# Patient Record
Sex: Female | Born: 1990
Health system: Southern US, Community
[De-identification: ages and names within clinical notes are randomized; demographics above are authoritative.]

## PROBLEM LIST (undated history)

## (undated) ENCOUNTER — Inpatient Hospital Stay: Payer: Self-pay

## (undated) DIAGNOSIS — L509 Urticaria, unspecified: Secondary | ICD-10-CM

## (undated) DIAGNOSIS — F329 Major depressive disorder, single episode, unspecified: Secondary | ICD-10-CM

## (undated) DIAGNOSIS — F410 Panic disorder [episodic paroxysmal anxiety] without agoraphobia: Secondary | ICD-10-CM

## (undated) DIAGNOSIS — I080 Rheumatic disorders of both mitral and aortic valves: Secondary | ICD-10-CM

## (undated) DIAGNOSIS — F909 Attention-deficit hyperactivity disorder, unspecified type: Secondary | ICD-10-CM

## (undated) DIAGNOSIS — R002 Palpitations: Secondary | ICD-10-CM

## (undated) DIAGNOSIS — N200 Calculus of kidney: Secondary | ICD-10-CM

## (undated) DIAGNOSIS — I341 Nonrheumatic mitral (valve) prolapse: Secondary | ICD-10-CM

## (undated) DIAGNOSIS — F32A Depression, unspecified: Secondary | ICD-10-CM

## (undated) DIAGNOSIS — J45909 Unspecified asthma, uncomplicated: Secondary | ICD-10-CM

## (undated) DIAGNOSIS — F419 Anxiety disorder, unspecified: Secondary | ICD-10-CM

## (undated) HISTORY — DX: Panic disorder (episodic paroxysmal anxiety): F41.0

## (undated) HISTORY — PX: TYMPANOSTOMY TUBE PLACEMENT: SHX32

## (undated) HISTORY — DX: Palpitations: R00.2

## (undated) HISTORY — DX: Rheumatic disorders of both mitral and aortic valves: I08.0

## (undated) HISTORY — DX: Calculus of kidney: N20.0

## (undated) HISTORY — PX: WISDOM TOOTH EXTRACTION: SHX21

## (undated) HISTORY — DX: Major depressive disorder, single episode, unspecified: F32.9

## (undated) HISTORY — DX: Anxiety disorder, unspecified: F41.9

## (undated) HISTORY — DX: Attention-deficit hyperactivity disorder, unspecified type: F90.9

## (undated) HISTORY — DX: Urticaria, unspecified: L50.9

## (undated) HISTORY — DX: Depression, unspecified: F32.A

## (undated) HISTORY — DX: Nonrheumatic mitral (valve) prolapse: I34.1

---
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
## 2009-05-08 ENCOUNTER — Emergency Department: Payer: Self-pay | Admitting: Emergency Medicine

## 2010-03-04 ENCOUNTER — Other Ambulatory Visit: Payer: Self-pay | Admitting: Emergency Medicine

## 2010-08-19 ENCOUNTER — Ambulatory Visit: Payer: Self-pay | Admitting: General Practice

## 2010-10-16 ENCOUNTER — Ambulatory Visit: Payer: Self-pay | Admitting: Cardiology

## 2010-10-29 ENCOUNTER — Ambulatory Visit: Payer: Self-pay | Admitting: Cardiovascular Disease

## 2010-12-20 ENCOUNTER — Emergency Department: Payer: Self-pay | Admitting: Emergency Medicine

## 2011-02-16 ENCOUNTER — Ambulatory Visit: Payer: Self-pay | Admitting: General Practice

## 2011-02-24 ENCOUNTER — Encounter: Payer: Self-pay | Admitting: Cardiovascular Disease

## 2011-02-24 ENCOUNTER — Ambulatory Visit (INDEPENDENT_AMBULATORY_CARE_PROVIDER_SITE_OTHER): Payer: PRIVATE HEALTH INSURANCE | Admitting: Cardiovascular Disease

## 2011-02-24 VITALS — BP 130/76 | HR 84 | Ht 62.0 in | Wt 102.0 lb

## 2011-02-24 DIAGNOSIS — I059 Rheumatic mitral valve disease, unspecified: Secondary | ICD-10-CM

## 2011-02-24 DIAGNOSIS — I341 Nonrheumatic mitral (valve) prolapse: Secondary | ICD-10-CM

## 2011-02-24 DIAGNOSIS — R002 Palpitations: Secondary | ICD-10-CM | POA: Insufficient documentation

## 2011-02-24 DIAGNOSIS — I498 Other specified cardiac arrhythmias: Secondary | ICD-10-CM

## 2011-02-24 DIAGNOSIS — F419 Anxiety disorder, unspecified: Secondary | ICD-10-CM | POA: Insufficient documentation

## 2011-02-24 DIAGNOSIS — I471 Supraventricular tachycardia: Secondary | ICD-10-CM

## 2011-02-24 MED ORDER — PROPRANOLOL HCL 10 MG PO TABS: 10.0000 mg | ORAL_TABLET | Freq: Three times a day (TID) | ORAL | Status: DC | PRN

## 2011-02-24 NOTE — Patient Instructions (Signed)
START Propranolol 10mg  3 times a day AS NEEDED for palpitations. Your physician recommends that you schedule a follow-up appointment in: 2 months (04/2011) with Dr. Elease Hashimoto.

## 2011-02-24 NOTE — Assessment & Plan Note (Signed)
He presents with episodes of palpitations and also some shortness of breath. She had an echocardiogram performed at [facility] which revealed mild mitral valve prolapse with mild mitral regurgitation and mild aortic insufficiency. I have reviewed the echo myself and I think that her valvular insufficiencies are actually very mild. She also has very minimal mitral valve prolapse.  She takes Adderall and drinks a lot of caffeinated beverages. She also eats a lot of chocolate. I think a lot of her palpitations are due to excessive caffeine, Adderall, and sugar. I asked her to decrease her intake of caffeine and the sugar. I've asked her to check with her medical doctor to see if she can use something other than Adderall.  I've written her a prescription for propranolol 10 mg to use on an as-needed basis. We'll check a TSH today.  I'll see her again in several months for a followup visit.

## 2011-02-24 NOTE — Progress Notes (Signed)
Katelyn Whitehead Date of Birth  02-May-1991 [facility] [address]     [address] [place], [place]  [address] [phone]  Fax  [phone]  History of Present Illness:  Episode of SVT 2 years ago.  Has paliptations frequently.   Anxious frequently.   Also has hx of hypokalemia.  Was exercising regularly until this episode of dyspnea - runs 3-4 miles on a treadmill.  Takes Adderall.  Palpitations are worse when she takes adderall.  She also eats lots of chocolate and drinks lots of caffeine.    Current Outpatient Prescriptions  Medication Sig Dispense Refill  . amphetamine-dextroamphetamine (ADDERALL, 10MG ,) 10 MG tablet Take 10 mg by mouth daily.        Marland Kitchen LORazepam (ATIVAN) 0.5 MG tablet Take 0.5 mg by mouth every 8 (eight) hours. Take 1/2 tablet daily       . propranolol (INDERAL) 10 MG tablet Take 1 tablet (10 mg total) by mouth 3 (three) times daily as needed.  30 tablet  12     Allergies  Allergen Reactions  . Sulfa Antibiotics     Rash    PMH:  Mitral valve prolapse  History reviewed. No pertinent past surgical history.  History  Smoking status  . Never Smoker   Smokeless tobacco  . Not on file    History  Alcohol Use No    History reviewed. No pertinent family history.  Reviw of Systems:  Reviewed in the HPI.  All other systems are negative.  Physical Exam: BP 130/76  Pulse 84  Ht 5\' 2"  (1.575 m)  Wt 102 lb (46.267 kg)  BMI 18.66 kg/m2 The patient is alert and oriented x 3.  The mood and affect are normal.   Skin: warm and dry.  Color is normal.    HEENT:   the sclera are nonicteric.  The mucous membranes are moist.  The carotids are 2+ without bruits.  There is no thyromegaly.  There is no JVD.    Lungs: clear.  The chest wall is non tender.    Heart: regular rate with a normal S1 and S2. She has a soft mitral valve click and a very soft systolic murmur. The PMI is not displaced.     Abdomin: good bowel  sounds.  There is no guarding or rebound.  There is no hepatosplenomegaly or tenderness.  There are no masses.   Extremities:  no clubbing, cyanosis, or edema.  The legs are without rashes.  The distal pulses are intact.   Neuro:  Cranial nerves II - XII are intact.  Motor and sensory functions are intact.    The gait is normal.  ECG: Normal sinus rhythm. She has no ST or T wave changes.  Assessment / Plan:

## 2011-03-01 ENCOUNTER — Other Ambulatory Visit: Payer: Self-pay | Admitting: Physician Assistant

## 2011-03-01 ENCOUNTER — Telehealth: Payer: Self-pay | Admitting: Cardiovascular Disease

## 2011-03-01 NOTE — Telephone Encounter (Signed)
Went to work and HR has elevated to the 140's.  Oxygen level was 92, has increased to 98.  Pt states that she had a low grade fever and diarrhea over the weekend.  Pt had her mother pick her up from work.

## 2011-03-01 NOTE — Telephone Encounter (Signed)
Notified patient of symptoms.  She has noticed some problems over the weekend with a fever/virus.  She has felt very bad today with her heart rate in the 140's. She has diarrhea and not feeling very good. Her heart rate now at home is in the 80's.  She is concerned if needs to be seen?

## 2011-03-01 NOTE — Telephone Encounter (Signed)
Notified patient to drink plenty of fluids to keep from getting dehydrated.  Told her it is possible that her heart rate is rapid due to her symptoms. Told patient not to take any of the propanolol because of her blood pressure reading so low. She is going to PCP now and will contact our office if any further symptoms of increased heart rate occur & to let us know what PCP said.

## 2011-03-01 NOTE — Telephone Encounter (Signed)
Over the weekend she had diarrhea fever, pounding in chest blood pressure yesterday 105/73, today 90/60 she had not taken any of the propanolol because of blood pressure running so low. She does feel like can't take a deep breath.  Her temp today is 99.8.  She has an appointment at 4:00 with her pcp. Do you want to see her or wait to find out what pcp said.

## 2011-03-08 ENCOUNTER — Ambulatory Visit: Payer: Self-pay | Admitting: Physician Assistant

## 2011-03-12 ENCOUNTER — Telehealth: Payer: Self-pay | Admitting: *Deleted

## 2011-03-12 ENCOUNTER — Telehealth: Payer: Self-pay | Admitting: Physician Assistant

## 2011-03-12 NOTE — Telephone Encounter (Signed)
Pt had 48hr Holter, has turned it in, very concerned abt results, was told MD to look at it and call her. Will try to facilitate that.

## 2011-03-13 NOTE — Telephone Encounter (Signed)
I do not have access to the holter at this point.  In reviewing the telephone call notes, she has had a viral illness with fever and HR up to 140.  I suspect we will find sinus tachycardia on the monitor.  Agree with holding Propranolol in that circumstance as the tachycardia is due to the viral illness

## 2011-03-15 ENCOUNTER — Telehealth: Payer: Self-pay | Admitting: *Deleted

## 2011-03-15 NOTE — Telephone Encounter (Signed)
Katelyn Whitehead seen in office with Dr. Elease Whitehead 02/24/11 for palpitations, she was started on Propranolol 10mg  tid prn. Katelyn Whitehead unable to take as her SBP is consistently 90 and she states this drops after taking along with symptoms of low BP. She recently had holter done at [facility] by self referral, she states she "wanted to be sure no irregular rhythm was noted." I received report read by Dr. Lady Gary at [facility] with findings: Sinus rhythm with rare PVCs and events associated with sinus tach and SR. He recc she continue Inderal as prescribed.   I have notified Katelyn Whitehead of this report. She is very concerned with her sx of "feel like wind is knocked out of me," and she has sx daily. Katelyn Whitehead has stopped her Adderall and caffeine which was r/t sx at last visit. She has started Celexa for anxiety and states this has helped anxiety, but palpitations continue. I notified her that holter readings were benign, and show no need for intervention, however, will discuss continued symptoms with Dr. Elease Whitehead for any other recommendation. Dr. Elease Whitehead, Katelyn Whitehead has f/u with you in [facility] 05/07/11. Please advise.

## 2011-03-16 NOTE — Telephone Encounter (Signed)
Attempted to contact pt, LMOM TCB.  

## 2011-03-16 NOTE — Telephone Encounter (Signed)
Spoke to pt, notified of below. She will f/u in Sept and will call sooner with any changes.

## 2011-03-16 NOTE — Telephone Encounter (Signed)
I agree with your recommendations.  Holter is benign.  She may try increasing her potassium intake to see if that helps.  I will see her on her apt. In Sept. Or I can see her earlier in our [place] office if needed

## 2011-03-18 NOTE — Telephone Encounter (Signed)
Opened in error

## 2011-03-19 ENCOUNTER — Ambulatory Visit: Payer: Self-pay | Admitting: General Practice

## 2011-04-14 ENCOUNTER — Telehealth: Payer: Self-pay | Admitting: *Deleted

## 2011-04-14 NOTE — Telephone Encounter (Signed)
Pt had left msg on VM regarding recurrent palpitations. Attempted to contact pt, LMOM TCB. She has f/u with Dr. Elease Hashimoto 04/23/11. Will discuss symptoms further when pt returns call.

## 2011-04-19 DIAGNOSIS — I10 Essential (primary) hypertension: Secondary | ICD-10-CM

## 2011-04-23 ENCOUNTER — Ambulatory Visit (INDEPENDENT_AMBULATORY_CARE_PROVIDER_SITE_OTHER): Payer: PRIVATE HEALTH INSURANCE | Admitting: Cardiovascular Disease

## 2011-04-23 ENCOUNTER — Encounter: Payer: Self-pay | Admitting: Cardiovascular Disease

## 2011-04-23 VITALS — BP 100/66 | HR 78 | Ht 62.0 in | Wt 106.0 lb

## 2011-04-23 DIAGNOSIS — R002 Palpitations: Secondary | ICD-10-CM

## 2011-04-23 NOTE — Patient Instructions (Signed)
Drink a can of V8 juice 2-3 times a week.  This will help bring up her potassium level and will also increased the centimeter.  Take a teaspoon of Mylanta every night or every other night for magnesium supplementation.

## 2011-04-23 NOTE — Progress Notes (Signed)
Katelyn Whitehead Date of Birth  [birth year] [facility] [address]     [address] [place], [place]  [address] [phone]  Fax  [phone]  History of Present Illness:  Katelyn Whitehead is a 20 year old female with history of palpitations. I saw her several months ago. She's feeling a little bit better but is still having some episodes of fast heart rate.  Still has occasional episodes where she feels like the breath been  knocked out of her.   Overall she's feeling better than when I last saw her. She has not tried to take a propranolol tablet because her blood pressure has been somewhat. She has been able to do all of her normal activities without any significant problems.  Unfortunately she is not working right now. She's on short-term disability because of her anxiety problems. She's trying some various medications in an effort to control her anxiety.  Current Outpatient Prescriptions on File Prior to Visit  Medication Sig Dispense Refill  . citalopram (CELEXA) 10 MG tablet Take (10 mg) three tablets daily      Xanax as needed  Allergies  Allergen Reactions  . Sulfa Antibiotics     Rash    Past Medical History  Diagnosis Date  . Anxiety   . Palpitations     Past Surgical History  Procedure Date  . Wisdom tooth extraction     History  Smoking status  . Never Smoker   Smokeless tobacco  . Not on file    History  Alcohol Use No    History reviewed. No pertinent family history.  Reviw of Systems:  Reviewed in the HPI.  All other systems are negative.  Physical Exam: BP 100/66  Pulse 78  Ht 5\' 2"  (1.575 m)  Wt 106 lb (48.081 kg)  BMI 19.39 kg/m2 The patient is alert and oriented x 3.  The mood and affect are normal.   Skin: warm and dry.  Color is normal.    HEENT:   the sclera are nonicteric.  The mucous membranes are moist.  The carotids are 2+ without bruits.  There is no thyromegaly.  There is no JVD.    Lungs: clear.   She slightly tender in her midsternum.  Heart: regular rate with a normal S1 and S2.  There are no murmurs, gallops, or rubs. The PMI is not displaced.     Abdomen: good bowel sounds.  There is no guarding or rebound.  There is no hepatosplenomegaly or tenderness.  There are no masses.   Extremities:  no clubbing, cyanosis, or edema.  The legs are without rashes.  The distal pulses are intact.   Neuro:  Cranial nerves II - XII are intact.  Motor and sensory functions are intact.    The gait is normal.  ECG: Normal sinus rhythm. She has no ST or T wave changes. Assessment / Plan:

## 2011-04-23 NOTE — Assessment & Plan Note (Addendum)
I think that she's having some premature ventricular contractions. I've recommended that she drink V8 once or twice a week.   This will open up her potassium.   She also can take some Mylanta which will help with her magnesium level.  I will  her again in 6 months sooner if needed. We  will consider getting a 30 day monitor she continues to have palpitations  after supplementing her potassium and magnesium.

## 2011-05-05 ENCOUNTER — Encounter: Payer: Self-pay | Admitting: Cardiovascular Disease

## 2011-05-07 ENCOUNTER — Ambulatory Visit: Payer: PRIVATE HEALTH INSURANCE | Admitting: Cardiovascular Disease

## 2012-03-01 ENCOUNTER — Ambulatory Visit: Payer: PRIVATE HEALTH INSURANCE | Admitting: Cardiovascular Disease

## 2012-04-05 ENCOUNTER — Ambulatory Visit: Payer: PRIVATE HEALTH INSURANCE | Admitting: Cardiovascular Disease

## 2012-05-16 ENCOUNTER — Ambulatory Visit: Payer: PRIVATE HEALTH INSURANCE | Admitting: Cardiovascular Disease

## 2012-05-31 ENCOUNTER — Ambulatory Visit: Payer: PRIVATE HEALTH INSURANCE | Admitting: Cardiovascular Disease

## 2012-06-29 ENCOUNTER — Ambulatory Visit (INDEPENDENT_AMBULATORY_CARE_PROVIDER_SITE_OTHER): Payer: BC Managed Care – PPO | Admitting: Cardiovascular Disease

## 2012-06-29 ENCOUNTER — Encounter: Payer: Self-pay | Admitting: Cardiovascular Disease

## 2012-06-29 VITALS — BP 102/60 | HR 81 | Ht 62.0 in | Wt 126.2 lb

## 2012-06-29 DIAGNOSIS — R002 Palpitations: Secondary | ICD-10-CM

## 2012-06-29 MED ORDER — METOPROLOL SUCCINATE ER 25 MG PO TB24: 25.0000 mg | ORAL_TABLET | Freq: Every day | ORAL | Status: DC

## 2012-06-29 NOTE — Assessment & Plan Note (Signed)
Katelyn Whitehead has had problems with sinus tachycardia as well as premature ventricular contractions. We've not found any documented supraventricular tachycardia.  She's interested in stopping the Paxil but is concerned that this will make her heart rate go up because of her anxiety.  We'll try her on Toprol-XL 25 mg a day. I've given her the okay to break these in half and take one half tablet a day if the full tablet  causes her  blood pressure too drop too much.  I'll see her again in 3 months for followup office visit and EKG.

## 2012-06-29 NOTE — Patient Instructions (Addendum)
Your physician wants you to follow-up in: 3 months with Dr. Elease Hashimoto. You will receive a reminder letter in the mail two months in advance. If you don't receive a letter, please call our office to schedule the follow-up appointment.  Your physician has recommended you make the following change in your medication:  - start Toprol XL 25 mg daily

## 2012-06-29 NOTE — Progress Notes (Signed)
Katelyn Whitehead Date of Birth  [birth year] [facility] [address]     [address] [place], [place]  [address] [phone]  Fax  [phone]  Problems: 1. PVCs 2. anxiety 3.    History of Present Illness:  Katelyn Whitehead is a 21 year old female with history of palpitations. I saw her several months ago. She's feeling a little bit better but is still having some episodes of fast heart rate.  Still has occasional episodes where she feels like the breath been  knocked out of her.   Overall she's feeling better than when I last saw her. She has not tried to take a propranolol tablet because her blood pressure has been somewhat. She has been able to do all of her normal activities without any significant problems.  She is back working at [employer]. She continues to have problems with anxiety. She states that the anxiety causes her heart rate to go up to the 110-120 range. She also has palpitations that sound clinically like premature ventricular contractions.  She has not been exercising on any regular basis. She started taking Paxil and as a result she's gained 20 pounds since I last saw her.. She works 12 hour  hifts and so is typically not able to exercise when she is working.  She would like to stop the Paxil is possible but she's worried that her heart rate might go too fast.    Current Outpatient Prescriptions on File Prior to Visit  Medication Sig Dispense Refill  . FLUoxetine (PROZAC) 20 MG capsule Take 20 mg by mouth daily.      Xanax as needed  Allergies  Allergen Reactions  . Sulfa Antibiotics     Rash    Past Medical History  Diagnosis Date  . Anxiety   . Palpitations     Past Surgical History  Procedure Date  . Wisdom tooth extraction     History  Smoking status  . Never Smoker   Smokeless tobacco  . Not on file    History  Alcohol Use No    History reviewed. No pertinent family history.  Reviw of Systems:    Reviewed in the HPI.  All other systems are negative.  Physical Exam: BP 102/60  Pulse 81  Ht 5\' 2"  (1.575 m)  Wt 126 lb 4 oz (57.267 kg)  BMI 23.09 kg/m2 The patient is alert and oriented x 3.  The mood and affect are normal.   Skin: warm and dry.  Color is normal.    HEENT:   the sclera are nonicteric.  The mucous membranes are moist.  The carotids are 2+ without bruits.  There is no thyromegaly.  There is no JVD.    Lungs: clear.  She slightly tender in her midsternum.  Heart: regular rate with a normal S1 and S2.  There is a very soft systolic murmur.   I do not hear a diastolic murmur.     Abdomen: good bowel sounds.  There is no guarding or rebound.  There is no hepatosplenomegaly or tenderness.  There are no masses.   Extremities:  no clubbing, cyanosis, or edema.  The legs are without rashes.  The distal pulses are intact.   Neuro:  Cranial nerves II - XII are intact.  Motor and sensory functions are intact.    The gait is normal.  ECG: 06/29/2012-normal sinus rhythm at 80 beats a minute. She has no ST or T wave changes. Assessment /  Plan:

## 2012-10-12 ENCOUNTER — Ambulatory Visit: Payer: BC Managed Care – PPO | Admitting: Cardiovascular Disease

## 2012-10-25 ENCOUNTER — Encounter: Payer: Self-pay | Admitting: *Deleted

## 2013-04-02 ENCOUNTER — Ambulatory Visit: Payer: BC Managed Care – PPO | Admitting: Physician Assistant

## 2013-04-05 ENCOUNTER — Ambulatory Visit (INDEPENDENT_AMBULATORY_CARE_PROVIDER_SITE_OTHER): Payer: BC Managed Care – PPO | Admitting: Physician Assistant

## 2013-04-05 ENCOUNTER — Encounter: Payer: Self-pay | Admitting: Physician Assistant

## 2013-04-05 VITALS — BP 96/68 | HR 82 | Ht 62.0 in | Wt 128.5 lb

## 2013-04-05 DIAGNOSIS — I341 Nonrheumatic mitral (valve) prolapse: Secondary | ICD-10-CM

## 2013-04-05 DIAGNOSIS — R0609 Other forms of dyspnea: Secondary | ICD-10-CM

## 2013-04-05 DIAGNOSIS — R079 Chest pain, unspecified: Secondary | ICD-10-CM

## 2013-04-05 DIAGNOSIS — F411 Generalized anxiety disorder: Secondary | ICD-10-CM

## 2013-04-05 DIAGNOSIS — F419 Anxiety disorder, unspecified: Secondary | ICD-10-CM

## 2013-04-05 DIAGNOSIS — R002 Palpitations: Secondary | ICD-10-CM

## 2013-04-05 DIAGNOSIS — I359 Nonrheumatic aortic valve disorder, unspecified: Secondary | ICD-10-CM

## 2013-04-05 DIAGNOSIS — I059 Rheumatic mitral valve disease, unspecified: Secondary | ICD-10-CM

## 2013-04-05 DIAGNOSIS — I351 Nonrheumatic aortic (valve) insufficiency: Secondary | ICD-10-CM

## 2013-04-05 NOTE — Progress Notes (Signed)
Patient ID: Katelyn Whitehead, female   DOB: [birth year], 22 y.o.   MRN: [id number]            Date:  04/05/2013   ID:  Katelyn Whitehead, DOB 1991/01/22, MRN [id number]  PCP:  Katelyn Conn, MD (per patient) Primary Cardiologist:  Katelyn Right, MD   History of Present Illness:  Katelyn Whitehead is a 22 y.o. female w/ PMHx s/f palpitations (sinus tachycardia, PVCs on Holter monitoring 2012) and anxiety who presents for routine follow-up.   She works at [employer] as a Lawyer.. She last followed up with Dr. Elease Hashimoto 06/2012. She had been recently started on Paxil 20mg  for anxiety (? panic disorder, SSRI). She had gained 20 lbs with this and was contemplating coming off of it, but was concerned about developing tachy-palpitations. She continued to have episodes of fast HRs and feeling as if the wind had been knocked out of her. Anxiety noted to increase her HR to 110-120 range. She had PRN propanolol for these episodes, but had not taken it due to labile BPs. She was started on Toprol-XL 12.5-25mg  based on BP.   Holter monitor 03/2011 showed predominantly sinus rhythm, lowest rate 46 bpm (sinus brady) fasted 175 (? sinus tach), the latter of which correlated to palpitations. Occasional PVCs were also noted.   2D echo 02/2011: EF 60%, mild-mod AI, mild MR, + mild MVP  Of note, she has a Whitehead axis deviation on serial EKGs since 02/2011.   She continues to experience persistent palpitations described as "fluttering" multiple times per day exacerbated by exertion, anxiety and also occurring at random. These seem to be getting worse. She does drink 2 cups of coffee per day and soda. Denies EtOH, illicit drug use, OTC meds, dietary supplements. No tobacco abuse. No fevers, chills. She did have evidence of hematuria at a recent PCP visit, but is going for a repeat u/a. She has not taken metoprolol due to labile BPs.   She endorses a history of DOE x 2 months. She denies LE edema, orthopnea, PND. She has gained 20 lbs  since taking SSRIs. She admits to limited exercise as well.   She notes an isolated, fleeting episode of sharp lower substernal/epigastric chest pain lasting for several seconds and remitting spontaneously. She attributed this to indigestion. She denies exertional chest discomfort or dyspnea.   EKG: NSR, 82 bpm, RAD, no ST/T change  Wt Readings from Last 3 Encounters:  04/05/13 128 lb 8 oz (58.287 kg)  06/29/12 126 lb 4 oz (57.267 kg)  04/23/11 106 lb (48.081 kg)     Past Medical History  Diagnosis Date  . Anxiety   . Palpitations     Current Outpatient Prescriptions  Medication Sig Dispense Refill  . hydrOXYzine (ATARAX/VISTARIL) 25 MG tablet Take 25 mg by mouth 2 (two) times daily as needed for itching.      . sertraline (ZOLOFT) 50 MG tablet Take 50 mg by mouth daily.       No current facility-administered medications for this visit.    Allergies:    Allergies  Allergen Reactions  . Sulfa Antibiotics     Rash    Social History:  The patient  reports that she has never smoked. She does not have any smokeless tobacco history on file. She reports that she does not drink alcohol or use illicit drugs.   Family History:  Family History  Problem Relation Age of Onset  . Family history unknown: Yes    Review of Systems:  General: negative for chills, fever, night sweats or weight changes.  Cardiovascular: positive for chest pain, dyspnea on exertion, palpitations, negative for edema, orthopnea, paroxysmal nocturnal dyspnea Dermatological: negative for rash Respiratory: negative for cough or wheezing Urologic: negative for hematuria Abdominal: negative for nausea, vomiting, diarrhea, bright red blood per rectum, melena, or hematemesis Neurologic: negative for visual changes, syncope, or dizziness All other systems reviewed and are otherwise negative except as noted above.  PHYSICAL EXAM: VS:  BP 96/68  Pulse 82  Ht 5\' 2"  (1.575 m)  Wt 128 lb 8 oz (58.287 kg)  BMI 23.5  kg/m2 Well nourished, well developed, in no acute distress HEENT: normal, PERRL Neck: no JVD or bruits Cardiac:  normal S1, crisp S2; RRR; no click, murmur or gallops Lungs:  clear to auscultation bilaterally, no wheezing, rhonchi or rales Abd: soft, nontender, no hepatomegaly, normoactive BS x 4 quads Ext: no edema, cyanosis or clubbing Skin: warm and dry, cap refill < 2 sec Neuro:  CNs 2-12 intact, no focal abnormalities noted Musculoskeletal: strength and tone appropriate for age  Psych: normal affect

## 2013-04-05 NOTE — Assessment & Plan Note (Signed)
Fleeting, isolated episode described as sharp and epigastric attributed to indigestion. Non-exertional. +MVP on echo. Will repeat echo. May represent anxiety as well.

## 2013-04-05 NOTE — Assessment & Plan Note (Signed)
Mild-moderate on 2012 echo. Will repeat echo to reassess.

## 2013-04-05 NOTE — Addendum Note (Signed)
Addended by: Rhea Belton R on: 04/05/2013 04:17 PM   Modules accepted: Orders

## 2013-04-05 NOTE — Assessment & Plan Note (Signed)
Mild on 2012 echo. Will repeat echo.

## 2013-04-05 NOTE — Assessment & Plan Note (Addendum)
She reports worsening DOE x 2 months. Denies LE edema, PND, orthopnea. She had evidence of mild-mod AI, mild MR on prior echo in 2012. Will repeat echocardiogram to further assess AV, MV, R-sided pressure and PA pressure. Question bicuspid AV morphology. Of note, her mother also has a "leaky" valve. Will also plan stress echo. Alternatively, this may represent deconditioning. The patient has gained ~ 20 lbs over several months. She has not found time to exercise. Exercise and weight loss have been advised.

## 2013-04-05 NOTE — Assessment & Plan Note (Addendum)
The patient's palpitations have persisted and seemingly become more frequent. She reports minimal associated symptoms- no lightheadedness or syncope. Holter monitoring in the past has revealed sinus tachycardia/bradycardia and PVCs. These episodes are exacerbated by exertion, but moreso by anxiety. Will plan to check CBC, CMET, TSH, free T4 to r/o secondary causes. Will plan to undergo repeat 48 Holter monitoring. AVN blockers limited by labile BPs- 96/68 today. Advised limiting caffeine intake.

## 2013-04-05 NOTE — Assessment & Plan Note (Signed)
Minimal relief with SSRIs. She reports significant improvement with benzodiazepine therapy. She follows up with a PCP and psychiatrist. Discussed considering the addition of BZD PRN for breakthrough anxiety.

## 2013-04-05 NOTE — Patient Instructions (Addendum)
We will plan to repeat an echocardiogram, then perform a stress (treadmill) echocardiogram.   We will check labwork- blood counts, electrolytes, kidney function and thyroid studies.   We will broach the topic of anxiety management with your primary care provider.   Please limit your caffeine intake to 2-3 cups per day.  Please follow-up in 2 months in October.   Order sent to [facility] for a 48-hr holter monitor.  They will call you from [facility] to set up a time to have that put on.

## 2013-04-06 LAB — CBC WITH DIFFERENTIAL/PLATELET
Basophils Absolute: 0 10*3/uL (ref 0.0–0.2)
Eosinophils Absolute: 0.2 10*3/uL (ref 0.0–0.4)
Immature Grans (Abs): 0 10*3/uL (ref 0.0–0.1)
Immature Granulocytes: 0 % (ref 0–2)
Lymphs: 31 % (ref 14–46)
MCH: 27.3 pg (ref 26.6–33.0)
MCHC: 32.3 g/dL (ref 31.5–35.7)
Neutrophils Relative %: 52 % (ref 40–74)
RBC: 5.05 x10E6/uL (ref 3.77–5.28)
RDW: 13.7 % (ref 12.3–15.4)

## 2013-04-06 LAB — COMPREHENSIVE METABOLIC PANEL
ALT: 15 IU/L (ref 0–32)
AST: 23 IU/L (ref 0–40)
Albumin/Globulin Ratio: 1.7 (ref 1.1–2.5)
CO2: 20 mmol/L (ref 18–29)
Calcium: 9.4 mg/dL (ref 8.7–10.2)
Potassium: 4.2 mmol/L (ref 3.5–5.2)
Sodium: 141 mmol/L (ref 134–144)

## 2013-04-06 LAB — TSH: TSH: 0.534 u[IU]/mL (ref 0.450–4.500)

## 2013-04-10 ENCOUNTER — Telehealth: Payer: Self-pay

## 2013-04-10 NOTE — Telephone Encounter (Signed)
Left detailed message on pt's voicemail that labs were normal & asked pt to call with any questions or concerns.

## 2013-04-10 NOTE — Telephone Encounter (Signed)
Message copied by Marilynne Halsted on Tue Apr 10, 2013 10:09 AM ------      Message from: Odella Aquas A      Created: Tue Apr 10, 2013  8:57 AM       Labs are all within normal limits. Please update patient. Thx ------

## 2013-04-13 ENCOUNTER — Ambulatory Visit: Payer: Self-pay | Admitting: Family Medicine

## 2013-04-17 ENCOUNTER — Ambulatory Visit: Payer: Self-pay | Admitting: Family Medicine

## 2013-04-17 ENCOUNTER — Telehealth: Payer: Self-pay

## 2013-04-17 ENCOUNTER — Other Ambulatory Visit: Payer: Self-pay

## 2013-04-17 DIAGNOSIS — F419 Anxiety disorder, unspecified: Secondary | ICD-10-CM

## 2013-04-17 DIAGNOSIS — R0609 Other forms of dyspnea: Secondary | ICD-10-CM

## 2013-04-17 DIAGNOSIS — R002 Palpitations: Secondary | ICD-10-CM

## 2013-04-17 DIAGNOSIS — R079 Chest pain, unspecified: Secondary | ICD-10-CM

## 2013-04-17 NOTE — Telephone Encounter (Signed)
Told patient to come today to have the holter monitor placed by our office.

## 2013-04-17 NOTE — Telephone Encounter (Signed)
Pt states she has not heard anything regarding her holter monitor. Please call.

## 2013-04-20 ENCOUNTER — Other Ambulatory Visit (INDEPENDENT_AMBULATORY_CARE_PROVIDER_SITE_OTHER): Payer: BC Managed Care – PPO

## 2013-04-20 ENCOUNTER — Telehealth: Payer: Self-pay

## 2013-04-20 ENCOUNTER — Other Ambulatory Visit: Payer: Self-pay

## 2013-04-20 DIAGNOSIS — R002 Palpitations: Secondary | ICD-10-CM

## 2013-04-20 DIAGNOSIS — R0609 Other forms of dyspnea: Secondary | ICD-10-CM

## 2013-04-20 DIAGNOSIS — I359 Nonrheumatic aortic valve disorder, unspecified: Secondary | ICD-10-CM

## 2013-04-20 NOTE — Telephone Encounter (Signed)
lmom 

## 2013-04-20 NOTE — Telephone Encounter (Signed)
Message copied by Marilynne Halsted on Fri Apr 20, 2013  5:27 PM ------      Message from: Odella Aquas A      Created: Fri Apr 20, 2013  4:43 PM       Normal echo. Mild MR, otherwise no valvular abnormalities noted. EF preserved. Please update patient. ------

## 2013-05-07 ENCOUNTER — Telehealth: Payer: Self-pay | Admitting: *Deleted

## 2013-05-07 NOTE — Telephone Encounter (Signed)
Called wanting the holtor monitor results

## 2013-05-10 ENCOUNTER — Telehealth: Payer: Self-pay

## 2013-05-10 NOTE — Telephone Encounter (Signed)
Spoke w/ pt. She is aware of results and will keep appt with Dr. Elease Hashimoto on 05/29/13.

## 2013-05-10 NOTE — Telephone Encounter (Signed)
Spoke w/ pt. She is aware of results and will keep appt with Dr. Nahser on 05/29/13. 

## 2013-05-29 ENCOUNTER — Ambulatory Visit: Payer: BC Managed Care – PPO | Admitting: Cardiovascular Disease

## 2013-06-13 ENCOUNTER — Ambulatory Visit (INDEPENDENT_AMBULATORY_CARE_PROVIDER_SITE_OTHER): Payer: BC Managed Care – PPO | Admitting: Cardiovascular Disease

## 2013-06-13 ENCOUNTER — Encounter: Payer: Self-pay | Admitting: Cardiovascular Disease

## 2013-06-13 VITALS — BP 98/64 | HR 81 | Ht 62.0 in | Wt 134.0 lb

## 2013-06-13 DIAGNOSIS — I471 Supraventricular tachycardia, unspecified: Secondary | ICD-10-CM

## 2013-06-13 DIAGNOSIS — I498 Other specified cardiac arrhythmias: Secondary | ICD-10-CM

## 2013-06-13 DIAGNOSIS — I351 Nonrheumatic aortic (valve) insufficiency: Secondary | ICD-10-CM

## 2013-06-13 DIAGNOSIS — R079 Chest pain, unspecified: Secondary | ICD-10-CM

## 2013-06-13 DIAGNOSIS — R002 Palpitations: Secondary | ICD-10-CM

## 2013-06-13 DIAGNOSIS — I359 Nonrheumatic aortic valve disorder, unspecified: Secondary | ICD-10-CM

## 2013-06-13 NOTE — Assessment & Plan Note (Signed)
Her most recent 30 day monitor revealed no significant arrhythmias.

## 2013-06-13 NOTE — Assessment & Plan Note (Signed)
He has done extremely well. She's been walking on her treadmill a daily basis. She's not had any significant limitations. She does have mild aortic insufficiency and mild mitral regurgitation which should not bother her. I've reassured her that these are relatively benign conditions.  I've encouraged her to continue her exercise regimen. I'll see her again in 6 months for followup office visit.

## 2013-06-13 NOTE — Progress Notes (Signed)
Katelyn Whitehead Date of Birth  05/16/1991 [facility] [address]     [address] [place], [place]  [id number] [phone]  Fax  [phone]  Problems: 1. PVCs 2. anxiety 3. mild aortic insufficiency 4. Mild mitral regurgitation  History of Present Illness:  Katelyn Whitehead is a 22 year old female with history of palpitations. I saw her several months ago. She's feeling a little bit better but is still having some episodes of fast heart rate.  Still has occasional episodes where she feels like the breath been  knocked out of her.   Overall she's feeling better than when I last saw her. She has not tried to take a propranolol tablet because her blood pressure has been somewhat. She has been able to do all of her normal activities without any significant problems.  She is back working at [employer]. She continues to have problems with anxiety. She states that the anxiety causes her heart rate to go up to the 110-120 range. She also has palpitations that sound clinically like premature ventricular contractions.  She has not been exercising on any regular basis. She started taking Paxil and as a result she's gained 20 pounds since I last saw her.. She works 12 hour  hifts and so is typically not able to exercise when she is working.  She would like to stop the Paxil is possible but she's worried that her heart rate might go too fast.  Nov. 5 2014:  braxton has done very well since I last saw her with Alinda Money a month ago. She had an echocardiogram which revealed mild mitral regurgitation and mild aortic insufficiency. She has normal left ventricular systolic function with an ejection fraction of around 55%.  She's been exercising on regular basis .  No current outpatient prescriptions on file prior to visit.   No current facility-administered medications on file prior to visit.  Xanax as needed  Allergies  Allergen Reactions  . Sulfa Antibiotics     Rash     Past Medical History  Diagnosis Date  . Anxiety   . Palpitations     Past Surgical History  Procedure Laterality Date  . Wisdom tooth extraction      History  Smoking status  . Never Smoker   Smokeless tobacco  . Not on file    History  Alcohol Use No    Family History  Problem Relation Age of Onset  . Family history unknown: Yes    Reviw of Systems:  Reviewed in the HPI.  All other systems are negative.  Physical Exam: BP 98/64  Pulse 81  Ht 5\' 2"  (1.575 m)  Wt 134 lb (60.782 kg)  BMI 24.50 kg/m2 The patient is alert and oriented x 3.  The mood and affect are normal.   Skin: warm and dry.  Color is normal.    HEENT:   the sclera are nonicteric.  The mucous membranes are moist.  The carotids are 2+ without bruits.  There is no thyromegaly.  There is no JVD.    Lungs: clear.  She slightly tender in her midsternum.  Heart: regular rate with a normal S1 and S2.  There is a very soft systolic murmur.   I do not hear a diastolic murmur.     Abdomen: good bowel sounds.  There is no guarding or rebound.  There is no hepatosplenomegaly or tenderness.  There are no masses.   Extremities:  no clubbing, cyanosis, or edema.  The legs are without rashes.  The distal pulses are intact.   Neuro:  Cranial nerves II - XII are intact.  Motor and sensory functions are intact.    The gait is normal.  ECG: Nov. 5, 2014:  NSR at 81.  Assessment / Plan:

## 2013-06-13 NOTE — Patient Instructions (Signed)
Your physician wants you to follow-up in: 6 MONTHS WITH DR. NAHSER IN THE [place] OFFICE. You will receive a reminder letter in the mail two months in advance. If you don't receive a letter, please call our office to schedule the follow-up appointment.   NO CHANGES WERE MADE TODAY 

## 2013-06-13 NOTE — Assessment & Plan Note (Signed)
Her aortic insufficiency appears to be stable. She has a trileaflet aortic valve.

## 2013-09-17 ENCOUNTER — Telehealth: Payer: Self-pay | Admitting: *Deleted

## 2013-09-17 NOTE — Telephone Encounter (Signed)
Patient felt sharp pain in top left chest area like a "stab". It only hurts when she moves and then goes away. It moves sharply from breast bone to collar bone. It makes her feel short of breath. She thinks it is fine but just wants the doctor to know just incase.    Spoke with Dr. Elease HashimotoNahser via telephone. He instructed me to tell patient that it is more than likely not cardiac pain and probably muscular pain. Patient instructed to rest that part of her body and let us know if the issue does not resolve. Patient verbalized understanding. Patient also instructed to seek emergency services if she feels that she is in emergent situation.

## 2013-09-17 NOTE — Telephone Encounter (Signed)
Patient called and when when she exerts energy she is getting a sharpe pain across her chest. Please advise

## 2013-09-28 ENCOUNTER — Other Ambulatory Visit: Payer: Self-pay | Admitting: Family Medicine

## 2013-09-28 LAB — BASIC METABOLIC PANEL
Anion Gap: 6 — ABNORMAL LOW (ref 7–16)
BUN: 14 mg/dL (ref 7–18)
CO2: 28 mmol/L (ref 21–32)
Calcium, Total: 8.8 mg/dL (ref 8.5–10.1)
Chloride: 101 mmol/L (ref 98–107)
Creatinine: 0.6 mg/dL (ref 0.60–1.30)
EGFR (Non-African Amer.): >60
GLUCOSE: 77 mg/dL (ref 65–99)
OSMOLALITY: 269 (ref 275–301)
Potassium: 3.6 mmol/L (ref 3.5–5.1)
Sodium: 135 mmol/L — ABNORMAL LOW (ref 136–145)

## 2013-10-02 ENCOUNTER — Other Ambulatory Visit: Payer: Self-pay | Admitting: Family Medicine

## 2013-10-02 LAB — URINALYSIS, COMPLETE
Bacteria: NONE SEEN
Bilirubin,UR: NEGATIVE
Glucose,UR: NEGATIVE mg/dL (ref 0–75)
Ketone: NEGATIVE
Leukocyte Esterase: NEGATIVE
NITRITE: NEGATIVE
PH: 6 (ref 4.5–8.0)
Protein: NEGATIVE
Specific Gravity: 1.024 (ref 1.003–1.030)
WBC UR: 1 /HPF (ref 0–5)

## 2013-10-02 LAB — PROTEIN, URINE, 24 HOUR
Collection Hours: 24 hours
Protein, Urine: <5 mg/dL — ABNORMAL LOW (ref 0–12)
TOTAL VOLUME C4TV(ML): 1000 mL

## 2013-11-06 ENCOUNTER — Emergency Department: Payer: Self-pay | Admitting: Emergency Medicine

## 2013-11-06 LAB — URINALYSIS, COMPLETE
BACTERIA: NONE SEEN
BILIRUBIN, UR: NEGATIVE
Glucose,UR: NEGATIVE mg/dL (ref 0–75)
Ketone: NEGATIVE
LEUKOCYTE ESTERASE: NEGATIVE
Nitrite: NEGATIVE
Ph: 6 (ref 4.5–8.0)
Protein: NEGATIVE
RBC,UR: 4 /HPF (ref 0–5)
SPECIFIC GRAVITY: 1.021 (ref 1.003–1.030)
WBC UR: NONE SEEN /HPF (ref 0–5)

## 2013-11-06 LAB — COMPREHENSIVE METABOLIC PANEL
ALT: 19 U/L (ref 12–78)
ANION GAP: 6 — AB (ref 7–16)
AST: 20 U/L (ref 15–37)
Albumin: 3.7 g/dL (ref 3.4–5.0)
Alkaline Phosphatase: 66 U/L
BUN: 14 mg/dL (ref 7–18)
Bilirubin,Total: 0.5 mg/dL (ref 0.2–1.0)
CHLORIDE: 103 mmol/L (ref 98–107)
CO2: 27 mmol/L (ref 21–32)
Calcium, Total: 8.3 mg/dL — ABNORMAL LOW (ref 8.5–10.1)
Creatinine: 0.71 mg/dL (ref 0.60–1.30)
EGFR (Non-African Amer.): >60
Glucose: 86 mg/dL (ref 65–99)
OSMOLALITY: 272 (ref 275–301)
Potassium: 3.3 mmol/L — ABNORMAL LOW (ref 3.5–5.1)
Sodium: 136 mmol/L (ref 136–145)
Total Protein: 7.4 g/dL (ref 6.4–8.2)

## 2013-11-06 LAB — CBC
HCT: 40.8 % (ref 35.0–47.0)
HGB: 13.4 g/dL (ref 12.0–16.0)
MCH: 27.6 pg (ref 26.0–34.0)
MCHC: 32.9 g/dL (ref 32.0–36.0)
MCV: 84 fL (ref 80–100)
PLATELETS: 231 10*3/uL (ref 150–440)
RBC: 4.85 10*6/uL (ref 3.80–5.20)
RDW: 13.8 % (ref 11.5–14.5)
WBC: 8.6 10*3/uL (ref 3.6–11.0)

## 2013-11-06 LAB — TROPONIN I: Troponin-I: <0.02 ng/mL

## 2013-11-06 LAB — MAGNESIUM: Magnesium: 1.5 mg/dL — ABNORMAL LOW

## 2013-11-07 ENCOUNTER — Encounter: Payer: Self-pay | Admitting: Physician Assistant

## 2013-11-07 ENCOUNTER — Ambulatory Visit (INDEPENDENT_AMBULATORY_CARE_PROVIDER_SITE_OTHER): Payer: 59 | Admitting: Physician Assistant

## 2013-11-07 ENCOUNTER — Encounter: Payer: Self-pay | Admitting: *Deleted

## 2013-11-07 VITALS — BP 106/66 | HR 84 | Ht 62.0 in | Wt 128.5 lb

## 2013-11-07 DIAGNOSIS — I959 Hypotension, unspecified: Secondary | ICD-10-CM

## 2013-11-07 DIAGNOSIS — R197 Diarrhea, unspecified: Secondary | ICD-10-CM

## 2013-11-07 DIAGNOSIS — R079 Chest pain, unspecified: Secondary | ICD-10-CM

## 2013-11-07 DIAGNOSIS — E860 Dehydration: Secondary | ICD-10-CM | POA: Insufficient documentation

## 2013-11-07 DIAGNOSIS — E876 Hypokalemia: Secondary | ICD-10-CM | POA: Insufficient documentation

## 2013-11-07 NOTE — Assessment & Plan Note (Signed)
Secondary to diarrhea, loose stools. Not staying hydrated at work. Largely attributing to hypotension and resultant lightheadedness. Improved with IVFs. Advised to stay well-hydrated while at work. Supportive measures for possible viral gastroenteritis. BRAT diet to bulk stools. Intermittent salt supplementation to maintain consistent intravascular volume.

## 2013-11-07 NOTE — Assessment & Plan Note (Signed)
Suspect reflux mediated. Advise antacids or H2 blockers for initial relief. If persists, try PPI next.

## 2013-11-07 NOTE — Assessment & Plan Note (Addendum)
Contributing to dehydration->hypotension->lightheadedness as above. Hypokalemia and hypomagnesemia in the ED likely from GI losses. Recommend supportive therapy for possible viral gastroenteritis, staying well hydrated, BRAT diet and Imodium PRN.

## 2013-11-07 NOTE — Assessment & Plan Note (Signed)
As above. Due to diarrhea, decreased fluid intake at work and resultant dehydration. She has soft BPs at baseline, and suspect even a small dip in BP will elicit symptoms. Her SBP was in the 80s in the ED. This improved with IVF hydration. Advised to reman well-hydrated and supplement intermittently with small amounts of salt.

## 2013-11-07 NOTE — Assessment & Plan Note (Signed)
Secondary to GI losses from diarrhea. Advised to consume dark leafy greens or supplement with MagOx prescribed in the ED yesterday. Supportive care for diarrhea.

## 2013-11-07 NOTE — Assessment & Plan Note (Signed)
Evident in the ED yesterday. Secondary to diarrhea. Advised to increase potassium in diet or proceed with KCl supplementation prescribed after her ED visit.

## 2013-11-07 NOTE — Progress Notes (Signed)
Patient ID: Katelyn Whitehead, female   DOB: October 11, 1990, 23 y.o.   MRN: [id number]   Date:  11/07/2013   ID:  Alfredo Bach, DOB 08-Jun-1991, MRN [id number]  PCP:  Nira Conn, MD  Primary Cardiologist:  Katherina Right, MD   History of Present Illness:  Katelyn Whitehead is a 23 y.o. female w/ PMHx s/f palpitations (sinus tachycardia, PVCs on Holter monitoring 2012) and anxiety who presents for routine follow-up.   I saw her in follow-up 03/2013 for tachy-palpitations felt to be largely related to anxiety. Holter monitoring then, and previously, revealed sinus tachycardia, otherwise no specific arrhythmias. She underwent a 2D echo 04/2013 revealing EF 50-55%, mild AI, mild MR, no evidence of MVP (previously reported per patient). No other structural cardiac changes. She was seen 06/2013 by Dr. Elease Hashimoto. Changes on echo determined to be benign. She started on exercise regimen due to endorsing ~20 lbs weight gain after starting Paxil for anxiety. She was tolerating this well.   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 K and Mg were low at 3.3 and 1.5, respectively. EKG was within normal limits. Glucose 80s. CXR- no acute abnormalities. Pulse 98, RR 20, SpO2 98% on RA. BP 90/64. Known soft BPs at baseline. Orthostatic VS in the ED revealed 88/56 lying, 103/58 sitting, 101/61 standing; HR 87-93-94 suggesting an appropriate response. U/a unremarkable. She received 1L of IVF and IV MgSO4. She was discharged and follow-up w/ our office arranged for today.   Of note, labwork including CMET, CBC w/ diff, TSH and free T4 obtained in 03/2013 were all completely within normal limits.  She reports experiencing loose stools/diarrhea for the past couple of days. She believes she contracted a "GI bug" from a patient. She admits to not staying well hydrated at work, especially recently. She did take her Klonopin the night prior to her ED presentation. She denies  palpitations, increase stress. No new meds, new diet or dietary supplements.  She does note sharp intermittent chest pains associated with belching which she attributes to indigestion. Denies unilateral leg swelling, redness, tenderness or OCP use.   EKG: NSR, RAD (unchanged from prior tracings), no ST/T changes, normal intervals, good R wave progression, no evidence of arrhythmia or ectopy  Wt Readings from Last 3 Encounters:  11/07/13 128 lb 8 oz (58.287 kg)  06/13/13 134 lb (60.782 kg)  04/05/13 128 lb 8 oz (58.287 kg)     Past Medical History  Diagnosis Date  . Anxiety   . Palpitations     Current Outpatient Prescriptions  Medication Sig Dispense Refill  . citalopram (CELEXA) 40 MG tablet Take 40 mg by mouth daily.       . clonazePAM (KLONOPIN) 0.5 MG tablet Take 0.25 mg by mouth daily.        No current facility-administered medications for this visit.   Allergies:    Allergies  Allergen Reactions  . Sulfa Antibiotics     Rash   Social History:  The patient  reports that she has never smoked. She does not have any smokeless tobacco history on file. She reports that she does not drink alcohol or use illicit drugs.   Family History:  Family History  Problem Relation Age of Onset  . Family history unknown: Yes   Review of Systems: General: negative for chills, fever, night sweats or weight changes.  Cardiovascular: negative for chest pain, dyspnea on exertion, edema, orthopnea, palpitations, paroxysmal nocturnal dyspnea or shortness  of breath Dermatological: negative for rash Respiratory:  negative for cough or wheezing Urologic: negative for hematuria Abdominal: positive for diarrhea/loose stools, nausea, negative for vomiting, diarrhea, bright red blood per rectum, melena, or hematemesis Neurologic: positive for lightheadedness, negative for visual changes, syncope All other systems reviewed and are otherwise negative except as noted above.  PHYSICAL EXAM: VS:  Ht  5\' 2"  (1.575 m)  Wt 128 lb 8 oz (58.287 kg)  BMI 23.50 kg/m2 Well nourished, well developed, in no acute distress HEENT: normal, PERRL Neck: no JVD or bruits Cardiac:  normal S1, S2; RRR; no murmur or gallops Lungs:  clear to auscultation bilaterally, no wheezing, rhonchi or rales Abd: soft, nontender, no hepatomegaly, normoactive BS x 4 quads Ext: no edema, cyanosis or clubbing Skin: warm and dry, cap refill < 2 sec Neuro:  CNs 2-12 intact, no focal abnormalities noted Musculoskeletal: strength and tone appropriate for age  Psych: normal affect

## 2013-11-07 NOTE — Patient Instructions (Addendum)
Please stay well hydrated and supplement with small amounts of salt intermittently.   Please increase the amount of potassium and magnesium in your diet-- dark leafy greens, bananas, beans, peppers, etc. You may continue to use the supplements provide by the emergency.    You may want to try compression stockings while at work if the symptoms persist as you have soft blood pressure to begin with.   If you continue to have diarrhea, it is especially important to stay well-hydrated. You can try eating bananas, rice, apple sauce and toast to help bulk the stool. Immodium also helps with this.   Continue to monitor symptoms on Klonopin and address side effects to the prescribing provider.  We will see you again as needed.

## 2013-12-12 ENCOUNTER — Other Ambulatory Visit: Payer: Self-pay | Admitting: Family Medicine

## 2013-12-12 LAB — POTASSIUM: Potassium: 3.5 mmol/L (ref 3.5–5.1)

## 2013-12-12 LAB — MAGNESIUM: Magnesium: 2 mg/dL

## 2014-03-11 ENCOUNTER — Telehealth: Payer: Self-pay

## 2014-03-11 NOTE — Telephone Encounter (Signed)
Patient states she is having chest pain in the middle of her chest  She states is radiates to her back Denies sob or any other s  She works in the ED and gave herself an EKG  She stated the ED MD told her it was normal   I discussed this with Ward Givenshris Berge NP  He agrees that patient can either check in at the ED or follow up with PCP  Patient verbalized understanding  She stated she is working in the ED all day and will see how she feels

## 2014-03-11 NOTE — Telephone Encounter (Signed)
Pt states she has had chest pressure, in between breast bone, sharp pain burning, has radiated to back. Please call.

## 2014-03-11 NOTE — Telephone Encounter (Signed)
Attempted to call patient x 2.

## 2014-09-27 ENCOUNTER — Ambulatory Visit: Payer: Self-pay | Admitting: Nurse Practitioner

## 2014-12-04 DIAGNOSIS — F331 Major depressive disorder, recurrent, moderate: Secondary | ICD-10-CM | POA: Insufficient documentation

## 2014-12-04 DIAGNOSIS — F410 Panic disorder [episodic paroxysmal anxiety] without agoraphobia: Secondary | ICD-10-CM | POA: Insufficient documentation

## 2015-02-04 ENCOUNTER — Ambulatory Visit: Payer: 59 | Admitting: Psychiatry

## 2015-03-08 ENCOUNTER — Ambulatory Visit
Admission: RE | Admit: 2015-03-08 | Discharge: 2015-03-08 | Disposition: A | Discharge status: Home / Self Care | Payer: 59 | Source: Ambulatory Visit | Attending: Emergency Medicine | Admitting: Emergency Medicine

## 2015-03-08 ENCOUNTER — Other Ambulatory Visit: Payer: Self-pay | Admitting: Emergency Medicine

## 2015-03-08 DIAGNOSIS — M79672 Pain in left foot: Secondary | ICD-10-CM

## 2015-03-18 ENCOUNTER — Encounter: Payer: Self-pay | Admitting: Psychiatry

## 2015-03-18 ENCOUNTER — Ambulatory Visit (INDEPENDENT_AMBULATORY_CARE_PROVIDER_SITE_OTHER): Payer: 59 | Admitting: Psychiatry

## 2015-03-18 VITALS — BP 118/62 | HR 78 | Temp 98.0°F | Ht 62.0 in | Wt 141.0 lb

## 2015-03-18 DIAGNOSIS — F900 Attention-deficit hyperactivity disorder, predominantly inattentive type: Secondary | ICD-10-CM

## 2015-03-18 DIAGNOSIS — F410 Panic disorder [episodic paroxysmal anxiety] without agoraphobia: Secondary | ICD-10-CM

## 2015-03-18 DIAGNOSIS — F988 Other specified behavioral and emotional disorders with onset usually occurring in childhood and adolescence: Secondary | ICD-10-CM

## 2015-03-18 MED ORDER — CITALOPRAM HYDROBROMIDE 40 MG PO TABS: 40.0000 mg | ORAL_TABLET | Freq: Every day | ORAL | Status: DC

## 2015-03-18 MED ORDER — AMPHETAMINE-DEXTROAMPHETAMINE 10 MG PO TABS: 10.0000 mg | ORAL_TABLET | ORAL | Status: DC

## 2015-03-18 NOTE — Progress Notes (Signed)
BH MD/PA/NP OP Progress Note  03/18/2015 4:05 PM Katelyn Whitehead  MRN:  [id number]  Subjective:   Patient is a 24 year old married female who presented for the follow-up appointment. She reported that she missed her appointment in May and has been running out of her medications. She is back to school now. Patient reported that she has been compliant with her citalopram and is taking it on a regular basis. Patient reported that she takes Adderall on a when necessary basis. She also has been taking Klonopin as needed. Patient reported that she has gained some weight and is conscious about the same. She is trying to exercise more on a regular basis. She currently denied having any suicidal ideations or plans. 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 She currently denied having any suicidal ideations or plans. She appeared calm and cooperative during the interview.  Chief Complaint:  Chief Complaint    Follow-up; Anxiety     Visit Diagnosis:     ICD-9-CM ICD-10-CM   1. Panic disorder 300.01 F41.0   2. ADD (attention deficit disorder) without hyperactivity 314.00 F90.0     Past Medical History:  Past Medical History  Diagnosis Date  . Anxiety   . Palpitations   . Mitral valve prolapse   . Aortic valve insufficiency   . ADHD (attention deficit hyperactivity disorder)   . Depression     Past Surgical History  Procedure Laterality Date  . Wisdom tooth extraction     Family History:  Family History  Problem Relation Age of Onset  . Depression Mother   . Drug abuse Father   . ADD / ADHD Brother   . Depression Maternal Aunt   . Dementia Maternal Grandmother   . Depression Maternal Grandmother    Social History:  History   Social History  . Marital Status: Married    Spouse Name: N/A  . Number of Children: N/A  . Years of Education: N/A   Social History Main Topics  .  Smoking status: Never Smoker   . Smokeless tobacco: Not on file  . Alcohol Use: No  . Drug Use: No  . Sexual Activity: Not on file   Other Topics Concern  . Not on file   Social History Narrative   Additional History:  Currently married and lives with her husband and has good relationship with him.  Assessment:   Musculoskeletal: Strength & Muscle Tone: within normal limits Gait & Station: normal Patient leans: N/A  Psychiatric Specialty Exam: HPI  ROS  There were no vitals taken for this visit.There is no weight on file to calculate BMI.  General Appearance: Casual and Fairly Groomed  Eye Contact:  Fair  Speech:  Clear and Coherent  Volume:  Normal  Mood:  Euthymic  Affect:  Congruent  Thought Process:  Coherent  Orientation:  Full (Time, Place, and Person)  Thought Content:  WDL  Suicidal Thoughts:  No  Homicidal Thoughts:  No  Memory:  Immediate;   Fair  Judgement:  Fair  Insight:  Fair  Psychomotor Activity:  Normal  Concentration:  Fair  Recall:  [employer] of Knowledge: Fair  Language: Fair  Akathisia:  No  Handed:  Right  AIMS (if indicated):    Assets:  Communication Skills Desire for Improvement Physical Health  ADL's:  Intact  Cognition: WNL  Sleep:  7  Is the patient at risk to self?  No. Has the patient been a risk to self in the past 6 months?  No. Has the patient been a risk to self within the distant past?  No. Is the patient a risk to others?  No. Has the patient been a risk to others in the past 6 months?  No. Has the patient been a risk to others within the distant past?  No.  Current Medications: Current Outpatient Prescriptions  Medication Sig Dispense Refill  . amphetamine-dextroamphetamine (ADDERALL) 10 MG tablet Take 1 tablet by mouth every morning.    . citalopram (CELEXA) 40 MG tablet Take 40 mg by mouth daily.     . clonazePAM (KLONOPIN) 0.5 MG tablet Take 0.25 mg by mouth daily.      No current facility-administered  medications for this visit.    Medical Decision Making:  Established Problem, Stable/Improving (1) and Review of Psycho-Social Stressors (1)  Treatment Plan Summary:Medication management Continued on Adderall 10 mg when necessary and was given 2 month supply of the medication. She also takes Celexa 40 mg daily for her anxiety and panic symptoms. She takes Klonopin on a when necessary basis and has supply of the medication. Will follow-up in 3 months or earlier.  Brandy Hale 03/18/2015, 4:05 PM

## 2015-04-26 ENCOUNTER — Emergency Department
Admission: EM | Admit: 2015-04-26 | Discharge: 2015-04-26 | Disposition: A | Discharge status: Home / Self Care | Payer: 59 | Attending: Emergency Medicine | Admitting: Emergency Medicine

## 2015-04-26 ENCOUNTER — Emergency Department: Payer: 59

## 2015-04-26 DIAGNOSIS — R0602 Shortness of breath: Secondary | ICD-10-CM | POA: Diagnosis present

## 2015-04-26 DIAGNOSIS — Z79899 Other long term (current) drug therapy: Secondary | ICD-10-CM | POA: Diagnosis not present

## 2015-04-26 DIAGNOSIS — J40 Bronchitis, not specified as acute or chronic: Secondary | ICD-10-CM | POA: Insufficient documentation

## 2015-04-26 NOTE — ED Notes (Signed)
Patient transported to X-ray 

## 2015-04-26 NOTE — Discharge Instructions (Signed)

## 2015-04-26 NOTE — ED Notes (Signed)
Pt c/o SOB and left chest pain when taking deep breaths. +cough x 1 month. Pt has been on antibiotics and prednisone with no improvement.

## 2015-04-26 NOTE — ED Provider Notes (Signed)
[facility] Emergency Department Provider Note ____________________________________________  Time seen: 1939  I have reviewed the triage vital signs and the nursing notes.  HISTORY  Chief Complaint  Shortness of Breath  HPI Katelyn Whitehead is a 24 y.o. female reports to the ED with complaints of continued shortness of breath with the inspiration, and left chest pain also aggravated by inspiration. She is been coughing for about a month. She recently was seen and treated at the employee clinic with her second round of antibiotics, prednisone, and albuterol inhaler. Reports improvement to the cough overall, but notes over the last 2 days chest wall pain with deep inspiration. She denies any interim fever, chills, or sweats.  Past Medical History  Diagnosis Date  . Anxiety   . Palpitations   . Mitral valve prolapse   . Aortic valve insufficiency   . ADHD (attention deficit hyperactivity disorder)   . Depression     Patient Active Problem List   Diagnosis Date Noted  . ADD (attention deficit disorder) 12/04/2014  . Depression, major, recurrent, moderate 12/04/2014  . Episodic paroxysmal anxiety disorder 12/04/2014  . Dehydration 11/07/2013  . Diarrhea 11/07/2013  . Hypotension 11/07/2013  . Hypomagnesemia 11/07/2013  . Hypokalemia 11/07/2013  . Dyspnea on exertion 04/05/2013  . Aortic insufficiency 04/05/2013  . Chest pain 04/05/2013  . Palpitations 02/24/2011  . Mitral valve prolapse 02/24/2011  . Anxiety 02/24/2011  . Supraventricular tachycardia 02/24/2011    Past Surgical History  Procedure Laterality Date  . Wisdom tooth extraction      Current Outpatient Rx  Name  Route  Sig  Dispense  Refill  . amphetamine-dextroamphetamine (ADDERALL) 10 MG tablet   Oral   Take 1 tablet (10 mg total) by mouth every morning.   30 tablet   0     To be filled 04/18/15   . citalopram (CELEXA) 40 MG tablet   Oral   Take 1 tablet (40 mg total) by mouth  daily.   90 tablet   1   . clonazePAM (KLONOPIN) 0.5 MG tablet   Oral   Take 0.25 mg by mouth daily.           Allergies Sulfa antibiotics  Family History  Problem Relation Age of Onset  . Depression Mother   . Drug abuse Father   . ADD / ADHD Brother   . Depression Maternal Aunt   . Dementia Maternal Grandmother   . Depression Maternal Grandmother     Social History Social History  Substance Use Topics  . Smoking status: Never Smoker   . Smokeless tobacco: Never Used  . Alcohol Use: No   Review of Systems  Constitutional: Negative for fever. Eyes: Negative for visual changes. ENT: Negative for sore throat. Cardiovascular: Negative for chest pain. Respiratory: Negative for shortness of breath. Gastrointestinal: Negative for abdominal pain, vomiting and diarrhea. Genitourinary: Negative for dysuria. Musculoskeletal: Negative for back pain. Reports chest wall pain. Skin: Negative for rash. Neurological: Negative for headaches, focal weakness or numbness. ____________________________________________  PHYSICAL EXAM:  VITAL SIGNS: ED Triage Vitals  Enc Vitals Group     BP 04/26/15 1945 107/73 mmHg     Pulse Rate 04/26/15 1945 73     Resp 04/26/15 1945 20     Temp 04/26/15 1945 98.2 F (36.8 C)     Temp Source 04/26/15 1945 Oral     SpO2 04/26/15 1945 98 %     Weight 04/26/15 1945 135 lb (61.236 kg)  Height 04/26/15 1945 5\' 2"  (1.575 m)     Head Cir --      Peak Flow --      Pain Score 04/26/15 1946 6     Pain Loc --      Pain Edu? --      Excl. in GC? --    Constitutional: Alert and oriented. Well appearing and in no distress. Eyes: Conjunctivae are normal. PERRL. Normal extraocular movements. ENT   Head: Normocephalic and atraumatic.   Nose: No congestion/rhinorrhea.   Mouth/Throat: Mucous membranes are moist.   Neck: Supple. No thyromegaly. Hematological/Lymphatic/Immunological: No cervical lymphadenopathy. Cardiovascular: Normal  rate, regular rhythm.  Respiratory: Normal respiratory effort. No wheezes/rales/rhonchi. Gastrointestinal: Soft and nontender. No distention. Musculoskeletal: Nontender with normal range of motion in all extremities.  Neurologic:  Normal gait without ataxia. Normal speech and language. No gross focal neurologic deficits are appreciated. Skin:  Skin is warm, dry and intact. No rash noted. Psychiatric: Mood and affect are normal. Patient exhibits appropriate insight and judgment. ____________________________________________   RADIOLOGY CXR IMPRESSION: No active cardiopulmonary disease. ____________________________________________  INITIAL IMPRESSION / ASSESSMENT AND PLAN / ED COURSE  Radiology results reviewed, reassurance to the patient about ongoing treatment for likely viral bronchitis. Suggest dosing the albuterol inhaler as prescribed. She may also use a previously prescribed Flexeril for musculoskeletal chest wall pain. Continue continued dosing Tylenol, and began ibuprofen once prednisone was completed. Follow with employee clinic for continued or worsening symptoms. ____________________________________________  FINAL CLINICAL IMPRESSION(S) / ED DIAGNOSES  Final diagnoses:  Bronchitis      Lissa Hoard, PA-C 04/26/15 1610  Phineas Semen, MD 04/27/15 0010

## 2015-06-17 ENCOUNTER — Ambulatory Visit: Payer: 59 | Admitting: Psychiatry

## 2015-06-26 ENCOUNTER — Encounter: Payer: Self-pay | Admitting: Psychiatry

## 2015-06-26 ENCOUNTER — Ambulatory Visit (INDEPENDENT_AMBULATORY_CARE_PROVIDER_SITE_OTHER): Payer: 59 | Admitting: Psychiatry

## 2015-06-26 VITALS — BP 100/58 | HR 80 | Temp 97.6°F | Ht 62.0 in | Wt 141.4 lb

## 2015-06-26 DIAGNOSIS — F410 Panic disorder [episodic paroxysmal anxiety] without agoraphobia: Secondary | ICD-10-CM | POA: Diagnosis not present

## 2015-06-26 MED ORDER — CITALOPRAM HYDROBROMIDE 40 MG PO TABS: 40.0000 mg | ORAL_TABLET | Freq: Every day | ORAL | Status: DC

## 2015-06-26 NOTE — Progress Notes (Signed)
BH MD/PA/NP OP Progress Note  06/26/2015 3:14 PM Katelyn Whitehead  MRN:  [id number]  Subjective:   Patient is a 24 year old married female who presented for the follow-up appointment. 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 She reported that her school closed without any notification. She reported that she was surprised as they only received an email from the torment. She stated that she was unable to finish her degree. Reported that it was a scam. Patient stated that she is  trying to become pregnant and has discussed with her husband and he is in agreement with the plan. She was discussing about her medications in detail. She reported that she only takes Klonopin on a when necessary basis and has anxiety issues and stated that Celexa is helping her with the same. She denied having any perceptual disturbances she denied having any suicidal ideations or plans. She is not planning to go back to school at this time. She feels that she has gained some weight and is exercising on a regular basis. She appeared calm and collective during the interview.w.  Chief Complaint:   Visit Diagnosis:     ICD-9-CM ICD-10-CM   1. Panic disorder 300.01 F41.0     Past Medical History:  Past Medical History  Diagnosis Date  . Anxiety   . Palpitations   . Mitral valve prolapse   . Aortic valve insufficiency   . ADHD (attention deficit hyperactivity disorder)   . Depression     Past Surgical History  Procedure Laterality Date  . Wisdom tooth extraction     Family History:  Family History  Problem Relation Age of Onset  . Depression Mother   . Drug abuse Father   . ADD / ADHD Brother   . Depression Maternal Aunt   . Dementia Maternal Grandmother   . Depression Maternal Grandmother    Social History:  Social History   Social History  . Marital Status: Married    Spouse Name: N/A  . Number of Children: N/A  . Years of Education: N/A   Social History Main Topics  .  Smoking status: Never Smoker   . Smokeless tobacco: Never Used  . Alcohol Use: No  . Drug Use: No  . Sexual Activity: Yes    Birth Control/ Protection: None   Other Topics Concern  . Not on file   Social History Narrative   Additional History:  Currently married and lives with her husband and has good relationship with him.  Assessment:   Musculoskeletal: Strength & Muscle Tone: within normal limits Gait & Station: normal Patient leans: N/A  Psychiatric Specialty Exam: HPI   ROS   There were no vitals taken for this visit.There is no weight on file to calculate BMI.  General Appearance: Casual and Fairly Groomed  Eye Contact:  Fair  Speech:  Clear and Coherent  Volume:  Normal  Mood:  Euthymic  Affect:  Congruent  Thought Process:  Coherent  Orientation:  Full (Time, Place, and Person)  Thought Content:  WDL  Suicidal Thoughts:  No  Homicidal Thoughts:  No  Memory:  Immediate;   Fair  Judgement:  Fair  Insight:  Fair  Psychomotor Activity:  Normal  Concentration:  Fair  Recall:  [employer]  Fund of Knowledge: Fair  Language: Fair  Akathisia:  No  Handed:  Right  AIMS (if indicated):    Assets:  Communication Skills Desire for Improvement Physical Health  ADL's:  Intact  Cognition: WNL  Sleep:  7   Is the patient at risk to self?  No. Has the patient been a risk to self in the past 6 months?  No. Has the patient been a risk to self within the distant past?  No. Is the patient a risk to others?  No. Has the patient been a risk to others in the past 6 months?  No. Has the patient been a risk to others within the distant past?  No.  Current Medications: Current Outpatient Prescriptions  Medication Sig Dispense Refill  . amphetamine-dextroamphetamine (ADDERALL) 10 MG tablet Take 1 tablet (10 mg total) by mouth every morning. 30 tablet 0  . citalopram (CELEXA) 40 MG tablet Take 1 tablet (40 mg total) by mouth daily. 90 tablet 1  . clonazePAM (KLONOPIN) 0.5 MG  tablet Take 0.25 mg by mouth daily.      No current facility-administered medications for this visit.    Medical Decision Making:  Established Problem, Stable/Improving (1) and Review of Psycho-Social Stressors (1)  Treatment Plan Summary:Medication management   Discontinue Klonopin and Adderall as patient is trying to become pregnant. Will continue with Celexa 40 mg daily for anxiety and depression and she agreed with the plan She will follow-up in 3 months or earlier depending on her symptoms   Advised patient to call if she notices worsening of her symptoms and she demonstrated understanding   Brandy Hale 06/26/2015, 3:14 PM

## 2015-07-10 NOTE — Progress Notes (Signed)
On 06-26-15 Discontinue Klonopin and Adderall as patient is trying to become pregnant. Will continue with Celexa 40 mg daily for anxiety and depression and she agreed with the plan.  Left message on voice mail pharmacy

## 2015-08-22 ENCOUNTER — Telehealth: Payer: Self-pay

## 2015-08-22 ENCOUNTER — Telehealth: Payer: Self-pay | Admitting: *Deleted

## 2015-08-22 NOTE — Telephone Encounter (Signed)
Pt calling stating she is been having some dizzy spells, and some lightheadedness and not sure what to do. Would like not to go to a Urgent care and does not have a PCP Denies SOB/CP Feels like its her sinus but not sure again would just like to come in When she has those spells her heart gets racing.  Also gets nauseous during those times as well. Did not go to work because of this.  Is willing to see anyone today  Also mentioned to her that Dr Elease HashimotoNahser is now out of [place] and she stated that was fine.  If okay she would like to see Dr Mariah MillingGollan.  Please advise.

## 2015-08-22 NOTE — Telephone Encounter (Signed)
Pt needs an appt for eval.

## 2015-08-22 NOTE — Telephone Encounter (Signed)
states that for the last couple of weeks she has panice attacks, , anxiety, and heart racing.  she had some xanax that she had before and has tried that to see if that helps but it has not.  Pt has appt for 09-04-15 to see dr. Garnetta BuddyFaheem.

## 2015-08-26 ENCOUNTER — Ambulatory Visit: Payer: 59 | Admitting: Nurse Practitioner

## 2015-08-26 NOTE — Telephone Encounter (Signed)
She has appt in this week. No changes in med. 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 She demonstrated understanding.

## 2015-09-02 ENCOUNTER — Encounter: Payer: 59 | Admitting: Physician Assistant

## 2015-09-02 NOTE — Progress Notes (Signed)
Cardiology Office Note Date:  09/02/2015  Patient ID:  Katelyn Whitehead, Katelyn Whitehead 30, 1992, MRN [id number] PCP:  Nira Conn, MD  Cardiologist:  Dr. Elease Hashimoto, MD  refresh   Chief Complaint:   History of Present Illness: Katelyn Whitehead is a 25 y.o. female with history of tachy-palpitations and anxiety who presents for ED follow up.   She has been seen multiple times for the same in years past, last on 11/07/2013. Prior work up includes Holter monitor in 2012 that showed sinus tachycardia and PVCs, Holter in 2014 showed sinus tachycardia, otherwise no significant arrythmia. She underwent a 2D echo 04/2013 revealing EF 50-55%, mild AI, mild MR, no evidence of MVP (previously reported per patient). No other structural cardiac changes. She was seen 06/2013 by Dr. Elease Hashimoto. Changes on echo determined to be benign. She started on exercise regimen due to endorsing ~20 lbs weight gain after starting Paxil for anxiety. 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 Found to be orthostatic. Called 03/2014 for chest pain, advised to go to the ED. Called 08/2015 with dizziness and lightheadedness. She did not want to go to urgent care and does have a PCP. She feels like its her sinuses. Some nausea and palpitations.    breathing palpitations chest pain   Past Medical History  Diagnosis Date  . Anxiety   . Palpitations   . Mitral valve prolapse   . Aortic valve insufficiency   . ADHD (attention deficit hyperactivity disorder)   . Depression     Past Surgical History  Procedure Laterality Date  . Wisdom tooth extraction      Current Outpatient Prescriptions  Medication Sig Dispense Refill  . citalopram (CELEXA) 40 MG tablet Take 1 tablet (40 mg total) by mouth daily. 90 tablet 1   No current facility-administered medications for this visit.    Allergies:   Sulfa antibiotics   Social History:  The patient  reports  that she has never smoked. She has never used smokeless tobacco. She reports that she does not drink alcohol or use illicit drugs.   Family History:  The patient's family history includes ADD / ADHD in her brother; Dementia in her maternal grandmother; Depression in her maternal aunt, maternal grandmother, and mother; Drug abuse in her father.  ROS:  Please see the history of present illness. Otherwise, review of systems is positive for .   All other systems are reviewed and otherwise negative.   PHYSICAL EXAM:  VS:  There were no vitals taken for this visit. BMI: There is no weight on file to calculate BMI. Well nourished, well developed, in no acute distress HEENT: normocephalic, atraumatic Neck: no JVD, carotid bruits or masses Cardiac:  normal S1, S2; RRR; no murmurs, rubs, or gallops Lungs:  clear to auscultation bilaterally, no wheezing, rhonchi or rales Abd: soft, nontender, no hepatomegaly, + BS MS: no deformity or atrophy Ext: no edema Skin: warm and dry, no rash Neuro:  moves all extremities spontaneously, no focal abnormalities noted, follows commands Psych: euthymic mood, full affect   EKG:  Was ordered today. Shows   Recent Labs: No results found for requested labs within last 365 days.  No results found for requested labs within last 365 days.   CrCl cannot be calculated (Unknown ideal weight.).   Wt Readings from Last 3 Encounters:  06/26/15 141 lb 6.4 oz (64.139 kg)  04/26/15 135 lb (61.236  kg)  03/18/15 141 lb (63.957 kg)     Other studies reviewed: Additional studies/records reviewed today include: summarized above  ASSESSMENT AND PLAN:  1. Anxiety: 2.   Disposition: F/u with   Current medicines are reviewed at length with the patient today.  The patient did not have any concerns regarding medicines.  Elinor Dodge PA-C 09/02/2015 8:27 AM     [facility] [place] [address] [place], [place] [address] [phone]  This encounter was created in error - please disregard.

## 2015-09-03 ENCOUNTER — Ambulatory Visit: Payer: Self-pay | Admitting: Physician Assistant

## 2015-09-04 ENCOUNTER — Ambulatory Visit: Payer: 59 | Admitting: Psychiatry

## 2015-09-10 ENCOUNTER — Ambulatory Visit: Payer: 59 | Admitting: Physician Assistant

## 2015-09-11 ENCOUNTER — Ambulatory Visit (INDEPENDENT_AMBULATORY_CARE_PROVIDER_SITE_OTHER): Payer: 59 | Admitting: Nurse Practitioner

## 2015-09-11 ENCOUNTER — Encounter: Payer: Self-pay | Admitting: Nurse Practitioner

## 2015-09-11 VITALS — BP 96/60 | HR 79 | Ht 62.0 in | Wt 143.5 lb

## 2015-09-11 DIAGNOSIS — R55 Syncope and collapse: Secondary | ICD-10-CM

## 2015-09-11 DIAGNOSIS — F410 Panic disorder [episodic paroxysmal anxiety] without agoraphobia: Secondary | ICD-10-CM

## 2015-09-11 DIAGNOSIS — R42 Dizziness and giddiness: Secondary | ICD-10-CM

## 2015-09-11 DIAGNOSIS — F419 Anxiety disorder, unspecified: Secondary | ICD-10-CM

## 2015-09-11 DIAGNOSIS — R002 Palpitations: Secondary | ICD-10-CM | POA: Diagnosis not present

## 2015-09-11 DIAGNOSIS — I080 Rheumatic disorders of both mitral and aortic valves: Secondary | ICD-10-CM | POA: Insufficient documentation

## 2015-09-11 DIAGNOSIS — R0602 Shortness of breath: Secondary | ICD-10-CM | POA: Diagnosis not present

## 2015-09-11 NOTE — Progress Notes (Signed)
Office Visit    Patient Name: KERIN KREN Date of Encounter: 09/11/2015  Primary Care Provider:  Nira Conn, MD Primary Cardiologist:  Previously - P. Nahser, MD   Chief Complaint    25 year old female with a history of panic attacks and palpitations who presents for follow-up related to worsening palpitations and presyncope.  Past Medical History    Past Medical History  Diagnosis Date  . Anxiety   . Palpitations     a. 2012 Holter: sinus tach, pvc's.  . Mild mitral and aortic regurgitation     a. 04/2013 Echo: EF 50-55%, mild AI, mild MR, no MVP.  Marland Kitchen ADHD (attention deficit hyperactivity disorder)   . Depression   . Panic disorder    Past Surgical History  Procedure Laterality Date  . Wisdom tooth extraction      Allergies  Allergies  Allergen Reactions  . Sulfa Antibiotics     Hives Rash    History of Present Illness    32 show female with a prior history of anxiety, panic attacks, palpitations, and presyncope. She has previously worn a Holter monitor which showed sinus tachycardia and rare PVCs. Previous echo showed mild AI and MR. She was last seen here in 2014. Over the past 6 months, she has noticed an increase in both her anxiety as well as palpitations. Although she feels as though she has been having more panic attacks, she does not feel the palpitations are related to or secondary to panic attacks. She says she frequently has spells where she suddenly feels lightheaded and notes her heart racing. This occurs almost daily. Symptoms last a few minutes and resolve spontaneously. 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 She sat down and had a coworker do a 12-lead ECG. This showed sinus tachycardia. Apparently her blood pressure was checked and it was in the 90s, which is normal for her. Symptoms resolved spontaneously. She denies chest pain or dyspnea. She has been trying to exercise more. No PND,  orthopnea, edema, or early satiety. She is follow-up with psychiatry soon.  Home Medications    Prior to Admission medications   Medication Sig Start Date End Date Taking? Authorizing Provider  citalopram (CELEXA) 40 MG tablet Take 1 tablet (40 mg total) by mouth daily. 06/26/15  Yes Brandy Hale, MD  clonazePAM (KLONOPIN) 0.5 MG tablet Take 0.25 mg by mouth daily as needed for anxiety.   Yes Historical Provider, MD    Review of Systems    As above, increasing palpitations and presyncope. Increasing anxiety and panic attacks. No chest pain.  All other systems reviewed and are otherwise negative except as noted above.  Physical Exam    VS:  BP 96/60 mmHg  Pulse 79  Ht  (1.575 m)  Wt 143 lb 8 oz (65.091 kg)  BMI 26.24 kg/m2 , BMI Body mass index is 26.24 kg/(m^2). GEN: Well nourished, well developed, in no acute distress. HEENT: normal. Neck: Supple, no JVD, carotid bruits, or masses. Cardiac: RRR, no murmurs, rubs, or gallops. No clubbing, cyanosis, edema.  Radials/DP/PT 2+ and equal bilaterally.  Respiratory:  Respirations regular and unlabored, clear to auscultation bilaterally. GI: Soft, nontender, nondistended, BS + x 4. MS: no deformity or atrophy. Skin: warm and dry, no rash. Neuro:  Strength and sensation are intact. Psych: Normal affect.  Accessory Clinical Findings    ECG - rsr, 79, r-ward axis, no acute st/t changes.  Assessment & Plan  1.  Pre-syncope and palpitations:  Pt has a h/o anxiety/panic attacks associated with palpitations and pre-syncope.  She has had an increase in all of these symptoms over that past six months, though she doesn't think that her anxiety and panic attacks are related to her palpitations and presyncope. Presyncopal spells occur at random and she does feel as though she may pass out. She does note increase in her heart rate. She had an ECG performed at work the other day which showed sinus tachycardia. She says that this was performed  at the end of the episode and she was already starting to feel better. She is interested in repeat Holter monitoring. She has a prior history of hypokalemia, I will repeat basic metabolic panel and also echocardiogram as it has been 3 years since her last one and she has known mild AI and MR. She has follow-up with psychiatry in the coming weeks. She does have propranolol at home and has only used it once. Because of her low blood pressure, it limits utilization. That said, if she is having sinus tachycardia secondary to anxiety and panic attacks, treatment of the underlying cause will be more important.  2. Anxiety with panic attacks: She does not think that her Celexa is working well and has been taking when necessary Klonopin. She has follow-up with psychiatry in the coming weeks.  3. Disposition: Basic metabolic panel and magnesium today. Follow-up echo and 48-hour Holter monitor-hopefully to provide reassurance. Follow-up when necessary pending testing.   Nicolasa Ducking, NP 09/11/2015, 9:05 AM

## 2015-09-11 NOTE — Patient Instructions (Signed)
Medication Instructions:  Please continue your current medications  Labwork: BMET, Magnesium  Testing/Procedures: Your physician has requested that you have an echocardiogram. Echocardiography is a painless test that uses sound waves to create images of your heart. It provides your doctor with information about the size and shape of your heart and how well your heart's chambers and valves are working. This procedure takes approximately one hour. There are no restrictions for this procedure.  Date & time: _____________________________________  Your physician has recommended that you wear a holter monitor. Holter monitors are medical devices that record the heart's electrical activity. Doctors most often use these monitors to diagnose arrhythmias. Arrhythmias are problems with the speed or rhythm of the heartbeat. The monitor is a small, portable device. You can wear one while you do your normal daily activities. This is usually used to diagnose what is causing palpitations/syncope (passing out).   Date & time: ___________________________  Follow-Up: Call or return to clinic prn if these symptoms worsen or fail to improve as anticipated.  If you need a refill on your cardiac medications before your next appointment, please call your pharmacy.

## 2015-09-12 LAB — BASIC METABOLIC PANEL
BUN/Creatinine Ratio: 23 — ABNORMAL HIGH (ref 8–20)
BUN: 17 mg/dL (ref 6–20)
CALCIUM: 9.4 mg/dL (ref 8.7–10.2)
CO2: 21 mmol/L (ref 18–29)
CREATININE: 0.74 mg/dL (ref 0.57–1.00)
Chloride: 102 mmol/L (ref 96–106)
GFR calc Af Amer: 129 mL/min/{1.73_m2} (ref >59)
GFR, EST NON AFRICAN AMERICAN: 112 mL/min/{1.73_m2} (ref >59)
Glucose: 95 mg/dL (ref 65–99)
POTASSIUM: 5.4 mmol/L — AB (ref 3.5–5.2)
Sodium: 145 mmol/L — ABNORMAL HIGH (ref 134–144)

## 2015-09-12 LAB — MAGNESIUM: Magnesium: 2.3 mg/dL (ref 1.6–2.3)

## 2015-09-15 ENCOUNTER — Other Ambulatory Visit: Payer: Self-pay

## 2015-09-15 DIAGNOSIS — R899 Unspecified abnormal finding in specimens from other organs, systems and tissues: Secondary | ICD-10-CM

## 2015-09-18 ENCOUNTER — Ambulatory Visit: Payer: 59 | Admitting: Psychiatry

## 2015-09-18 ENCOUNTER — Ambulatory Visit (INDEPENDENT_AMBULATORY_CARE_PROVIDER_SITE_OTHER): Payer: 59 | Admitting: Psychiatry

## 2015-09-18 ENCOUNTER — Ambulatory Visit: Payer: 59 | Admitting: Physician Assistant

## 2015-09-18 ENCOUNTER — Other Ambulatory Visit: Payer: Self-pay

## 2015-09-18 ENCOUNTER — Ambulatory Visit (INDEPENDENT_AMBULATORY_CARE_PROVIDER_SITE_OTHER): Payer: 59

## 2015-09-18 ENCOUNTER — Other Ambulatory Visit
Admission: RE | Admit: 2015-09-18 | Discharge: 2015-09-18 | Disposition: A | Discharge status: Home / Self Care | Payer: 59 | Source: Ambulatory Visit | Attending: Cardiovascular Disease | Admitting: Cardiovascular Disease

## 2015-09-18 ENCOUNTER — Encounter: Payer: Self-pay | Admitting: Psychiatry

## 2015-09-18 VITALS — BP 118/62 | HR 106 | Temp 97.8°F | Ht 62.0 in | Wt 145.0 lb

## 2015-09-18 DIAGNOSIS — F419 Anxiety disorder, unspecified: Secondary | ICD-10-CM | POA: Diagnosis not present

## 2015-09-18 DIAGNOSIS — R55 Syncope and collapse: Secondary | ICD-10-CM

## 2015-09-18 DIAGNOSIS — F410 Panic disorder [episodic paroxysmal anxiety] without agoraphobia: Secondary | ICD-10-CM | POA: Diagnosis not present

## 2015-09-18 DIAGNOSIS — F900 Attention-deficit hyperactivity disorder, predominantly inattentive type: Secondary | ICD-10-CM

## 2015-09-18 DIAGNOSIS — R899 Unspecified abnormal finding in specimens from other organs, systems and tissues: Secondary | ICD-10-CM | POA: Diagnosis not present

## 2015-09-18 DIAGNOSIS — R002 Palpitations: Secondary | ICD-10-CM

## 2015-09-18 DIAGNOSIS — F988 Other specified behavioral and emotional disorders with onset usually occurring in childhood and adolescence: Secondary | ICD-10-CM

## 2015-09-18 LAB — BASIC METABOLIC PANEL
ANION GAP: 7 (ref 5–15)
BUN: 15 mg/dL (ref 6–20)
CALCIUM: 9 mg/dL (ref 8.9–10.3)
CO2: 26 mmol/L (ref 22–32)
CREATININE: 0.61 mg/dL (ref 0.44–1.00)
Chloride: 105 mmol/L (ref 101–111)
Glucose, Bld: 98 mg/dL (ref 65–99)
Potassium: 3.7 mmol/L (ref 3.5–5.1)
SODIUM: 138 mmol/L (ref 135–145)

## 2015-09-18 MED ORDER — VILAZODONE HCL 10 MG PO TABS
10.0000 mg | ORAL_TABLET | Freq: Every day | ORAL | Status: DC
Start: 2015-09-18 — End: 2015-11-10

## 2015-09-18 MED ORDER — CLONAZEPAM 0.5 MG PO TABS: 0.5000 mg | ORAL_TABLET | Freq: Every day | ORAL | Status: DC | PRN

## 2015-09-18 MED ORDER — BUSPIRONE HCL 5 MG PO TABS: 5.0000 mg | ORAL_TABLET | Freq: Two times a day (BID) | ORAL | Status: DC

## 2015-09-18 MED ORDER — CITALOPRAM HYDROBROMIDE 10 MG PO TABS: 10.0000 mg | ORAL_TABLET | Freq: Every day | ORAL | Status: DC

## 2015-09-18 NOTE — Progress Notes (Signed)
BH MD/PA/NP OP Progress Note  09/18/2015 11:10 AM Katelyn Whitehead  MRN:  [id number]  Subjective:   Patient is a 25 year old married female who presented for the follow-up appointment. She reported that she continues to be anxious and is having panic attacks throughout the day. Patient reported that she does not feel better although she has started taking Klonopin on a regular basis from her old prescription. She also feels that Celexa has stopped working. She appeared apprehensive during the interview. Patient reported that she wants help with her medications. She reported that she is also trying to get pregnant but is worried about the effects of the medications. Patient reported that she wants her medications to be adjusted at this time. She was also asking about the weight gain related to the medications. We discussed about the medications options at length. She currently denied having any suicidal homicidal ideations or plans. She denied having any perceptual disturbances.    Chief Complaint:  Chief Complaint    Follow-up; Medication Refill; Anxiety; Panic Attack; Depression; Fatigue     Visit Diagnosis:     ICD-9-CM ICD-10-CM   1. Panic disorder 300.01 F41.0   2. ADD (attention deficit disorder) without hyperactivity 314.00 F90.0     Past Medical History:  Past Medical History  Diagnosis Date  . Anxiety   . Palpitations     a. 2012 Holter: sinus tach, pvc's.  . Mild mitral and aortic regurgitation     a. 04/2013 Echo: EF 50-55%, mild AI, mild MR, no MVP.  Marland Kitchen ADHD (attention deficit hyperactivity disorder)   . Depression   . Panic disorder     Past Surgical History  Procedure Laterality Date  . Wisdom tooth extraction     Family History:  Family History  Problem Relation Age of Onset  . Depression Mother   . Drug abuse Father   . ADD / ADHD Brother   . Depression Maternal Aunt   . Dementia Maternal Grandmother   . Depression Maternal Grandmother    Social History:   Social History   Social History  . Marital Status: Married    Spouse Name: N/A  . Number of Children: N/A  . Years of Education: N/A   Social History Main Topics  . Smoking status: Never Smoker   . Smokeless tobacco: Never Used  . Alcohol Use: No  . Drug Use: No  . Sexual Activity: Yes    Birth Control/ Protection: None   Other Topics Concern  . None   Social History Narrative   Additional History:  Currently married and lives with her husband and has good relationship with him.  Assessment:   Musculoskeletal: Strength & Muscle Tone: within normal limits Gait & Station: normal Patient leans: N/A  Psychiatric Specialty Exam: Anxiety    Depression        Past medical history includes anxiety.     Review of Systems  Psychiatric/Behavioral: Positive for depression.    Blood pressure 118/62, pulse 106, temperature 97.8 F (36.6 C), temperature source Tympanic, height  (1.575 m), weight 145 lb (65.772 kg), last menstrual period 09/03/2015, SpO2 99 %.Body mass index is 26.51 kg/(m^2).  General Appearance: Casual and Fairly Groomed  Eye Contact:  Fair  Speech:  Clear and Coherent  Volume:  Normal  Mood:  Anxious  Affect:  Congruent  Thought Process:  Coherent  Orientation:  Full (Time, Place, and Person)  Thought Content:  WDL  Suicidal Thoughts:  No  Homicidal Thoughts:  No  Memory:  Immediate;   Fair  Judgement:  Fair  Insight:  Fair  Psychomotor Activity:  Normal  Concentration:  Fair  Recall:  [employer] of Knowledge: Fair  Language: Fair  Akathisia:  No  Handed:  Right  AIMS (if indicated):    Assets:  Communication Skills Desire for Improvement Physical Health  ADL's:  Intact  Cognition: WNL  Sleep:  7   Is the patient at risk to self?  No. Has the patient been a risk to self in the past 6 months?  No. Has the patient been a risk to self within the distant past?  No. Is the patient a risk to others?  No. Has the patient been a risk to  others in the past 6 months?  No. Has the patient been a risk to others within the distant past?  No.  Current Medications: Current Outpatient Prescriptions  Medication Sig Dispense Refill  . citalopram (CELEXA) 40 MG tablet Take 1 tablet (40 mg total) by mouth daily. 90 tablet 1  . clonazePAM (KLONOPIN) 0.5 MG tablet Take 0.25 mg by mouth daily as needed for anxiety.     No current facility-administered medications for this visit.    Medical Decision Making:  Established Problem, Stable/Improving (1) and Review of Psycho-Social Stressors (1)  Treatment Plan Summary:Medication management   Discussed with patient what the medications treatment risks benefits and alternatives. I will start her on BuSpar 5 mg by mouth twice a day and she will continue taking it in combination with Klonopin 0.25 mg by mouth twice a day to control her anxiety symptoms. She will also be started on Viibryd 10 mg daily for 1 week and will then titrate the dose to 20 mg daily. Patient was given the samples of the medications. She will decrease the Celexa to 10 mg at bedtime and will then stop the medication when  she will titrate the Viibryd to 20 mg. Discussed with her about the adverse effects of the medication in  detail and she demonstrated understanding.  Discontinue Klonopin and Adderall as patient is trying to become pregnant. Will continue with Celexa 40 mg daily for anxiety and depression and she agreed with the plan She will follow-up in 3 months or earlier depending on her symptoms   Advised patient to call if she notices worsening of her symptoms and she demonstrated understanding   Brandy Hale, MD  09/18/2015, 11:10 AM

## 2015-09-25 ENCOUNTER — Ambulatory Visit: Payer: 59 | Admitting: Psychiatry

## 2015-09-26 ENCOUNTER — Ambulatory Visit (INDEPENDENT_AMBULATORY_CARE_PROVIDER_SITE_OTHER): Payer: 59

## 2015-09-26 ENCOUNTER — Other Ambulatory Visit: Payer: Self-pay

## 2015-09-26 DIAGNOSIS — F419 Anxiety disorder, unspecified: Secondary | ICD-10-CM | POA: Diagnosis not present

## 2015-09-26 DIAGNOSIS — R002 Palpitations: Secondary | ICD-10-CM | POA: Diagnosis not present

## 2015-09-26 DIAGNOSIS — R55 Syncope and collapse: Secondary | ICD-10-CM | POA: Diagnosis not present

## 2015-10-03 ENCOUNTER — Other Ambulatory Visit: Payer: Self-pay | Admitting: Nurse Practitioner

## 2015-10-03 ENCOUNTER — Telehealth: Payer: Self-pay | Admitting: Nurse Practitioner

## 2015-10-03 ENCOUNTER — Ambulatory Visit
Admission: RE | Admit: 2015-10-03 | Discharge: 2015-10-03 | Disposition: A | Discharge status: Home / Self Care | Payer: 59 | Source: Ambulatory Visit | Attending: Nurse Practitioner | Admitting: Nurse Practitioner

## 2015-10-03 DIAGNOSIS — R002 Palpitations: Secondary | ICD-10-CM

## 2015-10-03 NOTE — Telephone Encounter (Signed)
Left message on pt VM 

## 2015-10-03 NOTE — Telephone Encounter (Signed)
S/w Clydie Braun in Cardiopulmonary regarding pt 48 hour HM results. Per Clydie Braun, she tried to scan a few days ago and it would not. States she will try again today.

## 2015-10-03 NOTE — Telephone Encounter (Signed)
Patient wants results of 48 hr monitor .  Please call.

## 2015-10-06 ENCOUNTER — Telehealth: Payer: Self-pay | Admitting: *Deleted

## 2015-10-06 NOTE — Telephone Encounter (Signed)
Please call Katelyn Whitehead with her holter monitor results. Thanks!

## 2015-10-06 NOTE — Telephone Encounter (Signed)
These have not been resulted yet. Will call pt as soon as we get them.

## 2015-10-08 ENCOUNTER — Ambulatory Visit: Payer: Self-pay | Admitting: Family

## 2015-10-08 DIAGNOSIS — J019 Acute sinusitis, unspecified: Secondary | ICD-10-CM

## 2015-10-08 DIAGNOSIS — J069 Acute upper respiratory infection, unspecified: Secondary | ICD-10-CM

## 2015-10-09 NOTE — Progress Notes (Signed)
See note in paper chart by Heather Ratcliffe, PAC  

## 2015-10-15 DIAGNOSIS — Z3169 Encounter for other general counseling and advice on procreation: Secondary | ICD-10-CM | POA: Diagnosis not present

## 2015-10-15 DIAGNOSIS — Z1329 Encounter for screening for other suspected endocrine disorder: Secondary | ICD-10-CM | POA: Diagnosis not present

## 2015-10-15 DIAGNOSIS — Z124 Encounter for screening for malignant neoplasm of cervix: Secondary | ICD-10-CM | POA: Diagnosis not present

## 2015-10-15 DIAGNOSIS — Z01419 Encounter for gynecological examination (general) (routine) without abnormal findings: Secondary | ICD-10-CM | POA: Diagnosis not present

## 2015-10-16 DIAGNOSIS — Z1329 Encounter for screening for other suspected endocrine disorder: Secondary | ICD-10-CM | POA: Diagnosis not present

## 2015-10-16 DIAGNOSIS — Z124 Encounter for screening for malignant neoplasm of cervix: Secondary | ICD-10-CM | POA: Diagnosis not present

## 2015-10-16 DIAGNOSIS — Z01419 Encounter for gynecological examination (general) (routine) without abnormal findings: Secondary | ICD-10-CM | POA: Diagnosis not present

## 2015-10-16 DIAGNOSIS — Z3169 Encounter for other general counseling and advice on procreation: Secondary | ICD-10-CM | POA: Diagnosis not present

## 2015-11-10 ENCOUNTER — Ambulatory Visit (INDEPENDENT_AMBULATORY_CARE_PROVIDER_SITE_OTHER): Payer: 59 | Admitting: Psychiatry

## 2015-11-10 ENCOUNTER — Encounter: Payer: Self-pay | Admitting: Psychiatry

## 2015-11-10 VITALS — BP 118/82 | HR 103 | Temp 97.6°F | Ht 62.0 in | Wt 146.6 lb

## 2015-11-10 DIAGNOSIS — F410 Panic disorder [episodic paroxysmal anxiety] without agoraphobia: Secondary | ICD-10-CM | POA: Diagnosis not present

## 2015-11-10 MED ORDER — VENLAFAXINE HCL ER 75 MG PO CP24: 75.0000 mg | ORAL_CAPSULE | Freq: Every day | ORAL | Status: DC

## 2015-11-10 MED ORDER — CLONAZEPAM 0.5 MG PO TABS: 0.5000 mg | ORAL_TABLET | Freq: Every day | ORAL | Status: DC | PRN

## 2015-11-10 NOTE — Progress Notes (Signed)
BH MD/PA/NP OP Progress Note  11/10/2015 9:07 AM Katelyn Whitehead  MRN:  [id number]  Subjective:   Patient is a 25 year old married female who presented for the follow-up appointment. She reported that she continues to be anxious and is having panic attacks throughout the day. Patient reported that her anxiety symptoms are not improving. She reported that the Viibryd was making her feel worse. Patient reported that she has been taking Klonopin 3 times a day on a when necessary basis to control her anxiety. She appeared apprehensive during the interview. Patient reported that she wants to have her medications adjusted. We discussed about the medications at length. Patient reported that she is willing to have her medications adjusted. She currently denied having any suicidal homicidal ideations or plans. She denied having any perceptual disturbances. She reported that her grandmother is in the hospice and she is worried about her as well.   Chief Complaint:  Chief Complaint    Follow-up; Medication Refill; Anxiety; Panic Attack     Visit Diagnosis:     ICD-9-CM ICD-10-CM   1. Panic disorder 300.01 F41.0     Past Medical History:  Past Medical History  Diagnosis Date  . Anxiety   . Palpitations     a. 2012 Holter: sinus tach, pvc's.  . Mild mitral and aortic regurgitation     a. 04/2013 Echo: EF 50-55%, mild AI, mild MR, no MVP.  Marland Kitchen ADHD (attention deficit hyperactivity disorder)   . Depression   . Panic disorder     Past Surgical History  Procedure Laterality Date  . Wisdom tooth extraction     Family History:  Family History  Problem Relation Age of Onset  . Depression Mother   . Drug abuse Father   . ADD / ADHD Brother   . Depression Maternal Aunt   . Dementia Maternal Grandmother   . Depression Maternal Grandmother    Social History:  Social History   Social History  . Marital Status: Married    Spouse Name: N/A  . Number of Children: N/A  . Years of Education: N/A    Social History Main Topics  . Smoking status: Never Smoker   . Smokeless tobacco: Never Used  . Alcohol Use: No  . Drug Use: No  . Sexual Activity: Yes    Birth Control/ Protection: None   Other Topics Concern  . None   Social History Narrative   Additional History:  Currently married and lives with her husband and has good relationship with him.  Assessment:   Musculoskeletal: Strength & Muscle Tone: within normal limits Gait & Station: normal Patient leans: N/A  Psychiatric Specialty Exam: Anxiety    Depression        Past medical history includes anxiety.     Review of Systems  Psychiatric/Behavioral: Positive for depression.    Blood pressure 118/82, pulse 103, temperature 97.6 F (36.4 C), temperature source Tympanic, height  (1.575 m), weight 146 lb 9.6 oz (66.497 kg), last menstrual period 11/04/2015, SpO2 99 %.Body mass index is 26.81 kg/(m^2).  General Appearance: Casual and Fairly Groomed  Eye Contact:  Fair  Speech:  Clear and Coherent  Volume:  Normal  Mood:  Anxious  Affect:  Congruent  Thought Process:  Coherent  Orientation:  Full (Time, Place, and Person)  Thought Content:  WDL  Suicidal Thoughts:  No  Homicidal Thoughts:  No  Memory:  Immediate;   Fair  Judgement:  Fair  Insight:  Fair  Psychomotor  Activity:  Normal  Concentration:  Fair  Recall:  [employer]  Fund of Knowledge: Fair  Language: Fair  Akathisia:  No  Handed:  Right  AIMS (if indicated):    Assets:  Communication Skills Desire for Improvement Physical Health  ADL's:  Intact  Cognition: WNL  Sleep:  7   Is the patient at risk to self?  No. Has the patient been a risk to self in the past 6 months?  No. Has the patient been a risk to self within the distant past?  No. Is the patient a risk to others?  No. Has the patient been a risk to others in the past 6 months?  No. Has the patient been a risk to others within the distant past?  No.  Current  Medications: Current Outpatient Prescriptions  Medication Sig Dispense Refill  . busPIRone (BUSPAR) 5 MG tablet Take 1 tablet (5 mg total) by mouth 2 (two) times daily. 60 tablet 1  . citalopram (CELEXA) 10 MG tablet Take 1 tablet (10 mg total) by mouth daily. 30 tablet 1  . clonazePAM (KLONOPIN) 0.5 MG tablet Take 1 tablet (0.5 mg total) by mouth daily as needed for anxiety. 30 tablet 1  . Vilazodone HCl (VIIBRYD) 10 MG TABS Take 1 tablet (10 mg total) by mouth daily. 10mg  x 1 week then 20mg  daily - samples given 30 tablet 0   No current facility-administered medications for this visit.    Medical Decision Making:  Established Problem, Stable/Improving (1) and Review of Psycho-Social Stressors (1)  Treatment Plan Summary:Medication management   Discussed with patient what the medications treatment risks benefits and alternatives. I will discontinue the Viibryd and the BuSpar. Started her on Effexor 75 mg in the morning and she will titrate the dose to 150 mg in one week. Advised her to decrease the dose of Klonopin to twice a day when necessary and she agreed with the plan. She will follow-up in 2 weeks   Advised patient to call if she notices worsening of her symptoms and she demonstrated understanding   This note was generated in part or whole with voice recognition software. Voice regonition is usually quite accurate but there are transcription errors that can and very often do occur. I apologize for any typographical errors that were not detected and corrected.    Brandy HaleUzma Evola Hollis, MD  11/10/2015, 9:07 AM

## 2015-11-25 ENCOUNTER — Encounter: Payer: Self-pay | Admitting: Psychiatry

## 2015-11-25 ENCOUNTER — Ambulatory Visit (INDEPENDENT_AMBULATORY_CARE_PROVIDER_SITE_OTHER): Payer: 59 | Admitting: Psychiatry

## 2015-11-25 VITALS — BP 110/68 | HR 113 | Temp 98.0°F | Ht 62.0 in | Wt 142.6 lb

## 2015-11-25 DIAGNOSIS — F900 Attention-deficit hyperactivity disorder, predominantly inattentive type: Secondary | ICD-10-CM

## 2015-11-25 DIAGNOSIS — F410 Panic disorder [episodic paroxysmal anxiety] without agoraphobia: Secondary | ICD-10-CM | POA: Diagnosis not present

## 2015-11-25 DIAGNOSIS — F988 Other specified behavioral and emotional disorders with onset usually occurring in childhood and adolescence: Secondary | ICD-10-CM

## 2015-11-25 MED ORDER — VENLAFAXINE HCL ER 37.5 MG PO CP24: 37.5000 mg | ORAL_CAPSULE | Freq: Every day | ORAL | Status: DC

## 2015-11-25 MED ORDER — VENLAFAXINE HCL ER 75 MG PO CP24: 75.0000 mg | ORAL_CAPSULE | Freq: Every day | ORAL | Status: DC

## 2015-11-25 NOTE — Progress Notes (Signed)
BH MD/PA/NP OP Progress Note  11/25/2015 9:18 AM Katelyn Whitehead  MRN:  [id number]  Subjective:   Patient is a 25 year old married female who presented for the follow-up appointment. She reported that she has responded very well to the change in her medications. She reported that she is feeling stable on Effexor and she noticed improvement when she was started on Effexor XR 75 mg. She has noticed increase in her anxiety since the dose was increased to 150. Her pulse is higher. She wants to decrease the dose back to 75 mg. Patient reported that she has been feeling anxious this week as her grandmother passed away and the funeral is tomorrow. Patient reported that she is taking Klonopin only on a when necessary basis. She currently denied having any adverse effects of medications. She denied having any suicidal ideations or plans. She reported that she has also started to lose weight.  She appeared much calmer during the interview. She denied having any suicidal ideations or plans.    Chief Complaint:  Chief Complaint    Follow-up; Medication Refill; Other     Visit Diagnosis:     ICD-9-CM ICD-10-CM   1. Panic disorder 300.01 F41.0   2. ADD (attention deficit disorder) without hyperactivity 314.00 F90.0     Past Medical History:  Past Medical History  Diagnosis Date  . Anxiety   . Palpitations     a. 2012 Holter: sinus tach, pvc's.  . Mild mitral and aortic regurgitation     a. 04/2013 Echo: EF 50-55%, mild AI, mild MR, no MVP.  Marland Kitchen. ADHD (attention deficit hyperactivity disorder)   . Depression   . Panic disorder     Past Surgical History  Procedure Laterality Date  . Wisdom tooth extraction     Family History:  Family History  Problem Relation Age of Onset  . Depression Mother   . Drug abuse Father   . ADD / ADHD Brother   . Depression Maternal Aunt   . Dementia Maternal Grandmother   . Depression Maternal Grandmother    Social History:  Social History   Social History   . Marital Status: Married    Spouse Name: N/A  . Number of Children: N/A  . Years of Education: N/A   Social History Main Topics  . Smoking status: Never Smoker   . Smokeless tobacco: Never Used  . Alcohol Use: No  . Drug Use: No  . Sexual Activity: Yes    Birth Control/ Protection: None   Other Topics Concern  . None   Social History Narrative   Additional History:  Currently married and lives with her husband and has good relationship with him.  Assessment:   Musculoskeletal: Strength & Muscle Tone: within normal limits Gait & Station: normal Patient leans: N/A  Psychiatric Specialty Exam: Anxiety    Depression        Past medical history includes anxiety.     Review of Systems  Psychiatric/Behavioral: Positive for depression.    Blood pressure 110/68, pulse 113, temperature 98 F (36.7 C), temperature source Tympanic, height 5\' 2"  (1.575 m), weight 142 lb 9.6 oz (64.683 kg), last menstrual period 11/03/2015, SpO2 98 %.Body mass index is 26.08 kg/(m^2).  General Appearance: Casual and Fairly Groomed  Eye Contact:  Fair  Speech:  Clear and Coherent  Volume:  Normal  Mood:  Euthymic  Affect:  Congruent  Thought Process:  Coherent  Orientation:  Full (Time, Place, and Person)  Thought Content:  WDL  Suicidal Thoughts:  No  Homicidal Thoughts:  No  Memory:  Immediate;   Fair  Judgement:  Fair  Insight:  Fair  Psychomotor Activity:  Normal  Concentration:  Fair  Recall:  [employer] of Knowledge: Fair  Language: Fair  Akathisia:  No  Handed:  Right  AIMS (if indicated):    Assets:  Communication Skills Desire for Improvement Physical Health  ADL's:  Intact  Cognition: WNL  Sleep:  7   Is the patient at risk to self?  No. Has the patient been a risk to self in the past 6 months?  No. Has the patient been a risk to self within the distant past?  No. Is the patient a risk to others?  No. Has the patient been a risk to others in the past 6 months?   No. Has the patient been a risk to others within the distant past?  No.  Current Medications: Current Outpatient Prescriptions  Medication Sig Dispense Refill  . clonazePAM (KLONOPIN) 0.5 MG tablet Take 1 tablet (0.5 mg total) by mouth daily as needed for anxiety. 30 tablet 1  . venlafaxine XR (EFFEXOR XR) 75 MG 24 hr capsule Take 1 capsule (75 mg total) by mouth daily with breakfast. Take 1 pill in am x 1 week and then Take 2 pills in am 30 capsule 0   No current facility-administered medications for this visit.    Medical Decision Making:  Established Problem, Stable/Improving (1) and Review of Psycho-Social Stressors (1)  Treatment Plan Summary:Medication management   Discussed with patient what the medications treatment risks benefits and alternatives.  Continue  Effexor 75 mg in the morning and 37.5 mg qpm.  Patient is taking Klonopin on a when necessary basis and has enough supply of the medication She will follow-up in 3  weeks   Advised patient to call if she notices worsening of her symptoms and she demonstrated understanding   This note was generated in part or whole with voice recognition software. Voice regonition is usually quite accurate but there are transcription errors that can and very often do occur. I apologize for any typographical errors that were not detected and corrected.    Brandy Hale, MD  11/25/2015, 9:18 AM

## 2015-12-09 DIAGNOSIS — Z322 Encounter for childbirth instruction: Secondary | ICD-10-CM | POA: Diagnosis not present

## 2015-12-09 DIAGNOSIS — O034 Incomplete spontaneous abortion without complication: Secondary | ICD-10-CM | POA: Diagnosis not present

## 2015-12-17 ENCOUNTER — Encounter: Payer: Self-pay | Admitting: Psychiatry

## 2015-12-17 ENCOUNTER — Ambulatory Visit: Payer: 59 | Admitting: Psychiatry

## 2015-12-17 ENCOUNTER — Ambulatory Visit (INDEPENDENT_AMBULATORY_CARE_PROVIDER_SITE_OTHER): Payer: 59 | Admitting: Psychiatry

## 2015-12-17 VITALS — BP 100/62 | HR 110 | Temp 97.3°F | Ht 62.0 in | Wt 144.4 lb

## 2015-12-17 DIAGNOSIS — F410 Panic disorder [episodic paroxysmal anxiety] without agoraphobia: Secondary | ICD-10-CM | POA: Diagnosis not present

## 2015-12-17 MED ORDER — PROPRANOLOL HCL 10 MG PO TABS: 10.0000 mg | ORAL_TABLET | Freq: Two times a day (BID) | ORAL | Status: DC

## 2015-12-17 MED ORDER — VENLAFAXINE HCL ER 75 MG PO CP24: 75.0000 mg | ORAL_CAPSULE | Freq: Two times a day (BID) | ORAL | Status: DC

## 2015-12-17 NOTE — Progress Notes (Signed)
BH MD/PA/NP OP Progress Note  12/17/2015 9:12 AM Katelyn Whitehead  MRN:  [id number]  Subjective:   Patient is a 25 year old married female who presented for the follow-up appointment. She reported she had a miscarriage last week. She reported that she was [redacted] weeks pregnant. Patient reported that she did not know about her pregnancy. She stated that she was feeling anxious after she had the miscarriage. She is feeling better. Patient reported that she has been compliant with her medication and takes Klonopin on a when necessary basis. She has not increased the dose of her medication. She feels anxious in the evening. We discussed about the dosage of her medications. She is interested in taking propranolol as her pulse rate continues to be high. Patient currently denied having any suicidal ideations or plans. 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 We discussed about her medications in detail.  Chief Complaint:  Chief Complaint    Follow-up; Medication Refill; Anxiety; Depression; Panic Attack     Visit Diagnosis:     ICD-9-CM ICD-10-CM   1. Panic disorder 300.01 F41.0   2. ADD (attention deficit disorder) without hyperactivity 314.00 F90.0     Past Medical History:  Past Medical History  Diagnosis Date  . Anxiety   . Palpitations     a. 2012 Holter: sinus tach, pvc's.  . Mild mitral and aortic regurgitation     a. 04/2013 Echo: EF 50-55%, mild AI, mild MR, no MVP.  Marland Kitchen ADHD (attention deficit hyperactivity disorder)   . Depression   . Panic disorder     Past Surgical History  Procedure Laterality Date  . Wisdom tooth extraction     Family History:  Family History  Problem Relation Age of Onset  . Depression Mother   . Drug abuse Father   . ADD / ADHD Brother   . Depression Maternal Aunt   . Dementia Maternal Grandmother   . Depression Maternal Grandmother    Social History:  Social History   Social History  . Marital Status: Married    Spouse Name: N/A   . Number of Children: N/A  . Years of Education: N/A   Social History Main Topics  . Smoking status: Never Smoker   . Smokeless tobacco: Never Used  . Alcohol Use: No  . Drug Use: No  . Sexual Activity: Yes    Birth Control/ Protection: None   Other Topics Concern  . None   Social History Narrative   Additional History:  Currently married and lives with her husband and has good relationship with him.  Assessment:   Musculoskeletal: Strength & Muscle Tone: within normal limits Gait & Station: normal Patient leans: N/A  Psychiatric Specialty Exam: Anxiety    Depression        Past medical history includes anxiety.     Review of Systems  Psychiatric/Behavioral: Positive for depression.    Blood pressure 100/62, pulse 110, temperature 97.3 F (36.3 C), temperature source Tympanic, height  (1.575 m), weight 144 lb 6.4 oz (65.499 kg), last menstrual period 11/03/2015, SpO2 99 %.Body mass index is 26.4 kg/(m^2).  General Appearance: Casual and Fairly Groomed  Eye Contact:  Fair  Speech:  Clear and Coherent  Volume:  Normal  Mood:  Anxious  Affect:  Congruent  Thought Process:  Coherent  Orientation:  Full (Time, Place, and Person)  Thought Content:  WDL  Suicidal Thoughts:  No  Homicidal Thoughts:  No  Memory:  Immediate;  Fair  Judgement:  Fair  Insight:  Fair  Psychomotor Activity:  Normal  Concentration:  Fair  Recall:  [employer]  Fund of Knowledge: Fair  Language: Fair  Akathisia:  No  Handed:  Right  AIMS (if indicated):    Assets:  Communication Skills Desire for Improvement Physical Health  ADL's:  Intact  Cognition: WNL  Sleep:  7   Is the patient at risk to self?  No. Has the patient been a risk to self in the past 6 months?  No. Has the patient been a risk to self within the distant past?  No. Is the patient a risk to others?  No. Has the patient been a risk to others in the past 6 months?  No. Has the patient been a risk to others within  the distant past?  No.  Current Medications: Current Outpatient Prescriptions  Medication Sig Dispense Refill  . clonazePAM (KLONOPIN) 0.5 MG tablet Take 1 tablet (0.5 mg total) by mouth daily as needed for anxiety. 30 tablet 1  . venlafaxine XR (EFFEXOR XR) 75 MG 24 hr capsule Take 1 capsule (75 mg total) by mouth daily with breakfast. 30 capsule 1  . venlafaxine XR (EFFEXOR-XR) 37.5 MG 24 hr capsule Take 1 capsule (37.5 mg total) by mouth daily. 30 capsule 1   No current facility-administered medications for this visit.    Medical Decision Making:  Established Problem, Stable/Improving (1) and Review of Psycho-Social Stressors (1)  Treatment Plan Summary:Medication management   Discussed with patient what the medications treatment risks benefits and alternatives.  Increase  Effexor 75 mg  BID  Patient is taking Klonopin on a when necessary basis and has enough supply of the medication I will start her on propranolol 10 mg and patient will take initially 5 mg as her blood pressure is low. She will gradually titrate the dose depending on her needs. She will follow-up in 3  weeks   Advised patient to call if she notices worsening of her symptoms and she demonstrated understanding   This note was generated in part or whole with voice recognition software. Voice regonition is usually quite accurate but there are transcription errors that can and very often do occur. I apologize for any typographical errors that were not detected and corrected.    Brandy HaleUzma Coletta Lockner, MD  12/17/2015, 9:12 AM

## 2016-01-07 ENCOUNTER — Ambulatory Visit (INDEPENDENT_AMBULATORY_CARE_PROVIDER_SITE_OTHER): Payer: 59 | Admitting: Psychiatry

## 2016-01-07 ENCOUNTER — Encounter: Payer: Self-pay | Admitting: Psychiatry

## 2016-01-07 DIAGNOSIS — F410 Panic disorder [episodic paroxysmal anxiety] without agoraphobia: Secondary | ICD-10-CM | POA: Diagnosis not present

## 2016-01-07 MED ORDER — VENLAFAXINE HCL ER 37.5 MG PO CP24: 37.5000 mg | ORAL_CAPSULE | Freq: Every day | ORAL | Status: DC

## 2016-01-07 MED ORDER — CLONAZEPAM 0.5 MG PO TABS: 0.5000 mg | ORAL_TABLET | Freq: Every day | ORAL | Status: DC | PRN

## 2016-01-07 NOTE — Progress Notes (Signed)
BH MD/PA/NP OP Progress Note  01/07/2016 10:33 AM Katelyn Whitehead  MRN:  [id number]  Subjective:   Patient is a 25 year old married female who presented for the follow-up appointment. She reported she is again pregnant after she had a miscarriage. She reported that she is anxious as she has been feeling sick and has feelings of heaviness. Patient reported that she has not missed any periods yet. She reported that she is going to see her OB in 2 weeks. Patient reported that she continues to take her medications as prescribed. She finds that Effexor XR XR 75 mg twice stating was too much and she wants to continue taking 37.5 mg in the evening. She is also taking Klonopin as prescribed but is trying to decrease the dose. She took Benadryl to help her sleep. We discussed about the use of medications during the pregnancy. She had a miscarriage last time and we are trying to minimize the use of benzodiazepines and over-the-counter medications. Discussed at length about the anxiety which can cause her to have premature birth as well as increased risk of miscarriage and  she demonstrated understanding. Patient reported that she will continue with the Effexor XR as prescribed. Patient reported that she works 24 hours per week. She is also taking prenatal vitamins on a daily basis.   Chief Complaint:  Chief Complaint    Follow-up; Medication Refill     Visit Diagnosis:     ICD-9-CM ICD-10-CM   1. ADD (attention deficit disorder) without hyperactivity 314.00 F90.0   2. Panic disorder 300.01 F41.0     Past Medical History:  Past Medical History  Diagnosis Date  . Anxiety   . Palpitations     a. 2012 Holter: sinus tach, pvc's.  . Mild mitral and aortic regurgitation     a. 04/2013 Echo: EF 50-55%, mild AI, mild MR, no MVP.  Marland Kitchen. ADHD (attention deficit hyperactivity disorder)   . Depression   . Panic disorder     Past Surgical History  Procedure Laterality Date  . Wisdom tooth extraction     Family  History:  Family History  Problem Relation Age of Onset  . Depression Mother   . Drug abuse Father   . ADD / ADHD Brother   . Depression Maternal Aunt   . Dementia Maternal Grandmother   . Depression Maternal Grandmother    Social History:  Social History   Social History  . Marital Status: Married    Spouse Name: N/A  . Number of Children: N/A  . Years of Education: N/A   Social History Main Topics  . Smoking status: Never Smoker   . Smokeless tobacco: Never Used  . Alcohol Use: No  . Drug Use: No  . Sexual Activity: Yes    Birth Control/ Protection: None   Other Topics Concern  . None   Social History Narrative   Additional History:  Currently married and lives with her husband and has good relationship with him.  Assessment:   Musculoskeletal: Strength & Muscle Tone: within normal limits Gait & Station: normal Patient leans: N/A  Psychiatric Specialty Exam: Anxiety    Depression        Past medical history includes anxiety.     Review of Systems  Psychiatric/Behavioral: Positive for depression.    Blood pressure 98/58, pulse 118, temperature 97 F (36.1 C), temperature source Tympanic, height 5\' 2"  (1.575 m), weight 144 lb 12.8 oz (65.681 kg), last menstrual period 12/09/2015, SpO2 98 %.Body mass index  is 26.48 kg/(m^2).  General Appearance: Casual and Fairly Groomed  Eye Contact:  Fair  Speech:  Clear and Coherent  Volume:  Normal  Mood:  Anxious  Affect:  Congruent  Thought Process:  Coherent  Orientation:  Full (Time, Place, and Person)  Thought Content:  WDL  Suicidal Thoughts:  No  Homicidal Thoughts:  No  Memory:  Immediate;   Fair  Judgement:  Fair  Insight:  Fair  Psychomotor Activity:  Normal  Concentration:  Fair  Recall:  [employer] of Knowledge: Fair  Language: Fair  Akathisia:  No  Handed:  Right  AIMS (if indicated):    Assets:  Communication Skills Desire for Improvement Physical Health  ADL's:  Intact  Cognition: WNL   Sleep:  7   Is the patient at risk to self?  No. Has the patient been a risk to self in the past 6 months?  No. Has the patient been a risk to self within the distant past?  No. Is the patient a risk to others?  No. Has the patient been a risk to others in the past 6 months?  No. Has the patient been a risk to others within the distant past?  No.  Current Medications: Current Outpatient Prescriptions  Medication Sig Dispense Refill  . clonazePAM (KLONOPIN) 0.5 MG tablet Take 1 tablet (0.5 mg total) by mouth daily as needed for anxiety. 30 tablet 1  . propranolol (INDERAL) 10 MG tablet Take 1 tablet (10 mg total) by mouth 2 (two) times daily. 60 tablet 0  . venlafaxine XR (EFFEXOR XR) 75 MG 24 hr capsule Take 1 capsule (75 mg total) by mouth 2 (two) times daily. 60 capsule 1   No current facility-administered medications for this visit.    Medical Decision Making:  Established Problem, Stable/Improving (1) and Review of Psycho-Social Stressors (1)  Treatment Plan Summary:Medication management   Discussed with patient what the medications treatment risks benefits and alternatives.  Continue Effexor XR 75 mg in the morning and 37.5 mg in the evening. Advised her to decrease use of Klonopin and take half a pill in the evening on alternate days. DC propranolol Follow-up in 4 weeks or earlier depending on her symptoms She will also follow-up with her OB on a regular basis   Advised patient to call if she notices worsening of her symptoms and she demonstrated understanding   This note was generated in part or whole with voice recognition software. Voice regonition is usually quite accurate but there are transcription errors that can and very often do occur. I apologize for any typographical errors that were not detected and corrected.    Brandy Hale, MD  01/07/2016, 10:33 AM

## 2016-01-08 ENCOUNTER — Encounter: Payer: Self-pay | Admitting: Physician Assistant

## 2016-01-08 ENCOUNTER — Ambulatory Visit: Payer: Self-pay | Admitting: Physician Assistant

## 2016-01-08 ENCOUNTER — Other Ambulatory Visit
Admission: RE | Admit: 2016-01-08 | Discharge: 2016-01-08 | Disposition: A | Discharge status: Home / Self Care | Payer: 59 | Source: Ambulatory Visit | Attending: Physician Assistant | Admitting: Physician Assistant

## 2016-01-08 VITALS — BP 96/64 | HR 100 | Temp 98.6°F

## 2016-01-08 DIAGNOSIS — R0982 Postnasal drip: Secondary | ICD-10-CM

## 2016-01-08 DIAGNOSIS — R109 Unspecified abdominal pain: Secondary | ICD-10-CM | POA: Insufficient documentation

## 2016-01-08 DIAGNOSIS — Z3201 Encounter for pregnancy test, result positive: Secondary | ICD-10-CM

## 2016-01-08 LAB — HCG, QUANTITATIVE, PREGNANCY: HCG, BETA CHAIN, QUANT, S: 99 m[IU]/mL — AB (ref <5)

## 2016-01-08 LAB — POCT URINE PREGNANCY: PREG TEST UR: POSITIVE — AB

## 2016-01-08 NOTE — Progress Notes (Signed)
Beta hcg 99 which means she is 2-[redacted] weeks pregnant, pt to f/u with her ob/gyn

## 2016-01-08 NOTE — Progress Notes (Signed)
S: c/o sore throat, no fever/chills, ?sinus drainage, no cough or congestion, also had a + preg test at home, had a miscarriage last month, then had 2 neg preg test, + test yesterday and today, states she feels like she is cramping just like with the other one, no bleeding or discharge at this time; states she is not happy at [facility] and they cannot see her until next Friday, feels they didn't care  O: vitals wnl, nad, ent with pnd, neck supple no lymph lungs c t a, cv rrr, urine preg +  A: +preg test, post nasal drip  P: ordered beta hcg, pt to f/u with gyn, recommended [facility] if not happy at [facility]; saline nasal wash for pnd

## 2016-01-08 NOTE — Addendum Note (Signed)
Addended by: Yvonne KendallBROWN, Josephus Harriger D on: 01/08/2016 11:36 AM   Modules accepted: Orders

## 2016-01-12 ENCOUNTER — Other Ambulatory Visit
Admission: RE | Admit: 2016-01-12 | Discharge: 2016-01-12 | Disposition: A | Discharge status: Home / Self Care | Payer: 59 | Source: Ambulatory Visit | Attending: Obstetrics and Gynecology | Admitting: Obstetrics and Gynecology

## 2016-01-12 ENCOUNTER — Telehealth: Payer: Self-pay

## 2016-01-12 ENCOUNTER — Ambulatory Visit (INDEPENDENT_AMBULATORY_CARE_PROVIDER_SITE_OTHER): Payer: 59 | Admitting: Obstetrics and Gynecology

## 2016-01-12 VITALS — BP 103/69 | HR 98 | Wt 143.2 lb

## 2016-01-12 DIAGNOSIS — Z36 Encounter for antenatal screening of mother: Secondary | ICD-10-CM

## 2016-01-12 DIAGNOSIS — Z1389 Encounter for screening for other disorder: Secondary | ICD-10-CM

## 2016-01-12 DIAGNOSIS — T7589XA Other specified effects of external causes, initial encounter: Secondary | ICD-10-CM

## 2016-01-12 DIAGNOSIS — Z369 Encounter for antenatal screening, unspecified: Secondary | ICD-10-CM

## 2016-01-12 DIAGNOSIS — Z8742 Personal history of other diseases of the female genital tract: Secondary | ICD-10-CM

## 2016-01-12 DIAGNOSIS — Z3490 Encounter for supervision of normal pregnancy, unspecified, unspecified trimester: Secondary | ICD-10-CM

## 2016-01-12 DIAGNOSIS — Z8759 Personal history of other complications of pregnancy, childbirth and the puerperium: Secondary | ICD-10-CM

## 2016-01-12 DIAGNOSIS — O09291 Supervision of pregnancy with other poor reproductive or obstetric history, first trimester: Secondary | ICD-10-CM

## 2016-01-12 DIAGNOSIS — R300 Dysuria: Secondary | ICD-10-CM

## 2016-01-12 DIAGNOSIS — Z3687 Encounter for antenatal screening for uncertain dates: Secondary | ICD-10-CM

## 2016-01-12 DIAGNOSIS — Z331 Pregnant state, incidental: Secondary | ICD-10-CM | POA: Diagnosis not present

## 2016-01-12 DIAGNOSIS — R109 Unspecified abdominal pain: Secondary | ICD-10-CM | POA: Insufficient documentation

## 2016-01-12 DIAGNOSIS — Z113 Encounter for screening for infections with a predominantly sexual mode of transmission: Secondary | ICD-10-CM

## 2016-01-12 DIAGNOSIS — M545 Low back pain: Secondary | ICD-10-CM

## 2016-01-12 LAB — POCT URINALYSIS DIPSTICK
BILIRUBIN UA: NEGATIVE
GLUCOSE UA: NEGATIVE
KETONES UA: NEGATIVE
Leukocytes, UA: NEGATIVE
Nitrite, UA: NEGATIVE
SPEC GRAV UA: 1.015
UROBILINOGEN UA: NEGATIVE
pH, UA: 8

## 2016-01-12 LAB — HCG, QUANTITATIVE, PREGNANCY: hCG, Beta Chain, Quant, S: 671 m[IU]/mL — ABNORMAL HIGH (ref <5)

## 2016-01-12 LAB — ABO/RH: ABO/RH(D): O POS

## 2016-01-12 LAB — ANTIBODY SCREEN: ANTIBODY SCREEN: NEGATIVE

## 2016-01-12 NOTE — Telephone Encounter (Signed)
Pt notified that BHCG results:  671 and blood type O+.  Will repeat BHCG in 1 week.

## 2016-01-12 NOTE — Progress Notes (Signed)
Katelyn Whitehead presents for NOB nurse interview visit. G-2.  P-1010. Pt was seen at Dr. Mallie DartingS. Fisher's office for confirmation of pregnancy with positive upt and BHCG results of  99 on 01/08/2016.  Pt is weaning off of Klonopin. Pregnancy education material explained and given. Has cats in the home, and has not changed litter box since pregnant but will check blood work.  NOB labs ordered.  HIV labs and Drug screen were explained optional and she could opt out of tests but did not decline. Drug screen ordered.  Pt will have BHCG and Blood type done [facility] today. Also sent off urine culture today, urinalysis resulted: trace protein, small blood. Pt c/o lower back pain, abdominal pain (menstral like) and burning at completion of urination. Pt does have history of UTI's and fhx kidney stones.  PNV encouraged. NT to discuss with provider. Pt. [facility] and have ultrasound for viability and dating when appropriate with BHCG levels for NOB physical.  Pt informed that we will do BHCG's weekly until 2,000 level is reached. Pt had miscarriage 12/09/15, no D&C. Had negative pregnancy test at home 1 wk. Post miscarriage. No menses since then.  Pt is very anxious. Husband present at visit and is an EMT. [facility] but was dissatisfied with care received.  All questions answered.    ZIKA EXPOSURE SCREEN:  The patient has not traveled to a [place] endemic area within the past 6 months, nor has she had unprotected sex with a partner who has travelled to a [place] endemic region within the past 6 months. The patient has been advised to notify us if these factors change any time during this current pregnancy, so adequate testing and monitoring can be initiated.

## 2016-01-12 NOTE — Patient Instructions (Signed)
Minor Illnesses and Medications in Pregnancy  Cold/Flu:  Sudafed for congestion- Robitussin (plain) for cough- Tylenol for discomfort.  Please follow the directions on the label.  Try not to take any more than needed.  OTC Saline nasal spray and air humidifier or cool-mist  Vaporizer to sooth nasal irritation and to loosen congestion.  It is also important to increase intake of non carbonated fluids, especially if you have a fever.  Constipation:  Colace-2 capsules at bedtime; Metamucil- follow directions on label; Senokot- 1 tablet at bedtime.  Any one of these medications can be used.  It is also very important to increase fluids and fruits along with regular exercise.  If problem persists please call the office.  Diarrhea:  Kaopectate as directed on the label.  Eat a bland diet and increase fluids.  Avoid highly seasoned foods.  Headache:  Tylenol 1 or 2 tablets every 3-4 hours as needed  Indigestion:  Maalox, Mylanta, Tums or Rolaids- as directed on label.  Also try to eat small meals and avoid fatty, greasy or spicy foods.  Nausea with or without Vomiting:  Nausea in pregnancy is caused by increased levels of hormones in the body which influence the digestive system and cause irritation when stomach acids accumulate.  Symptoms usually subside after 1st trimester of pregnancy.  Try the following: 1. Keep saltines, graham crackers or dry toast by your bed to eat upon awakening. 2. Don't let your stomach get empty.  Try to eat 5-6 small meals per day instead of 3 large ones. 3. Avoid greasy fatty or highly seasoned foods.  4. Take OTC Unisom 1 tablet at bed time along with OTC Vitamin B6 25-50 mg 3 times per day.    If nausea continues with vomiting and you are unable to keep down food and fluids you may need a prescription medication.  Please notify your provider.   Sore throat:  Chloraseptic spray, throat lozenges and or plain Tylenol.  Vaginal Yeast Infection:  OTC Monistat for 7 days as  directed on label.  If symptoms do not resolve within a week notify provider.  If any of the above problems do not subside with recommended treatment please call the office for further assistance.   Do not take Aspirin, Advil, Motrin or Ibuprofen.  * * OTC= Over the counter Pregnancy and Zika Virus Disease Zika virus disease, or Zika, is an illness that can spread to people from mosquitoes that carry the virus. It may also spread from person to person through infected body fluids. Zika first occurred in [place], but recently it has spread to new areas. The virus occurs in tropical climates. The location of Zika continues to change. Most people who become infected with Zika virus do not develop serious illness. However, Zika may cause birth defects in an unborn baby whose mother is infected with the virus. It may also increase the risk of miscarriage. WHAT ARE THE SYMPTOMS OF ZIKA VIRUS DISEASE? In many cases, people who have been infected with Zika virus do not develop any symptoms. If symptoms appear, they usually start about a week after the person is infected. Symptoms are usually mild. They may include: 2. Fever. 3. Rash. 4. Red eyes. 5. Joint pain. HOW DOES ZIKA VIRUS DISEASE SPREAD? The main way that Zika virus spreads is through the bite of a certain type of mosquito. Unlike most types of mosquitos, which bite only at night, the type of mosquito that carries Zika virus bites both at night and   during the day. Zika virus can also spread through sexual contact, through a blood transfusion, and from a mother to her baby before or during birth. Once you have had Zika virus disease, it is unlikely that you will get it again. CAN I PASS ZIKA TO MY BABY DURING PREGNANCY? Yes, Zika can pass from a mother to her baby before or during birth. WHAT PROBLEMS CAN ZIKA CAUSE FOR MY BABY? A woman who is infected with Zika virus while pregnant is at risk of having her baby born with a condition in which the  brain or head is smaller than expected (microcephaly). Babies who have microcephaly can have developmental delays, seizures, hearing problems, and vision problems. Having Zika virus disease during pregnancy can also increase the risk of miscarriage. HOW CAN ZIKA VIRUS DISEASE BE PREVENTED? There is no vaccine to prevent Zika. The best way to prevent the disease is to avoid infected mosquitoes and avoid exposure to body fluids that can spread the virus. Avoid any possible exposure to Zika by taking the following precautions. For women and their sex partners:  Avoid traveling to high-risk areas. The locations where Zika is being reported change often. To identify high-risk areas, check the CDC travel website: www.cdc.gov/zika/geo/index.html  If you or your sex partner must travel to a high-risk area, talk with a health care provider before and after traveling.  Take all precautions to avoid mosquito bites if you live in, or travel to, any of the high-risk areas. Insect repellents are safe to use during pregnancy.  Ask your health care provider when it is safe to have sexual contact. For women:  If you are pregnant or trying to become pregnant, avoid sexual contact with persons who may have been exposed to Zika virus, persons who have possible symptoms of Zika, or persons whose history you are unsure about. If you choose to have sexual contact with someone who may have been exposed to Zika virus, use condoms correctly during the entire duration of sexual activity, every time. Do not share sexual devices, as you may be exposed to body fluids.  Ask your health care provider about when it is safe to attempt pregnancy after a possible exposure to Zika virus. WHAT STEPS SHOULD I TAKE TO AVOID MOSQUITO BITES? Take these steps to avoid mosquito bites when you are in a high-risk area:  Wear loose clothing that covers your arms and legs.  Limit your outdoor activities.  Do not open windows unless they  have window screens.  Sleep under mosquito nets.  Use insect repellent. The best insect repellents have:  DEET, picaridin, oil of lemon eucalyptus (OLE), or [id number] in them.  Higher amounts of an active ingredient in them.  Remember that insect repellents are safe to use during pregnancy.  Do not use OLE on children who are younger than 3 years of age. Do not use insect repellent on babies who are younger than 2 months of age.  Cover your child's stroller with mosquito netting. Make sure the netting fits snugly and that any loose netting does not cover your child's mouth or nose. Do not use a blanket as a mosquito-protection cover.  Do not apply insect repellent underneath clothing.  If you are using sunscreen, apply the sunscreen before applying the insect repellent.  Treat clothing with permethrin. Do not apply permethrin directly to your skin. Follow label directions for safe use.  Get rid of standing water, where mosquitoes may reproduce. Standing water is often found in items such   as buckets, bowls, animal food dishes, and flowerpots. When you return from traveling to any high-risk area, continue taking actions to protect yourself against mosquito bites for 3 weeks, even if you show no signs of illness. This will prevent spreading Zika virus to uninfected mosquitoes. WHAT SHOULD I KNOW ABOUT THE SEXUAL TRANSMISSION OF ZIKA? People can spread Zika to their sexual partners during vaginal, anal, or oral sex, or by sharing sexual devices. Many people with Zika do not develop symptoms, so a person could spread the disease without knowing that they are infected. The greatest risk is to women who are pregnant or who may become pregnant. Zika virus can live longer in semen than it can live in blood. Couples can prevent sexual transmission of the virus by:  Using condoms correctly during the entire duration of sexual activity, every time. This includes vaginal, anal, and oral sex.  Not  sharing sexual devices. Sharing increases your risk of being exposed to body fluid from another person.  Avoiding all sexual activity until your health care provider says it is safe. SHOULD I BE TESTED FOR ZIKA VIRUS? A sample of your blood can be tested for Zika virus. A pregnant woman should be tested if she may have been exposed to the virus or if she has symptoms of Zika. She may also have additional tests done during her pregnancy, such ultrasound testing. Talk with your health care provider about which tests are recommended.   This information is not intended to replace advice given to you by your health care provider. Make sure you discuss any questions you have with your health care provider.   Document Released: 04/16/2015 Document Reviewed: 04/09/2015 Elsevier Interactive Patient Education 2016 Elsevier Inc. Hyperemesis Gravidarum Hyperemesis gravidarum is a severe form of nausea and vomiting that happens during pregnancy. Hyperemesis is worse than morning sickness. It may cause you to have nausea or vomiting all day for many days. It may keep you from eating and drinking enough food and liquids. Hyperemesis usually occurs during the first half (the first 20 weeks) of pregnancy. It often goes away once a woman is in her second half of pregnancy. However, sometimes hyperemesis continues through an entire pregnancy.  CAUSES  The cause of this condition is not completely known but is thought to be related to changes in the body's hormones when pregnant. It could be from the high level of the pregnancy hormone or an increase in estrogen in the body.  SIGNS AND SYMPTOMS  6. Severe nausea and vomiting. 7. Nausea that does not go away. 8. Vomiting that does not allow you to keep any food down. 9. Weight loss and body fluid loss (dehydration). 10. Having no desire to eat or not liking food you have previously enjoyed. DIAGNOSIS  Your health care provider will do a physical exam and ask you  about your symptoms. He or she may also order blood tests and urine tests to make sure something else is not causing the problem.  TREATMENT  You may only need medicine to control the problem. If medicines do not control the nausea and vomiting, you will be treated in the hospital to prevent dehydration, increased acid in the blood (acidosis), weight loss, and changes in the electrolytes in your body that may harm the unborn baby (fetus). You may need IV fluids.  HOME CARE INSTRUCTIONS   Only take over-the-counter or prescription medicines as directed by your health care provider.  Try eating a couple of dry crackers or   toast in the morning before getting out of bed.  Avoid foods and smells that upset your stomach.  Avoid fatty and spicy foods.  Eat 5-6 small meals a day.  Do not drink when eating meals. Drink between meals.  For snacks, eat high-protein foods, such as cheese.  Eat or suck on things that have ginger in them. Ginger helps nausea.  Avoid food preparation. The smell of food can spoil your appetite.  Avoid iron pills and iron in your multivitamins until after 3-4 months of being pregnant. However, consult with your health care provider before stopping any prescribed iron pills. SEEK MEDICAL CARE IF:   Your abdominal pain increases.  You have a severe headache.  You have vision problems.  You are losing weight. SEEK IMMEDIATE MEDICAL CARE IF:   You are unable to keep fluids down.  You vomit blood.  You have constant nausea and vomiting.  You have excessive weakness.  You have extreme thirst.  You have dizziness or fainting.  You have a fever or persistent symptoms for more than 2-3 days.  You have a fever and your symptoms suddenly get worse. MAKE SURE YOU:   Understand these instructions.  Will watch your condition.  Will get help right away if you are not doing well or get worse.   This information is not intended to replace advice given to you  by your health care provider. Make sure you discuss any questions you have with your health care provider.   Document Released: 07/26/2005 Document Revised: 05/16/2013 Document Reviewed: 03/07/2013 Elsevier Interactive Patient Education 2016 Elsevier Inc. Commonly Asked Questions During Pregnancy  Cats: A parasite can be excreted in cat feces.  To avoid exposure you need to have another person empty the little box.  If you must empty the litter box you will need to wear gloves.  Wash your hands after handling your cat.  This parasite can also be found in raw or undercooked meat so this should also be avoided.  Colds, Sore Throats, Flu: Please check your medication sheet to see what you can take for symptoms.  If your symptoms are unrelieved by these medications please call the office.  Dental Work: Most any dental work your dentist recommends is permitted.  X-rays should only be taken during the first trimester if absolutely necessary.  Your abdomen should be shielded with a lead apron during all x-rays.  Please notify your provider prior to receiving any x-rays.  Novocaine is fine; gas is not recommended.  If your dentist requires a note from us prior to dental work please call the office and we will provide one for you.  Exercise: Exercise is an important part of staying healthy during your pregnancy.  You may continue most exercises you were accustomed to prior to pregnancy.  Later in your pregnancy you will most likely notice you have difficulty with activities requiring balance like riding a bicycle.  It is important that you listen to your body and avoid activities that put you at a higher risk of falling.  Adequate rest and staying well hydrated are a must!  If you have questions about the safety of specific activities ask your provider.    Exposure to Children with illness: Try to avoid obvious exposure; report any symptoms to us when noted,  If you have chicken pos, red measles or mumps, you  should be immune to these diseases.   Please do not take any vaccines while pregnant unless you have checked with   your OB provider.  Fetal Movement: After 28 weeks we recommend you do "kick counts" twice daily.  Lie or sit down in a calm quiet environment and count your baby movements "kicks".  You should feel your baby at least 10 times per hour.  If you have not felt 10 kicks within the first hour get up, walk around and have something sweet to eat or drink then repeat for an additional hour.  If count remains less than 10 per hour notify your provider.  Fumigating: Follow your pest control agent's advice as to how long to stay out of your home.  Ventilate the area well before re-entering.  Hemorrhoids:   Most over-the-counter preparations can be used during pregnancy.  Check your medication to see what is safe to use.  It is important to use a stool softener or fiber in your diet and to drink lots of liquids.  If hemorrhoids seem to be getting worse please call the office.   Hot Tubs:  Hot tubs Jacuzzis and saunas are not recommended while pregnant.  These increase your internal body temperature and should be avoided.  Intercourse:  Sexual intercourse is safe during pregnancy as long as you are comfortable, unless otherwise advised by your provider.  Spotting may occur after intercourse; report any bright red bleeding that is heavier than spotting.  Labor:  If you know that you are in labor, please go to the hospital.  If you are unsure, please call the office and let us help you decide what to do.  Lifting, straining, etc:  If your job requires heavy lifting or straining please check with your provider for any limitations.  Generally, you should not lift items heavier than that you can lift simply with your hands and arms (no back muscles)  Painting:  Paint fumes do not harm your pregnancy, but may make you ill and should be avoided if possible.  Latex or water based paints have less odor than  oils.  Use adequate ventilation while painting.  Permanents & Hair Color:  Chemicals in hair dyes are not recommended as they cause increase hair dryness which can increase hair loss during pregnancy.  " Highlighting" and permanents are allowed.  Dye may be absorbed differently and permanents may not hold as well during pregnancy.  Sunbathing:  Use a sunscreen, as skin burns easily during pregnancy.  Drink plenty of fluids; avoid over heating.  Tanning Beds:  Because their possible side effects are still unknown, tanning beds are not recommended.  Ultrasound Scans:  Routine ultrasounds are performed at approximately 20 weeks.  You will be able to see your baby's general anatomy an if you would like to know the gender this can usually be determined as well.  If it is questionable when you conceived you may also receive an ultrasound early in your pregnancy for dating purposes.  Otherwise ultrasound exams are not routinely performed unless there is a medical necessity.  Although you can request a scan we ask that you pay for it when conducted because insurance does not cover " patient request" scans.  Work: If your pregnancy proceeds without complications you may work until your due date, unless your physician or employer advises otherwise.  Round Ligament Pain/Pelvic Discomfort:  Sharp, shooting pains not associated with bleeding are fairly common, usually occurring in the second trimester of pregnancy.  They tend to be worse when standing up or when you remain standing for long periods of time.  These are the result   of pressure of certain pelvic ligaments called "round ligaments".  Rest, Tylenol and heat seem to be the most effective relief.  As the womb and fetus grow, they rise out of the pelvis and the discomfort improves.  Please notify the office if your pain seems different than that described.  It may represent a more serious condition.   

## 2016-01-15 LAB — CULTURE, OB URINE

## 2016-01-15 LAB — URINE CULTURE, OB REFLEX

## 2016-01-19 ENCOUNTER — Telehealth: Payer: Self-pay

## 2016-01-19 ENCOUNTER — Other Ambulatory Visit: Payer: 59

## 2016-01-19 ENCOUNTER — Other Ambulatory Visit
Admission: RE | Admit: 2016-01-19 | Discharge: 2016-01-19 | Disposition: A | Discharge status: Home / Self Care | Payer: 59 | Source: Ambulatory Visit | Attending: Obstetrics and Gynecology | Admitting: Obstetrics and Gynecology

## 2016-01-19 DIAGNOSIS — Z331 Pregnant state, incidental: Secondary | ICD-10-CM | POA: Diagnosis not present

## 2016-01-19 DIAGNOSIS — R109 Unspecified abdominal pain: Secondary | ICD-10-CM | POA: Diagnosis not present

## 2016-01-19 LAB — HCG, QUANTITATIVE, PREGNANCY: hCG, Beta Chain, Quant, S: 11024 m[IU]/mL — ABNORMAL HIGH (ref <5)

## 2016-01-19 NOTE — Telephone Encounter (Signed)
[facility] which were 11,024. Pt very excited. Will have repeat next week.

## 2016-01-22 ENCOUNTER — Other Ambulatory Visit: Payer: Self-pay

## 2016-01-22 DIAGNOSIS — O09291 Supervision of pregnancy with other poor reproductive or obstetric history, first trimester: Secondary | ICD-10-CM

## 2016-01-26 ENCOUNTER — Other Ambulatory Visit: Payer: 59

## 2016-01-26 ENCOUNTER — Other Ambulatory Visit: Payer: Self-pay | Admitting: Obstetrics and Gynecology

## 2016-01-26 DIAGNOSIS — Z3490 Encounter for supervision of normal pregnancy, unspecified, unspecified trimester: Secondary | ICD-10-CM

## 2016-01-26 DIAGNOSIS — O09291 Supervision of pregnancy with other poor reproductive or obstetric history, first trimester: Secondary | ICD-10-CM

## 2016-01-26 DIAGNOSIS — Z369 Encounter for antenatal screening, unspecified: Secondary | ICD-10-CM

## 2016-01-27 ENCOUNTER — Telehealth: Payer: Self-pay | Admitting: *Deleted

## 2016-01-27 DIAGNOSIS — Z369 Encounter for antenatal screening, unspecified: Secondary | ICD-10-CM

## 2016-01-27 DIAGNOSIS — O09291 Supervision of pregnancy with other poor reproductive or obstetric history, first trimester: Secondary | ICD-10-CM

## 2016-01-27 DIAGNOSIS — Z3687 Encounter for antenatal screening for uncertain dates: Secondary | ICD-10-CM

## 2016-01-27 LAB — BETA HCG QUANT (REF LAB): HCG QUANT: 36532 m[IU]/mL

## 2016-01-27 MED ORDER — ONDANSETRON HCL 4 MG PO TABS: 4.0000 mg | ORAL_TABLET | Freq: Three times a day (TID) | ORAL | Status: DC | PRN

## 2016-01-27 NOTE — Telephone Encounter (Signed)
Pt informed of BHCG: 36532 on 01/26/16 which puts pt about 7-8 wks.  Ultrasound scheduled for 01/28/16 at 10am and after that a NOB appt will be made with [facility]. Also will send in rx for Zofran, Vit. B6 not really helping. Pt is very nauseated.

## 2016-01-27 NOTE — Telephone Encounter (Signed)
Patient called and stated that she needs something for nausea. Patient is also inquiring about her lab results. Patient came in for her NOB intake on 01/12/16. Patient want to see Melody. Patient is requesting a call back [phone]

## 2016-01-28 ENCOUNTER — Ambulatory Visit (INDEPENDENT_AMBULATORY_CARE_PROVIDER_SITE_OTHER): Payer: 59

## 2016-01-28 DIAGNOSIS — Z36 Encounter for antenatal screening of mother: Secondary | ICD-10-CM | POA: Diagnosis not present

## 2016-01-28 DIAGNOSIS — O09291 Supervision of pregnancy with other poor reproductive or obstetric history, first trimester: Secondary | ICD-10-CM

## 2016-01-28 DIAGNOSIS — Z3687 Encounter for antenatal screening for uncertain dates: Secondary | ICD-10-CM

## 2016-01-28 DIAGNOSIS — Z369 Encounter for antenatal screening, unspecified: Secondary | ICD-10-CM

## 2016-01-28 NOTE — Telephone Encounter (Signed)
Pt is aware.  

## 2016-01-28 NOTE — Telephone Encounter (Signed)
Please let her know that Quants look great. And I will see her at NOB at 11 weeks

## 2016-01-29 ENCOUNTER — Telehealth: Payer: Self-pay | Admitting: Obstetrics and Gynecology

## 2016-01-29 ENCOUNTER — Telehealth: Payer: Self-pay | Admitting: *Deleted

## 2016-01-29 NOTE — Telephone Encounter (Signed)
THIS PT HAD AN DATING AND VIABILITY SCAN YESTERDAY. THE US SAID SHE WAS 6 WK 4 DAYS. SHE WANTS TO KNOW IF IT NECESSARY FOR HER TO HAVE BLOOD DRAWS HCG NOW EVERY WEEK?

## 2016-01-29 NOTE — Telephone Encounter (Signed)
Notified pt she voiced understanding 

## 2016-01-29 NOTE — Telephone Encounter (Signed)
Spoke with pt she voiced understanding 

## 2016-01-29 NOTE — Telephone Encounter (Signed)
No need for blood draws weekly, but I would repeat u/s at 9 weeks

## 2016-01-29 NOTE — Telephone Encounter (Signed)
Patient husband called and stated that they received a call from the office and they are returning the call. Patient husband is requesting a call back. Her number [phone]. Thanks

## 2016-01-30 ENCOUNTER — Telehealth: Payer: Self-pay | Admitting: Obstetrics and Gynecology

## 2016-01-30 ENCOUNTER — Other Ambulatory Visit: Payer: Self-pay | Admitting: Obstetrics and Gynecology

## 2016-01-30 MED ORDER — DOXYLAMINE-PYRIDOXINE 10-10 MG PO TBEC: 2.0000 | DELAYED_RELEASE_TABLET | Freq: Every day | ORAL | Status: DC

## 2016-01-30 MED ORDER — PROMETHAZINE HCL 25 MG PO TABS: 25.0000 mg | ORAL_TABLET | Freq: Four times a day (QID) | ORAL | Status: DC | PRN

## 2016-01-30 NOTE — Telephone Encounter (Signed)
Please let her know I sent in a RX for both phenergan and diclegis. I don't see what insurance she has if any, so she may have to choose based on that

## 2016-01-30 NOTE — Telephone Encounter (Signed)
Called pt.

## 2016-01-30 NOTE — Telephone Encounter (Signed)
pls advise

## 2016-01-30 NOTE — Telephone Encounter (Signed)
PT IS SO SICK AND DIZZY, THE ZOFRAN MAKES HER SO SLEEPY, SHE HAS TO WORK TOMORROW AND WANTS TO KNOW IF SHE CAN TAKE SOMETHING ELSE.

## 2016-02-02 DIAGNOSIS — Z0289 Encounter for other administrative examinations: Secondary | ICD-10-CM

## 2016-02-04 ENCOUNTER — Ambulatory Visit: Payer: 59 | Admitting: Psychiatry

## 2016-02-18 ENCOUNTER — Other Ambulatory Visit: Payer: 59

## 2016-02-18 ENCOUNTER — Other Ambulatory Visit: Payer: Self-pay | Admitting: Obstetrics and Gynecology

## 2016-02-18 ENCOUNTER — Ambulatory Visit (INDEPENDENT_AMBULATORY_CARE_PROVIDER_SITE_OTHER): Payer: 59

## 2016-02-18 DIAGNOSIS — O3680X0 Pregnancy with inconclusive fetal viability, not applicable or unspecified: Secondary | ICD-10-CM

## 2016-02-18 DIAGNOSIS — T7589XA Other specified effects of external causes, initial encounter: Secondary | ICD-10-CM | POA: Diagnosis not present

## 2016-02-18 DIAGNOSIS — Z113 Encounter for screening for infections with a predominantly sexual mode of transmission: Secondary | ICD-10-CM | POA: Diagnosis not present

## 2016-02-18 DIAGNOSIS — Z331 Pregnant state, incidental: Secondary | ICD-10-CM | POA: Diagnosis not present

## 2016-02-18 DIAGNOSIS — Z36 Encounter for antenatal screening of mother: Secondary | ICD-10-CM | POA: Diagnosis not present

## 2016-02-19 ENCOUNTER — Encounter: Payer: Self-pay | Admitting: Psychiatry

## 2016-02-19 ENCOUNTER — Ambulatory Visit (INDEPENDENT_AMBULATORY_CARE_PROVIDER_SITE_OTHER): Payer: 59 | Admitting: Psychiatry

## 2016-02-19 VITALS — BP 100/62 | HR 62 | Temp 97.9°F | Ht 62.0 in | Wt 145.8 lb

## 2016-02-19 DIAGNOSIS — F410 Panic disorder [episodic paroxysmal anxiety] without agoraphobia: Secondary | ICD-10-CM | POA: Diagnosis not present

## 2016-02-19 LAB — CBC WITH DIFFERENTIAL/PLATELET
BASOS ABS: 0 10*3/uL (ref 0.0–0.2)
BASOS: 0 %
EOS (ABSOLUTE): 0.4 10*3/uL (ref 0.0–0.4)
EOS: 5 %
HEMATOCRIT: 37.6 % (ref 34.0–46.6)
HEMOGLOBIN: 12.8 g/dL (ref 11.1–15.9)
IMMATURE GRANS (ABS): 0 10*3/uL (ref 0.0–0.1)
Immature Granulocytes: 0 %
LYMPHS ABS: 1.4 10*3/uL (ref 0.7–3.1)
LYMPHS: 20 %
MCH: 28.1 pg (ref 26.6–33.0)
MCHC: 34 g/dL (ref 31.5–35.7)
MCV: 83 fL (ref 79–97)
MONOCYTES: 11 %
Monocytes Absolute: 0.8 10*3/uL (ref 0.1–0.9)
NEUTROS ABS: 4.6 10*3/uL (ref 1.4–7.0)
Neutrophils: 64 %
Platelets: 280 10*3/uL (ref 150–379)
RBC: 4.56 x10E6/uL (ref 3.77–5.28)
RDW: 14.3 % (ref 12.3–15.4)
WBC: 7.2 10*3/uL (ref 3.4–10.8)

## 2016-02-19 LAB — TOXOPLASMA ANTIBODIES- IGG AND  IGM: Toxoplasma IgG Ratio: <3 IU/mL (ref 0.0–7.1)

## 2016-02-19 LAB — RH TYPE: Rh Factor: POSITIVE

## 2016-02-19 LAB — RPR: RPR: NONREACTIVE

## 2016-02-19 LAB — HEPATITIS B SURFACE ANTIGEN: HEP B S AG: NEGATIVE

## 2016-02-19 LAB — VARICELLA ZOSTER ANTIBODY, IGM: Varicella IgM: <0.91 index (ref 0.00–0.90)

## 2016-02-19 LAB — HIV ANTIBODY (ROUTINE TESTING W REFLEX): HIV Screen 4th Generation wRfx: NONREACTIVE

## 2016-02-19 LAB — RUBELLA ANTIBODY, IGM

## 2016-02-19 MED ORDER — VENLAFAXINE HCL ER 75 MG PO CP24: 75.0000 mg | ORAL_CAPSULE | Freq: Two times a day (BID) | ORAL | Status: DC

## 2016-02-19 NOTE — Progress Notes (Signed)
BH MD/PA/NP OP Progress Note  02/19/2016 10:44 AM Katelyn Whitehead  MRN:  [id number]  Subjective:   Patient is a 25 year old married female who presented for the follow-up appointment. She reported she is [redacted] weeks  pregnant and her pregnancy is progressing well. She reported that she is becoming very anxious and has not gone to her work for the past 4 weeks. 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 Patient reported that she is looking for and that her job. She reported that she has been calling around and has found and the job. She reported that she feels worried that she has to stand for 12 hour shifts. She sleeps well at night. She has started taking Klonopin half a pill in the morning. She is also worried about the adverse reactions related to the Klonopin on her baby. We discussed about her medications at length. Patient reported that she feels that the Effexor is not effective. We have tried several medications in the past. We discussed about her medications in detail. Patient reported that she is following with her OB appointments on a regular basis. We discussed about her work also. Patient reported that her husband does not want her to stop working at this time. She continues to have anxiety related to her work schedule at this time. She currently denied having any suicidal homicidal ideations or plans.      Chief Complaint:  Chief Complaint    Follow-up; Medication Problem; Depression; Fatigue; Stress     Visit Diagnosis:     ICD-9-CM ICD-10-CM   1. Panic disorder 300.01 F41.0     Past Medical History:  Past Medical History  Diagnosis Date  . Anxiety   . Palpitations     a. 2012 Holter: sinus tach, pvc's.  . Mild mitral and aortic regurgitation     a. 04/2013 Echo: EF 50-55%, mild AI, mild MR, no MVP.  Marland Kitchen ADHD (attention deficit hyperactivity disorder)   . Depression   . Panic disorder     Past Surgical History   Procedure Laterality Date  . Wisdom tooth extraction     Family History:  Family History  Problem Relation Age of Onset  . Depression Mother   . Drug abuse Father   . ADD / ADHD Brother   . Depression Maternal Aunt   . Dementia Maternal Grandmother   . Depression Maternal Grandmother    Social History:  Social History   Social History  . Marital Status: Married    Spouse Name: N/A  . Number of Children: N/A  . Years of Education: N/A   Social History Main Topics  . Smoking status: Never Smoker   . Smokeless tobacco: Never Used  . Alcohol Use: No  . Drug Use: No  . Sexual Activity: Yes    Birth Control/ Protection: None   Other Topics Concern  . None   Social History Narrative   Additional History:  Currently married and lives with her husband and has good relationship with him.  Assessment:   Musculoskeletal: Strength & Muscle Tone: within normal limits Gait & Station: normal Patient leans: N/A  Psychiatric Specialty Exam: Depression        Past medical history includes anxiety.   Anxiety      Review of Systems  Psychiatric/Behavioral: Positive for depression.    Blood pressure 100/62, pulse 62, temperature 97.9 F (36.6 C), temperature source Tympanic, height  (1.575  m), weight 145 lb 12.8 oz (66.134 kg), last menstrual period 12/09/2015, SpO2 98 %.Body mass index is 26.66 kg/(m^2).  General Appearance: Casual and Fairly Groomed  Eye Contact:  Fair  Speech:  Clear and Coherent  Volume:  Normal  Mood:  Anxious  Affect:  Congruent  Thought Process:  Coherent  Orientation:  Full (Time, Place, and Person)  Thought Content:  WDL  Suicidal Thoughts:  No  Homicidal Thoughts:  No  Memory:  Immediate;   Fair  Judgement:  Fair  Insight:  Fair  Psychomotor Activity:  Normal  Concentration:  Fair  Recall:  [employer]  Fund of Knowledge: Fair  Language: Fair  Akathisia:  No  Handed:  Right  AIMS (if indicated):    Assets:  Communication  Skills Desire for Improvement Physical Health  ADL's:  Intact  Cognition: WNL  Sleep:  7   Is the patient at risk to self?  No. Has the patient been a risk to self in the past 6 months?  No. Has the patient been a risk to self within the distant past?  No. Is the patient a risk to others?  No. Has the patient been a risk to others in the past 6 months?  No. Has the patient been a risk to others within the distant past?  No.  Current Medications: Current Outpatient Prescriptions  Medication Sig Dispense Refill  . clonazePAM (KLONOPIN) 0.5 MG tablet Take 1 tablet (0.5 mg total) by mouth daily as needed for anxiety. 30 tablet 1  . Doxylamine-Pyridoxine (DICLEGIS) 10-10 MG TBEC Take 2 tablets by mouth at bedtime. If symptoms persist, add one tablet in the morning and one in the afternoon 100 tablet 5  . ondansetron (ZOFRAN) 4 MG tablet Take 1 tablet (4 mg total) by mouth every 8 (eight) hours as needed for nausea or vomiting. 20 tablet 1  . Prenatal Vit-Fe Fumarate-FA (PRENATAL MULTIVITAMIN) TABS tablet Take 1 tablet by mouth daily at 12 noon.    . promethazine (PHENERGAN) 25 MG tablet Take 1 tablet (25 mg total) by mouth every 6 (six) hours as needed for nausea or vomiting. 30 tablet 2  . venlafaxine XR (EFFEXOR XR) 75 MG 24 hr capsule Take 1 capsule (75 mg total) by mouth 2 (two) times daily. 60 capsule 1  . venlafaxine XR (EFFEXOR-XR) 37.5 MG 24 hr capsule Take 1 capsule (37.5 mg total) by mouth daily after supper. 30 capsule 1   No current facility-administered medications for this visit.    Medical Decision Making:  Established Problem, Stable/Improving (1) and Review of Psycho-Social Stressors (1)  Treatment Plan Summary:Medication management   Discussed with patient what the medications treatment risks benefits and alternatives.  Continue Effexor XR 75 mgDOB twice a day to help with her anxiety symptoms.  Advised her to decrease use of Klonopin and take half a pill in the  evening on alternate days. Follow-up in 2  weeks or earlier depending on her symptoms She will also follow-up with therapy appointments to help with her anxiety symptoms.  Advised patient to call if she notices worsening of her symptoms and she demonstrated understanding   This note was generated in part or whole with voice recognition software. Voice regonition is usually quite accurate but there are transcription errors that can and very often do occur. I apologize for any typographical errors that were not detected and corrected.    Brandy HaleUzma Arnola Crittendon, MD  02/19/2016, 10:44 AM

## 2016-02-20 ENCOUNTER — Telehealth: Payer: Self-pay | Admitting: Obstetrics and Gynecology

## 2016-02-20 NOTE — Telephone Encounter (Signed)
Pt called and wanted to know if her labs were back yet from Wednesday and if so wanted to know the results.

## 2016-02-23 NOTE — Telephone Encounter (Signed)
Spoke with pt she wanted to know if she was anemic, advised her this would get better in her 2nd trimester

## 2016-02-25 ENCOUNTER — Other Ambulatory Visit: Payer: Self-pay | Admitting: Obstetrics and Gynecology

## 2016-02-25 DIAGNOSIS — O09899 Supervision of other high risk pregnancies, unspecified trimester: Secondary | ICD-10-CM | POA: Insufficient documentation

## 2016-02-25 DIAGNOSIS — Z2839 Other underimmunization status: Secondary | ICD-10-CM | POA: Insufficient documentation

## 2016-02-25 DIAGNOSIS — O9989 Other specified diseases and conditions complicating pregnancy, childbirth and the puerperium: Principal | ICD-10-CM

## 2016-02-25 DIAGNOSIS — Z283 Underimmunization status: Secondary | ICD-10-CM | POA: Insufficient documentation

## 2016-02-27 ENCOUNTER — Ambulatory Visit: Payer: 59 | Admitting: Licensed Clinical Social Worker

## 2016-03-03 ENCOUNTER — Encounter: Payer: Self-pay | Admitting: Obstetrics and Gynecology

## 2016-03-03 ENCOUNTER — Ambulatory Visit: Payer: 59 | Admitting: Psychiatry

## 2016-03-04 ENCOUNTER — Ambulatory Visit (INDEPENDENT_AMBULATORY_CARE_PROVIDER_SITE_OTHER): Payer: 59 | Admitting: Obstetrics and Gynecology

## 2016-03-04 VITALS — BP 113/61 | HR 96 | Wt 148.1 lb

## 2016-03-04 DIAGNOSIS — Z3492 Encounter for supervision of normal pregnancy, unspecified, second trimester: Secondary | ICD-10-CM

## 2016-03-04 DIAGNOSIS — O09899 Supervision of other high risk pregnancies, unspecified trimester: Secondary | ICD-10-CM

## 2016-03-04 DIAGNOSIS — Z789 Other specified health status: Secondary | ICD-10-CM

## 2016-03-04 DIAGNOSIS — F419 Anxiety disorder, unspecified: Secondary | ICD-10-CM

## 2016-03-04 DIAGNOSIS — Z2839 Other underimmunization status: Secondary | ICD-10-CM

## 2016-03-04 DIAGNOSIS — Z283 Underimmunization status: Secondary | ICD-10-CM

## 2016-03-04 DIAGNOSIS — O9989 Other specified diseases and conditions complicating pregnancy, childbirth and the puerperium: Secondary | ICD-10-CM

## 2016-03-04 LAB — POCT URINALYSIS DIPSTICK
Bilirubin, UA: NEGATIVE
Glucose, UA: NEGATIVE
Ketones, UA: NEGATIVE
LEUKOCYTES UA: NEGATIVE
NITRITE UA: NEGATIVE
Spec Grav, UA: 1.01
UROBILINOGEN UA: 0.2
pH, UA: 7

## 2016-03-04 MED ORDER — SERTRALINE HCL 25 MG PO TABS: 25.0000 mg | ORAL_TABLET | Freq: Every day | ORAL | 2 refills | Status: DC

## 2016-03-04 MED ORDER — PANTOPRAZOLE SODIUM 40 MG IV SOLR: 40.0000 mg | INTRAVENOUS | Status: DC

## 2016-03-04 MED ORDER — PANTOPRAZOLE SODIUM 40 MG PO TBEC: 40.0000 mg | DELAYED_RELEASE_TABLET | Freq: Every day | ORAL | 4 refills | Status: DC

## 2016-03-04 NOTE — Progress Notes (Signed)
NOB physical- pt has high anxiety, is taking medication, has been so sick- nausea

## 2016-03-04 NOTE — Patient Instructions (Signed)

## 2016-03-04 NOTE — Progress Notes (Signed)
NEW OB HISTORY AND PHYSICAL  SUBJECTIVE:       Katelyn Whitehead is a 25 y.o. G2P0010 female, Patient's last menstrual period was 12/09/2015 (lmp unknown)., Estimated Date of Delivery: 09/14/16, [redacted]w[redacted]d, presents today for establishment of Prenatal Care. She has no unusual complaints and complains of nausea and heartburn, worsening anxiety.      Gynecologic History Patient's last menstrual period was 12/09/2015 (lmp unknown). Normal Contraception: none Last Pap: march 2017. Results were: normal  Obstetric History OB History  Gravida Para Term Preterm AB Living  2       1    SAB TAB Ectopic Multiple Live Births  1            # Outcome Date GA Lbr Len/2nd Weight Sex Delivery Anes PTL Lv  2 Current           1 SAB 12/09/15 [redacted]w[redacted]d      N FD      Past Medical History:  Diagnosis Date  . ADHD (attention deficit hyperactivity disorder)   . Anxiety   . Depression   . Mild mitral and aortic regurgitation    a. 04/2013 Echo: EF 50-55%, mild AI, mild MR, no MVP.  Marland Kitchen Palpitations    a. 2012 Holter: sinus tach, pvc's.  . Panic disorder     Past Surgical History:  Procedure Laterality Date  . WISDOM TOOTH EXTRACTION      Current Outpatient Prescriptions on File Prior to Visit  Medication Sig Dispense Refill  . clonazePAM (KLONOPIN) 0.5 MG tablet Take 1 tablet (0.5 mg total) by mouth daily as needed for anxiety. 30 tablet 1  . Prenatal Vit-Fe Fumarate-FA (PRENATAL MULTIVITAMIN) TABS tablet Take 1 tablet by mouth daily at 12 noon.    . venlafaxine XR (EFFEXOR XR) 75 MG 24 hr capsule Take 1 capsule (75 mg total) by mouth 2 (two) times daily. 60 capsule 1  . venlafaxine XR (EFFEXOR-XR) 37.5 MG 24 hr capsule Take 1 capsule (37.5 mg total) by mouth daily after supper. 30 capsule 1  . Doxylamine-Pyridoxine (DICLEGIS) 10-10 MG TBEC Take 2 tablets by mouth at bedtime. If symptoms persist, add one tablet in the morning and one in the afternoon (Patient not taking: Reported on 03/04/2016) 100 tablet 5   . ondansetron (ZOFRAN) 4 MG tablet Take 1 tablet (4 mg total) by mouth every 8 (eight) hours as needed for nausea or vomiting. (Patient not taking: Reported on 03/04/2016) 20 tablet 1  . promethazine (PHENERGAN) 25 MG tablet Take 1 tablet (25 mg total) by mouth every 6 (six) hours as needed for nausea or vomiting. (Patient not taking: Reported on 03/04/2016) 30 tablet 2   No current facility-administered medications on file prior to visit.     Allergies  Allergen Reactions  . Sulfa Antibiotics     Hives Rash    Social History   Social History  . Marital status: Married    Spouse name: N/A  . Number of children: N/A  . Years of education: N/A   Occupational History  . Not on file.   Social History Main Topics  . Smoking status: Never Smoker  . Smokeless tobacco: Never Used  . Alcohol use No  . Drug use: No  . Sexual activity: Yes    Birth control/ protection: None   Other Topics Concern  . Not on file   Social History Narrative  . No narrative on file    Family History  Problem Relation Age of Onset  .  Depression Mother   . Drug abuse Father   . ADD / ADHD Brother   . Depression Maternal Aunt   . Dementia Maternal Grandmother   . Depression Maternal Grandmother     The following portions of the patient's history were reviewed and updated as appropriate: allergies, current medications, past OB history, past medical history, past surgical history, past family history, past social history, and problem list.    OBJECTIVE: Initial Physical Exam (New OB)  GENERAL APPEARANCE: alert, well appearing, in no apparent distress, oriented to person, place and time, anxious HEAD: normocephalic, atraumatic MOUTH: mucous membranes moist, pharynx normal without lesions and dental hygiene good THYROID: no thyromegaly or masses present BREASTS: not examined LUNGS: clear to auscultation, no wheezes, rales or rhonchi, symmetric air entry HEART: regular rate and rhythm, no  murmurs ABDOMEN: soft, nontender, nondistended, no abnormal masses, no epigastric pain, fundus not palpable and FHT present EXTREMITIES: no redness or tenderness in the calves or thighs SKIN: normal coloration and turgor, no rashes LYMPH NODES: no adenopathy palpable NEUROLOGIC: alert, oriented, normal speech, no focal findings or movement disorder noted  PELVIC EXAM not indicated  ASSESSMENT: Normal pregnancy Anxiety disorder GERD  PLAN: Prenatal care Desires genetic screening- labs obtained Switched effexor to zoloft, encouraged to stop klonipin See orders

## 2016-03-11 ENCOUNTER — Encounter: Payer: Self-pay | Admitting: Obstetrics and Gynecology

## 2016-03-12 ENCOUNTER — Ambulatory Visit: Payer: Self-pay | Admitting: Physician Assistant

## 2016-03-16 ENCOUNTER — Telehealth: Payer: Self-pay | Admitting: Obstetrics and Gynecology

## 2016-03-16 NOTE — Telephone Encounter (Signed)
I usually recommend checking CDC site for Zika advisories for that region, and would base it off that.

## 2016-03-16 NOTE — Telephone Encounter (Signed)
Pt came by and she had a cruz lined [place], and I talked to amy and she said to send a message to you about the Katelyn Whitehead and the zika on how you would want to advise them, do you allow them to go or recommend them not going.

## 2016-03-17 ENCOUNTER — Telehealth: Payer: Self-pay | Admitting: Obstetrics and Gynecology

## 2016-03-17 NOTE — Telephone Encounter (Signed)
All paperwork and carnival cruise paperwork done as well, all was faxed and notified her husband Apolinar JunesBrandon it was ready for p/up

## 2016-03-17 NOTE — Telephone Encounter (Signed)
THIS IS FOR DOCUMENTATION PURPOSE ONL;Y PER MNS.  PT AND PTS HUSBAND CAME IN ON 03/16/16 TO GIVE PAPER WORK TO OFFICE TO RELEASE HER GOING ON A CRUZ OUTSIDE THE [place], TOLD PT TO HOLD ON TO PAPER WORK UNTIL THEY TALKED TO MNS AND SEE WHAT SHE RECOMMENDS DUE TO PT BEING PREGNANT AND THE ZIKA VIRUS. SENT MESSAGE TO AMY AND TOLD THEM THAT SHE OR MNS WILL GET BACK TO THEM ABOUT WHAT IS RECOMMENDED PT CALLED 3 TIMES AFTER SHE LEFT THAT AFTERNOON AND WAS TOLD ALL 3 TIMES THAT AMY WILL CALL HER BACK WHEN SHE HAS SPOKEN TO MNS.  PT IS ALSO NEEDING HER [employer] PAPER WORKED FILLED OUT AND TURNED IN ASAP DUE TO SHE COULD LOSE HER JOB BECAUSE OF THE OFFICE VISIT SHE WILL HAVE TO ATTEND BEING PREGNANT. PT IS AWARE THAT THEY ARE BEING WORKED ON AND WILL BE SENT ASAP WHEN FINISHED.  PTS HUSBAND CAME IN ON 03/17/16 WITH THE PAPERS AGAIN STATING THAT THEY ARE GOING TO GO NO MATTER WHAT THE PROVIDER SAYS DUE TO THE FACT THAT HE CALLED THE CDC AND THEY TOLD HIM THAT THEY HAVE A GREATER RISK OF GETTING STRUCK BY LIGHTING THEN GETTING BIT, PTS HUSBAND ALSO STATED N 03/16/16 THAT HE WOULD JUST LIE TO CRUZ LINE AND TELL THEM SHE WAS NOT PREGNANT IF NEED BE, PT AND OR PTS HUSBAND WILL BE TOLD WHEN THEY COME IN TO GET THE PAPER WORK THAT WE ARE UNABLE TO LIE FOR THEM DUE TO LEGAL ISSUE. PTS HUSBAND STATED THAT THE PAPER WORK FOR THE CRUZ LINED HAD TO BE FILLED OUT TODAY 03/17/16 IN ORDER FOR THEM TO GO, THEY HAVE ALREADY HAD ONE EXTENSION FOR THE PAPER WORK, HE STATED HE WILL BE BACK BY 5:00 TO PICK IT UP.

## 2016-03-22 ENCOUNTER — Encounter: Payer: Self-pay | Admitting: Obstetrics and Gynecology

## 2016-04-08 ENCOUNTER — Ambulatory Visit (INDEPENDENT_AMBULATORY_CARE_PROVIDER_SITE_OTHER): Payer: 59 | Admitting: Obstetrics and Gynecology

## 2016-04-08 VITALS — BP 102/61 | HR 88 | Wt 149.6 lb

## 2016-04-08 DIAGNOSIS — Z3492 Encounter for supervision of normal pregnancy, unspecified, second trimester: Secondary | ICD-10-CM

## 2016-04-08 LAB — POCT URINALYSIS DIPSTICK
Bilirubin, UA: NEGATIVE
Blood, UA: NEGATIVE
GLUCOSE UA: NEGATIVE
Ketones, UA: NEGATIVE
Leukocytes, UA: NEGATIVE
Nitrite, UA: NEGATIVE
SPEC GRAV UA: 1.015
UROBILINOGEN UA: 0.2
pH, UA: 7.5

## 2016-04-08 MED ORDER — SERTRALINE HCL 50 MG PO TABS: 50.0000 mg | ORAL_TABLET | Freq: Every day | ORAL | 1 refills | Status: DC

## 2016-04-08 NOTE — Progress Notes (Signed)
ROB-to increase zoloft to 50mg , add claritin and saline NS. Anatomy scan next visit.

## 2016-04-08 NOTE — Progress Notes (Signed)
ROB- pt is wanting to go up on here zoloft, otherwise she is doing well

## 2016-04-21 ENCOUNTER — Encounter: Payer: Self-pay | Admitting: Cardiovascular Disease

## 2016-04-21 ENCOUNTER — Ambulatory Visit (INDEPENDENT_AMBULATORY_CARE_PROVIDER_SITE_OTHER): Payer: 59 | Admitting: Cardiovascular Disease

## 2016-04-21 VITALS — BP 100/60 | HR 92 | Ht 62.0 in | Wt 149.2 lb

## 2016-04-21 DIAGNOSIS — F410 Panic disorder [episodic paroxysmal anxiety] without agoraphobia: Secondary | ICD-10-CM | POA: Diagnosis not present

## 2016-04-21 DIAGNOSIS — R000 Tachycardia, unspecified: Secondary | ICD-10-CM | POA: Diagnosis not present

## 2016-04-21 DIAGNOSIS — R002 Palpitations: Secondary | ICD-10-CM | POA: Diagnosis not present

## 2016-04-21 DIAGNOSIS — I080 Rheumatic disorders of both mitral and aortic valves: Secondary | ICD-10-CM

## 2016-04-21 DIAGNOSIS — R42 Dizziness and giddiness: Secondary | ICD-10-CM

## 2016-04-21 NOTE — Patient Instructions (Signed)
Medication Instructions:   No medication changes made  Labwork:  No new labs needed  Testing/Procedures:  No further testing at this time   Follow-Up: It was a pleasure seeing you in the office today. Please call us if you have new issues that need to be addressed before your next appt.  [phone]  Your physician wants you to follow-up in: as needed  You will receive a reminder letter in the mail two months in advance. If you don't receive a letter, please call our office to schedule the follow-up appointment.  If you need a refill on your cardiac medications before your next appointment, please call your pharmacy.

## 2016-04-21 NOTE — Progress Notes (Signed)
Cardiology Office Note  Date:  04/21/2016   ID:  Katelyn Whitehead, DOB 12/10/1990, MRN [id number]  PCP:  Katelyn RightSusan Fisher, PA-C   CC: palpitations  HPI:  25 year old woman with history of anxiety, panic attacks, palpitations, and presyncope,  previous Holter monitor which showed sinus tachycardia and rare PVCs,  Previous echo showed mild AI and MR who presents for routine follow-up of her sinus tachycardia and palpitations  She is in her second trimester pregnancy Reports that she has chronic baseline elevated heart rate Periodically will note significant tachycardia, reports having heart rate up to 160 bpm Last night reports feeling lightheaded, had to lay back on the couch Husband presents with her today, reports she has significant anxiety She is concerned that something might happen to her baby, something might happen during delivery. She is concerned about previous diagnosis of mitral valve prolapse seen in 2012, aortic valve regurgitation noted that time.  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 She had lightheadedness in the early evening when standing up associated with tachycardia  She does not sleep well, takes clonazepam as needed This will slow her heart rate. Took one last night for tachycardia, improved symptoms  EKG on today's visit shows normal sinus rhythm with rate 92 bpm, no significant ST or T-wave changes  PMH:   has a past medical history of ADHD (attention deficit hyperactivity disorder); Anxiety; Depression; Mild mitral and aortic regurgitation; Palpitations; and Panic disorder.  PSH:    Past Surgical History:   Procedure Laterality Date  . WISDOM TOOTH EXTRACTION      Current Outpatient Prescriptions  Medication Sig Dispense Refill  . clonazePAM (KLONOPIN) 0.5 MG tablet Take 1 tablet (0.5 mg total) by mouth daily as needed for anxiety. 30 tablet 1  . ondansetron (ZOFRAN) 4 MG tablet Take 1 tablet (4 mg total) by mouth every 8 (eight) hours as needed for nausea or vomiting. 20 tablet 1  . pantoprazole (PROTONIX) 40 MG tablet Take 1 tablet (40 mg total) by mouth daily. 90 tablet 4  . Prenatal Vit-Fe Fumarate-FA (PRENATAL MULTIVITAMIN) TABS tablet Take 1 tablet by mouth daily at 12 noon.    . sertraline (ZOLOFT) 50 MG tablet Take 1 tablet (50 mg total) by mouth daily. 90 tablet 1   Current Facility-Administered Medications  Medication Dose Route Frequency Provider Last Rate Last Dose  . pantoprazole (PROTONIX) injection 40 mg  40 mg Intravenous Q24H Melody N Shambley, CNM         Allergies:   Sulfa antibiotics   Social History:  The patient  reports that she has never smoked. She has never used smokeless tobacco. She reports that she does not drink alcohol or use drugs.   Family History:   family history includes ADD / ADHD in her brother; Dementia in her maternal grandmother; Depression in her maternal aunt, maternal grandmother, and mother; Drug abuse in her father.    Review of Systems: Review of Systems  Psychiatric/Behavioral: The patient is nervous/anxious.      PHYSICAL EXAM: VS:  BP 100/60 (BP Location: Left Arm, Patient Position: Sitting, Cuff Size: Normal)   Pulse 92  Ht 5\' 2"  (1.575 m)   Wt 149 lb 4 oz (67.7 kg)   LMP 12/09/2015 (LMP Unknown) Comment: day pt miscarriage  BMI 27.30 kg/m  , BMI Body mass index is 27.3 kg/m. GEN: Well nourished, well developed, in no acute distress  HEENT: normal  Neck: no JVD, carotid bruits, or masses Cardiac: RRR; no murmurs, rubs, or gallops,no edema  Respiratory:  clear to auscultation bilaterally, normal work of breathing GI: soft,  nontender, nondistended, + BS MS: no deformity or atrophy  Skin: warm and dry, no rash Neuro:  Strength and sensation are intact Psych: euthymic mood, full affect    Recent Labs: 09/11/2015: Magnesium 2.3 09/18/2015: BUN 15; Creatinine, Ser 0.61; Potassium 3.7; Sodium 138 02/18/2016: Platelets 280    Lipid Panel No results found for: CHOL, HDL, LDLCALC, TRIG    Wt Readings from Last 3 Encounters:  04/21/16 149 lb 4 oz (67.7 kg)  04/08/16 149 lb 9.6 oz (67.9 kg)  03/04/16 148 lb 1.6 oz (67.2 kg)       ASSESSMENT AND PLAN:   Sinus tachycardia (HCC) Recommended more aggressive hydration. Husband reports often does not drink fluids like she should. If she does have lightheadedness concerning for orthostasis, Recommended she sit or lay down, she can wear compression hose If associated with sinus tachycardia, would follow stress reduction techniques, deep breathing, Consider clonazepam if needed for panic attack --Given she is in her second trimester pregnancy, would not start beta blockers or calcium channel blockers. Would only use medications if symptoms are severe. Currently most of her symptoms seem to be controlled by managing her anxiety  Episodic paroxysmal anxiety disorder As above, she reports feeling better with clonazepam Also reports doing better on Paxil rather than Zoloft Recommended she discuss this with primary care  Mild mitral and aortic regurgitation Most recent echocardiogram reviewed with her, only trace MR, AI with no prolapse, normal ejection fraction, no other structural abnormality. No further echocardiograms needed  Dizziness Dizziness likely from orthostasis. Recommended she stay hydrated. Certainly possible she has drop in blood pressure with sinus tachycardia In these situations would sit down or lay down, hydrate, compression hose if needed, anxiety medication if needed Would avoid beta blockers during pregnancy, even while breast-feeding given  risk to the fetus, slowing of growth   Total encounter time more than 25 minutes  Greater than 50% was spent in counseling and coordination of care with the patient   Disposition:   F/U as needed   Orders Placed This Encounter  Procedures  . EKG 12-Lead     Signed, Dossie Arbour, M.D., Ph.D. 04/21/2016  [facility] [place], [place] [phone]

## 2016-04-28 ENCOUNTER — Ambulatory Visit (INDEPENDENT_AMBULATORY_CARE_PROVIDER_SITE_OTHER): Payer: 59

## 2016-04-28 DIAGNOSIS — Z3492 Encounter for supervision of normal pregnancy, unspecified, second trimester: Secondary | ICD-10-CM | POA: Diagnosis not present

## 2016-04-30 ENCOUNTER — Ambulatory Visit (INDEPENDENT_AMBULATORY_CARE_PROVIDER_SITE_OTHER): Payer: 59 | Admitting: Psychiatry

## 2016-04-30 ENCOUNTER — Encounter: Payer: Self-pay | Admitting: Psychiatry

## 2016-04-30 VITALS — BP 114/76 | HR 112 | Temp 98.4°F | Ht 62.0 in | Wt 153.0 lb

## 2016-04-30 DIAGNOSIS — F410 Panic disorder [episodic paroxysmal anxiety] without agoraphobia: Secondary | ICD-10-CM | POA: Diagnosis not present

## 2016-04-30 MED ORDER — CLONAZEPAM 0.5 MG PO TABS: 0.5000 mg | ORAL_TABLET | Freq: Every day | ORAL | 1 refills | Status: DC | PRN

## 2016-04-30 NOTE — Progress Notes (Signed)
BH MD/PA/NP OP Progress Note  04/30/2016 9:52 AM Katelyn Whitehead  MRN:  [id number]  Subjective:   Patient is a 25 year old married female who presented for the follow-up appointment. She reported she is [redacted] weeks  pregnant and her pregnancy is progressing well. She reported that she is becoming very anxious and has fears of dying. She was switched to Zoloft by her OB and reported that she does not feel well on the medication. Patient reported that she takes Klonopin at night on a regular basis. She appeared apprehensive during the interview. She reported that she feels that the Zoloft is not helping her as she was started on 25 mg and recently increased to 50 mg she has not started taking the dose.. 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 Patient stated that she stays at home most of the time and is only working 2 days per week. We discussed about doing therapy on a regular basis and she agreed with the plan. She is compliant with her medications. She denied having any perceptual disturbances. She does not have any thoughts to hurt herself or her baby at this time.   We have tried several medications in the past. We discussed about her medications in detail. Patient reported that she is following with her OB appointments on a regular basis.     Chief Complaint:  Chief Complaint    Follow-up; Medication Refill; Medication Problem; Anxiety; Panic Attack     Visit Diagnosis:     ICD-9-CM ICD-10-CM   1. Panic disorder 300.01 F41.0     Past Medical History:  Past Medical History:  Diagnosis Date  . ADHD (attention deficit hyperactivity disorder)   . Anxiety   . Depression   . Mild mitral and aortic regurgitation    a. 04/2013 Echo: EF 50-55%, mild AI, mild MR, no MVP.  Marland Kitchen Palpitations    a. 2012 Holter: sinus tach, pvc's.  . Panic disorder     Past Surgical History:  Procedure Laterality Date  .  WISDOM TOOTH EXTRACTION     Family History:  Family History  Problem Relation Age of Onset  . Depression Mother   . Drug abuse Father   . ADD / ADHD Brother   . Depression Maternal Aunt   . Dementia Maternal Grandmother   . Depression Maternal Grandmother    Social History:  Social History   Social History  . Marital status: Married    Spouse name: N/A  . Number of children: N/A  . Years of education: N/A   Social History Main Topics  . Smoking status: Never Smoker  . Smokeless tobacco: Never Used  . Alcohol use No  . Drug use: No  . Sexual activity: Yes    Birth control/ protection: None   Other Topics Concern  . None   Social History Narrative  . None   Additional History:  Currently married and lives with her husband and has good relationship with him.  Assessment:   Musculoskeletal: Strength & Muscle Tone: within normal limits Gait & Station: normal Patient leans: N/A  Psychiatric Specialty Exam: Depression         Past medical history includes anxiety.   Anxiety       Review of Systems  Psychiatric/Behavioral: Positive for depression.    Blood pressure 114/76, pulse (!) 112, temperature 98.4 F (36.9 C), temperature source Oral,  height 5\' 2"  (1.575 m), weight 153 lb (69.4 kg), last menstrual period 12/09/2015.Body mass index is 27.98 kg/m.  General Appearance: Casual and Fairly Groomed  Eye Contact:  Fair  Speech:  Clear and Coherent  Volume:  Normal  Mood:  Anxious  Affect:  Congruent and Full Range  Thought Process:  Coherent  Orientation:  Full (Time, Place, and Person)  Thought Content:  WDL  Suicidal Thoughts:  No  Homicidal Thoughts:  No  Memory:  Immediate;   Fair  Judgement:  Fair  Insight:  Fair  Psychomotor Activity:  Normal  Concentration:  Fair  Recall:  FiservFair  Fund of Knowledge: Fair  Language: Fair  Akathisia:  No  Handed:  Right  AIMS (if indicated):    Assets:  Communication Skills Desire for Improvement Physical  Health  ADL's:  Intact  Cognition: WNL  Sleep:  7   Is the patient at risk to self?  No. Has the patient been a risk to self in the past 6 months?  No. Has the patient been a risk to self within the distant past?  No. Is the patient a risk to others?  No. Has the patient been a risk to others in the past 6 months?  No. Has the patient been a risk to others within the distant past?  No.  Current Medications: Current Outpatient Prescriptions  Medication Sig Dispense Refill  . clonazePAM (KLONOPIN) 0.5 MG tablet Take 1 tablet (0.5 mg total) by mouth daily as needed for anxiety. 1/2 pill at night x 2 weeks 15 tablet 1  . ondansetron (ZOFRAN) 4 MG tablet Take 1 tablet (4 mg total) by mouth every 8 (eight) hours as needed for nausea or vomiting. 20 tablet 1  . pantoprazole (PROTONIX) 40 MG tablet Take 1 tablet (40 mg total) by mouth daily. 90 tablet 4  . Prenatal Vit-Fe Fumarate-FA (PRENATAL MULTIVITAMIN) TABS tablet Take 1 tablet by mouth daily at 12 noon.    . sertraline (ZOLOFT) 50 MG tablet Take 1 tablet (50 mg total) by mouth daily. 90 tablet 1   Current Facility-Administered Medications  Medication Dose Route Frequency Provider Last Rate Last Dose  . pantoprazole (PROTONIX) injection 40 mg  40 mg Intravenous Q24H Melody Suzan NailerN Shambley, CNM        Medical Decision Making:  Established Problem, Stable/Improving (1) and Review of Psycho-Social Stressors (1)  Treatment Plan Summary:Medication management   Discussed with patient what the medications treatment risks benefits and alternatives.  Continue Zoloft 50 mg qdaily.  Advised her to decrease use of Klonopin and take half a pill in the evening on . Medication refill for the next 15 days. Will also start therapy on a regular basis. Follow-up in 2  weeks or earlier depending on her symptoms She will also follow-up with therapy appointments to help with her anxiety symptoms.  Advised patient to call if she notices worsening of her  symptoms and she demonstrated understanding   This note was generated in part or whole with voice recognition software. Voice regonition is usually quite accurate but there are transcription errors that can and very often do occur. I apologize for any typographical errors that were not detected and corrected.    Brandy HaleUzma Orel Cooler, MD  04/30/2016, 9:52 AM

## 2016-05-01 ENCOUNTER — Encounter: Payer: Self-pay | Admitting: *Deleted

## 2016-05-01 ENCOUNTER — Other Ambulatory Visit: Payer: Self-pay | Admitting: Obstetrics and Gynecology

## 2016-05-01 ENCOUNTER — Observation Stay
Admission: EM | Admit: 2016-05-01 | Discharge: 2016-05-01 | Disposition: A | Discharge status: Home / Self Care | Payer: 59 | Attending: Obstetrics and Gynecology | Admitting: Obstetrics and Gynecology

## 2016-05-01 DIAGNOSIS — R319 Hematuria, unspecified: Secondary | ICD-10-CM | POA: Diagnosis not present

## 2016-05-01 DIAGNOSIS — O9989 Other specified diseases and conditions complicating pregnancy, childbirth and the puerperium: Principal | ICD-10-CM | POA: Insufficient documentation

## 2016-05-01 DIAGNOSIS — Z283 Underimmunization status: Secondary | ICD-10-CM

## 2016-05-01 DIAGNOSIS — Z2839 Other underimmunization status: Secondary | ICD-10-CM

## 2016-05-01 DIAGNOSIS — Z3A20 20 weeks gestation of pregnancy: Secondary | ICD-10-CM | POA: Insufficient documentation

## 2016-05-01 DIAGNOSIS — O09899 Supervision of other high risk pregnancies, unspecified trimester: Secondary | ICD-10-CM

## 2016-05-01 DIAGNOSIS — O2342 Unspecified infection of urinary tract in pregnancy, second trimester: Secondary | ICD-10-CM | POA: Diagnosis present

## 2016-05-01 LAB — URINALYSIS COMPLETE WITH MICROSCOPIC (ARMC ONLY)
BACTERIA UA: NONE SEEN
BILIRUBIN URINE: NEGATIVE
Glucose, UA: NEGATIVE mg/dL
KETONES UR: NEGATIVE mg/dL
LEUKOCYTES UA: NEGATIVE
NITRITE: NEGATIVE
PROTEIN: NEGATIVE mg/dL
SPECIFIC GRAVITY, URINE: 1.012 (ref 1.005–1.030)
WBC UA: NONE SEEN WBC/hpf (ref 0–5)
pH: 7 (ref 5.0–8.0)

## 2016-05-01 MED ORDER — NITROFURANTOIN MONOHYD MACRO 100 MG PO CAPS
100.0000 mg | ORAL_CAPSULE | Freq: Two times a day (BID) | ORAL | Status: DC
  Administered 2016-05-01: 100 mg via ORAL
  Filled 2016-05-01: qty 1

## 2016-05-01 MED ORDER — NITROFURANTOIN MONOHYD MACRO 100 MG PO CAPS: 100.0000 mg | ORAL_CAPSULE | Freq: Two times a day (BID) | ORAL | 1 refills | Status: DC

## 2016-05-01 NOTE — OB Triage Note (Signed)
Abdominal and back pain since yesterday evening. Reports feeling bladder pressure with increased feeling of need to urinate. Reports lower back pain that comes and goes. Elaina HoopsElks, Reisha Wos S

## 2016-05-01 NOTE — OB Triage Provider Note (Signed)
L&D OB Triage Note  Katelyn Whitehead is a 25 y.o. G2P0010 female at 6931w4d, EDD Estimated Date of Delivery: 09/14/16 who presented to triage for complaints of increased urinary frequency and bladder pressure since last night, worse this am..  She was evaluated by the nurses with no significant findings/findings significant for PTL, but does appear to have UTI. Vital signs stable.  She was treated with po fluid and one dose macrobid.   NST INTERPRETATION: Indications: rule out uterine contractions  Mode: Doppler Baseline Rate (A): 150 bpm           Contraction Frequency (min): none noted  Impression: hematuria and suspected UTI   Plan: EFM for contractions was negative, gestational age too early for true NST. She was discharged home with labor precautions.  Continue routine prenatal care. RX for SunGardMacrobid sent in. Will call with urine cultre results. Follow up with OB/GYN as previously scheduled.     Katelyn Whitehead, CNM

## 2016-05-02 LAB — URINE CULTURE
Culture: <10000 — AB
SPECIAL REQUESTS: NORMAL

## 2016-05-03 ENCOUNTER — Telehealth: Payer: Self-pay | Admitting: Obstetrics and Gynecology

## 2016-05-03 NOTE — Telephone Encounter (Signed)
PT CALLED AND SAID THAT THE [facility] IS CLOSED SO SHE WANTED HER ZOLOFT TO BE CALLED IN TO THE [facility] ON [address].

## 2016-05-03 NOTE — Telephone Encounter (Signed)
Pt called back an I told her that you had left early today and that [facility] does not work on Mondays, so I told her that she should call our pharmacy in the morning and see if they are going to be open since they were closed today for renovations, and if they are not open then to call back and I will let you know so you can send it to the [facility] on [address]

## 2016-05-04 ENCOUNTER — Other Ambulatory Visit: Payer: Self-pay | Admitting: *Deleted

## 2016-05-04 ENCOUNTER — Telehealth: Payer: Self-pay | Admitting: Obstetrics and Gynecology

## 2016-05-04 ENCOUNTER — Encounter: Payer: Self-pay | Admitting: Obstetrics and Gynecology

## 2016-05-04 MED ORDER — SERTRALINE HCL 50 MG PO TABS: 50.0000 mg | ORAL_TABLET | Freq: Every day | ORAL | 1 refills | Status: DC

## 2016-05-04 NOTE — Telephone Encounter (Signed)
Notified pt. 

## 2016-05-04 NOTE — Telephone Encounter (Signed)
This pt called and said she  has a flat tire and could not come for her appt.. She was in hospital for blladder infection and was hoping to see melody today. Mel's next appt is 10/4 and that's where I put her, If she needs to be seen sooner let me know.

## 2016-05-06 ENCOUNTER — Telehealth: Payer: Self-pay | Admitting: Obstetrics and Gynecology

## 2016-05-06 NOTE — Telephone Encounter (Signed)
Pt is [redacted] wk pregnant. She isn't feel good today is very weak, when she eats she feels like she is going to throw up. Her legs are very heavy.

## 2016-05-06 NOTE — Telephone Encounter (Signed)
Called pt and advised her to hydrate as much as possible and try to stay away from foods that are making her sick on her stomach, pt voiced understanding

## 2016-05-11 ENCOUNTER — Ambulatory Visit (INDEPENDENT_AMBULATORY_CARE_PROVIDER_SITE_OTHER): Payer: 59 | Admitting: Psychiatry

## 2016-05-11 ENCOUNTER — Ambulatory Visit (INDEPENDENT_AMBULATORY_CARE_PROVIDER_SITE_OTHER): Payer: 59 | Admitting: Licensed Clinical Social Worker

## 2016-05-11 ENCOUNTER — Encounter: Payer: Self-pay | Admitting: Psychiatry

## 2016-05-11 VITALS — BP 114/73 | HR 102 | Temp 97.8°F | Wt 152.0 lb

## 2016-05-11 DIAGNOSIS — F410 Panic disorder [episodic paroxysmal anxiety] without agoraphobia: Secondary | ICD-10-CM

## 2016-05-11 MED ORDER — HALOPERIDOL 0.5 MG PO TABS: 0.5000 mg | ORAL_TABLET | Freq: Every day | ORAL | 1 refills | Status: DC

## 2016-05-11 MED ORDER — CITALOPRAM HYDROBROMIDE 20 MG PO TABS: 20.0000 mg | ORAL_TABLET | Freq: Every day | ORAL | 1 refills | Status: DC

## 2016-05-11 NOTE — Progress Notes (Signed)
Comprehensive Clinical Assessment (CCA) Note  05/11/2016 Katelyn Whitehead [id number]  Visit Diagnosis:   No diagnosis found.    CCA Part One  Part One has been completed on paper by the patient.  (See scanned document in Chart Review)  CCA Part Two A  Intake/Chief Complaint:  CCA Intake With Chief Complaint CCA Part Two Date: 05/11/16 CCA Part Two Time: 1107 Chief Complaint/Presenting Problem: "I have a fear of dying and I am currently pregnant." Patients Currently Reported Symptoms/Problems: I am unable to work.  I can't sleep.  I can't drive.  I feel like I am going to passout and my body shakes on the inside.  I tremble.  My anxiety has gotten worse since pregnancy.  Poor appetite.  I am worried something will go wrong.  My head is foggy. I can't cut off my racing mind.  Individual's Strengths: I love people.   Individual's Preferences: To stop feeling panicky.  To be normal and feel relaxed Individual's Abilities: communication Type of Services Patient Feels Are Needed: anything to stop the panicky feeling  Mental Health Symptoms Depression:  Depression: Irritability, Fatigue, Difficulty Concentrating, Change in energy/activity  Mania:  Mania: N/A  Anxiety:   Anxiety: Worrying, Sleep, Irritability, Fatigue, Difficulty concentrating  Psychosis:  Psychosis: N/A  Trauma:  Trauma: N/A  Obsessions:  Obsessions: N/A  Compulsions:  Compulsions: N/A  Inattention:  Inattention: N/A  Hyperactivity/Impulsivity:  Hyperactivity/Impulsivity: N/A  Oppositional/Defiant Behaviors:  Oppositional/Defiant Behaviors: N/A  Borderline Personality:  Emotional Irregularity: N/A  Other Mood/Personality Symptoms:      Mental Status Exam Appearance and self-care  Stature:  Stature: Average  Weight:  Weight: Average weight  Clothing:  Clothing: Neat/clean  Grooming:  Grooming: Normal  Cosmetic use:  Cosmetic Use: Age appropriate  Posture/gait:  Posture/Gait: Normal  Motor activity:  Motor  Activity: Not Remarkable  Sensorium  Attention:  Attention: Normal  Concentration:  Concentration: Normal  Orientation:  Orientation: X5  Recall/memory:  Recall/Memory: Normal  Affect and Mood  Affect:  Affect: Appropriate  Mood:  Mood: Anxious  Relating  Eye contact:  Eye Contact: Normal  Facial expression:  Facial Expression: Responsive  Attitude toward examiner:  Attitude Toward Examiner: Cooperative  Thought and Language  Speech flow: Speech Flow: Normal  Thought content:  Thought Content: Appropriate to mood and circumstances  Preoccupation:     Hallucinations:     Organization:     Company secretaryxecutive Functions  Fund of Knowledge:  Fund of Knowledge: Average  Intelligence:  Intelligence: Average  Abstraction:  Abstraction: Normal  Judgement:  Judgement: Normal  Reality Testing:  Reality Testing: Adequate  Insight:  Insight: Good  Decision Making:  Decision Making: Normal  Social Functioning  Social Maturity:  Social Maturity: Responsible  Social Judgement:  Social Judgement: Normal  Stress  Stressors:  Stressors:  (thoughts of dying, house being unkept, )  Coping Ability:  Coping Ability: Building surveyorverwhelmed  Skill Deficits:     Supports:      Family and Psychosocial History: Family history Marital status: Married Number of Years Married: 7 What types of issues is patient dealing with in the relationship?: "me being panicky" Additional relationship information: He is tired of me not wantng to go anywhere or do anything Are you sexually active?: Yes What is your sexual orientation?: heterosexual Does patient have children?: No (currently pregnant)  Childhood History:  Childhood History By whom was/is the patient raised?: Mother/father and step-parent Additional childhood history information: Describes childhood: my dad wasnt around much.  He was on drugs. My mom is a good mom and my step dad was good too.  He was strict. Description of patient's relationship with caregiver when  they were a child: Mother: "It was good. I felt like I could talk to her." Bio Father "No. Not good.  I never trusted him.  He had an addiction to drugs."  Stepdad: "It was good.  He was strict but he loves me." Patient's description of current relationship with people who raised him/her: Mother: "It is good.  Bio Father: "I talk to him sometimes.  I ignore his phone calls.  He talks about himself a lot."  Stepdad: "It is good. He is good to me.  He hugs me, hugs me and prays for me." How were you disciplined when you got in trouble as a child/adolescent?: stuff taken away Does patient have siblings?: Yes Number of Siblings:  Carollee Herter 30, Jessica 17, twin step sisters Macie & Lequita Halt 20, Stepbrother Gerri Spore 23) Description of patient's current relationship with siblings: Penelope Coop close to my sister Carollee Herter 1-2 times weekly; I talk to Deputy 1-2 times weekly.  My other siblings I love them but we don't talk much Did patient suffer any verbal/emotional/physical/sexual abuse as a child?: No Did patient suffer from severe childhood neglect?: No Has patient ever been sexually abused/assaulted/raped as an adolescent or adult?: No Was the patient ever a victim of a crime or a disaster?: No Witnessed domestic violence?: No Has patient been effected by domestic violence as an adult?: No  CCA Part Two B  Employment/Work Situation: Employment / Work Psychologist, occupational Employment situation: Employed Where is patient currently employed?: [employer] How long has patient been employed?: 3 years Patient's job has been impacted by current illness: Yes Describe how patient's job has been impacted: unable to work due to anxiety and panic attacks What is the longest time patient has a held a job?: 42yrs 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 Has patient ever been in the [employer]?: No  Education: Education Name of [employer]: [employer] Did Garment/textile technologist From [employer]?: Yes Did Engineer, water?: Yes What Type of College Degree Do you Have?: did not complete Did You Have An Individualized Education Program (IIEP): No Did You Have Any Difficulty At [employer]?: No  Religion: Religion/Spirituality Are You A Religious Person?: Yes What is Your Religious Affiliation?: Christian How Might This Affect Treatment?: denies  Leisure/Recreation: Leisure / Recreation Leisure and Hobbies: getting nails done, shopping  Exercise/Diet: Exercise/Diet Do You Exercise?: No Have You Gained or Lost A Significant Amount of Weight in the Past Six Months?: No Do You Follow a Special Diet?: No Do You Have Any Trouble Sleeping?: Yes Explanation of Sleeping Difficulties: unable to stay asleep  CCA Part Two C  Alcohol/Drug Use: Alcohol / Drug Use Pain Medications: denies Prescriptions: Zoloft, Clonopin Over the Counter: Benadryl, Vitamin History of alcohol / drug use?: No history of alcohol / drug abuse                      CCA Part Three  ASAM's:  Six Dimensions of Multidimensional Assessment  Dimension 1:  Acute Intoxication and/or Withdrawal Potential:     Dimension 2:  Biomedical Conditions and Complications:     Dimension 3:  Emotional, Behavioral, or Cognitive Conditions and Complications:     Dimension 4:  Readiness to Change:     Dimension 5:  Relapse, Continued use, or  Continued Problem Potential:     Dimension 6:  Recovery/Living Environment:      Substance use Disorder (SUD)    Social Function:  Social Functioning Social Maturity: Responsible Social Judgement: Normal  Stress:  Stress Stressors:  (thoughts of dying, house being unkept, ) Coping Ability: Overwhelmed Patient Takes Medications The Way The Doctor Instructed?: Yes Priority Risk: Moderate Risk  Risk Assessment- Self-Harm Potential: Risk Assessment For Self-Harm Potential Thoughts of Self-Harm: No current thoughts Method: No plan Availability of Means: No access/NA  Risk Assessment  -Dangerous to Others Potential: Risk Assessment For Dangerous to Others Potential Method: No Plan Availability of Means: No access or NA Intent: Vague intent or NA Notification Required: No need or identified person  DSM5 Diagnoses: Patient Active Problem List   Diagnosis Date Noted  . Indication for care in labor and delivery, antepartum 05/01/2016  . Rubella non-immune status, antepartum 02/25/2016  . Maternal varicella, non-immune 02/25/2016  . Mild mitral and aortic regurgitation   . Panic disorder   . ADD (attention deficit disorder) 12/04/2014  . Depression, major, recurrent, moderate (HCC) 12/04/2014  . Episodic paroxysmal anxiety disorder 12/04/2014  . Aortic insufficiency 04/05/2013  . Mitral valve prolapse 02/24/2011    Patient Centered Plan: Patient is on the following Treatment Plan(s):  Anxiety  Recommendations for Services/Supports/Treatments: Recommendations for Services/Supports/Treatments Recommendations For Services/Supports/Treatments: Individual Therapy, Medication Management  Treatment Plan Summary:    Referrals to Alternative Service(s): Referred to Alternative Service(s):   Place:   Date:   Time:    Referred to Alternative Service(s):   Place:   Date:   Time:    Referred to Alternative Service(s):   Place:   Date:   Time:    Referred to Alternative Service(s):   Place:   Date:   Time:     Marinda Elk

## 2016-05-11 NOTE — Progress Notes (Signed)
BH MD/PA/NP OP Progress Note  05/11/2016 12:19 PM Katelyn Whitehead  MRN:  [id number]  Subjective:   Patient is a 25 year old married female who presented for the follow-up appointment. She reported she is [redacted] weeks  pregnant and her pregnancy is progressing well. She reported that she is becoming very anxious and was unable to tolerate the Zoloft. She brought the bottle back and reported that she has already discussed with her nurse practitioner at [facility] office and they're fine with her switching the medications. She reported that she has not been able to go back to work. Patient reported that she has taken Zoloft for almost 8 weeks and it made her anxiety worse.  She was switched to Zoloft by her OB and reported that she does not feel well on the medication. He is interested in switching her medications again. She reported that she has taken Celexa in the past and it worked well. 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 She reported that she wants medications to help her feel better. She appeared apprehensive during the interview. She reported that the Klonopin does not work at all.  She sleeps well at night. She does not have any thoughts to hurt herself or her baby at this time. She has gained 7 pounds so far.   . She appeared apprehensive during the interview.  She does not have any thoughts to hurt herself or her baby at this time.   We have tried several medications in the past. We discussed about her medications in detail. Patient reported that she is following with her OB appointments on a regular basis.     Chief Complaint:  Chief Complaint    Follow-up; Medication Refill     Visit Diagnosis:     ICD-9-CM ICD-10-CM   1. Panic disorder 300.01 F41.0     Past Medical History:  Past Medical History:  Diagnosis Date  . ADHD (attention deficit hyperactivity disorder)   . Anxiety   . Depression   . Mild  mitral and aortic regurgitation    a. 04/2013 Echo: EF 50-55%, mild AI, mild MR, no MVP.  Marland Kitchen Palpitations    a. 2012 Holter: sinus tach, pvc's.  . Panic disorder     Past Surgical History:  Procedure Laterality Date  . WISDOM TOOTH EXTRACTION     Family History:  Family History  Problem Relation Age of Onset  . Depression Mother   . Drug abuse Father   . ADD / ADHD Brother   . Depression Maternal Aunt   . Dementia Maternal Grandmother   . Depression Maternal Grandmother    Social History:  Social History   Social History  . Marital status: Married    Spouse name: N/A  . Number of children: N/A  . Years of education: N/A   Social History Main Topics  . Smoking status: Never Smoker  . Smokeless tobacco: Never Used  . Alcohol use No  . Drug use: No  . Sexual activity: Yes    Birth control/ protection: None   Other Topics Concern  . None   Social History Narrative  . None   Additional History:  Currently married and lives with her husband and has good relationship with him.  Assessment:   Musculoskeletal: Strength & Muscle Tone: within normal limits Gait & Station: normal Patient leans: N/A  Psychiatric Specialty Exam: Depression  Past medical history includes anxiety.   Anxiety       Review of Systems  Psychiatric/Behavioral: Positive for depression.    Blood pressure 114/73, pulse (!) 102, temperature 97.8 F (36.6 C), temperature source Oral, weight 152 lb (68.9 kg), last menstrual period 12/09/2015.Body mass index is 27.8 kg/m.  General Appearance: Casual and Fairly Groomed  Eye Contact:  Fair  Speech:  Clear and Coherent  Volume:  Normal  Mood:  Anxious  Affect:  Congruent and Full Range  Thought Process:  Coherent  Orientation:  Full (Time, Place, and Person)  Thought Content:  WDL  Suicidal Thoughts:  No  Homicidal Thoughts:  No  Memory:  Immediate;   Fair  Judgement:  Fair  Insight:  Fair  Psychomotor Activity:  Normal   Concentration:  Fair  Recall:  FiservFair  Fund of Knowledge: Fair  Language: Fair  Akathisia:  No  Handed:  Right  AIMS (if indicated):    Assets:  Communication Skills Desire for Improvement Physical Health  ADL's:  Intact  Cognition: WNL  Sleep:  7   Is the patient at risk to self?  No. Has the patient been a risk to self in the past 6 months?  No. Has the patient been a risk to self within the distant past?  No. Is the patient a risk to others?  No. Has the patient been a risk to others in the past 6 months?  No. Has the patient been a risk to others within the distant past?  No.  Current Medications: Current Outpatient Prescriptions  Medication Sig Dispense Refill  . acetaminophen (TYLENOL) 500 MG tablet Take 500 mg by mouth every 6 (six) hours as needed.    . citalopram (CELEXA) 20 MG tablet Take 1 tablet (20 mg total) by mouth daily. 15 tablet 1  . clonazePAM (KLONOPIN) 0.5 MG tablet Take 1 tablet (0.5 mg total) by mouth daily as needed for anxiety. 1/2 pill at night x 2 weeks 15 tablet 1  . diphenhydrAMINE (BENADRYL) 25 mg capsule Take 25 mg by mouth at bedtime as needed for sleep.    . haloperidol (HALDOL) 0.5 MG tablet Take 1 tablet (0.5 mg total) by mouth at bedtime. 1/2- 1 pills po qhs 15 tablet 1  . nitrofurantoin, macrocrystal-monohydrate, (MACROBID) 100 MG capsule Take 1 capsule (100 mg total) by mouth 2 (two) times daily. 14 capsule 1  . pantoprazole (PROTONIX) 40 MG tablet Take 1 tablet (40 mg total) by mouth daily. 90 tablet 4  . Prenatal Vit-Fe Fumarate-FA (PRENATAL MULTIVITAMIN) TABS tablet Take 1 tablet by mouth daily at 12 noon.     Current Facility-Administered Medications  Medication Dose Route Frequency Provider Last Rate Last Dose  . pantoprazole (PROTONIX) injection 40 mg  40 mg Intravenous Q24H Melody Suzan NailerN Shambley, CNM        Medical Decision Making:  Established Problem, Stable/Improving (1) and Review of Psycho-Social Stressors (1)  Treatment Plan  Summary:Medication management   Discussed with patient what the medications treatment risks benefits and alternatives.  D/C Zoloft Patient has supply of Klonopin.  I will start her on Haldol 0.25- 0.5 by mouth daily at bedtime to help with her paranoia and racing thoughts at night.  She will also be started on Celexa 20 mg daily.  Patient will follow up in 1 week or earlier depending on her symptoms   She will continue to follow-up with her OB on a regular basis   Advised her to decrease use  of Klonopin and take half a pill in the evening on .   She will also follow-up with therapy appointments to help with her anxiety symptoms.  Advised patient to call if she notices worsening of her symptoms and she demonstrated understanding   This note was generated in part or whole with voice recognition software. Voice regonition is usually quite accurate but there are transcription errors that can and very often do occur. I apologize for any typographical errors that were not detected and corrected.    Brandy Hale, MD  05/11/2016, 12:19 PM

## 2016-05-12 ENCOUNTER — Ambulatory Visit (INDEPENDENT_AMBULATORY_CARE_PROVIDER_SITE_OTHER): Payer: 59 | Admitting: Obstetrics and Gynecology

## 2016-05-12 VITALS — BP 106/65 | HR 110 | Wt 150.9 lb

## 2016-05-12 DIAGNOSIS — Z3492 Encounter for supervision of normal pregnancy, unspecified, second trimester: Secondary | ICD-10-CM

## 2016-05-12 LAB — POCT URINALYSIS DIPSTICK
Bilirubin, UA: NEGATIVE
Glucose, UA: NEGATIVE
Ketones, UA: NEGATIVE
LEUKOCYTES UA: NEGATIVE
Nitrite, UA: NEGATIVE
PH UA: 7.5
UROBILINOGEN UA: 0.2

## 2016-05-12 MED ORDER — CLONAZEPAM 0.5 MG PO TABS: 0.5000 mg | ORAL_TABLET | Freq: Three times a day (TID) | ORAL | 5 refills | Status: DC | PRN

## 2016-05-12 NOTE — Progress Notes (Signed)
ROB- pt is depressed, is so afraid something is going to happen to her, she doesn't feel like herself Would like b-12 and vit d checked

## 2016-05-12 NOTE — Progress Notes (Signed)
ROB- patient burst into tears upon entering the room, states she feels very anxious and can't get it under control. Has not been able to work or barely leave the house since Sept 23rd. Saw psychiatrist yesterday and meds were switched from zoloft back to celexa. Does state anxiety rescue med helps but waits as long as she can to take it. It shaking and figity. Spouse is here and supportive. Denies harmful ideations, just tired of feeling this way.  Depression screen PHQ 2/9 05/12/2016  Decreased Interest 3  Down, Depressed, Hopeless 3  PHQ - 2 Score 6  Altered sleeping 2  Tired, decreased energy 0  Change in appetite 1  Feeling bad or failure about yourself  2  Trouble concentrating 3  Moving slowly or fidgety/restless 2  Suicidal thoughts 0  PHQ-9 Score 16  reassured her about fetus status. Refilled clonazepam and instructed patient to take whole tablet tid for next week then as needed to allow new SSRI to work. Note given to excuse from work. Recommended Shortterm disability starting and needs to have psychiatrist fill out papers. Will return in 2-3 weeks to recheck meds. Labs obtained to r/o previous vit. Deficiency involvement.

## 2016-05-13 ENCOUNTER — Other Ambulatory Visit: Payer: Self-pay | Admitting: Obstetrics and Gynecology

## 2016-05-13 DIAGNOSIS — E538 Deficiency of other specified B group vitamins: Secondary | ICD-10-CM

## 2016-05-13 DIAGNOSIS — E559 Vitamin D deficiency, unspecified: Secondary | ICD-10-CM | POA: Insufficient documentation

## 2016-05-13 LAB — COMPREHENSIVE METABOLIC PANEL
ALBUMIN: 3.8 g/dL (ref 3.5–5.5)
ALK PHOS: 67 IU/L (ref 39–117)
ALT: 10 IU/L (ref 0–32)
AST: 16 IU/L (ref 0–40)
Albumin/Globulin Ratio: 1.2 (ref 1.2–2.2)
BUN / CREAT RATIO: 16 (ref 9–23)
BUN: 9 mg/dL (ref 6–20)
CHLORIDE: 100 mmol/L (ref 96–106)
CO2: 21 mmol/L (ref 18–29)
Calcium: 9 mg/dL (ref 8.7–10.2)
Creatinine, Ser: 0.56 mg/dL — ABNORMAL LOW (ref 0.57–1.00)
GFR calc Af Amer: 148 mL/min/{1.73_m2} (ref >59)
GFR calc non Af Amer: 128 mL/min/{1.73_m2} (ref >59)
GLOBULIN, TOTAL: 3.3 g/dL (ref 1.5–4.5)
Glucose: 86 mg/dL (ref 65–99)
Potassium: 3.5 mmol/L (ref 3.5–5.2)
SODIUM: 139 mmol/L (ref 134–144)
Total Protein: 7.1 g/dL (ref 6.0–8.5)

## 2016-05-13 LAB — VITAMIN D 25 HYDROXY (VIT D DEFICIENCY, FRACTURES): Vit D, 25-Hydroxy: 26.4 ng/mL — ABNORMAL LOW (ref 30.0–100.0)

## 2016-05-13 LAB — B12 AND FOLATE PANEL
Folate: 16.1 ng/mL (ref >3.0)
VITAMIN B 12: 175 pg/mL — AB (ref 211–946)

## 2016-05-13 MED ORDER — FOLIC ACID 1 MG PO TABS: 1.0000 mg | ORAL_TABLET | Freq: Every day | ORAL | 4 refills | Status: DC

## 2016-05-13 MED ORDER — VITAMIN D (ERGOCALCIFEROL) 1.25 MG (50000 UNIT) PO CAPS: 50000.0000 [IU] | ORAL_CAPSULE | ORAL | 1 refills | Status: DC

## 2016-05-13 MED ORDER — CYANOCOBALAMIN 1000 MCG/ML IJ SOLN: 1000.0000 ug | INTRAMUSCULAR | 1 refills | Status: DC

## 2016-05-14 ENCOUNTER — Telehealth: Payer: Self-pay | Admitting: Cardiovascular Disease

## 2016-05-14 ENCOUNTER — Ambulatory Visit (INDEPENDENT_AMBULATORY_CARE_PROVIDER_SITE_OTHER): Payer: 59 | Admitting: Obstetrics and Gynecology

## 2016-05-14 DIAGNOSIS — E538 Deficiency of other specified B group vitamins: Secondary | ICD-10-CM

## 2016-05-14 LAB — URINE CULTURE

## 2016-05-14 MED ORDER — CYANOCOBALAMIN 1000 MCG/ML IJ SOLN
1000.0000 ug | Freq: Once | INTRAMUSCULAR | Status: AC
  Administered 2016-05-14: 1000 ug via INTRAMUSCULAR

## 2016-05-14 NOTE — Telephone Encounter (Signed)
Per Ward Givenshris Berge, NP, pt has no recent documentation of prolonged QT. She reports that had this dx on EKG in the ED in 2012 and again in 2015. She has only been on Celexa x 1 week. She will come over next week for nurse visit EKG for her reassurance.

## 2016-05-14 NOTE — Telephone Encounter (Signed)
Though celexa may cause qt prolongation, I don't see a documented history of prolonged QT.  She can come in next week for a repeat 12 lead to assess QT next week.

## 2016-05-14 NOTE — Telephone Encounter (Signed)
Pt wants to make sure that Celexa is safe to take w/ her diagnosis & pregnancy.

## 2016-05-14 NOTE — Progress Notes (Signed)
Pt is here for her 1st b-12 inj, states she is doing much better, is trying to stay positive is really trying hard

## 2016-05-14 NOTE — Telephone Encounter (Signed)
Patient was rx'd celexa 20 mg po daily for anxiety.  Patient says she has a hx of prolonged qt and wants to make sure this med is safe to take.  Please call patient asap as she is anxious and also stated that she is having palpitations but this is not new.

## 2016-05-18 ENCOUNTER — Ambulatory Visit: Payer: 59 | Admitting: Psychiatry

## 2016-05-18 ENCOUNTER — Telehealth: Payer: Self-pay | Admitting: Obstetrics and Gynecology

## 2016-05-18 NOTE — Telephone Encounter (Signed)
Called pt work note given and she will bring short term paperwork by here

## 2016-05-18 NOTE — Telephone Encounter (Signed)
Pt is requesting a call back, she needs to speak to you about some things, she stated that the physiatrist will not fill out the paper since she is not the one witting her out.

## 2016-05-21 ENCOUNTER — Ambulatory Visit (INDEPENDENT_AMBULATORY_CARE_PROVIDER_SITE_OTHER): Payer: 59

## 2016-05-21 VITALS — BP 110/66 | HR 92 | Ht 62.0 in | Wt 154.5 lb

## 2016-05-21 DIAGNOSIS — F419 Anxiety disorder, unspecified: Secondary | ICD-10-CM

## 2016-05-21 NOTE — Progress Notes (Addendum)
1.) Reason for visit: EKG  2.) Name of MD requesting visit: Dr. Mariah MillingGollan  3.) H&P: Pt has h/o significant anxiety.  She is [redacted] weeks pregnant.  Recently prescribed citalopram.  She is concerned that this will cause/exacerbate prolonged QT.  Pt reports this was seen on 3 previous EKGs, but we have no documentation of this.   4.) ROS related to problem: Pt overall feels well.  She reports that her HR jumps to the 150s when she walks even short distances, resolves quickly when she sits down.  OB has taken her out of work 2/2 anxiety, but she does not want to be out for her entire pregnancy.  EKG today shows NSR, rightward axis.  She reports that all of her recent EKGs show this.  ED doc told her previously this was due to her "skinny chest".     5.) Assessment and plan per MD: EKG placed on Dr. Windell HummingbirdGollan's desk for review.  Pt would appreciate a call back by the end of the day so that she does not worry over the weekend.   Spoke w/ pt  Advised her that her EKG did not show prolonged QT and it is ok for her to take Celexa.  She is appreciative and will call back w/ any other questions or concerns.

## 2016-05-24 ENCOUNTER — Telehealth: Payer: Self-pay | Admitting: Obstetrics and Gynecology

## 2016-05-24 NOTE — Telephone Encounter (Signed)
PT CALLED AND SHE STATED THAT SHE NEEDED ANOTHER NOTE ON HOW MUCH LONGER SHE WILL BE OUT OF WORK, SHE SAID THE OTHER ONE WAS ONLY GOOD TO A CERTAIN DAY, AND ALSO SHE WANTED TO KNOW IF THE STD PPW HAS BEEN SENT IN.

## 2016-05-26 ENCOUNTER — Ambulatory Visit: Payer: 59 | Admitting: Psychiatry

## 2016-05-26 ENCOUNTER — Ambulatory Visit (INDEPENDENT_AMBULATORY_CARE_PROVIDER_SITE_OTHER): Payer: 59 | Admitting: Obstetrics and Gynecology

## 2016-05-26 VITALS — BP 98/68 | HR 109 | Wt 158.8 lb

## 2016-05-26 DIAGNOSIS — Z3492 Encounter for supervision of normal pregnancy, unspecified, second trimester: Secondary | ICD-10-CM

## 2016-05-26 LAB — POCT URINALYSIS DIPSTICK
Bilirubin, UA: NEGATIVE
Blood, UA: NEGATIVE
GLUCOSE UA: NEGATIVE
KETONES UA: NEGATIVE
Leukocytes, UA: NEGATIVE
Nitrite, UA: NEGATIVE
SPEC GRAV UA: 1.01
Urobilinogen, UA: 0.2
pH, UA: 7

## 2016-05-26 MED ORDER — CITALOPRAM HYDROBROMIDE 10 MG PO TABS: 30.0000 mg | ORAL_TABLET | Freq: Every day | ORAL | 2 refills | Status: DC

## 2016-05-26 NOTE — Telephone Encounter (Signed)
Pt came in today for appt.

## 2016-05-26 NOTE — Progress Notes (Signed)
ROB

## 2016-05-26 NOTE — Progress Notes (Signed)
ROB- pt is doing some better, having some pain in her R jaw and under her ear, having a headache, almost feels like she has vertigo

## 2016-05-26 NOTE — Progress Notes (Signed)
ROB and follow up anxiety visit- states anxiety some better but desires increase in medication dose- increased to 30mg  daily, will reassess in 2 weeks. Also reports right ear pressure and dizziness- both ears with bulging tympanic membranes and clear fluid, sinuses boggy- recommend restarting claritin and saline nasal spray.  Discussed low BP and it's effects on dizziness. To increase water intake. Will continue leave for 2 more weeks- [facility]

## 2016-06-09 ENCOUNTER — Ambulatory Visit (INDEPENDENT_AMBULATORY_CARE_PROVIDER_SITE_OTHER): Payer: 59 | Admitting: Obstetrics and Gynecology

## 2016-06-09 VITALS — BP 106/66 | HR 101 | Wt 161.2 lb

## 2016-06-09 DIAGNOSIS — Z3492 Encounter for supervision of normal pregnancy, unspecified, second trimester: Secondary | ICD-10-CM

## 2016-06-09 LAB — POCT URINALYSIS DIPSTICK
Bilirubin, UA: NEGATIVE
Blood, UA: NEGATIVE
Glucose, UA: NEGATIVE
Ketones, UA: NEGATIVE
Leukocytes, UA: NEGATIVE
Nitrite, UA: NEGATIVE
Spec Grav, UA: 1.01
Urobilinogen, UA: 0.2
pH, UA: 7

## 2016-06-09 MED ORDER — CITALOPRAM HYDROBROMIDE 40 MG PO TABS: 40.0000 mg | ORAL_TABLET | Freq: Every day | ORAL | 4 refills | Status: DC

## 2016-06-09 NOTE — Progress Notes (Signed)
ROB- feels 30% but desires increasing celexa- OK to go up to 40mg , also still having breakthrough reflux and epigastric burning- OK to add TUMS.

## 2016-06-09 NOTE — Progress Notes (Signed)
ROB- pt is doing some better, states she still has "social anxiety", wants to increase her Celexa??

## 2016-06-15 ENCOUNTER — Telehealth: Payer: Self-pay | Admitting: Obstetrics and Gynecology

## 2016-06-15 NOTE — Telephone Encounter (Signed)
Pt is [redacted] wks PREGNANT, SHE IS EXTREMELY TIRED THIS WEEK AND THINKS HER IRON MAY BE LOW, HEART RATE IS UP MORE, SHE BEEN TRYING TO STAY CALM. SHE WOULD LIKE BLOOD WORK CHECKED AND SEE MELODY. SHE SAID SOMETHING IS JUST NOT RIGHT.

## 2016-06-16 NOTE — Telephone Encounter (Signed)
Called pt and advised she was going to get some labs @ her appt next week, pt voiced understanding

## 2016-06-17 ENCOUNTER — Encounter: Payer: Self-pay | Admitting: Physician Assistant

## 2016-06-17 ENCOUNTER — Ambulatory Visit (INDEPENDENT_AMBULATORY_CARE_PROVIDER_SITE_OTHER): Payer: 59 | Admitting: Physician Assistant

## 2016-06-17 VITALS — BP 100/58 | HR 105 | Ht 62.0 in | Wt 163.0 lb

## 2016-06-17 DIAGNOSIS — R002 Palpitations: Secondary | ICD-10-CM

## 2016-06-17 DIAGNOSIS — I341 Nonrheumatic mitral (valve) prolapse: Secondary | ICD-10-CM | POA: Diagnosis not present

## 2016-06-17 DIAGNOSIS — F419 Anxiety disorder, unspecified: Secondary | ICD-10-CM

## 2016-06-17 DIAGNOSIS — Z3492 Encounter for supervision of normal pregnancy, unspecified, second trimester: Secondary | ICD-10-CM | POA: Diagnosis not present

## 2016-06-17 DIAGNOSIS — F410 Panic disorder [episodic paroxysmal anxiety] without agoraphobia: Secondary | ICD-10-CM

## 2016-06-17 NOTE — Progress Notes (Signed)
Cardiology Office Note Date:  06/17/2016  Patient ID:  Katelyn Whitehead, DOB 1991-03-24, MRN [id number] PCP:  Greig RightSusan Fisher, PA-C  Cardiologist:  Dr. Mariah MillingGollan, MD    Chief Complaint: Palpitations  History of Present Illness: Katelyn SalinaKatie O Whitehead is a 25 y.o. female with history of ADHD not on stimulant, anxiety on Celexa, panic attacks on prn Klonopin, long history of palpitations, presyncope felt to be orthostasis in the setting of dehydration, and G2P0 currently [redacted] weeks pregnant who presents for evaluation of palpitations. She was most recently seen by Dr. Mariah MillingGollan on 04/21/2016 for follow up of her palpitations. She has a chronic baseline elevated heart rate and will periodically note significant tachy-palpitations with self reported heart rates reaching 160 bpm range. Husband has previously come with her to office visits and has reported the patient has significant anxiety. She has been concerned about her palpitations in the peri-delivery time frame. She was advised to increase PO fluid consumption and take Klonopin prn for anxiety and palpitations. It was preferred to hold on any BB or CCB given 2nd trimester pregnancy. She has previously worn a Holter monitor in 2012 that showed sinus tachycardia with rare PVCs. Seen in clinic in 09/2015 for palpitations. Wore a 48-hour Holter at that time that showed NSR with an average heart rate of 94 bpm, rare PACs with one run of 3 beats, single episode of sinus tachycardia with a rate of 156 bpm that did not correlate with any diary entry. None of her diary entries correleated with any significant arrhythmia. She has previously reported a history of MVP, though echo in 2014 and 2017 did not indicate this. Echo from 09/2012 showed mild MR and AI. Echo from 09/2015 showed trace MR, trace AI with normal LV systolic function.   Nurse visit on 05/21/16 after starting Celexa as she was concerned for possible prolonged QT (she reported she had history of this though there has  never been any evidence to show prolonged QT on prior EKGs in this patient). EKG on 05/21/16 non-acute without prolonged QT. She called OB on 11/7 stating she was tired and wanting blood work, planned for the following week.  She is very anxious today and self admits to this. As soon as I enter the exam room she blurts out "what does this mean" on her 12-lead in reference to possible RVH before I can greet her. She notes intermittent tachycardia with subjective rates in the 160 bpm range as well as episodes of marked bradycardia into the 30's bpm. These readings have been taken with her Apple watch, not with manual pulse. She has not felt any dizziness, presyncope, syncope, chest pain, or SOB. At rest her heart rate has been in the 90's bpm. She continues to not drink enough fluid and notes some positional orthostasis. Laying on her left lateral side when laying down. No recent long trips, swelling, erythema, or cording of the LEs. No issues with the pregnancy at this time. She does not want to take any added medications at this time.    Past Medical History:  Diagnosis Date  . ADHD (attention deficit hyperactivity disorder)   . Anxiety   . Depression   . Mild mitral and aortic regurgitation    a. 04/2013 Echo: EF 50-55%, mild AI, mild MR, no MVP.  Marland Kitchen. Palpitations    a. 2012 Holter: sinus tach, pvc's.  . Panic disorder     Past Surgical History:  Procedure Laterality Date  . WISDOM TOOTH EXTRACTION  Current Outpatient Prescriptions  Medication Sig Dispense Refill  . acetaminophen (TYLENOL) 500 MG tablet Take 500 mg by mouth every 6 (six) hours as needed.    . citalopram (CELEXA) 40 MG tablet Take 1 tablet (40 mg total) by mouth daily. 30 tablet 4  . clonazePAM (KLONOPIN) 0.5 MG tablet Take 1 tablet (0.5 mg total) by mouth 3 (three) times daily as needed for anxiety. 1/2 pill at night x 2 weeks 45 tablet 5  . cyanocobalamin (,VITAMIN B-12,) 1000 MCG/ML injection Inject 1 mL (1,000 mcg  total) into the muscle every 30 (thirty) days. 10 mL 1  . diphenhydrAMINE (BENADRYL) 25 mg capsule Take 25 mg by mouth at bedtime as needed for sleep.    . folic acid (FOLVITE) 1 MG tablet Take 1 tablet (1 mg total) by mouth daily. 90 tablet 4  . nitrofurantoin, macrocrystal-monohydrate, (MACROBID) 100 MG capsule Take 1 capsule (100 mg total) by mouth 2 (two) times daily. 14 capsule 1  . Vitamin D, Ergocalciferol, (DRISDOL) 50000 units CAPS capsule Take 1 capsule (50,000 Units total) by mouth every 7 (seven) days. 30 capsule 1   Current Facility-Administered Medications  Medication Dose Route Frequency Provider Last Rate Last Dose  . pantoprazole (PROTONIX) injection 40 mg  40 mg Intravenous Q24H Melody N Shambley, CNM        Allergies:   Influenza vaccines and Sulfa antibiotics   Social History:  The patient  reports that she has never smoked. She has never used smokeless tobacco. She reports that she does not drink alcohol or use drugs.   Family History:  The patient's family history includes ADD / ADHD in her brother; Dementia in her maternal grandmother; Depression in her maternal aunt, maternal grandmother, and mother; Drug abuse in her father.  ROS:   Review of Systems  Constitutional: Positive for malaise/fatigue. Negative for chills, diaphoresis, fever and weight loss.  HENT: Negative for congestion.   Eyes: Negative for discharge and redness.  Respiratory: Negative for cough, hemoptysis, sputum production, shortness of breath and wheezing.   Cardiovascular: Positive for palpitations. Negative for chest pain, orthopnea, claudication, leg swelling and PND.  Gastrointestinal: Negative for abdominal pain, blood in stool, heartburn and melena.  Genitourinary: Negative for hematuria.  Musculoskeletal: Negative for falls and myalgias.  Skin: Negative for rash.  Neurological: Negative for dizziness, tingling, tremors, sensory change, speech change, focal weakness, loss of consciousness  and weakness.  Endo/Heme/Allergies: Does not bruise/bleed easily.  Psychiatric/Behavioral: Negative for substance abuse. The patient is nervous/anxious.      PHYSICAL EXAM:  VS:  BP (!) 100/58 (BP Location: Left Arm, Patient Position: Sitting, Cuff Size: Normal)   Pulse (!) 105   Ht 5\' 2"  (1.575 m)   Wt 163 lb (73.9 kg)   LMP 12/09/2015 (LMP Unknown) Comment: day pt miscarriage  SpO2 98%   BMI 29.81 kg/m  BMI: Body mass index is 29.81 kg/m.  Physical Exam  Constitutional: She is oriented to person, place, and time. She appears well-developed and well-nourished.  HENT:  Head: Normocephalic and atraumatic.  Eyes: Right eye exhibits no discharge. Left eye exhibits no discharge.  Neck: Normal range of motion. No JVD present.  Cardiovascular: Normal rate, regular rhythm, S1 normal, S2 normal and normal heart sounds.  Exam reveals no distant heart sounds, no friction rub, no midsystolic click and no opening snap.   No murmur heard. Pulmonary/Chest: Effort normal and breath sounds normal. No respiratory distress. She has no decreased breath sounds. She has no  wheezes. She has no rales. She exhibits no tenderness.  Abdominal: Soft. She exhibits no distension. There is no tenderness.  Musculoskeletal: She exhibits no edema.  Neurological: She is alert and oriented to person, place, and time.  Skin: Skin is warm and dry. No cyanosis. Nails show no clubbing.  Psychiatric: She has a normal mood and affect. Her speech is normal and behavior is normal. Judgment and thought content normal.     EKG:  Was ordered and interpreted by me today. Shows NSR, 99 bpm, no acute st/t changes  Recent Labs: 09/11/2015: Magnesium 2.3 02/18/2016: Platelets 280 05/12/2016: ALT 10; BUN 9; Creatinine, Ser 0.56; Potassium 3.5; Sodium 139  No results found for requested labs within last 8760 hours.   CrCl cannot be calculated (Patient's most recent lab result is older than the maximum 21 days allowed.).   Wt  Readings from Last 3 Encounters:  06/17/16 163 lb (73.9 kg)  06/09/16 161 lb 3.2 oz (73.1 kg)  05/26/16 158 lb 12.8 oz (72 kg)     Other studies reviewed: Additional studies/records reviewed today include: summarized above  ASSESSMENT AND PLAN:  1. Palpitations with subjective bradycardic rates: Asymptomatic. Prior work up has never shown any significant arrhythmia. Worried about the readings she has been getting on her Apple watch. Advised the patient her watch is not likely completely accurate. Advised her to check a manual pulse. She knows how to do this. Given her self reported rates into the 30's bpm range I will repeat a 48-hour Holter to rule this out given her gravid state. In reference to her tachy-palpitations, previous cardiac monitoring has never shown significant arrhythmia. I would avoid initiating beta blocker, even acebutolol which is pregnancy category B, as well as avoid calcium channel blockers given risk to the developing fetus. She also does not want to start any of these medications. Just wants reassurance with diagnostics. I will check a TSH given her pregnancy, as the most recent level I can find was from 2014. I will also check a CBC to assess for physiologic anemia which can also play a role in her elevated heart rates. Check lytes as well. Increase fluids as I suspect there is some continued dehydration playing a role in the above still. Discussed with patient the normal physiological increase in heart rate, increased cardiac output, as well as decrease in SVR seen in pregnancy. At this time there is no data to suggest anything any different than a normal physiological response to her pregnancy.   2. Anxiety and panic disorder: Suspect this is her major issue in the above. Currently on Celexa (previously on Zoloft prior to pregnant state) as well as prn Klonopin. I discussed with patient new data that has come out that appears to indicate PAH in the child starting as young as  young adulthood. There is no data indicating PAH in the mother, only the child later in life. I recommend she discuss this with her OB/GYN and will defer management of this medication to them and the patient. Work on Product managercalming techniques.   3. 2nd trimester pregnancy: On PNV. Appears to be stable at this time. Per OB/GYN.    Disposition: F/u with Dr. Mariah MillingGollan in 6-12 months  Current medicines are reviewed at length with the patient today.  The patient did not have any concerns regarding medicines.  Elinor DodgeSigned, Khadir Roam PA-C 06/17/2016 3:58 PM     [facility] [place] [address] [place], [place] [address] [phone]

## 2016-06-17 NOTE — Patient Instructions (Addendum)
  Labwork: Labs done today CBC, CMET, and TSH.  Testing/Procedures: Your physician has recommended that you wear a holter monitor. Holter monitors are medical devices that record the heart's electrical activity. Doctors most often use these monitors to diagnose arrhythmias. Arrhythmias are problems with the speed or rhythm of the heartbeat. The monitor is a small, portable device. You can wear one while you do your normal daily activities. This is usually used to diagnose what is causing palpitations/syncope (passing out).    Follow-Up: Your physician wants you to follow-up in: 6 months with Dr. Mariah MillingGollan. You will receive a reminder letter in the mail two months in advance. If you don't receive a letter, please call our office to schedule the follow-up appointment.  It was a pleasure seeing you today here in the office. Please do not hesitate to give us a call back if you have any further questions. [phone]  Thomasville CellarPamela A. RN, BSN       Holter Monitoring A Holter monitor is a small device that is used to detect abnormal heart rhythms. It clips to your clothing and is connected by wires to flat, sticky disks (electrodes) that attach to your chest. It is worn continuously for 24-48 hours. HOME CARE INSTRUCTIONS  Wear your Holter monitor at all times, even while exercising and sleeping, for as long as directed by your health care provider.  Make sure that the Holter monitor is safely clipped to your clothing or close to your body as recommended by your health care provider.  Do not get the monitor or wires wet.  Do not put body lotion or moisturizer on your chest.  Keep your skin clean.  Keep a diary of your daily activities, such as walking and doing chores. If you feel that your heartbeat is abnormal or that your heart is fluttering or skipping a beat:  Record what you are doing when it happens.  Record what time of day the symptoms occur.  Return your Holter monitor as directed  by your health care provider.  Keep all follow-up visits as directed by your health care provider. This is important. SEEK IMMEDIATE MEDICAL CARE IF:  You feel lightheaded or you faint.  You have trouble breathing.  You feel pain in your chest, upper arm, or jaw.  You feel sick to your stomach and your skin is pale, cool, or damp.  You heartbeat feels unusual or abnormal.   This information is not intended to replace advice given to you by your health care provider. Make sure you discuss any questions you have with your health care provider.   Document Released: 04/23/2004 Document Revised: 08/16/2014 Document Reviewed: 03/04/2014 Elsevier Interactive Patient Education [employer]2016 Elsevier Inc.

## 2016-06-18 ENCOUNTER — Telehealth: Payer: Self-pay | Admitting: Cardiovascular Disease

## 2016-06-18 LAB — COMPREHENSIVE METABOLIC PANEL
A/G RATIO: 1.1 — AB (ref 1.2–2.2)
ALT: 14 IU/L (ref 0–32)
AST: 14 IU/L (ref 0–40)
Albumin: 3.2 g/dL — ABNORMAL LOW (ref 3.5–5.5)
Alkaline Phosphatase: 87 IU/L (ref 39–117)
BUN/Creatinine Ratio: 17 (ref 9–23)
BUN: 7 mg/dL (ref 6–20)
CHLORIDE: 102 mmol/L (ref 96–106)
CO2: 24 mmol/L (ref 18–29)
Calcium: 9.1 mg/dL (ref 8.7–10.2)
Creatinine, Ser: 0.42 mg/dL — ABNORMAL LOW (ref 0.57–1.00)
GFR calc non Af Amer: 141 mL/min/{1.73_m2} (ref >59)
GFR, EST AFRICAN AMERICAN: 162 mL/min/{1.73_m2} (ref >59)
Globulin, Total: 3 g/dL (ref 1.5–4.5)
Glucose: 87 mg/dL (ref 65–99)
POTASSIUM: 3.8 mmol/L (ref 3.5–5.2)
Sodium: 139 mmol/L (ref 134–144)
TOTAL PROTEIN: 6.2 g/dL (ref 6.0–8.5)

## 2016-06-18 LAB — CBC WITH DIFFERENTIAL/PLATELET
BASOS: 0 %
Basophils Absolute: 0 10*3/uL (ref 0.0–0.2)
EOS (ABSOLUTE): 0.3 10*3/uL (ref 0.0–0.4)
Eos: 3 %
Hematocrit: 30.5 % — ABNORMAL LOW (ref 34.0–46.6)
Hemoglobin: 10.7 g/dL — ABNORMAL LOW (ref 11.1–15.9)
Immature Grans (Abs): 0 10*3/uL (ref 0.0–0.1)
Immature Granulocytes: 1 %
Lymphocytes Absolute: 1.1 10*3/uL (ref 0.7–3.1)
Lymphs: 13 %
MCH: 27.9 pg (ref 26.6–33.0)
MCHC: 35.1 g/dL (ref 31.5–35.7)
MCV: 80 fL (ref 79–97)
MONOS ABS: 0.9 10*3/uL (ref 0.1–0.9)
Monocytes: 10 %
NEUTROS ABS: 6.4 10*3/uL (ref 1.4–7.0)
Neutrophils: 73 %
PLATELETS: 290 10*3/uL (ref 150–379)
RBC: 3.83 x10E6/uL (ref 3.77–5.28)
RDW: 13.5 % (ref 12.3–15.4)
WBC: 8.7 10*3/uL (ref 3.4–10.8)

## 2016-06-18 LAB — TSH: TSH: 1.29 u[IU]/mL (ref 0.450–4.500)

## 2016-06-18 NOTE — Telephone Encounter (Signed)
Pt would like lab results. Please call. 

## 2016-06-18 NOTE — Telephone Encounter (Signed)
Left voicemail message for her to call back.  

## 2016-06-18 NOTE — Telephone Encounter (Signed)
Patient called in to review lab results from yesterday. Let her know that Katelyn Listenyan Dunn PA has not reviewed them yet but I did review preliminary findings with her. She states that she wanted to know her Iron level. Let her know that test is not part of the labs which we drew. She verbalized understanding and had no further questions at this time.

## 2016-06-21 ENCOUNTER — Ambulatory Visit (INDEPENDENT_AMBULATORY_CARE_PROVIDER_SITE_OTHER): Payer: 59

## 2016-06-21 DIAGNOSIS — F419 Anxiety disorder, unspecified: Secondary | ICD-10-CM | POA: Diagnosis not present

## 2016-06-21 DIAGNOSIS — F410 Panic disorder [episodic paroxysmal anxiety] without agoraphobia: Secondary | ICD-10-CM

## 2016-06-21 DIAGNOSIS — Z3492 Encounter for supervision of normal pregnancy, unspecified, second trimester: Secondary | ICD-10-CM

## 2016-06-21 DIAGNOSIS — R002 Palpitations: Secondary | ICD-10-CM | POA: Diagnosis not present

## 2016-06-22 ENCOUNTER — Other Ambulatory Visit: Payer: Self-pay | Admitting: *Deleted

## 2016-06-22 DIAGNOSIS — Z131 Encounter for screening for diabetes mellitus: Secondary | ICD-10-CM

## 2016-06-23 ENCOUNTER — Ambulatory Visit (INDEPENDENT_AMBULATORY_CARE_PROVIDER_SITE_OTHER): Payer: 59 | Admitting: Obstetrics and Gynecology

## 2016-06-23 ENCOUNTER — Other Ambulatory Visit: Payer: 59

## 2016-06-23 VITALS — BP 112/68 | HR 98 | Wt 164.6 lb

## 2016-06-23 DIAGNOSIS — Z23 Encounter for immunization: Secondary | ICD-10-CM

## 2016-06-23 DIAGNOSIS — Z3493 Encounter for supervision of normal pregnancy, unspecified, third trimester: Secondary | ICD-10-CM | POA: Diagnosis not present

## 2016-06-23 DIAGNOSIS — E559 Vitamin D deficiency, unspecified: Secondary | ICD-10-CM

## 2016-06-23 DIAGNOSIS — E538 Deficiency of other specified B group vitamins: Secondary | ICD-10-CM

## 2016-06-23 DIAGNOSIS — Z131 Encounter for screening for diabetes mellitus: Secondary | ICD-10-CM | POA: Diagnosis not present

## 2016-06-23 LAB — POCT URINALYSIS DIPSTICK
BILIRUBIN UA: NEGATIVE
GLUCOSE UA: NEGATIVE
Ketones, UA: NEGATIVE
Leukocytes, UA: NEGATIVE
Nitrite, UA: NEGATIVE
Protein, UA: NEGATIVE
RBC UA: NEGATIVE
SPEC GRAV UA: 1.015
UROBILINOGEN UA: 0.2
pH, UA: 7

## 2016-06-23 MED ORDER — TETANUS-DIPHTH-ACELL PERTUSSIS 5-2.5-18.5 LF-MCG/0.5 IM SUSP
0.5000 mL | Freq: Once | INTRAMUSCULAR | Status: AC
  Administered 2016-06-23: 0.5 mL via INTRAMUSCULAR

## 2016-06-23 MED ORDER — FUSION PLUS PO CAPS: 1.0000 | ORAL_CAPSULE | Freq: Every day | ORAL | 1 refills | Status: DC

## 2016-06-23 NOTE — Progress Notes (Signed)
ROB-blood consent signed. Discussed cord blood banking vs donation-will consider, will continue medical leave due to severe anxiety. Discussed using a Doula for labor support.will repeat Vit D and B12 with labs. Fusion samples given

## 2016-06-23 NOTE — Progress Notes (Signed)
ROB- glucola done, blood consent signed, tdap given Pt had to wear a heart monitor, had a really bad anxiety attack

## 2016-06-24 ENCOUNTER — Encounter: Payer: Self-pay | Admitting: Obstetrics and Gynecology

## 2016-06-24 LAB — GLUCOSE, 1 HOUR GESTATIONAL: GESTATIONAL DIABETES SCREEN: 116 mg/dL (ref 65–139)

## 2016-06-24 LAB — HEMOGLOBIN AND HEMATOCRIT, BLOOD
HEMATOCRIT: 32 % — AB (ref 34.0–46.6)
Hemoglobin: 11.1 g/dL (ref 11.1–15.9)

## 2016-06-24 LAB — B12 AND FOLATE PANEL
Folate: 14.5 ng/mL (ref >3.0)
Vitamin B-12: >2000 pg/mL — ABNORMAL HIGH (ref 211–946)

## 2016-06-24 LAB — VITAMIN D 25 HYDROXY (VIT D DEFICIENCY, FRACTURES): Vit D, 25-Hydroxy: 29.4 ng/mL — ABNORMAL LOW (ref 30.0–100.0)

## 2016-06-28 ENCOUNTER — Telehealth: Payer: Self-pay | Admitting: Obstetrics and Gynecology

## 2016-06-28 ENCOUNTER — Telehealth: Payer: Self-pay | Admitting: Cardiovascular Disease

## 2016-06-28 NOTE — Telephone Encounter (Signed)
Short term disability is looking for the updated papers saying she is out continuously,

## 2016-06-28 NOTE — Telephone Encounter (Signed)
Monitor not yet resulted/read. No strips in epic for preliminary review.

## 2016-06-28 NOTE — Telephone Encounter (Signed)
Please call patient with heart monitor results. Thanks 

## 2016-06-29 NOTE — Telephone Encounter (Signed)
Faxed all paperwork to [facility]

## 2016-06-30 DIAGNOSIS — R002 Palpitations: Secondary | ICD-10-CM | POA: Diagnosis not present

## 2016-06-30 NOTE — Telephone Encounter (Signed)
Spoke with patient that the Zio monitor is in the process of being scanned.  Told her someone will call her as soon as we receive it and the docotor has read it.

## 2016-07-06 ENCOUNTER — Telehealth: Payer: Self-pay | Admitting: Cardiovascular Disease

## 2016-07-06 NOTE — Telephone Encounter (Signed)
Pt would like holter monitor results. Please call. 

## 2016-07-06 NOTE — Telephone Encounter (Signed)
Monitor reviewed. Predominant rhythm of sinus. Average heart rate of 99 bpm (within normal limits given pregnancy) max heart rate of 172, min heart rate of 75. Some triggers were associated with isolated supraventricular ectopy (isolated beats) for a total of < 1% of total beats. Diary entries did not correlate with any significant arrhythmia. Given the rarity of the above events coupled with patient's pregnancy status would continue to not recommend medical therapy given risks to the developing fetus. Work on Product managercalming techniques.

## 2016-07-06 NOTE — Telephone Encounter (Signed)
Reviewed results w/ pt.  Advised of Ryan's recommendation. Pt verbalizes understanding and will call back w/ any questions or concerns.

## 2016-07-14 ENCOUNTER — Ambulatory Visit (INDEPENDENT_AMBULATORY_CARE_PROVIDER_SITE_OTHER): Payer: 59 | Admitting: Obstetrics and Gynecology

## 2016-07-14 ENCOUNTER — Encounter: Payer: Self-pay | Admitting: Obstetrics and Gynecology

## 2016-07-14 VITALS — BP 101/64 | HR 124 | Wt 170.0 lb

## 2016-07-14 DIAGNOSIS — Z3493 Encounter for supervision of normal pregnancy, unspecified, third trimester: Secondary | ICD-10-CM

## 2016-07-14 LAB — POCT URINALYSIS DIPSTICK
Bilirubin, UA: NEGATIVE
Blood, UA: NEGATIVE
GLUCOSE UA: NEGATIVE
Ketones, UA: NEGATIVE
LEUKOCYTES UA: NEGATIVE
NITRITE UA: NEGATIVE
Protein, UA: NEGATIVE
Spec Grav, UA: 1.01
UROBILINOGEN UA: 0.2
pH, UA: 6.5

## 2016-07-14 NOTE — Progress Notes (Signed)
ROB- pt is doing well, seems to be handling things well, she is very anxious to see her baby

## 2016-07-14 NOTE — Progress Notes (Signed)
ROB- Overall doing well. Continues to have dizzy spells and states her "head never feels right." Also reports sore throat that started several weeks ago but resolved and has returned 3 days ago and cough has been persistent for most of her pregnancy. VSS, lung sounds clear bilaterally, reassured and discussed trying OTC medications safe for pregnancy along with a humidifier at night. Patient verbalized understanding.

## 2016-07-27 ENCOUNTER — Ambulatory Visit (INDEPENDENT_AMBULATORY_CARE_PROVIDER_SITE_OTHER): Payer: 59 | Admitting: Obstetrics and Gynecology

## 2016-07-27 VITALS — BP 116/77 | HR 115 | Wt 170.7 lb

## 2016-07-27 DIAGNOSIS — Z3493 Encounter for supervision of normal pregnancy, unspecified, third trimester: Secondary | ICD-10-CM

## 2016-07-27 LAB — POCT URINALYSIS DIPSTICK
Bilirubin, UA: NEGATIVE
Glucose, UA: NEGATIVE
Ketones, UA: NEGATIVE
Leukocytes, UA: NEGATIVE
NITRITE UA: NEGATIVE
PH UA: 6.5
PROTEIN UA: NEGATIVE
RBC UA: NEGATIVE
SPEC GRAV UA: 1.01
UROBILINOGEN UA: 0.2

## 2016-07-27 MED ORDER — CLONAZEPAM 0.5 MG PO TABS: 0.5000 mg | ORAL_TABLET | Freq: Three times a day (TID) | ORAL | 5 refills | Status: DC | PRN

## 2016-07-27 NOTE — Patient Instructions (Signed)
Carpal Tunnel Syndrome Carpal tunnel syndrome is a condition that causes pain in your hand and arm. The carpal tunnel is a narrow area located on the palm side of your wrist. Repeated wrist motion or certain diseases may cause swelling within the tunnel. This swelling pinches the main nerve in the wrist (median nerve). What are the causes? This condition may be caused by:  Repeated wrist motions.  Wrist injuries.  Arthritis.  A cyst or tumor in the carpal tunnel.  Fluid buildup during pregnancy.  Sometimes the cause of this condition is not known. What increases the risk? This condition is more likely to develop in:  People who have jobs that cause them to repeatedly move their wrists in the same motion, such as butchers and cashiers.  Women.  People with certain conditions, such as: ? Diabetes. ? Obesity. ? An underactive thyroid (hypothyroidism). ? Kidney failure.  What are the signs or symptoms? Symptoms of this condition include:  A tingling feeling in your fingers, especially in your thumb, index, and middle fingers.  Tingling or numbness in your hand.  An aching feeling in your entire arm, especially when your wrist and elbow are bent for long periods of time.  Wrist pain that goes up your arm to your shoulder.  Pain that goes down into your palm or fingers.  A weak feeling in your hands. You may have trouble grabbing and holding items.  Your symptoms may feel worse during the night. How is this diagnosed? This condition is diagnosed with a medical history and physical exam. You may also have tests, including:  An electromyogram (EMG). This test measures electrical signals sent by your nerves into the muscles.  X-rays.  How is this treated? Treatment for this condition includes:  Lifestyle changes. It is important to stop doing or modify the activity that caused your condition.  Physical or occupational therapy.  Medicines for pain and inflammation.  This may include medicine that is injected into your wrist.  A wrist splint.  Surgery.  Follow these instructions at home: If you have a splint:  Wear it as told by your health care provider. Remove it only as told by your health care provider.  Loosen the splint if your fingers become numb and tingle, or if they turn cold and blue.  Keep the splint clean and dry. General instructions  Take over-the-counter and prescription medicines only as told by your health care provider.  Rest your wrist from any activity that may be causing your pain. If your condition is work related, talk to your employer about changes that can be made, such as getting a wrist pad to use while typing.  If directed, apply ice to the painful area: ? Put ice in a plastic bag. ? Place a towel between your skin and the bag. ? Leave the ice on for 20 minutes, 2-3 times per day.  Keep all follow-up visits as told by your health care provider. This is important.  Do any exercises as told by your health care provider, physical therapist, or occupational therapist. Contact a health care provider if:  You have new symptoms.  Your pain is not controlled with medicines.  Your symptoms get worse. This information is not intended to replace advice given to you by your health care provider. Make sure you discuss any questions you have with your health care provider. Document Released: 07/23/2000 Document Revised: 12/04/2015 Document Reviewed: 12/11/2014 Elsevier Interactive Patient Education  2017 Elsevier Inc.  

## 2016-07-27 NOTE — Progress Notes (Signed)
ROB-doing well, discussed carpal tunnel symptoms

## 2016-07-27 NOTE — Progress Notes (Signed)
ROB- pt is "crying afraid she is going to die", she states she stays in the bed all the day, I advised her to enjoy the outside and get out of the bed

## 2016-07-30 ENCOUNTER — Observation Stay
Admission: EM | Admit: 2016-07-30 | Discharge: 2016-07-30 | Disposition: A | Discharge status: Home / Self Care | Payer: 59 | Attending: Obstetrics and Gynecology | Admitting: Obstetrics and Gynecology

## 2016-07-30 ENCOUNTER — Ambulatory Visit: Payer: Self-pay | Admitting: Physician Assistant

## 2016-07-30 ENCOUNTER — Observation Stay: Payer: 59

## 2016-07-30 VITALS — BP 100/70 | HR 141 | Temp 98.4°F

## 2016-07-30 DIAGNOSIS — F329 Major depressive disorder, single episode, unspecified: Secondary | ICD-10-CM | POA: Insufficient documentation

## 2016-07-30 DIAGNOSIS — O26899 Other specified pregnancy related conditions, unspecified trimester: Secondary | ICD-10-CM

## 2016-07-30 DIAGNOSIS — R51 Headache: Secondary | ICD-10-CM | POA: Insufficient documentation

## 2016-07-30 DIAGNOSIS — O9989 Other specified diseases and conditions complicating pregnancy, childbirth and the puerperium: Secondary | ICD-10-CM

## 2016-07-30 DIAGNOSIS — F909 Attention-deficit hyperactivity disorder, unspecified type: Secondary | ICD-10-CM | POA: Insufficient documentation

## 2016-07-30 DIAGNOSIS — Z887 Allergy status to serum and vaccine status: Secondary | ICD-10-CM | POA: Insufficient documentation

## 2016-07-30 DIAGNOSIS — O09899 Supervision of other high risk pregnancies, unspecified trimester: Secondary | ICD-10-CM

## 2016-07-30 DIAGNOSIS — O99343 Other mental disorders complicating pregnancy, third trimester: Secondary | ICD-10-CM | POA: Diagnosis not present

## 2016-07-30 DIAGNOSIS — O99413 Diseases of the circulatory system complicating pregnancy, third trimester: Secondary | ICD-10-CM | POA: Insufficient documentation

## 2016-07-30 DIAGNOSIS — Z882 Allergy status to sulfonamides status: Secondary | ICD-10-CM | POA: Diagnosis not present

## 2016-07-30 DIAGNOSIS — I351 Nonrheumatic aortic (valve) insufficiency: Secondary | ICD-10-CM | POA: Diagnosis not present

## 2016-07-30 DIAGNOSIS — Z3A33 33 weeks gestation of pregnancy: Secondary | ICD-10-CM | POA: Diagnosis not present

## 2016-07-30 DIAGNOSIS — R000 Tachycardia, unspecified: Secondary | ICD-10-CM | POA: Diagnosis not present

## 2016-07-30 DIAGNOSIS — G4452 New daily persistent headache (NDPH): Secondary | ICD-10-CM | POA: Diagnosis not present

## 2016-07-30 DIAGNOSIS — R102 Pelvic and perineal pain: Secondary | ICD-10-CM | POA: Diagnosis not present

## 2016-07-30 DIAGNOSIS — J020 Streptococcal pharyngitis: Secondary | ICD-10-CM

## 2016-07-30 DIAGNOSIS — F419 Anxiety disorder, unspecified: Secondary | ICD-10-CM | POA: Insufficient documentation

## 2016-07-30 DIAGNOSIS — O368130 Decreased fetal movements, third trimester, not applicable or unspecified: Secondary | ICD-10-CM | POA: Diagnosis not present

## 2016-07-30 DIAGNOSIS — Z283 Underimmunization status: Secondary | ICD-10-CM

## 2016-07-30 DIAGNOSIS — I341 Nonrheumatic mitral (valve) prolapse: Secondary | ICD-10-CM | POA: Diagnosis not present

## 2016-07-30 DIAGNOSIS — E559 Vitamin D deficiency, unspecified: Secondary | ICD-10-CM | POA: Insufficient documentation

## 2016-07-30 DIAGNOSIS — Z2839 Other underimmunization status: Secondary | ICD-10-CM

## 2016-07-30 DIAGNOSIS — O26893 Other specified pregnancy related conditions, third trimester: Secondary | ICD-10-CM | POA: Diagnosis not present

## 2016-07-30 LAB — URINALYSIS, ROUTINE W REFLEX MICROSCOPIC
Bilirubin Urine: NEGATIVE
GLUCOSE, UA: NEGATIVE mg/dL
HGB URINE DIPSTICK: NEGATIVE
KETONES UR: NEGATIVE mg/dL
NITRITE: NEGATIVE
PROTEIN: 30 mg/dL — AB
Specific Gravity, Urine: 1.017 (ref 1.005–1.030)
pH: 6 (ref 5.0–8.0)

## 2016-07-30 LAB — POCT RAPID STREP A (OFFICE): Rapid Strep A Screen: NEGATIVE

## 2016-07-30 MED ORDER — OXYCODONE-ACETAMINOPHEN 5-325 MG PO TABS
1.0000 | ORAL_TABLET | Freq: Once | ORAL | Status: AC
  Administered 2016-07-30: 1 via ORAL
  Filled 2016-07-30: qty 1

## 2016-07-30 MED ORDER — LORAZEPAM 1 MG PO TABS
2.0000 mg | ORAL_TABLET | Freq: Once | ORAL | Status: AC
  Administered 2016-07-30: 2 mg via ORAL
  Filled 2016-07-30: qty 2
  Filled 2016-07-30: qty 1

## 2016-07-30 MED ORDER — LORAZEPAM 1 MG PO TABS
2.0000 mg | ORAL_TABLET | ORAL | Status: DC | PRN
  Administered 2016-07-30: 2 mg via ORAL
  Filled 2016-07-30: qty 2

## 2016-07-30 NOTE — OB Triage Note (Signed)
Pt also reports extreme anxiety, headache, and dizziness as well. Pt has a hx of headache and anxiety, however they have increased during pregnancy. The dizziness is a new symptom.

## 2016-07-30 NOTE — Final Progress Note (Signed)
L&D OB Triage Note  HPI:  Katelyn Whitehead is a 25 y.o. G2P0010 female at [redacted]w[redacted]d. Estimated Date of Delivery: 09/14/16 who presents for complaints of pelvic pressure.  [facility] was [facility] earlier to day for complaints of upper respiratory symptoms.  Due to complaints of pelvic pressure and tachycardia (HR 114), patient was redirected to Labor and Delivery.  Patient reported an episode of diarrhea this morning, and has had some nausea and vomiting last week due to a stomach virus.  Was noting decrease fetal movement earlier today.   In addition, patient complains of moderate persistent headache over the past 4 months.  Notes that she has tried several different medications, but the headache will not go away.  Headache has gotten progressively worse over time.   Notes that it hurts to the point of not wanting to move because she feels as though her head is going to explode.  Patient reports that she is very concerned that something may be going on, such as a brain tumor.  Reports dizziness and throbbing sensation in the back of her head.  Patient has been so consumed with this that she has not been able to work in the past several months.  Patient is also fearful of developing a blood clot as she finds it hard to even get out of bed every day due to her headache and fear of worsening "whatever is going on in her head". Denies h/o headaches prior to pregnancy.   Lastly, patient with a significant history of depression and anxiety (with panic disorder), is currently on Clonopin and Celexa.  Patient notes that the Clonopin is not helping with her anxiety.  Notes that she is fearful to leave the house due to concerns of "something happening to her".  Is only taking 1 tablet per day.    OB History  Gravida Para Term Preterm AB Living  2       1    SAB TAB Ectopic Multiple Live Births  1            # Outcome Date GA Lbr Len/2nd Weight Sex Delivery Anes PTL Lv  2 Current           1 SAB 12/09/15  [redacted]w[redacted]d      N FD      Patient Active Problem List   Diagnosis Date Noted  . Labor and delivery, indication for care 07/30/2016  . New daily persistent headache 07/30/2016  . Vitamin D deficiency 05/13/2016  . B12 deficiency 05/13/2016  . Indication for care in labor and delivery, antepartum 05/01/2016  . Rubella non-immune status, antepartum 02/25/2016  . Maternal varicella, non-immune 02/25/2016  . Mild mitral and aortic regurgitation   . Panic disorder   . ADD (attention deficit disorder) 12/04/2014  . Depression, major, recurrent, moderate (HCC) 12/04/2014  . Episodic paroxysmal anxiety disorder 12/04/2014  . Aortic insufficiency 04/05/2013  . Mitral valve prolapse 02/24/2011    Past Medical History:  Diagnosis Date  . ADHD (attention deficit hyperactivity disorder)   . Anxiety   . Depression   . Mild mitral and aortic regurgitation    a. 04/2013 Echo: EF 50-55%, mild AI, mild MR, no MVP.  Marland Kitchen Palpitations    a. 2012 Holter: sinus tach, pvc's.  . Panic disorder     No current facility-administered medications on file prior to encounter.    Current Outpatient Prescriptions on File Prior to Encounter  Medication Sig Dispense Refill  . acetaminophen (TYLENOL)  500 MG tablet Take 500 mg by mouth every 6 (six) hours as needed.    . citalopram (CELEXA) 40 MG tablet Take 1 tablet (40 mg total) by mouth daily. 30 tablet 4  . clonazePAM (KLONOPIN) 0.5 MG tablet Take 1 tablet (0.5 mg total) by mouth 3 (three) times daily as needed for anxiety. 1/2 pill at night x 2 weeks 45 tablet 5  . folic acid (FOLVITE) 1 MG tablet Take 1 tablet (1 mg total) by mouth daily. 90 tablet 4  . Iron-FA-B Cmp-C-Biot-Probiotic (FUSION PLUS) CAPS Take 1 capsule by mouth daily. 60 capsule 1  . Vitamin D, Ergocalciferol, (DRISDOL) 50000 units CAPS capsule Take 1 capsule (50,000 Units total) by mouth every 7 (seven) days. 30 capsule 1  . nitrofurantoin, macrocrystal-monohydrate, (MACROBID) 100 MG capsule Take 1  capsule (100 mg total) by mouth 2 (two) times daily. (Patient not taking: Reported on 07/30/2016) 14 capsule 1    Allergies  Allergen Reactions  . Influenza Vaccines Hives  . Sulfa Antibiotics     Hives Rash     ROS:  Review of Systems - Negative except what's noted in HPI   Physical Exam:  Blood pressure 116/66, pulse (!) 110, temperature 98.5 F (36.9 C), temperature source Oral, resp. rate 18, last menstrual period 12/09/2015, SpO2 98 %. General appearance: alert and cooperative Abdomen: soft, non-tender; bowel sounds normal; no masses,  no organomegaly. Gravid.  Pelvic: deferred Extremities: extremities normal, atraumatic, no cyanosis or edema  Psychologic:  Very anxious, jittery, unable to sit still, agitated.    NST INTERPRETATION: Indications: decreased fetal movement and rule out uterine contractions  Mode: External Baseline Rate (A): 135 bpm Variability: Moderate Accelerations: 15 x 15 Decelerations: None     Contraction Frequency (min): occasional  Impression: reactive    Labs:  Results for orders placed or performed during the hospital encounter of 07/30/16  Urinalysis, Routine w reflex microscopic  Result Value Ref Range   Color, Urine YELLOW (A) YELLOW   APPearance CLEAR (A) CLEAR   Specific Gravity, Urine 1.017 1.005 - 1.030   pH 6.0 5.0 - 8.0   Glucose, UA NEGATIVE NEGATIVE mg/dL   Hgb urine dipstick NEGATIVE NEGATIVE   Bilirubin Urine NEGATIVE NEGATIVE   Ketones, ur NEGATIVE NEGATIVE mg/dL   Protein, ur 30 (A) NEGATIVE mg/dL   Nitrite NEGATIVE NEGATIVE   Leukocytes, UA SMALL (A) NEGATIVE   RBC / HPF 0-5 0 - 5 RBC/hpf   WBC, UA 0-5 0 - 5 WBC/hpf   Bacteria, UA RARE (A) NONE SEEN   Squamous Epithelial / LPF 6-30 (A) NONE SEEN   Mucous PRESENT    Ca Oxalate Crys, UA PRESENT     Assessment:  25 y.o. G2P0010 at 3037w3d with:  1.  Severe persistent headache 2. Tachycardia 3. Anxiety 4. URI symptoms 5. Decreased fetal movement    Plan:   1. Severe persistent headache - patient notes a lot of pressure at occipital region of head, with headache worsening over past 4 months.  Has tried OTC meds.  Given 2 Percocet today, headache still unrelieved.  Patient requests MRI as she is deathly afraid that "something is going on in her head and that she fears it may kill her". Advised that MRI is not the firs step for evaluation, and that a CT would be more beneficial.  Discussed risks vs benefits of CT imaging in pregnancy.  Patient notes understanding, desires to proceed. CT scan ordered.    2. Tachycardia, likely secondary  to mild dehydration and/or anxiety, UA negative for ketones, small amount of protein.  Encouraged aggressive PO hydration.   3. Anxiety, not controlled with current medication.   Patient does note that Celexa has worked well for her depression and was helping her anxiety until the past few months. Patient reports that even at full dose of Clonopin she was not noting the relief of her anxiety.  Given Ativan 1 mg x 2 doses for patient's severe agitation with improvement in symptoms.  Patient desired prescriptoin for ativan, but discussed it's limited use in pregnancy if possible.  Will change from Clonopin to Buspar (patient will not have to worry about weaning during third trimester, as she is having to do with the Clonopin).  I also strongly advised patient that she needs to f/u with a therapist.    4. URI symptoms x 4-5 days.  Patient notes that she has been trying to use OTC meds without relief.  Will prescribe Amoxicillin (was initially going to prescribe Zpak, but this interferes with several of patient's medications).   5. Decreased fetal movement noted by patient earlier today, has improved while in triage.  Fetus with active movement detected.   Katelyn LaserAnika Cniyah Sproull, MD [facility]   Addendum  12/22  2200 Patient's CT scan results still not available, patient notes that she is ready to go home as she has been in  triage for several hours.  Called radiologist several times for reading. Will call patient with results in the a.m.

## 2016-07-30 NOTE — OB Triage Note (Signed)
Pt presents complaining of increased pressure, Vomiting X 4, Diarrhea X 2. Denies any bleeding or LOF. Reports positive fetal movement.

## 2016-07-30 NOTE — Progress Notes (Signed)
S: c/o sore throat for 2 weeks, was told its viral, now she feels really bad, having lower pelvic pain with a lot of pressure, had an episode of diarrhea this morning, her husband had stomach virus last week, is [redacted] weeks pregnant, baby feels higher up and is not moving like normal even though she just felt her kick  O: vitals w tachycardia, 114, fever blisters on lips, face appears swollen as is normal in late pregnancy, tms clear, throat red and swollen, neck supple no lymph, lungs c t a, cv rapid rate with reg rhythm, abd is protruded with pregnancy, q strep neg  A: tachycardia, ?dehydration, pelvic pain in 33 week preg patient, sore throat  P: sent to ER via courtesy car accompanied by RMA, called charge nurse to notify

## 2016-07-30 NOTE — Progress Notes (Signed)
Patient expressing overwhelming anxiety, states "I feel like I am gong to die", reports headache unrelieved by tylenol, reports tremors when she wakes up at home, denies any seizures, reports "I can't do anything at home, I get up and go from bed to couch" Pt reports not feeling like doing anything that normally brings her happiness, says "I find reasons to not go out of the house, because I am afraid I am going to die". Pt husband verifies pt statements.

## 2016-07-31 ENCOUNTER — Telehealth: Payer: Self-pay | Admitting: Obstetrics and Gynecology

## 2016-07-31 ENCOUNTER — Other Ambulatory Visit: Payer: Self-pay | Admitting: Obstetrics and Gynecology

## 2016-07-31 MED ORDER — BUSPIRONE HCL 7.5 MG PO TABS: 7.5000 mg | ORAL_TABLET | Freq: Three times a day (TID) | ORAL | 2 refills | Status: DC

## 2016-07-31 MED ORDER — AZITHROMYCIN 250 MG PO TABS: ORAL_TABLET | ORAL | 0 refills | Status: DC

## 2016-07-31 MED ORDER — AMOXICILLIN 500 MG PO CAPS: 500.0000 mg | ORAL_CAPSULE | Freq: Three times a day (TID) | ORAL | 0 refills | Status: DC

## 2016-07-31 NOTE — Telephone Encounter (Signed)
Contacted patient to inform of CT scan results from yesterday.  Patient's husband notes that patient just left the house but will contact her on her cell phone regarding results. Informed husband that CT scan was negative for brain mass.  Husband also reports that patient has noted decreased fetal movement since this morning.  States that they just ate breakfast.  Advised him to inform patient to drink something cold and sugary, and if still no movement in the next 30 minutes, to return to L&D to reassess fetus.   Also informed him that patient's prescription (Buspar and Amoxicillin) was called into their regular pharmacy [facility]), however this is closed on the weekends.  He instructed to prescribe to [facility] [place]     Hildred LaserAnika Krystopher Kuenzel, MD [facility]

## 2016-08-05 ENCOUNTER — Encounter: Payer: 59 | Admitting: Obstetrics and Gynecology

## 2016-08-06 ENCOUNTER — Other Ambulatory Visit: Payer: Self-pay | Admitting: Psychiatry

## 2016-08-09 NOTE — L&D Delivery Note (Signed)
Delivery Note At  1451 a viable and healthy female was delivered via  (Presentation:OA ;  ).  APGAR: 6, 8; weight 8#6oz  .   Placenta status: manually extracted intact with 3 vessel  Cord:  with the following complications:low grade maternal fever, Parkin x2 reduced on perineum, MSAF, low vacuum extraction with on pop off.  Anesthesia:  epidural Episiotomy:  none Lacerations:  left labial laceration up toward clitorus but not into it. Suture Repair: 3.0 chromic Est. Blood Loss (mL):  500  Mom to postpartum.  Baby to Couplet care / Skin to Skin.  Robbi Scurlock N Alyce Inscore 09/10/2016, 3:19 PM

## 2016-08-10 ENCOUNTER — Ambulatory Visit (INDEPENDENT_AMBULATORY_CARE_PROVIDER_SITE_OTHER): Payer: 59 | Admitting: Certified Nurse Midwife

## 2016-08-10 VITALS — BP 100/62 | HR 119 | Wt 171.5 lb

## 2016-08-10 DIAGNOSIS — Z3493 Encounter for supervision of normal pregnancy, unspecified, third trimester: Secondary | ICD-10-CM | POA: Diagnosis not present

## 2016-08-10 LAB — POCT URINALYSIS DIPSTICK
GLUCOSE UA: NEGATIVE
Protein, UA: NEGATIVE
Spec Grav, UA: 1.015
pH, UA: 5

## 2016-08-10 NOTE — Progress Notes (Signed)
ROB: Pt is doing well pregnancy wise. Reports rib pain, lower abdominal pain, and vaginal pressure and discharge. Good fetal movement. Reiterated home measures and medications to treat the common discomforts of pregnancy.   Pt reports taking her BuSpar and Celexa daily with no change in her level of anxiety. Stopped taking Klonopin on Sunday and has experience n/v/d. Advised pt to continue taking her Buspar and increase her dose per Rx instructions.   Spoke with Dr Valentino Saxonherry and pt urgently referred to psychiatry for continued management of anxiety during pregnancy.   Cultures and GBS collected. Lab work per orders.

## 2016-08-10 NOTE — Progress Notes (Signed)
Pt states she doesn't feel right since stopping Klonopin. C/o not sleeping, swelling in feet and hands, right rib pain, lower abdominal pain, brown/clear discharge and contractions. Also states fetal movement has decreased.

## 2016-08-10 NOTE — Addendum Note (Signed)
Addended by: Brooke DareSICK, Rayne Cowdrey L on: 08/10/2016 04:16 PM   Modules accepted: Orders

## 2016-08-11 ENCOUNTER — Telehealth: Payer: Self-pay | Admitting: Obstetrics and Gynecology

## 2016-08-11 LAB — HEMOGLOBIN AND HEMATOCRIT, BLOOD
Hematocrit: 34 % (ref 34.0–46.6)
Hemoglobin: 11.3 g/dL (ref 11.1–15.9)

## 2016-08-11 LAB — COMPREHENSIVE METABOLIC PANEL
ALK PHOS: 158 IU/L — AB (ref 39–117)
ALT: 15 IU/L (ref 0–32)
AST: 18 IU/L (ref 0–40)
Albumin/Globulin Ratio: 1 — ABNORMAL LOW (ref 1.2–2.2)
Albumin: 3.1 g/dL — ABNORMAL LOW (ref 3.5–5.5)
BILIRUBIN TOTAL: 0.2 mg/dL (ref 0.0–1.2)
BUN / CREAT RATIO: 11 (ref 9–23)
BUN: 6 mg/dL (ref 6–20)
CHLORIDE: 100 mmol/L (ref 96–106)
CO2: 22 mmol/L (ref 18–29)
Calcium: 9.3 mg/dL (ref 8.7–10.2)
Creatinine, Ser: 0.56 mg/dL — ABNORMAL LOW (ref 0.57–1.00)
GFR calc Af Amer: 148 mL/min/{1.73_m2} (ref >59)
GFR calc non Af Amer: 128 mL/min/{1.73_m2} (ref >59)
GLOBULIN, TOTAL: 3.1 g/dL (ref 1.5–4.5)
GLUCOSE: 115 mg/dL — AB (ref 65–99)
POTASSIUM: 3.7 mmol/L (ref 3.5–5.2)
SODIUM: 136 mmol/L (ref 134–144)
Total Protein: 6.2 g/dL (ref 6.0–8.5)

## 2016-08-11 LAB — VITAMIN B12: VITAMIN B 12: 248 pg/mL (ref 232–1245)

## 2016-08-11 LAB — VITAMIN D 25 HYDROXY (VIT D DEFICIENCY, FRACTURES): VIT D 25 HYDROXY: 48.5 ng/mL (ref 30.0–100.0)

## 2016-08-11 LAB — HEPATITIS B SURFACE ANTIGEN: Hepatitis B Surface Ag: NEGATIVE

## 2016-08-11 NOTE — Telephone Encounter (Signed)
Please let her know Marcelino DusterMichelle hasn't reviewed them yet and when she does they will show up on mychart, but what I glanced at looks OK, not all back yet

## 2016-08-11 NOTE — Telephone Encounter (Signed)
LAB RESULTS

## 2016-08-11 NOTE — Telephone Encounter (Signed)
pls advise

## 2016-08-12 ENCOUNTER — Telehealth: Payer: Self-pay | Admitting: Obstetrics and Gynecology

## 2016-08-12 NOTE — Telephone Encounter (Signed)
Katelyn Whitehead wants something sent in b/c she can't sleep or eat and feels bad and cant get an appt till next Friday w/ pysch dr

## 2016-08-13 LAB — STREP GP B NAA: Strep Gp B NAA: POSITIVE — AB

## 2016-08-17 ENCOUNTER — Telehealth: Payer: Self-pay

## 2016-08-17 NOTE — Telephone Encounter (Signed)
pt called states that she needs something . she is not sleeping and having anxiety . obgy will not give her anything said that she needed to see her psychiatry.  pt states she takes celexa and buspar but she states she needs something to help her

## 2016-08-18 ENCOUNTER — Encounter: Payer: Self-pay | Admitting: Psychiatry

## 2016-08-18 ENCOUNTER — Ambulatory Visit (INDEPENDENT_AMBULATORY_CARE_PROVIDER_SITE_OTHER): Payer: 59 | Admitting: Psychiatry

## 2016-08-18 VITALS — BP 103/61 | HR 144 | Temp 98.5°F | Wt 170.4 lb

## 2016-08-18 DIAGNOSIS — F410 Panic disorder [episodic paroxysmal anxiety] without agoraphobia: Secondary | ICD-10-CM

## 2016-08-18 MED ORDER — BUSPIRONE HCL 7.5 MG PO TABS: 7.5000 mg | ORAL_TABLET | Freq: Three times a day (TID) | ORAL | 2 refills | Status: DC

## 2016-08-18 MED ORDER — CITALOPRAM HYDROBROMIDE 40 MG PO TABS: 40.0000 mg | ORAL_TABLET | Freq: Every day | ORAL | 4 refills | Status: DC

## 2016-08-18 MED ORDER — CLONAZEPAM 0.5 MG PO TABS: 0.2500 mg | ORAL_TABLET | Freq: Two times a day (BID) | ORAL | 0 refills | Status: DC | PRN

## 2016-08-18 NOTE — Progress Notes (Signed)
BH MD/PA/NP OP Progress Note  08/18/2016 11:53 AM Katelyn Whitehead  MRN:  [id number]  Subjective:   Patient is a 26 year old married female who presented for the follow-up appointment. She is [redacted] weeks pregnant at this time. She has not followed up since October. She stated that she was being managed by her OB and they have continued her medications. Patient reported that she has recently stopped her Klonopin and her anxiety is getting worse. She feels that she is going to die. She was anxious and apprehensive during the interview. She reported that she is due on February 6. Patient reported that she is unable to take the medications as the BuSpar is making her more anxious. We discussed about her medications. She is only sleeping one hour at night. She reported that she is concerned about going into labor and having a new baby. However she does not have any suicidal ideations or plans. She reported that she is not working at this time. Her husband is supportive but he is getting worried about her as well. Patient reported that she is glad that she came here for this appointment. She did not attend the [facility] classes and is not learning about the nursing as well. I advised patient to start watching the YouTube. He was about the birthing classes and she agreed with the plan. She reported that she will continue her medications and I will also prescribe her Klonopin on a when necessary basis to help with her anxiety. She denied having any perceptual disturbances.     Chief Complaint:  Chief Complaint    Follow-up; Medication Refill; Anxiety     Visit Diagnosis:     ICD-9-CM ICD-10-CM   1. Panic disorder 300.01 F41.0     Past Medical History:  Past Medical History:  Diagnosis Date  . ADHD (attention deficit hyperactivity disorder)   . Anxiety   . Depression   . Mild mitral and aortic regurgitation    a. 04/2013 Echo: EF 50-55%, mild AI, mild MR, no MVP.  Marland Kitchen Palpitations    a. 2012 Holter: sinus  tach, pvc's.  . Panic disorder     Past Surgical History:  Procedure Laterality Date  . WISDOM TOOTH EXTRACTION     Family History:  Family History  Problem Relation Age of Onset  . Depression Mother   . Drug abuse Father   . ADD / ADHD Brother   . Depression Maternal Aunt   . Dementia Maternal Grandmother   . Depression Maternal Grandmother    Social History:  Social History   Social History  . Marital status: Married    Spouse name: N/A  . Number of children: N/A  . Years of education: N/A   Social History Main Topics  . Smoking status: Never Smoker  . Smokeless tobacco: Never Used  . Alcohol use No  . Drug use: No  . Sexual activity: Yes    Birth control/ protection: None   Other Topics Concern  . None   Social History Narrative  . None   Additional History:  Currently married and lives with her husband and has good relationship with him.  Assessment:   Musculoskeletal: Strength & Muscle Tone: within normal limits Gait & Station: normal Patient leans: N/A  Psychiatric Specialty Exam: Depression         Past medical history includes anxiety.   Anxiety     Medication Refill     Review of Systems  Psychiatric/Behavioral: Positive for depression.  Blood pressure 103/61, pulse (!) 144, temperature 98.5 F (36.9 C), temperature source Oral, weight 170 lb 6.4 oz (77.3 kg), last menstrual period 12/09/2015.Body mass index is 31.17 kg/m.  General Appearance: Casual and Fairly Groomed  Eye Contact:  Fair  Speech:  Clear and Coherent  Volume:  Normal  Mood:  Anxious  Affect:  Congruent and Full Range  Thought Process:  Coherent  Orientation:  Full (Time, Place, and Person)  Thought Content:  WDL  Suicidal Thoughts:  No  Homicidal Thoughts:  No  Memory:  Immediate;   Fair  Judgement:  Fair  Insight:  Fair  Psychomotor Activity:  Normal  Concentration:  Fair  Recall:  [employer]  Fund of Knowledge: Fair  Language: Fair  Akathisia:  No  Handed:   Right  AIMS (if indicated):    Assets:  Communication Skills Desire for Improvement Physical Health  ADL's:  Intact  Cognition: WNL  Sleep:  7   Is the patient at risk to self?  No. Has the patient been a risk to self in the past 6 months?  No. Has the patient been a risk to self within the distant past?  No. Is the patient a risk to others?  No. Has the patient been a risk to others in the past 6 months?  No. Has the patient been a risk to others within the distant past?  No.  Current Medications: Current Outpatient Prescriptions  Medication Sig Dispense Refill  . acetaminophen (TYLENOL) 500 MG tablet Take 500 mg by mouth every 6 (six) hours as needed.    . busPIRone (BUSPAR) 7.5 MG tablet Take 1 tablet (7.5 mg total) by mouth 3 (three) times daily. 90 tablet 2  . citalopram (CELEXA) 40 MG tablet Take 1 tablet (40 mg total) by mouth daily. 30 tablet 4  . folic acid (FOLVITE) 1 MG tablet Take 1 tablet (1 mg total) by mouth daily. 90 tablet 4  . Iron-FA-B Cmp-C-Biot-Probiotic (FUSION PLUS) CAPS Take 1 capsule by mouth daily. 60 capsule 1  . Vitamin D, Ergocalciferol, (DRISDOL) 50000 units CAPS capsule Take 1 capsule (50,000 Units total) by mouth every 7 (seven) days. 30 capsule 1  . clonazePAM (KLONOPIN) 0.5 MG tablet Take 0.5 tablets (0.25 mg total) by mouth 2 (two) times daily as needed for anxiety. 5 tablet 0   Current Facility-Administered Medications  Medication Dose Route Frequency Provider Last Rate Last Dose  . pantoprazole (PROTONIX) injection 40 mg  40 mg Intravenous Q24H Melody Suzan NailerN Shambley, CNM        Medical Decision Making:  Established Problem, Stable/Improving (1) and Review of Psycho-Social Stressors (1)  Treatment Plan Summary:Medication management   Discussed with patient what the medications treatment risks benefits and alternatives.  Patient will continue on Celexa 40 mg daily. We will continue on BuSpar 7.5 mg by mouth 3 times a day. I will start her on  Klonopin 0.25 mg when necessary for anxiety. She will only be supplied with 5 pills. She will follow-up in one week or earlier. Advised her to follow-up with a therapist and she stated that she does not want to see a therapist at this time.  Advised her to start watching videos to decrease her anxiety about the childbirth and labor.  She will continue to follow-up with her OB on a regular basis    Advised patient to call if she notices worsening of her symptoms and she demonstrated understanding   This note was generated in part or whole  with voice recognition software. Voice regonition is usually quite accurate but there are transcription errors that can and very often do occur. I apologize for any typographical errors that were not detected and corrected.    Brandy Hale, MD  08/18/2016, 11:53 AM

## 2016-08-18 NOTE — Telephone Encounter (Signed)
Not seen since Oct. Will not prescribe anything.

## 2016-08-19 ENCOUNTER — Ambulatory Visit (INDEPENDENT_AMBULATORY_CARE_PROVIDER_SITE_OTHER): Payer: 59 | Admitting: Obstetrics and Gynecology

## 2016-08-19 VITALS — BP 116/79 | HR 122 | Wt 169.6 lb

## 2016-08-19 DIAGNOSIS — Z3685 Encounter for antenatal screening for Streptococcus B: Secondary | ICD-10-CM | POA: Diagnosis not present

## 2016-08-19 DIAGNOSIS — Z3493 Encounter for supervision of normal pregnancy, unspecified, third trimester: Secondary | ICD-10-CM

## 2016-08-19 DIAGNOSIS — Z113 Encounter for screening for infections with a predominantly sexual mode of transmission: Secondary | ICD-10-CM | POA: Diagnosis not present

## 2016-08-19 LAB — POCT URINALYSIS DIPSTICK
BILIRUBIN UA: NEGATIVE
GLUCOSE UA: NEGATIVE
KETONES UA: NEGATIVE
Leukocytes, UA: NEGATIVE
Nitrite, UA: NEGATIVE
Protein, UA: NEGATIVE
RBC UA: NEGATIVE
SPEC GRAV UA: 1.015
Urobilinogen, UA: 0.2
pH, UA: 7

## 2016-08-19 NOTE — Progress Notes (Signed)
ROB- cultures obtained today, pt saw her psychiatrist yesterday, pt is having a lot pelvic pressure

## 2016-08-19 NOTE — Progress Notes (Signed)
ROB- cultures obtained,discussed labor support, BC options, and pediatrician options.

## 2016-08-20 ENCOUNTER — Ambulatory Visit: Payer: 59 | Admitting: Psychiatry

## 2016-08-20 NOTE — Telephone Encounter (Signed)
pt made appt for 08-18-16.

## 2016-08-21 LAB — GC/CHLAMYDIA PROBE AMP
Chlamydia trachomatis, NAA: NEGATIVE
NEISSERIA GONORRHOEAE BY PCR: NEGATIVE

## 2016-08-21 LAB — STREP GP B NAA: Strep Gp B NAA: POSITIVE — AB

## 2016-08-25 ENCOUNTER — Ambulatory Visit: Payer: 59 | Admitting: Psychiatry

## 2016-08-27 ENCOUNTER — Other Ambulatory Visit: Payer: Self-pay | Admitting: Obstetrics and Gynecology

## 2016-08-27 ENCOUNTER — Ambulatory Visit (INDEPENDENT_AMBULATORY_CARE_PROVIDER_SITE_OTHER): Payer: 59 | Admitting: Obstetrics and Gynecology

## 2016-08-27 VITALS — BP 103/82 | HR 116 | Wt 173.0 lb

## 2016-08-27 DIAGNOSIS — Z3493 Encounter for supervision of normal pregnancy, unspecified, third trimester: Secondary | ICD-10-CM

## 2016-08-27 LAB — POCT URINALYSIS DIPSTICK
Bilirubin, UA: NEGATIVE
Blood, UA: NEGATIVE
Glucose, UA: NEGATIVE
Ketones, UA: NEGATIVE
LEUKOCYTES UA: NEGATIVE
Nitrite, UA: NEGATIVE
Spec Grav, UA: 1.01
UROBILINOGEN UA: 0.2
pH, UA: 6.5

## 2016-08-27 MED ORDER — CITALOPRAM HYDROBROMIDE 40 MG PO TABS: 40.0000 mg | ORAL_TABLET | Freq: Every day | ORAL | 4 refills | Status: DC

## 2016-08-27 NOTE — Progress Notes (Signed)
ROB- pt is having some pelvic pressure, some pressure in her rectum

## 2016-08-27 NOTE — Progress Notes (Signed)
ROB-reassured, normal end of pregnancy symptoms

## 2016-09-01 ENCOUNTER — Ambulatory Visit (INDEPENDENT_AMBULATORY_CARE_PROVIDER_SITE_OTHER): Payer: 59 | Admitting: Obstetrics and Gynecology

## 2016-09-01 ENCOUNTER — Encounter: Payer: Self-pay | Admitting: Psychiatry

## 2016-09-01 ENCOUNTER — Ambulatory Visit (INDEPENDENT_AMBULATORY_CARE_PROVIDER_SITE_OTHER): Payer: 59 | Admitting: Psychiatry

## 2016-09-01 VITALS — BP 118/76 | HR 103 | Wt 168.5 lb

## 2016-09-01 VITALS — BP 126/80 | HR 108 | Temp 97.7°F | Wt 168.6 lb

## 2016-09-01 DIAGNOSIS — F410 Panic disorder [episodic paroxysmal anxiety] without agoraphobia: Secondary | ICD-10-CM | POA: Diagnosis not present

## 2016-09-01 DIAGNOSIS — Z3493 Encounter for supervision of normal pregnancy, unspecified, third trimester: Secondary | ICD-10-CM

## 2016-09-01 LAB — POCT URINALYSIS DIPSTICK
Bilirubin, UA: NEGATIVE
GLUCOSE UA: NEGATIVE
KETONES UA: NEGATIVE
LEUKOCYTES UA: NEGATIVE
Nitrite, UA: NEGATIVE
Protein, UA: NEGATIVE
Spec Grav, UA: <=1.005
Urobilinogen, UA: 0.2
pH, UA: 6

## 2016-09-01 MED ORDER — CLONAZEPAM 0.5 MG PO TABS: ORAL_TABLET | ORAL | 0 refills | Status: DC

## 2016-09-01 MED ORDER — BUSPIRONE HCL 7.5 MG PO TABS: 7.5000 mg | ORAL_TABLET | Freq: Every day | ORAL | 2 refills | Status: DC

## 2016-09-01 NOTE — Progress Notes (Signed)
ROB- pt isn't feeling well, her sister was found dead last night-OD on heroin, she is having some pelvic pressure

## 2016-09-01 NOTE — Progress Notes (Signed)
BH MD/PA/NP OP Progress Note  09/01/2016 2:30 PM Katelyn Whitehead  MRN:  [id number]  Subjective:   Patient is a 26 year old married female who presented for the follow-up appointment. She is [redacted]  weeks pregnant at this time.She stated that she is feeling sad as her 28 year old stepsister died yesterday after heroin overdose. She was found in her apartment. Patient reported that she was close to her as she has been with the family for almost 15 years. Patient reported that she has been taking the Klonopin half pill on a daily basis and has helped with her anxiety. She is able to sleep well and has been eating well. Her symptoms are manageable now. She has discussed with her OB and they are fine with her taking the Klonopin at this time. She appeared more calm and relaxed during this interview. She came with her mother and her sister. They are very supportive. She is due within the next 2 weeks. She is taking BuSpar only once a day. She currently denied having any side effects of medications. She appears happy and excited about having her baby 2 weeks.   She denied having any perceptual disturbances.     Chief Complaint:  Chief Complaint    Follow-up; Medication Refill     Visit Diagnosis:     ICD-9-CM ICD-10-CM   1. Panic disorder 300.01 F41.0     Past Medical History:  Past Medical History:  Diagnosis Date  . ADHD (attention deficit hyperactivity disorder)   . Anxiety   . Depression   . Mild mitral and aortic regurgitation    a. 04/2013 Echo: EF 50-55%, mild AI, mild MR, no MVP.  Marland Kitchen. Palpitations    a. 2012 Holter: sinus tach, pvc's.  . Panic disorder     Past Surgical History:  Procedure Laterality Date  . WISDOM TOOTH EXTRACTION     Family History:  Family History  Problem Relation Age of Onset  . Depression Mother   . Drug abuse Father   . ADD / ADHD Brother   . Depression Maternal Aunt   . Dementia Maternal Grandmother   . Depression Maternal Grandmother    Social  History:  Social History   Social History  . Marital status: Married    Spouse name: N/A  . Number of children: N/A  . Years of education: N/A   Social History Main Topics  . Smoking status: Never Smoker  . Smokeless tobacco: Never Used  . Alcohol use No  . Drug use: No  . Sexual activity: Yes    Birth control/ protection: None   Other Topics Concern  . None   Social History Narrative  . None   Additional History:  Currently married and lives with her husband and has good relationship with him.  Assessment:   Musculoskeletal: Strength & Muscle Tone: within normal limits Gait & Station: normal Patient leans: N/A  Psychiatric Specialty Exam: Medication Refill   Anxiety     Depression         Past medical history includes anxiety.     Review of Systems  Psychiatric/Behavioral: Positive for depression.    Blood pressure 126/80, pulse (!) 108, temperature 97.7 F (36.5 C), temperature source Oral, weight 168 lb 9.6 oz (76.5 kg), last menstrual period 12/09/2015.Body mass index is 30.84 kg/m.  General Appearance: Casual and Fairly Groomed  Eye Contact:  Fair  Speech:  Clear and Coherent  Volume:  Normal  Mood:  Anxious  Affect:  Congruent and  Full Range  Thought Process:  Coherent  Orientation:  Full (Time, Place, and Person)  Thought Content:  WDL  Suicidal Thoughts:  No  Homicidal Thoughts:  No  Memory:  Immediate;   Fair  Judgement:  Fair  Insight:  Fair  Psychomotor Activity:  Normal  Concentration:  Fair  Recall:  [employer] of Knowledge: Fair  Language: Fair  Akathisia:  No  Handed:  Right  AIMS (if indicated):    Assets:  Communication Skills Desire for Improvement Physical Health  ADL's:  Intact  Cognition: WNL  Sleep:  7   Is the patient at risk to self?  No. Has the patient been a risk to self in the past 6 months?  No. Has the patient been a risk to self within the distant past?  No. Is the patient a risk to others?  No. Has the  patient been a risk to others in the past 6 months?  No. Has the patient been a risk to others within the distant past?  No.  Current Medications: Current Outpatient Prescriptions  Medication Sig Dispense Refill  . acetaminophen (TYLENOL) 500 MG tablet Take 500 mg by mouth every 6 (six) hours as needed.    . busPIRone (BUSPAR) 7.5 MG tablet Take 1 tablet (7.5 mg total) by mouth 3 (three) times daily. 90 tablet 2  . citalopram (CELEXA) 40 MG tablet Take 1 tablet (40 mg total) by mouth daily. 30 tablet 4  . folic acid (FOLVITE) 1 MG tablet Take 1 tablet (1 mg total) by mouth daily. 90 tablet 4  . Iron-FA-B Cmp-C-Biot-Probiotic (FUSION PLUS) CAPS Take 1 capsule by mouth daily. 60 capsule 1  . Vitamin D, Ergocalciferol, (DRISDOL) 50000 units CAPS capsule Take 1 capsule (50,000 Units total) by mouth every 7 (seven) days. 30 capsule 1   Current Facility-Administered Medications  Medication Dose Route Frequency Provider Last Rate Last Dose  . pantoprazole (PROTONIX) injection 40 mg  40 mg Intravenous Q24H Melody Suzan Nailer, CNM        Medical Decision Making:  Established Problem, Stable/Improving (1) and Review of Psycho-Social Stressors (1)  Treatment Plan Summary:Medication management   Discussed with patient what the medications treatment risks benefits and alternatives.  Patient will continue on Celexa 40 mg daily. We will continue on BuSpar 7.5 mg by mouth Daily I will start her on Klonopin 0.25 mg when necessary for anxiety. She will only be supplied with 7  pills. She will follow-up in one week or earlier. Advised her to follow-up with a therapist and she stated that she does not want to see a therapist at this time.  Advised her to start watching videos to decrease her anxiety about the childbirth and labor.  She will continue to follow-up with her OB on a regular basis    Advised patient to call if she notices worsening of her symptoms and she demonstrated understanding   This  note was generated in part or whole with voice recognition software. Voice regonition is usually quite accurate but there are transcription errors that can and very often do occur. I apologize for any typographical errors that were not detected and corrected.    Brandy Hale, MD  09/01/2016, 2:30 PM

## 2016-09-01 NOTE — Progress Notes (Signed)
ROB-doing well pregnancy wise, anxiety seems better, sad over sister's death but appropriately so.

## 2016-09-03 ENCOUNTER — Encounter: Payer: Self-pay | Admitting: Obstetrics and Gynecology

## 2016-09-04 ENCOUNTER — Inpatient Hospital Stay
Admission: EM | Admit: 2016-09-04 | Discharge: 2016-09-04 | Disposition: A | Discharge status: Home / Self Care | Payer: 59 | Attending: Obstetrics and Gynecology | Admitting: Obstetrics and Gynecology

## 2016-09-04 HISTORY — DX: Unspecified asthma, uncomplicated: J45.909

## 2016-09-04 NOTE — OB Triage Provider Note (Signed)
L&D OB Triage Note  Katelyn Whitehead is a 26 y.o. G2P0010 female at 3477w4d, EDD Estimated Date of Delivery: 09/14/16 who presented to triage for complaints of right thigh pain and irregular contractions.  She was evaluated by the nurses with no significant findings/findings significant for labor, but does have symptoms of sciatica. Vital signs stable. An NST was performed and has been reviewed by Me. Cervix closed, 50 -2. She was reassured and given information on stretches for sciatica.   NST INTERPRETATION: Indications: rule out uterine contractions  Mode: External Baseline Rate (A): 140 bpm Variability: Moderate Accelerations: 15 x 15 Decelerations: None     Contraction Frequency (min): irregular  Impression: reactive Right sciatic [name] hick contractions  Plan: NST performed was reviewed and was found to be reactive. She was discharged home with bleeding/labor precautions.  Continue routine prenatal care. Follow up with OB/GYN as previously scheduled.     Jaymie Mckiddy Suzan NailerN Jaja Switalski, CNM

## 2016-09-07 ENCOUNTER — Observation Stay (HOSPITAL_COMMUNITY)
Admission: EM | Admit: 2016-09-07 | Discharge: 2016-09-07 | Disposition: A | Discharge status: Home / Self Care | Payer: 59 | Source: Home / Self Care | Attending: Obstetrics and Gynecology | Admitting: Obstetrics and Gynecology

## 2016-09-07 DIAGNOSIS — Z0289 Encounter for other administrative examinations: Secondary | ICD-10-CM

## 2016-09-07 DIAGNOSIS — O9081 Anemia of the puerperium: Secondary | ICD-10-CM | POA: Diagnosis not present

## 2016-09-07 DIAGNOSIS — D649 Anemia, unspecified: Secondary | ICD-10-CM | POA: Diagnosis not present

## 2016-09-07 DIAGNOSIS — O99344 Other mental disorders complicating childbirth: Secondary | ICD-10-CM | POA: Diagnosis not present

## 2016-09-07 DIAGNOSIS — F419 Anxiety disorder, unspecified: Secondary | ICD-10-CM | POA: Diagnosis not present

## 2016-09-07 DIAGNOSIS — O4202 Full-term premature rupture of membranes, onset of labor within 24 hours of rupture: Secondary | ICD-10-CM | POA: Diagnosis not present

## 2016-09-07 DIAGNOSIS — O09899 Supervision of other high risk pregnancies, unspecified trimester: Secondary | ICD-10-CM

## 2016-09-07 DIAGNOSIS — Z2839 Other underimmunization status: Secondary | ICD-10-CM

## 2016-09-07 DIAGNOSIS — Z283 Underimmunization status: Secondary | ICD-10-CM

## 2016-09-07 DIAGNOSIS — O99824 Streptococcus B carrier state complicating childbirth: Secondary | ICD-10-CM | POA: Diagnosis not present

## 2016-09-07 DIAGNOSIS — O9989 Other specified diseases and conditions complicating pregnancy, childbirth and the puerperium: Principal | ICD-10-CM

## 2016-09-07 MED ORDER — OXYTOCIN 10 UNIT/ML IJ SOLN
INTRAMUSCULAR | Status: AC
  Filled 2016-09-07: qty 2

## 2016-09-07 MED ORDER — LIDOCAINE HCL (PF) 1 % IJ SOLN
INTRAMUSCULAR | Status: AC
  Filled 2016-09-07: qty 30

## 2016-09-07 MED ORDER — MISOPROSTOL 200 MCG PO TABS
ORAL_TABLET | ORAL | Status: AC
  Filled 2016-09-07: qty 4

## 2016-09-07 MED ORDER — AMMONIA AROMATIC IN INHA
RESPIRATORY_TRACT | Status: AC
  Filled 2016-09-07: qty 10

## 2016-09-07 NOTE — OB Triage Note (Signed)
Pt 6262w0d G1P0 complains of rupture of membranes and ctx q2253m apart. Pt rates pain 6/10 in her lower abdomen. Nitrazine negative. Pt states + FM. VSS.

## 2016-09-07 NOTE — Discharge Instructions (Signed)
Get plenty of rest and drink plenty of fluids. Call your provider with any other questions you may have.

## 2016-09-08 ENCOUNTER — Ambulatory Visit: Payer: 59 | Admitting: Psychiatry

## 2016-09-09 ENCOUNTER — Encounter: Payer: Self-pay | Admitting: Obstetrics and Gynecology

## 2016-09-09 ENCOUNTER — Encounter: Payer: Self-pay | Admitting: Certified Nurse Midwife

## 2016-09-09 ENCOUNTER — Inpatient Hospital Stay: Payer: 59 | Admitting: Anesthesiology

## 2016-09-09 ENCOUNTER — Inpatient Hospital Stay
Admission: EM | Admit: 2016-09-09 | Discharge: 2016-09-12 | DRG: 774 | Disposition: A | Discharge status: Home / Self Care | Payer: 59 | Attending: Obstetrics and Gynecology | Admitting: Obstetrics and Gynecology

## 2016-09-09 DIAGNOSIS — F419 Anxiety disorder, unspecified: Secondary | ICD-10-CM | POA: Diagnosis present

## 2016-09-09 DIAGNOSIS — D649 Anemia, unspecified: Secondary | ICD-10-CM | POA: Diagnosis not present

## 2016-09-09 DIAGNOSIS — O9989 Other specified diseases and conditions complicating pregnancy, childbirth and the puerperium: Secondary | ICD-10-CM

## 2016-09-09 DIAGNOSIS — Z3A39 39 weeks gestation of pregnancy: Secondary | ICD-10-CM

## 2016-09-09 DIAGNOSIS — O9081 Anemia of the puerperium: Secondary | ICD-10-CM | POA: Diagnosis not present

## 2016-09-09 DIAGNOSIS — L02221 Furuncle of abdominal wall: Secondary | ICD-10-CM | POA: Diagnosis present

## 2016-09-09 DIAGNOSIS — Z2839 Other underimmunization status: Secondary | ICD-10-CM

## 2016-09-09 DIAGNOSIS — O4202 Full-term premature rupture of membranes, onset of labor within 24 hours of rupture: Secondary | ICD-10-CM | POA: Diagnosis present

## 2016-09-09 DIAGNOSIS — O99344 Other mental disorders complicating childbirth: Secondary | ICD-10-CM | POA: Diagnosis present

## 2016-09-09 DIAGNOSIS — O99824 Streptococcus B carrier state complicating childbirth: Secondary | ICD-10-CM | POA: Diagnosis present

## 2016-09-09 DIAGNOSIS — Z283 Underimmunization status: Secondary | ICD-10-CM

## 2016-09-09 DIAGNOSIS — O09899 Supervision of other high risk pregnancies, unspecified trimester: Secondary | ICD-10-CM

## 2016-09-09 DIAGNOSIS — Z3403 Encounter for supervision of normal first pregnancy, third trimester: Secondary | ICD-10-CM | POA: Diagnosis not present

## 2016-09-09 DIAGNOSIS — O4292 Full-term premature rupture of membranes, unspecified as to length of time between rupture and onset of labor: Secondary | ICD-10-CM | POA: Diagnosis present

## 2016-09-09 LAB — CBC
HCT: 34.6 % — ABNORMAL LOW (ref 35.0–47.0)
HEMOGLOBIN: 11.8 g/dL — AB (ref 12.0–16.0)
MCH: 26.7 pg (ref 26.0–34.0)
MCHC: 34.1 g/dL (ref 32.0–36.0)
MCV: 78.2 fL — ABNORMAL LOW (ref 80.0–100.0)
PLATELETS: 195 10*3/uL (ref 150–440)
RBC: 4.43 MIL/uL (ref 3.80–5.20)
RDW: 18.2 % — ABNORMAL HIGH (ref 11.5–14.5)
WBC: 6.9 10*3/uL (ref 3.6–11.0)

## 2016-09-09 LAB — TYPE AND SCREEN
ABO/RH(D): O POS
ANTIBODY SCREEN: NEGATIVE

## 2016-09-09 MED ORDER — SOD CITRATE-CITRIC ACID 500-334 MG/5ML PO SOLN
30.0000 mL | ORAL | Status: DC | PRN
  Administered 2016-09-10: 30 mL via ORAL
  Filled 2016-09-09 (×2): qty 15

## 2016-09-09 MED ORDER — FENTANYL CITRATE (PF) 100 MCG/2ML IJ SOLN
50.0000 ug | INTRAMUSCULAR | Status: DC | PRN
  Administered 2016-09-09 (×2): 50 ug via INTRAVENOUS
  Administered 2016-09-09: 100 ug via INTRAVENOUS
  Administered 2016-09-09: 50 ug via INTRAVENOUS
  Filled 2016-09-09 (×3): qty 2

## 2016-09-09 MED ORDER — NALBUPHINE HCL 10 MG/ML IJ SOLN: 5.0000 mg | INTRAMUSCULAR | Status: DC | PRN

## 2016-09-09 MED ORDER — AMMONIA AROMATIC IN INHA
RESPIRATORY_TRACT | Status: AC
  Filled 2016-09-09: qty 10

## 2016-09-09 MED ORDER — NALBUPHINE HCL 10 MG/ML IJ SOLN: 5.0000 mg | Freq: Once | INTRAMUSCULAR | Status: DC | PRN

## 2016-09-09 MED ORDER — DEXTROSE 5 % IV SOLN
1.0000 ug/kg/h | INTRAVENOUS | Status: DC | PRN
  Filled 2016-09-09: qty 2

## 2016-09-09 MED ORDER — OXYTOCIN 40 UNITS IN LACTATED RINGERS INFUSION - SIMPLE MED
1.0000 m[IU]/min | INTRAVENOUS | Status: DC
  Administered 2016-09-09: 10 m[IU]/min via INTRAVENOUS
  Administered 2016-09-09: 12 m[IU]/min via INTRAVENOUS
  Administered 2016-09-09: 2 m[IU]/min via INTRAVENOUS

## 2016-09-09 MED ORDER — SODIUM CHLORIDE 0.9% FLUSH
3.0000 mL | INTRAVENOUS | Status: DC | PRN
Start: 2016-09-09 — End: 2016-09-10

## 2016-09-09 MED ORDER — SODIUM CHLORIDE 0.9 % IV SOLN
INTRAVENOUS | Status: DC | PRN
  Administered 2016-09-09 (×2): 5 mL via EPIDURAL

## 2016-09-09 MED ORDER — SODIUM CHLORIDE 0.9 % IV SOLN
INTRAVENOUS | Status: AC
  Administered 2016-09-09: 2 g via INTRAVENOUS
  Filled 2016-09-09: qty 2000

## 2016-09-09 MED ORDER — LIDOCAINE 4 % EX CREA
TOPICAL_CREAM | Freq: Two times a day (BID) | CUTANEOUS | Status: DC | PRN
  Administered 2016-09-09: 1 via TOPICAL
  Filled 2016-09-09 (×3): qty 5

## 2016-09-09 MED ORDER — LACTATED RINGERS IV SOLN
Freq: Once | INTRAVENOUS | Status: AC
  Administered 2016-09-09: 500 mL via INTRAUTERINE

## 2016-09-09 MED ORDER — ACETAMINOPHEN 325 MG PO TABS
650.0000 mg | ORAL_TABLET | ORAL | Status: DC | PRN
  Administered 2016-09-10 (×2): 650 mg via ORAL
  Filled 2016-09-09 (×2): qty 2

## 2016-09-09 MED ORDER — TERBUTALINE SULFATE 1 MG/ML IJ SOLN: 0.2500 mg | Freq: Once | INTRAMUSCULAR | Status: DC | PRN

## 2016-09-09 MED ORDER — SODIUM CHLORIDE 0.9 % IV SOLN
1.0000 g | INTRAVENOUS | Status: AC
  Administered 2016-09-09 – 2016-09-10 (×5): 1 g via INTRAVENOUS
  Filled 2016-09-09 (×5): qty 1000

## 2016-09-09 MED ORDER — DIPHENHYDRAMINE HCL 50 MG/ML IJ SOLN: 12.5000 mg | INTRAMUSCULAR | Status: DC | PRN

## 2016-09-09 MED ORDER — LIDOCAINE-EPINEPHRINE (PF) 1.5 %-1:200000 IJ SOLN
INTRAMUSCULAR | Status: DC | PRN
  Administered 2016-09-09: 3 mL via EPIDURAL

## 2016-09-09 MED ORDER — FENTANYL 2.5 MCG/ML W/ROPIVACAINE 0.2% IN NS 100 ML EPIDURAL INFUSION (ARMC-ANES)
10.0000 mL/h | EPIDURAL | Status: DC
  Administered 2016-09-09: 10 mL/h via EPIDURAL
  Filled 2016-09-09: qty 100

## 2016-09-09 MED ORDER — SODIUM CHLORIDE FLUSH 0.9 % IV SOLN
INTRAVENOUS | Status: AC
  Filled 2016-09-09: qty 10

## 2016-09-09 MED ORDER — SODIUM CHLORIDE 0.9 % IV SOLN
2.0000 g | Freq: Once | INTRAVENOUS | Status: AC
  Administered 2016-09-09: 2 g via INTRAVENOUS

## 2016-09-09 MED ORDER — LIDOCAINE HCL (PF) 1 % IJ SOLN
INTRAMUSCULAR | Status: AC
  Filled 2016-09-09: qty 30

## 2016-09-09 MED ORDER — DIPHENHYDRAMINE HCL 25 MG PO CAPS: 25.0000 mg | ORAL_CAPSULE | ORAL | Status: DC | PRN

## 2016-09-09 MED ORDER — MISOPROSTOL 200 MCG PO TABS
ORAL_TABLET | ORAL | Status: AC
  Filled 2016-09-09: qty 4

## 2016-09-09 MED ORDER — OXYTOCIN BOLUS FROM INFUSION: 500.0000 mL | Freq: Once | INTRAVENOUS | Status: DC

## 2016-09-09 MED ORDER — OXYTOCIN 40 UNITS IN LACTATED RINGERS INFUSION - SIMPLE MED
2.5000 [IU]/h | INTRAVENOUS | Status: DC
  Filled 2016-09-09: qty 1000

## 2016-09-09 MED ORDER — LIDOCAINE HCL (PF) 1 % IJ SOLN: 30.0000 mL | INTRAMUSCULAR | Status: DC | PRN

## 2016-09-09 MED ORDER — LACTATED RINGERS IV SOLN
INTRAVENOUS | Status: DC
  Administered 2016-09-09 – 2016-09-10 (×3): via INTRAVENOUS

## 2016-09-09 MED ORDER — LACTATED RINGERS IV SOLN: 500.0000 mL | INTRAVENOUS | Status: DC | PRN

## 2016-09-09 MED ORDER — LIDOCAINE HCL (PF) 1 % IJ SOLN
INTRAMUSCULAR | Status: DC | PRN
  Administered 2016-09-09 (×3): 1 mL via INTRADERMAL

## 2016-09-09 MED ORDER — BUTORPHANOL TARTRATE 1 MG/ML IJ SOLN
1.0000 mg | INTRAMUSCULAR | Status: DC | PRN
  Administered 2016-09-09: 1 mg via INTRAVENOUS
  Filled 2016-09-09: qty 1

## 2016-09-09 MED ORDER — NALOXONE HCL 0.4 MG/ML IJ SOLN: 0.4000 mg | INTRAMUSCULAR | Status: DC | PRN

## 2016-09-09 MED ORDER — OXYTOCIN 10 UNIT/ML IJ SOLN
INTRAMUSCULAR | Status: AC
  Filled 2016-09-09: qty 2

## 2016-09-09 MED ORDER — ONDANSETRON HCL 4 MG/2ML IJ SOLN
4.0000 mg | Freq: Four times a day (QID) | INTRAMUSCULAR | Status: DC | PRN
  Administered 2016-09-09 – 2016-09-10 (×2): 4 mg via INTRAVENOUS
  Filled 2016-09-09 (×2): qty 2

## 2016-09-09 NOTE — H&P (Signed)
Obstetric History and Physical  Katelyn Whitehead is a 26 y.o. G2P0010 with IUP at 2751w2d presenting with SROM at 0645am and irregular conttractions. Patient states she has been having  irregular, every 2-3 minutes contractions, none vaginal bleeding, ruptured, meconium light membranes, with active fetal movement.    [facility]  Pregnancy complications or risks:severe anxiety-on high risk meds,  Prenatal labs and studies: ABO, Rh: --/Positive/-- (07/12 1545) Antibody: NEG (06/05 1218) Rubella: <20.0 (07/12 1545) RPR: Non Reactive (07/12 1545)  HBsAg: Negative (01/02 1206)  HIV: Non Reactive (07/12 1545)  WUJ:WJXBJYNWGBS:Positive (01/11 1445) 1 hr Glucola  normal Genetic screening normal Anatomy US normal  Past Medical History:  Diagnosis Date  . ADHD (attention deficit hyperactivity disorder)   . Anxiety   . Asthma    childhood  . Depression   . Mild mitral and aortic regurgitation    a. 04/2013 Echo: EF 50-55%, mild AI, mild MR, no MVP.  Marland Kitchen. Palpitations    a. 2012 Holter: sinus tach, pvc's.  . Panic disorder     Past Surgical History:  Procedure Laterality Date  . WISDOM TOOTH EXTRACTION      OB History  Gravida Para Term Preterm AB Living  2       1    SAB TAB Ectopic Multiple Live Births  1            # Outcome Date GA Lbr Len/2nd Weight Sex Delivery Anes PTL Lv  2 Current           1 SAB 12/09/15 4710w0d      N FD      Social History   Social History  . Marital status: Married    Spouse name: N/A  . Number of children: N/A  . Years of education: N/A   Social History Main Topics  . Smoking status: Never Smoker  . Smokeless tobacco: Never Used  . Alcohol use No  . Drug use: No  . Sexual activity: Yes    Birth control/ protection: None   Other Topics Concern  . None   Social History Narrative  . None    Family History  Problem Relation Age of Onset  . Depression Mother   . Drug abuse Father   . ADD / ADHD Brother   . Depression  Maternal Aunt   . Dementia Maternal Grandmother   . Depression Maternal Grandmother     Facility-Administered Medications Prior to Admission  Medication Dose Route Frequency Provider Last Rate Last Dose  . pantoprazole (PROTONIX) injection 40 mg  40 mg Intravenous Q24H Melody N Shambley, CNM       Prescriptions Prior to Admission  Medication Sig Dispense Refill Last Dose  . acetaminophen (TYLENOL) 500 MG tablet Take 500 mg by mouth every 6 (six) hours as needed.   09/06/2016 at Unknown time  . citalopram (CELEXA) 40 MG tablet Take 1 tablet (40 mg total) by mouth daily. 30 tablet 4 09/06/2016 at Unknown time  . clonazePAM (KLONOPIN) 0.5 MG tablet Take 1/2 pill daily- 14 days supply. 7 tablet 0 Past Week at Unknown time  . folic acid (FOLVITE) 1 MG tablet Take 1 tablet (1 mg total) by mouth daily. 90 tablet 4 Past Month at Unknown time  . Iron-FA-B Cmp-C-Biot-Probiotic (FUSION PLUS) CAPS Take 1 capsule by mouth daily. 60 capsule 1 Past Month at Unknown time  . Prenatal Vit-Fe Fumarate-FA (MULTIVITAMIN-PRENATAL) 27-0.8 MG TABS tablet Take 1 tablet by mouth daily at  12 noon.   09/06/2016 at Unknown time  . Vitamin D, Ergocalciferol, (DRISDOL) 50000 units CAPS capsule Take 1 capsule (50,000 Units total) by mouth every 7 (seven) days. 30 capsule 1 Past Week at Unknown time  . busPIRone (BUSPAR) 7.5 MG tablet Take 1 tablet (7.5 mg total) by mouth daily before supper. Pt has supply 90 tablet 2 09/02/2016    Allergies  Allergen Reactions  . Influenza Vaccines Hives  . Sulfa Antibiotics     Hives Rash    Review of Systems: Negative except for what is mentioned in HPI.  Physical Exam: BP 125/73 (BP Location: Left Arm)   Pulse 97   Temp 97.9 F (36.6 C) (Oral)   Resp 20   Ht 5\' 2"  (1.575 m)   Wt 168 lb (76.2 kg)   LMP 12/09/2015 (LMP Unknown) Comment: day pt miscarriage  BMI 30.73 kg/m  GENERAL: Well-developed, well-nourished female in no acute distress.  LUNGS: Clear to auscultation  bilaterally.  HEART: Regular rate and rhythm. ABDOMEN: Soft, nontender, nondistended, gravid. EXTREMITIES: Nontender, no edema, 2+ distal pulses. Cervical Exam:   FHT:  Baseline rate 144 bpm   Variability minimal  Accelerations absent   Decelerations none Contractions: Every 2-3 mins   Pertinent Labs/Studies:   No results found for this or any previous visit (from the past 24 hour(s)).  Assessment : Katelyn Whitehead is a 26 y.o. G2P0010 at [redacted]w[redacted]d being admitted for labor.  Plan: Labor: Expectant management.  Induction/Augmentation as needed, per protocol FWB: Reassuring fetal heart tracing.  GBS positive Delivery plan: Hopeful for vaginal delivery  Melody Shambley, CNM [facility]

## 2016-09-09 NOTE — Anesthesia Procedure Notes (Addendum)
Epidural Patient location during procedure: OB Start time: 09/09/2016 10:31 PM End time: 09/09/2016 10:38 PM  Staffing Anesthesiologist: Margorie JohnPISCITELLO, JOSEPH K Performed: anesthesiologist   Preanesthetic Checklist Completed: patient identified, site marked, surgical consent, pre-op evaluation, timeout performed, IV checked, risks and benefits discussed and monitors and equipment checked  Epidural Patient position: sitting Prep: Betadine Patient monitoring: heart rate, continuous pulse ox and blood pressure Approach: midline Location: L3-L4 Injection technique: LOR saline  Needle:  Needle type: Tuohy  Needle gauge: 17 G Needle length: 9 cm and 9 Needle insertion depth: 5 cm Catheter type: closed end flexible Catheter size: 19 Gauge Catheter at skin depth: 10 cm Test dose: negative and 1.5% lidocaine with Epi 1:200 K  Assessment Sensory level: T10 Events: blood not aspirated, injection not painful, no injection resistance, negative IV test and no paresthesia  Additional Notes Paresthesia which resolved with needle retraction and redirection Patient had trouble obtaining and holding requested positioning  Catheter hub removed and replaced under sterile conditions because of inability to bolus test dose through.  Pt. Evaluated and documentation done after procedure finished. Patient identified. Risks/Benefits/Options discussed with patient including but not limited to bleeding, infection, nerve damage, paralysis, failed block, incomplete pain control, headache, blood pressure changes, nausea, vomiting, reactions to medication both or allergic, itching and postpartum back pain. Confirmed with bedside nurse the patient's most recent platelet count. Confirmed with patient that they are not currently taking any anticoagulation, have any bleeding history or any family history of bleeding disorders. Patient expressed understanding and wished to proceed. All questions were answered. Sterile  technique was used throughout the entire procedure. Please see nursing notes for vital signs. Test dose was given through epidural catheter and negative prior to continuing to dose epidural or start infusion. Warning signs of high block given to the patient including shortness of breath, tingling/numbness in hands, complete motor block, or any concerning symptoms with instructions to call for help. Patient was given instructions on fall risk and not to get out of bed. All questions and concerns addressed with instructions to call with any issues or inadequate analgesia.   Patient tolerated the insertion well without immediate complications.Reason for block:procedure for pain

## 2016-09-09 NOTE — Progress Notes (Signed)
Katelyn Whitehead is a 26 y.o. G2P0010 at 257w2d by LMP admitted for rupture of membranes  Subjective: Reports moderate pain with contractions  Objective: BP 110/72   Pulse 82   Temp 98.2 F (36.8 C) (Oral)   Resp 20   Ht 5\' 2"  (1.575 m)   Wt 168 lb (76.2 kg)   LMP 12/09/2015 (LMP Unknown) Comment: day pt miscarriage  BMI 30.73 kg/m  No intake/output data recorded. Total I/O In: 1525 [P.O.:600; I.V.:875; IV Piggyback:50] Out: -   FHT:  FHR: 144 bpm, variability: moderate,  accelerations:  Present,  decelerations:  Absent UC:   irregular, every 2-4 minutes SVE:   Dilation: 3.5 Effacement (%): 80 Station: -2 Exam by:: Katelyn Whitehead CNM  Labs: Lab Results  Component Value Date   WBC 6.9 09/09/2016   HGB 11.8 (L) 09/09/2016   HCT 34.6 (L) 09/09/2016   MCV 78.2 (L) 09/09/2016   PLT 195 09/09/2016    Assessment / Plan: Augmentation of labor, progressing well  Labor: Progressing normally Preeclampsia:  labs stable Fetal Wellbeing:  Category I Pain Control:  IV pain meds I/D:  n/a Anticipated MOD:  NSVD  Katelyn Whitehead 09/09/2016, 5:50 PM

## 2016-09-09 NOTE — Anesthesia Preprocedure Evaluation (Signed)
Anesthesia Evaluation  Patient identified by MRN, date of birth, ID band Patient awake    Reviewed: Allergy & Precautions, H&P , NPO status , Patient's Chart, lab work & pertinent test results  History of Anesthesia Complications Negative for: history of anesthetic complications  Airway Mallampati: III  TM Distance: >3 FB Neck ROM: full    Dental  (+) Poor Dentition, Chipped   Pulmonary neg shortness of breath, asthma ,    Pulmonary exam normal breath sounds clear to auscultation       Cardiovascular Exercise Tolerance: Good (-) angina(-) Past MI and (-) DOE Normal cardiovascular exam+ Valvular Problems/Murmurs AI  Rhythm:regular Rate:Normal     Neuro/Psych  Headaches, PSYCHIATRIC DISORDERS Anxiety Depression    GI/Hepatic negative GI ROS,   Endo/Other    Renal/GU   negative genitourinary   Musculoskeletal   Abdominal   Peds  Hematology negative hematology ROS (+)   Anesthesia Other Findings Past Medical History: No date: ADHD (attention deficit hyperactivity disorder) No date: Anxiety No date: Asthma     Comment: childhood No date: Depression No date: Mild mitral and aortic regurgitation     Comment: a. 04/2013 Echo: EF 50-55%, mild AI, mild MR,               no MVP. No date: Palpitations     Comment: a. 2012 Holter: sinus tach, pvc's. No date: Panic disorder  Past Surgical History: No date: WISDOM TOOTH EXTRACTION  BMI    Body Mass Index:  30.73 kg/m      Reproductive/Obstetrics (+) Pregnancy                             Anesthesia Physical Anesthesia Plan  ASA: III  Anesthesia Plan: Epidural   Post-op Pain Management:    Induction:   Airway Management Planned:   Additional Equipment:   Intra-op Plan:   Post-operative Plan:   Informed Consent: I have reviewed the patients History and Physical, chart, labs and discussed the procedure including the risks,  benefits and alternatives for the proposed anesthesia with the patient or authorized representative who has indicated his/her understanding and acceptance.     Plan Discussed with: Anesthesiologist  Anesthesia Plan Comments:         Anesthesia Quick Evaluation

## 2016-09-09 NOTE — OB Triage Note (Signed)
Ms. Levada SchillingSummers here with c/o SROM at 0645, green in color. Reports contractions starting at the same time. Positive fetal movement, denies bleeding.

## 2016-09-09 NOTE — Progress Notes (Signed)
Katelyn Whitehead is a 26 y.o. G2P0010 at 317w2d by LMP admitted for rupture of membranes  Subjective:  Reports moderate pain relief with IV fentanyl, but reports pain with straps touching a boil on lower abdomen and groin Objective: BP 112/75   Pulse 89   Temp 98.8 F (37.1 C) (Oral)   Resp 20   Ht 5\' 2"  (1.575 m)   Wt 168 lb (76.2 kg)   LMP 12/09/2015 (LMP Unknown) Comment: day pt miscarriage  BMI 30.73 kg/m  No intake/output data recorded. Total I/O In: 204.2 [I.V.:204.2] Out: -   FHT:  FHR: 144 bpm, variability: minimal ,  accelerations:  Present,  decelerations:  Absent UC:   irregular, every 3-5 minutes, mild to palpation, IUPC placed SVE:   Dilation: 1.5 Effacement (%): 50 Station: -2 Exam by:: Charles SchwabJCM  Labs: Lab Results  Component Value Date   WBC 6.9 09/09/2016   HGB 11.8 (L) 09/09/2016   HCT 34.6 (L) 09/09/2016   MCV 78.2 (L) 09/09/2016   PLT 195 09/09/2016    Assessment / Plan: SROM will order lidocaine gel for boils  Labor: early labor Preeclampsia:  labs stable Fetal Wellbeing:  Category I Pain Control:  IV pain meds I/D:  n/a Anticipated MOD:  NSVD  Katelyn Whitehead 09/09/2016, 1:36 PM

## 2016-09-10 DIAGNOSIS — Z3403 Encounter for supervision of normal first pregnancy, third trimester: Secondary | ICD-10-CM

## 2016-09-10 LAB — RPR: RPR: NONREACTIVE

## 2016-09-10 MED ORDER — CALCIUM CARBONATE ANTACID 500 MG PO CHEW
1.0000 | CHEWABLE_TABLET | Freq: Once | ORAL | Status: AC
  Administered 2016-09-10: 200 mg via ORAL
  Filled 2016-09-10: qty 1

## 2016-09-10 MED ORDER — VARICELLA VIRUS VACCINE LIVE 1350 PFU/0.5ML IJ SUSR
0.5000 mL | Freq: Once | INTRAMUSCULAR | Status: DC
  Filled 2016-09-10: qty 0.5

## 2016-09-10 MED ORDER — BENZOCAINE-MENTHOL 20-0.5 % EX AERO
1.0000 "application " | INHALATION_SPRAY | CUTANEOUS | Status: DC | PRN
  Administered 2016-09-10: 1 via TOPICAL
  Filled 2016-09-10 (×2): qty 56

## 2016-09-10 MED ORDER — DIBUCAINE 1 % RE OINT: 1.0000 "application " | TOPICAL_OINTMENT | RECTAL | Status: DC | PRN

## 2016-09-10 MED ORDER — OXYCODONE HCL 5 MG PO TABS: 10.0000 mg | ORAL_TABLET | ORAL | Status: DC | PRN

## 2016-09-10 MED ORDER — CITALOPRAM HYDROBROMIDE 20 MG PO TABS
40.0000 mg | ORAL_TABLET | Freq: Every day | ORAL | Status: DC
  Administered 2016-09-10 – 2016-09-11 (×2): 40 mg via ORAL
  Filled 2016-09-10 (×3): qty 2

## 2016-09-10 MED ORDER — SIMETHICONE 80 MG PO CHEW: 80.0000 mg | CHEWABLE_TABLET | ORAL | Status: DC | PRN

## 2016-09-10 MED ORDER — PANTOPRAZOLE SODIUM 40 MG IV SOLR
40.0000 mg | INTRAVENOUS | Status: DC
  Administered 2016-09-10: 40 mg via INTRAVENOUS
  Filled 2016-09-10: qty 40

## 2016-09-10 MED ORDER — SENNOSIDES-DOCUSATE SODIUM 8.6-50 MG PO TABS
2.0000 | ORAL_TABLET | ORAL | Status: DC
  Administered 2016-09-10 – 2016-09-11 (×2): 2 via ORAL
  Filled 2016-09-10 (×2): qty 2

## 2016-09-10 MED ORDER — MEASLES, MUMPS & RUBELLA VAC ~~LOC~~ INJ
0.5000 mL | INJECTION | Freq: Once | SUBCUTANEOUS | Status: DC
  Filled 2016-09-10: qty 0.5

## 2016-09-10 MED ORDER — DOCUSATE SODIUM 100 MG PO CAPS
100.0000 mg | ORAL_CAPSULE | Freq: Two times a day (BID) | ORAL | Status: DC
  Administered 2016-09-10 – 2016-09-12 (×4): 100 mg via ORAL
  Filled 2016-09-10 (×4): qty 1

## 2016-09-10 MED ORDER — COCONUT OIL OIL
1.0000 "application " | TOPICAL_OIL | Status: DC | PRN
  Administered 2016-09-11: 1 via TOPICAL
  Filled 2016-09-10: qty 120

## 2016-09-10 MED ORDER — OXYCODONE HCL 5 MG PO TABS: 5.0000 mg | ORAL_TABLET | ORAL | Status: DC | PRN

## 2016-09-10 MED ORDER — PRENATAL MULTIVITAMIN CH
1.0000 | ORAL_TABLET | Freq: Every day | ORAL | Status: DC
  Administered 2016-09-11 – 2016-09-12 (×2): 1 via ORAL
  Filled 2016-09-10 (×2): qty 1

## 2016-09-10 MED ORDER — AMMONIA AROMATIC IN INHA
RESPIRATORY_TRACT | Status: AC
  Filled 2016-09-10: qty 10

## 2016-09-10 MED ORDER — ZOLPIDEM TARTRATE 5 MG PO TABS: 5.0000 mg | ORAL_TABLET | Freq: Every evening | ORAL | Status: DC | PRN

## 2016-09-10 MED ORDER — ROPIVACAINE HCL 2 MG/ML IJ SOLN
10.0000 mL/h | INTRAMUSCULAR | Status: DC
  Administered 2016-09-10: 10 mL/h via EPIDURAL
  Filled 2016-09-10 (×2): qty 5

## 2016-09-10 MED ORDER — WITCH HAZEL-GLYCERIN EX PADS: 1.0000 "application " | MEDICATED_PAD | CUTANEOUS | Status: DC | PRN

## 2016-09-10 MED ORDER — SODIUM CHLORIDE 0.9 % IV SOLN
1.0000 g | INTRAVENOUS | Status: DC
  Administered 2016-09-10: 1 g via INTRAVENOUS
  Filled 2016-09-10 (×2): qty 1000

## 2016-09-10 MED ORDER — HYDROXYZINE HCL 25 MG PO TABS
50.0000 mg | ORAL_TABLET | Freq: Three times a day (TID) | ORAL | Status: DC | PRN
  Administered 2016-09-10: 50 mg via ORAL
  Filled 2016-09-10: qty 2

## 2016-09-10 MED ORDER — SODIUM CHLORIDE 0.9 % IJ SOLN
INTRAMUSCULAR | Status: AC
  Administered 2016-09-10: 20:00:00
  Filled 2016-09-10: qty 10

## 2016-09-10 MED ORDER — ONDANSETRON HCL 4 MG/2ML IJ SOLN: 4.0000 mg | INTRAMUSCULAR | Status: DC | PRN

## 2016-09-10 MED ORDER — DIPHENHYDRAMINE HCL 25 MG PO CAPS: 25.0000 mg | ORAL_CAPSULE | Freq: Four times a day (QID) | ORAL | Status: DC | PRN

## 2016-09-10 MED ORDER — ONDANSETRON HCL 4 MG PO TABS: 4.0000 mg | ORAL_TABLET | ORAL | Status: DC | PRN

## 2016-09-10 MED ORDER — IBUPROFEN 600 MG PO TABS
600.0000 mg | ORAL_TABLET | Freq: Four times a day (QID) | ORAL | Status: DC
  Administered 2016-09-10 – 2016-09-12 (×8): 600 mg via ORAL
  Filled 2016-09-10 (×8): qty 1

## 2016-09-10 MED ORDER — ACETAMINOPHEN 325 MG PO TABS: 650.0000 mg | ORAL_TABLET | ORAL | Status: DC | PRN

## 2016-09-10 MED ORDER — CLONAZEPAM 0.5 MG PO TABS: 0.5000 mg | ORAL_TABLET | Freq: Two times a day (BID) | ORAL | Status: DC | PRN

## 2016-09-10 NOTE — Progress Notes (Signed)
Katelyn Whitehead is a 26 y.o. G2P0010 at [redacted]w[redacted]d by LMP admitted for rupture of membranes  Subjective:  Feels some pressure with contractions Objective: BP 129/84 (BP Location: Right Arm)   Pulse 90   Temp 98.8 F (37.1 C) (Oral)   Resp 18   Ht 5\' 2"  (1.575 m)   Wt 168 lb (76.2 kg)   LMP 12/09/2015 (LMP Unknown) Comment: day pt miscarriage  SpO2 98%   BMI 30.73 kg/m  I/O last 3 completed shifts: In: 1583.9 [P.O.:600; I.V.:883.9; IV Piggyback:100] Out: -  Total I/O In: 1296.8 [I.V.:1296.8] Out: -   FHT:  FHR: 145 bpm, variability: moderate,  accelerations:  Present,  decelerations:  Absent UC:   regular, every 2 minutes on 5920mu/min pitocin SVE:   Dilation: 6 Effacement (%): 90 Station: -1 Exam by:: Manual MeierN. Aten, RN  Labs: Lab Results  Component Value Date   WBC 6.9 09/09/2016   HGB 11.8 (L) 09/09/2016   HCT 34.6 (L) 09/09/2016   MCV 78.2 (L) 09/09/2016   PLT 195 09/09/2016    Assessment / Plan: Augmentation of labor, progressing well  Labor: Progressing normally Preeclampsia:  labs stable Fetal Wellbeing:  Category I Pain Control:  Epidural I/D:  n/a Anticipated MOD:  NSVD  Katelyn Whitehead 09/10/2016, 1:46 AM

## 2016-09-10 NOTE — Progress Notes (Signed)
Collier SalinaKatie O Gorelik is a 26 y.o. G2P0010 at 6165w3d by LMP admitted for rupture of membranes  Subjective: Denies pain, just some pressure  Objective: BP 111/60   Pulse 84   Temp 99.1 F (37.3 C) (Oral)   Resp 20   Ht 5\' 2"  (1.575 m)   Wt 168 lb (76.2 kg)   LMP 12/09/2015 (LMP Unknown) Comment: day pt miscarriage  SpO2 96%   BMI 30.73 kg/m  I/O last 3 completed shifts: In: 6101.7 [P.O.:600; I.V.:4710.4; Other:541.3; IV Piggyback:250] Out: 200 [Emesis/NG output:200] Total I/O In: 29.5 [I.V.:29.5] Out: -   FHT:  FHR: 155 bpm, variability: moderate,  accelerations:  Present,  decelerations:  Absent UC:   regular, every 2 minutes, on 6310mu/min SVE:   Dilation: 8.5 Effacement (%): 90 Station: -1 Exam by:: k. yates, rn  Labs: Lab Results  Component Value Date   WBC 6.9 09/09/2016   HGB 11.8 (L) 09/09/2016   HCT 34.6 (L) 09/09/2016   MCV 78.2 (L) 09/09/2016   PLT 195 09/09/2016    Assessment / Plan: Augmentation of labor, progressing well  Labor: Progressing normally Preeclampsia:  labs stable Fetal Wellbeing:  Category I Pain Control:  Epidural I/D:  n/a Anticipated MOD:  NSVD  Meshia Rau N Kayti Poss 09/10/2016, 8:23 AM

## 2016-09-10 NOTE — Progress Notes (Signed)
Katelyn Whitehead is a 26 y.o. G2P0010 at 11036w3d by LMP admitted for rupture of membranes  Subjective: Doing well with second stage labor  Objective: BP 122/83   Pulse 86   Temp 98.3 F (36.8 C) (Oral)   Resp 20   Ht 5\' 2"  (1.575 m)   Wt 168 lb (76.2 kg)   LMP 12/09/2015 (LMP Unknown) Comment: day pt miscarriage  SpO2 98%   BMI 30.73 kg/m  I/O last 3 completed shifts: In: 6101.7 [P.O.:600; I.V.:4710.4; Other:541.3; IV Piggyback:250] Out: 200 [Emesis/NG output:200] Total I/O In: 699.5 [I.V.:649.5; IV Piggyback:50] Out: 700 [Urine:700]  FHT:  FHR: 148 bpm, variability: moderate,  accelerations:  Present,  decelerations:  Present early and variable UC:   irregular, every 3-5 minutes SVE:   Dilation: 10 Effacement (%): 100 Station: +1 Exam by:: k.yates, rn  Labs: Lab Results  Component Value Date   WBC 6.9 09/09/2016   HGB 11.8 (L) 09/09/2016   HCT 34.6 (L) 09/09/2016   MCV 78.2 (L) 09/09/2016   PLT 195 09/09/2016    Assessment / Plan: Augmentation of labor, progressing well, pushing  Labor: Progressing normally Preeclampsia:  labs stable Fetal Wellbeing:  Category I Pain Control:  Epidural I/D:  n/a Anticipated MOD:  NSVD  Katelyn Whitehead 09/10/2016, 11:42 AM

## 2016-09-10 NOTE — Progress Notes (Signed)
VAD of viable female infant, vacuum placed initially at 1417, traction applied with consecutive pushes with maternal pushing effort, 6x -vacuum , no pop offs, delivery at 1451.

## 2016-09-10 NOTE — Progress Notes (Signed)
Spoke with Galen ManilaM. Shambley, CNM, provided update to pt current status, 8cm, c/o heartburn, nausea and vomiting. Pt remains R side lying, c/o pain in R lower abdomen,  Worsening over time. Epidural working well. Will call anesthesia for additional orders.

## 2016-09-11 LAB — CBC
HEMATOCRIT: 27.4 % — AB (ref 35.0–47.0)
HEMOGLOBIN: 9.3 g/dL — AB (ref 12.0–16.0)
MCH: 26.6 pg (ref 26.0–34.0)
MCHC: 33.9 g/dL (ref 32.0–36.0)
MCV: 78.4 fL — AB (ref 80.0–100.0)
Platelets: 146 10*3/uL — ABNORMAL LOW (ref 150–440)
RBC: 3.49 MIL/uL — ABNORMAL LOW (ref 3.80–5.20)
RDW: 17.8 % — ABNORMAL HIGH (ref 11.5–14.5)
WBC: 13.4 10*3/uL — ABNORMAL HIGH (ref 3.6–11.0)

## 2016-09-11 MED ORDER — PANTOPRAZOLE SODIUM 40 MG PO TBEC
40.0000 mg | DELAYED_RELEASE_TABLET | Freq: Every day | ORAL | Status: DC
  Administered 2016-09-11: 40 mg via ORAL
  Filled 2016-09-11: qty 1

## 2016-09-11 NOTE — Progress Notes (Signed)
PHARMACIST - PHYSICIAN COMMUNICATION   CONCERNING: IV to Oral Route Change Policy  RECOMMENDATION: This patient is receiving PANTOPRAZOLE by the intravenous route.  Based on criteria approved by the Pharmacy and Therapeutics Committee, the intravenous medication(s) is/are being converted to the equivalent oral dose form(s).   DESCRIPTION: These criteria include:  The patient is eating (either orally or via tube) and/or has been taking other orally administered medications for a least 24 hours  The patient has no evidence of active gastrointestinal bleeding or impaired GI absorption (gastrectomy, short bowel, patient on TNA or NPO).  If you have questions about this conversion, please contact the Pharmacy Department  []   [phone]( [phone] )  [facility] [x]   [phone]( [phone] )  [facility] []   [phone]( [phone] )  [facility] []   [phone]( [phone] )  [facility] []   [phone]( [phone] )  [facility]   ArdmoreMerrill,Martavis Gurney A, [facility] Center[facility] 09/11/2016 4:16 PM

## 2016-09-11 NOTE — Progress Notes (Signed)
Post Partum Day # 1, s/p VAVD  Subjective: no complaints, up ad lib, voiding and tolerating PO.  Notes the Vestaril given last night made her dizzy.   Objective: Temp:  [97.6 F (36.4 C)-100.1 F (37.8 C)] 97.6 F (36.4 C) (02/03 0745) Pulse Rate:  [78-107] 81 (02/03 0745) Resp:  [16-20] 16 (02/03 0745) BP: (101-131)/(55-83) 110/63 (02/03 0745) SpO2:  [97 %-100 %] 97 % (02/03 0745)  Physical Exam:  General: alert and no distress  Lungs: clear to auscultation bilaterally Breasts: normal appearance, no masses or tenderness Heart: regular rate and rhythm, S1, S2 normal, no murmur, click, rub or gallop Pelvis: Lochia: appropriate, Uterine Fundus: firm Extremities: DVT Evaluation: No evidence of DVT seen on physical exam. Negative Homan's sign. No cords or calf tenderness. No significant calf/ankle edema.   Recent Labs  09/09/16 0859 09/11/16 0511  HGB 11.8* 9.3*  HCT 34.6* 27.4*    Assessment/Plan: Plan for discharge tomorrow, Breastfeeding, Lactation consult and Contraception OCPs Mild anemia, postpartum. Asymptomatic. Will treat with PO iron supplementation Anxiety disorder, currently on Celexa, holding Klonopin.  Patient aware of cautions with breastfeeding. Will discontinue Vestaril. Patient notes she can try Benadryl or Ambien for sleep tonight.    LOS: 2 days   Hildred LaserAnika Ashlee Bewley [facility]

## 2016-09-11 NOTE — Anesthesia Postprocedure Evaluation (Signed)
Anesthesia Post Note  Patient: Katelyn Whitehead  Procedure(s) Performed: * No procedures listed *  Patient location during evaluation: Mother Baby Anesthesia Type: Epidural Level of consciousness: awake and alert Pain management: pain level controlled Vital Signs Assessment: post-procedure vital signs reviewed and stable Respiratory status: spontaneous breathing, nonlabored ventilation and respiratory function stable Cardiovascular status: stable Postop Assessment: no headache, no backache and patient able to bend at knees Anesthetic complications: no     Last Vitals:  Vitals:   09/11/16 0358 09/11/16 0745  BP: 102/65 110/63  Pulse: 78 81  Resp: 20 16  Temp: 36.6 C 36.4 C    Last Pain:  Vitals:   09/11/16 0745  TempSrc: Oral  PainSc:                  Cleda MccreedyJoseph K Terria Deschepper

## 2016-09-12 MED ORDER — DOCUSATE SODIUM 100 MG PO CAPS: 100.0000 mg | ORAL_CAPSULE | Freq: Two times a day (BID) | ORAL | 2 refills | Status: DC | PRN

## 2016-09-12 MED ORDER — PRENATAL MULTIVITAMIN CH: 1.0000 | ORAL_TABLET | Freq: Every day | ORAL | 3 refills | Status: DC

## 2016-09-12 MED ORDER — IBUPROFEN 800 MG PO TABS: 800.0000 mg | ORAL_TABLET | Freq: Three times a day (TID) | ORAL | 1 refills | Status: DC | PRN

## 2016-09-12 NOTE — Progress Notes (Addendum)
Post Partum Day # 2, s/p VAVD  Subjective: no complaints, up ad lib, voiding and tolerating PO.    Objective: Vitals:   09/11/16 1239 09/11/16 1620 09/11/16 2047 09/12/16 0728  BP: (!) 97/59 112/67 115/63 118/65  Pulse: 92 99 99 86  Resp: 17 16 18 16   Temp: 98 F (36.7 C) 97.4 F (36.3 C) 98 F (36.7 C) 97.6 F (36.4 C)  TempSrc: Oral Oral Axillary Oral  SpO2: 97% 98% 99% 98%  Weight:      Height:        Physical Exam:  General: alert and no distress  Lungs: clear to auscultation bilaterally Breasts: normal appearance, no masses or tenderness Heart: regular rate and rhythm, S1, S2 normal, no murmur, click, rub or gallop Pelvis: Lochia: appropriate, Uterine Fundus: firm Extremities: DVT Evaluation: No evidence of DVT seen on physical exam. Negative Homan's sign. No cords or calf tenderness. No significant calf/ankle edema.   Recent Labs  09/11/16 0511  HGB 9.3*  HCT 27.4*    Assessment/Plan  Discharge home, Breastfeeding and Contraception OCPs Mild anemia, postpartum. Asymptomatic. Will treat with PO iron supplementation Anxiety disorder, currently on Celexa.  Notes that she spoke with lactation consultant, who notes that patient can take a 1/2 tablet Klonopin every now and then as needed while breasfteeding.  Patient aware of cautions with breastfeeding. Recommended that patient f/u outpatient to psychiatrist for continued management of anxiety disorder.    LOS: 3 days   Hildred LaserAnika Virgle Arth, MD [facility]

## 2016-09-12 NOTE — Discharge Instructions (Signed)

## 2016-09-12 NOTE — Discharge Summary (Addendum)
Obstetric Discharge Summary Reason for Admission: rupture of membranes Prenatal Procedures: ultrasound Intrapartum Procedures: spontaneous vaginal delivery, vacuum and GBS prophylaxis Postpartum Procedures: none, Rubella Ig and Varicella Ig Complications-Operative and Postpartum: vaginal laceration (left labial)  Hemoglobin  Date Value Ref Range Status  09/11/2016 9.3 (L) 12.0 - 16.0 g/dL Final   HGB  Date Value Ref Range Status  11/06/2013 13.4 12.0 - 16.0 g/dL Final   HCT  Date Value Ref Range Status  09/11/2016 27.4 (L) 35.0 - 47.0 % Final   Hematocrit  Date Value Ref Range Status  08/10/2016 34.0 34.0 - 46.6 % Final    Physical Exam:  Vitals:   09/11/16 1239 09/11/16 1620 09/11/16 2047 09/12/16 0728  BP: (!) 97/59 112/67 115/63 118/65  Pulse: 92 99 99 86  Resp: 17 16 18 16   Temp: 98 F (36.7 C) 97.4 F (36.3 C) 98 F (36.7 C) 97.6 F (36.4 C)  TempSrc: Oral Oral Axillary Oral  SpO2: 97% 98% 99% 98%  Weight:      Height:       General: alert and no distress Lochia: appropriate Uterine Fundus: firm Incision: none DVT Evaluation: No evidence of DVT seen on physical exam. Negative Homan's sign. No cords or calf tenderness. No significant calf/ankle edema.  Discharge Diagnoses: Term Pregnancy-delivered and Generalized anxiety disorder  Discharge Information: Date: 09/12/2016 Activity: pelvic rest Diet: routine Medications: PNV, Ibuprofen, Colace and Iron, Celexa, Klonopin Condition: stable Instructions: refer to practice specific booklet Discharge to: home   Follow-up Information    Melody Suzan NailerN Shambley, CNM Follow up.   Specialties:  Obstetrics and Gynecology, Radiology Why:  2 weeks to f/u anxiety 6 weeks postpartum visit Contact information: [address] [place] [phone]        [facility] Follow up.   Why:  2-3 weeks to establish care          Newborn Data: Live born female   Birth Weight: 8 lb 5.7 oz (3790 g) APGAR: 6, 8  Home with mother.  Hildred Lasernika Kyan Giannone 09/12/2016, 11:10 AM

## 2016-09-13 ENCOUNTER — Telehealth: Payer: Self-pay

## 2016-09-13 NOTE — Telephone Encounter (Signed)
Pt was given 14 days supply on 09/01/16 Klonopin 0.5 mg - 1/2 pill prn. She was admitted for delivery in the interim .  I reviewed her records. Lactation Specialist has allowed only PRN use of the Klonopin. She should have enough pills now.   Will not refill at this time. They d/c Vistaril and Celexa as well at time of d/c from hospital.

## 2016-09-13 NOTE — Telephone Encounter (Signed)
pt called she had the baby last week wanted to know if you would send in klonopin.  she states that the doctor told her it was ok to take.

## 2016-09-14 ENCOUNTER — Telehealth: Payer: Self-pay

## 2016-09-14 NOTE — Telephone Encounter (Signed)
[place]  pt states she has her last one today.  pt states she was told to take 1/2 in am and 1/2 in pm.  of the klonopin.  pt states she still takes the celexa but not buspar.

## 2016-09-15 ENCOUNTER — Other Ambulatory Visit: Payer: Self-pay | Admitting: Psychiatry

## 2016-09-15 ENCOUNTER — Telehealth: Payer: Self-pay

## 2016-09-15 MED ORDER — PAROXETINE HCL 10 MG PO TABS: 10.0000 mg | ORAL_TABLET | Freq: Every day | ORAL | 1 refills | Status: DC

## 2016-09-15 MED ORDER — LORAZEPAM 0.5 MG PO TABS: 0.5000 mg | ORAL_TABLET | Freq: Every day | ORAL | 0 refills | Status: DC

## 2016-09-15 NOTE — Telephone Encounter (Signed)
I will d/c Klonopin and Celexa. I will start her on paxil 10mg  #15  And Lorazepam 0.25mg  po qhs prn #7 dispensed for 2 weeks.  Please advise pt to make appt in 2 weeks. No refills w/o pt being seen in office.

## 2016-09-15 NOTE — Telephone Encounter (Signed)
spoke with patient was told to stop klonopin and clexa.  dr. Garnetta Buddyfaheem sent in paxil and we are going to fax over order for ativan .

## 2016-09-15 NOTE — Telephone Encounter (Signed)
faxed and confirmed rx for ativan .5mg  #14 take 1 table by mouth at bedtime id # [id number] order # [id number]

## 2016-09-15 NOTE — Telephone Encounter (Signed)
I would not prescribe her Klonopin as she is breast feeding. She was not given in hospital

## 2016-09-15 NOTE — Telephone Encounter (Signed)
spoke with patient was told to stop klonopin and clexa.  dr. faheem sent in paxil and we are going to fax over order for ativan . 

## 2016-09-15 NOTE — Telephone Encounter (Signed)
pt was called and given information pt ask what should she do.  she needed something for anxiety.  pt was advised to make a sooner appt.

## 2016-09-15 NOTE — Telephone Encounter (Signed)
is there anything else besides celexa that she can take.  she states that it does not work

## 2016-09-22 ENCOUNTER — Encounter: Payer: Self-pay | Admitting: Psychiatry

## 2016-09-22 ENCOUNTER — Telehealth: Payer: Self-pay

## 2016-09-22 ENCOUNTER — Ambulatory Visit (INDEPENDENT_AMBULATORY_CARE_PROVIDER_SITE_OTHER): Payer: 59 | Admitting: Psychiatry

## 2016-09-22 VITALS — BP 94/67 | HR 125 | Temp 98.6°F | Wt 142.6 lb

## 2016-09-22 DIAGNOSIS — F1320 Sedative, hypnotic or anxiolytic dependence, uncomplicated: Secondary | ICD-10-CM

## 2016-09-22 DIAGNOSIS — F410 Panic disorder [episodic paroxysmal anxiety] without agoraphobia: Secondary | ICD-10-CM

## 2016-09-22 MED ORDER — OLANZAPINE 2.5 MG PO TABS: 1.2500 mg | ORAL_TABLET | Freq: Every day | ORAL | 2 refills | Status: DC

## 2016-09-22 MED ORDER — PAROXETINE HCL 10 MG PO TABS: 10.0000 mg | ORAL_TABLET | Freq: Two times a day (BID) | ORAL | 3 refills | Status: DC

## 2016-09-22 NOTE — Telephone Encounter (Signed)
pt was seen tolday.  she states you stated her on zyprexa she states that she talked with the lactation nurse and she doesn't recommend this medication.  states not good fo the baby

## 2016-09-22 NOTE — Progress Notes (Signed)
BH MD/PA/NP OP Progress Note  09/22/2016 11:22 AM Katelyn Whitehead  MRN:  [id number]  Subjective:   Patient is a 26 year old married female who presented for the follow-up appointment. She delivered on Feb 6th  and reported that it was a normal delivery. Patient appeared anxious stated that she is unable to eat and is losing weight. She reported that she was given a prescription of lorazepam after her delivery and she has been taking the medication. She remained focus on getting a prescription of benzodiazepines at this time. She stated that she has started taking the Paxil and it has been helping her. Her family and her husband are helping her with taking care of the baby. Patient does not appear to be concerned about her baby as she reported that she is well taken care of. She currently denied having any suicidal homicidal ideations or plans. She reported that she just feels anxious and wants to have a prescription of Klonopin or lorazepam. We discussed about stopping her benzodiazepines as they are going to be affecting her negatively while she is breast-feeding. She is reported that she will stop breast-feeding. She is also not eating well at this time. Advised patient that she will not be prescribed any more benzodiazepines at this time. She became upset. She does not agree with the plan. Advised patient that she will be given olanzapine at a low dose to help with her appetite and her mood stabilization. She will also be given paroxetine for her anxiety symptoms. She reported that her grandmother has also responded well to paroxetine in the past.       Chief Complaint:  Chief Complaint    Follow-up; Medication Reaction     Visit Diagnosis:     ICD-9-CM ICD-10-CM   1. Panic disorder 300.01 F41.0   2. Sedative dependence (HCC) 304.10 F13.20     Past Medical History:  Past Medical History:  Diagnosis Date  . ADHD (attention deficit hyperactivity disorder)   . Anxiety   . Asthma     childhood  . Depression   . Mild mitral and aortic regurgitation    a. 04/2013 Echo: EF 50-55%, mild AI, mild MR, no MVP.  Marland Kitchen Palpitations    a. 2012 Holter: sinus tach, pvc's.  . Panic disorder     Past Surgical History:  Procedure Laterality Date  . WISDOM TOOTH EXTRACTION     Family History:  Family History  Problem Relation Age of Onset  . Depression Mother   . Drug abuse Father   . ADD / ADHD Brother   . Depression Maternal Aunt   . Dementia Maternal Grandmother   . Depression Maternal Grandmother    Social History:  Social History   Social History  . Marital status: Married    Spouse name: N/A  . Number of children: N/A  . Years of education: N/A   Social History Main Topics  . Smoking status: Never Smoker  . Smokeless tobacco: Never Used  . Alcohol use No  . Drug use: No  . Sexual activity: Yes    Birth control/ protection: None   Other Topics Concern  . None   Social History Narrative  . None   Additional History:  Currently married and lives with her husband and has good relationship with him.  Assessment:   Musculoskeletal: Strength & Muscle Tone: within normal limits Gait & Station: normal Patient leans: N/A  Psychiatric Specialty Exam: Medication Refill   Anxiety     Depression  Past medical history includes anxiety.     Review of Systems  Psychiatric/Behavioral: Positive for depression.    Blood pressure 94/67, pulse (!) 125, temperature 98.6 F (37 C), temperature source Oral, weight 142 lb 9.6 oz (64.7 kg), last menstrual period 12/09/2015, unknown if currently breastfeeding.Body mass index is 26.08 kg/m.  General Appearance: Casual and Fairly Groomed  Eye Contact:  Fair  Speech:  Clear and Coherent  Volume:  Normal  Mood:  Anxious  Affect:  Congruent and Full Range  Thought Process:  Coherent  Orientation:  Full (Time, Place, and Person)  Thought Content:  WDL  Suicidal Thoughts:  No  Homicidal Thoughts:  No   Memory:  Immediate;   Fair  Judgement:  Fair  Insight:  Fair  Psychomotor Activity:  Normal  Concentration:  Fair  Recall:  FiservFair  Fund of Knowledge: Fair  Language: Fair  Akathisia:  No  Handed:  Right  AIMS (if indicated):    Assets:  Communication Skills Desire for Improvement Physical Health  ADL's:  Intact  Cognition: WNL  Sleep:  7   Is the patient at risk to self?  No. Has the patient been a risk to self in the past 6 months?  No. Has the patient been a risk to self within the distant past?  No. Is the patient a risk to others?  No. Has the patient been a risk to others in the past 6 months?  No. Has the patient been a risk to others within the distant past?  No.  Current Medications: Current Outpatient Prescriptions  Medication Sig Dispense Refill  . docusate sodium (COLACE) 100 MG capsule Take 1 capsule (100 mg total) by mouth 2 (two) times daily as needed for mild constipation. 30 capsule 2  . Iron-FA-B Cmp-C-Biot-Probiotic (FUSION PLUS) CAPS Take 1 capsule by mouth daily. 60 capsule 1  . PARoxetine (PAXIL) 10 MG tablet Take 1 tablet (10 mg total) by mouth 2 (two) times daily. 30 tablet 3  . Prenatal Vit-Fe Fumarate-FA (PRENATAL MULTIVITAMIN) TABS tablet Take 1 tablet by mouth daily at 12 noon. 60 tablet 3  . Vitamin D, Ergocalciferol, (DRISDOL) 50000 units CAPS capsule Take 1 capsule (50,000 Units total) by mouth every 7 (seven) days. 30 capsule 1  . OLANZapine (ZYPREXA) 2.5 MG tablet Take 0.5 tablets (1.25 mg total) by mouth at bedtime. 15 tablet 2   Current Facility-Administered Medications  Medication Dose Route Frequency Provider Last Rate Last Dose  . pantoprazole (PROTONIX) injection 40 mg  40 mg Intravenous Q24H Melody Suzan NailerN Shambley, CNM        Medical Decision Making:  Established Problem, Stable/Improving (1) and Review of Psycho-Social Stressors (1)  Treatment Plan Summary:Medication management   Discussed with patient what the medications treatment  risks benefits and alternatives.  Will titrate the dose of Paxil 10 mg in the morning and at 4 PM. She was given 15 day supply with 2 refills.  Also be started on olanzapine 1.25 mg at bedtime to help with the mood stabilization and her appetite.  She will not be given any more benzodiazepines as it is affecting her breast-feeding and the baby.  Advised  to follow with her OB and her lactation  specialist and she agreed with the plan  She will follow in 2 weeks or earlier depending on her symptoms.   This note was generated in part or whole with voice recognition software. Voice regonition is usually quite accurate but there are transcription errors that can  and very often do occur. I apologize for any typographical errors that were not detected and corrected.    Brandy Hale, MD  09/22/2016, 11:22 AM

## 2016-09-23 ENCOUNTER — Telehealth: Payer: Self-pay | Admitting: Obstetrics and Gynecology

## 2016-09-23 NOTE — Telephone Encounter (Signed)
left message to call office back.  

## 2016-09-23 NOTE — Telephone Encounter (Signed)
pt called today she was cry.  pt states she needs something she has anxiety so bad she cant function like this.

## 2016-09-23 NOTE — Telephone Encounter (Signed)
pls advise

## 2016-09-23 NOTE — Telephone Encounter (Signed)
Pt called and baby has thrush and she needs a cream called in for her to be treated

## 2016-09-23 NOTE — Telephone Encounter (Signed)
pt called and pt was told that per dr. Garnetta Buddyfaheem she needed to go to er she is not going to give any medication.

## 2016-09-24 ENCOUNTER — Ambulatory Visit (INDEPENDENT_AMBULATORY_CARE_PROVIDER_SITE_OTHER): Payer: 59 | Admitting: Obstetrics and Gynecology

## 2016-09-24 ENCOUNTER — Encounter: Payer: Self-pay | Admitting: Obstetrics and Gynecology

## 2016-09-24 VITALS — BP 90/58 | Ht 62.0 in | Wt 140.3 lb

## 2016-09-24 DIAGNOSIS — L0232 Furuncle of buttock: Secondary | ICD-10-CM

## 2016-09-24 DIAGNOSIS — B370 Candidal stomatitis: Secondary | ICD-10-CM

## 2016-09-24 DIAGNOSIS — F53 Postpartum depression: Secondary | ICD-10-CM

## 2016-09-24 DIAGNOSIS — O99345 Other mental disorders complicating the puerperium: Principal | ICD-10-CM

## 2016-09-24 DIAGNOSIS — R21 Rash and other nonspecific skin eruption: Secondary | ICD-10-CM

## 2016-09-24 MED ORDER — FLUCONAZOLE 100 MG PO TABS: 100.0000 mg | ORAL_TABLET | Freq: Every day | ORAL | 1 refills | Status: DC

## 2016-09-24 MED ORDER — LORAZEPAM 1 MG PO TABS: 1.0000 mg | ORAL_TABLET | Freq: Three times a day (TID) | ORAL | 0 refills | Status: DC

## 2016-09-24 MED ORDER — ESCITALOPRAM OXALATE 10 MG PO TABS: 10.0000 mg | ORAL_TABLET | Freq: Every day | ORAL | 6 refills | Status: DC

## 2016-09-24 MED ORDER — MUPIROCIN 2 % EX OINT: 1.0000 "application " | TOPICAL_OINTMENT | Freq: Three times a day (TID) | CUTANEOUS | 0 refills | Status: DC

## 2016-09-24 NOTE — Progress Notes (Signed)
Subjective:     Patient ID: Collier SalinaKatie O Portugal, female   DOB: Aug 16, 1990, 26 y.o.   MRN: [id number]  HPI Now 2 weeks postpartum, complains of rash under breast and depression (feels like current psych meds aren't working) also has new boils on lower abdomen and buttock. [facility] 09/24/2016 05/12/2016  Decreased Interest 3 3  Down, Depressed, Hopeless 3 3  PHQ - 2 Score 6 6  Altered sleeping 3 2  Tired, decreased energy 3 0  Change in appetite 3 1  Feeling bad or failure about yourself  3 2  Trouble concentrating 3 3  Moving slowly or fidgety/restless 2 2  Suicidal thoughts 0 0  PHQ-9 Score 23 16  reports psychiatrist changed meds earlier this week. Feels worse and desires to go back on ativan.  States infant has thrush and may need to treat her, nipples are red and sore, otherwise breast feeding is going well.   Reports rash around breast showed up a week ago and is red and itching. Not spreading. She has not used any medicine for it.   Review of Systems Negative except stated in HPI     Objective:   Physical Exam A&Ox4 Well groomed female in no distress, somewhat anxious (not new) Blood pressure (!) 90/58, height 5\' 2"  (1.575 m), weight 140 lb 4.8 oz (63.6 kg), last menstrual period 12/09/2015, currently breastfeeding. BP on second check 98/64 Breasts: lactating, no erythema or tenderness, nipples red and flaking, tinea rash noted under each breast-superficial. 2 boils noted with white drainage under the skin, red and warm to touch, one on left buttock 1x1cm, other on left upper edge of mons pubis 1x2cm.    Assessment:     Anxiety mixed with PPD Thrush on nipples Tinea under breast Infected boils (buttock and mons)    Plan:     Trial of Lexapro, restart ativan as needed Diflucan and Bactriban cream sent in and instructed on use. RTC 4 weeks for PPV  Jamaury Gumz, CNM

## 2016-09-26 NOTE — OB Triage Provider Note (Signed)
L&D OB Triage Note  Katelyn SalinaKatie O Whitehead is a 26 y.o. 872P1011 female at 9091w3d, EDD Estimated Date of Delivery: 09/14/16 who presented to triage for complaints of leaking small amount clear fluid and irregular contractions, but on exam perineum is dry and NTZ negative.Marland Kitchen.  She was evaluated by the nurses with no significant findings/findings significant for ruptured membranes or active labor. Vital signs stable. An NST was performed and has been reviewed by Me. She was reassured and discharged home.   NST INTERPRETATION: Indications: patient reassurance and rule out uterine contractions  Mode: External Baseline Rate (A): 135 bpm Variability: Moderate Accelerations: 15 x 15 Decelerations: None     Contraction Frequency (min): 4-4.5  Impression: reactive   Plan: NST performed was reviewed and was found to be reactive. She was discharged home with bleeding/labor precautions.  Continue routine prenatal care. Follow up with OB/GYN as previously scheduled.     Jaline Pincock Suzan NailerN Jorryn Casagrande, CNM

## 2016-09-29 ENCOUNTER — Telehealth: Payer: Self-pay | Admitting: Cardiovascular Disease

## 2016-09-29 ENCOUNTER — Ambulatory Visit: Payer: 59 | Admitting: Psychiatry

## 2016-09-29 NOTE — Telephone Encounter (Signed)
Pt c/o Shortness Of Breath: STAT if SOB developed within the last 24 hours or pt is noticeably SOB on the phone  1. Are you currently SOB (can you hear that pt is SOB on the phone)? Not at the moment, sometimes it is hard to take a deep breath  2. How long have you been experiencing SOB? Couple days  3. Are you SOB when sitting or when up moving around?  both  4. Are you currently experiencing any other symptoms? HR is in the 60s  When she lays down, or lays back

## 2016-09-29 NOTE — Telephone Encounter (Signed)
Spoke with patient and reviewed Eula ListenRyan Dunn PA's recommendations with her. She denies any chest pain or swelling at this time and just wanted to make sure that the low heart rate of 60 was not too low. She was very concerned because it has never been this low. Reassured her that this is still considered within normal range and that some changes are expected after delivery. Reviewed all signs and symptoms to monitor for and instructions to go to ED if she develops any of them. She verbalized understanding of our conversation and had no further questions at this time.

## 2016-09-29 NOTE — Telephone Encounter (Signed)
Please patient know that heart rate 60-70 bpm is normal. When you stand up your heart rate should increase some however it appears she may be slightly dehydrated if she is becoming tachycardic into the 120-140 beat per minute range. Just B complications after pregnancy including blood clots and postpartum cardiomyopathy. If patient is concerned and/or has symptoms of these to include increased shortness of breath and lower extremity swelling, erythema, tenderness, warmth she should get this evaluated in the ER. Patient has known anxiety and this is likely playing a significant role in her phone call today. Would not add any further medications at this time unless objective evidence indicates to do so.

## 2016-09-29 NOTE — Telephone Encounter (Signed)
Patient states that when she lays down her heart rate is 60-70's and when she gets up it goes to 120-140's. She wanted to know if the 60's was OK because her heart rate is never that low. Let her know that normal heart rate can range from 60 to low 100's for some people. She also wanted to know if she had a blood clot. She denied any chest pain, leg pain, or redness. She said that she looked up on the internet blood clot and cardiomyopathy and she is now concerned since her heart rate is low that these could be reasons for this. Let her know again that normal heart rates can be in the 60's and she just had the baby which can also cause your body to go through many changes. Instructed her to go to local ED if she develops any chest pain, leg pain, or any other symptoms concerning. Let her know I would forward the note to Eula Listenyan Dunn PA for his review. She was appreciative for the call and had no further questions at this time.

## 2016-10-04 ENCOUNTER — Encounter: Payer: Self-pay | Admitting: Physician Assistant

## 2016-10-04 ENCOUNTER — Ambulatory Visit: Payer: Self-pay | Admitting: Physician Assistant

## 2016-10-04 VITALS — BP 90/60 | HR 100 | Temp 97.8°F

## 2016-10-04 DIAGNOSIS — J0100 Acute maxillary sinusitis, unspecified: Secondary | ICD-10-CM

## 2016-10-04 DIAGNOSIS — R319 Hematuria, unspecified: Secondary | ICD-10-CM

## 2016-10-04 DIAGNOSIS — R300 Dysuria: Secondary | ICD-10-CM

## 2016-10-04 DIAGNOSIS — N390 Urinary tract infection, site not specified: Secondary | ICD-10-CM

## 2016-10-04 LAB — POCT URINALYSIS DIPSTICK
Bilirubin, UA: NEGATIVE
Glucose, UA: NEGATIVE
Ketones, UA: NEGATIVE
NITRITE UA: POSITIVE
PH UA: 6
Spec Grav, UA: 1.02
UROBILINOGEN UA: 0.2

## 2016-10-04 MED ORDER — CEPHALEXIN 500 MG PO CAPS: 500.0000 mg | ORAL_CAPSULE | Freq: Three times a day (TID) | ORAL | 0 refills | Status: DC

## 2016-10-04 NOTE — Progress Notes (Signed)
S: C/o runny nose and congestion for 3 days, no fever, chills, cp/sob, v/d; mucus is green and thick, cough is sporadic, c/o of facial and dental pain. Also burning with urination and still having pain in perineal area, + breast feeding  Using otc meds:   O: PE: vitals wnl, nad, perrl eomi, normocephalic, tms dull, nasal mucosa red and swollen, throat injected, neck supple no lymph, lungs c t a, cv rrr, neuro intact, ua 1+ leuks, +nitrites, 2+ blood  A:  Acute sinusitis, uti   P: drink fluids, continue regular meds , use otc meds of choice, return if not improving in 5 days, return earlier if worsening , keflex 500mg  tid, f/u with gyn, call for appt to recheck her urine and discuss perineal pain

## 2016-10-05 ENCOUNTER — Ambulatory Visit: Payer: Self-pay | Admitting: Physician Assistant

## 2016-10-07 ENCOUNTER — Encounter: Payer: Self-pay | Admitting: Obstetrics and Gynecology

## 2016-10-12 ENCOUNTER — Encounter: Payer: Self-pay | Admitting: Obstetrics and Gynecology

## 2016-10-12 ENCOUNTER — Ambulatory Visit (INDEPENDENT_AMBULATORY_CARE_PROVIDER_SITE_OTHER): Payer: 59 | Admitting: Obstetrics and Gynecology

## 2016-10-12 VITALS — BP 111/76 | HR 111 | Wt 136.3 lb

## 2016-10-12 DIAGNOSIS — F53 Postpartum depression: Secondary | ICD-10-CM

## 2016-10-12 DIAGNOSIS — B379 Candidiasis, unspecified: Secondary | ICD-10-CM

## 2016-10-12 DIAGNOSIS — N3001 Acute cystitis with hematuria: Secondary | ICD-10-CM

## 2016-10-12 DIAGNOSIS — O9081 Anemia of the puerperium: Secondary | ICD-10-CM | POA: Diagnosis not present

## 2016-10-12 DIAGNOSIS — O99345 Other mental disorders complicating the puerperium: Secondary | ICD-10-CM

## 2016-10-12 LAB — POCT URINALYSIS DIPSTICK
BILIRUBIN UA: NEGATIVE
GLUCOSE UA: NEGATIVE
Ketones, UA: NEGATIVE
Nitrite, UA: POSITIVE
RBC UA: NEGATIVE
SPEC GRAV UA: 1.01
Urobilinogen, UA: 0.2
pH, UA: 6.5

## 2016-10-12 MED ORDER — NITROFURANTOIN MONOHYD MACRO 100 MG PO CAPS: 100.0000 mg | ORAL_CAPSULE | Freq: Two times a day (BID) | ORAL | 1 refills | Status: DC

## 2016-10-12 MED ORDER — FLUOXETINE HCL 20 MG PO CAPS: 20.0000 mg | ORAL_CAPSULE | Freq: Every day | ORAL | 3 refills | Status: DC

## 2016-10-12 MED ORDER — LORAZEPAM 1 MG PO TABS: 1.0000 mg | ORAL_TABLET | Freq: Three times a day (TID) | ORAL | 0 refills | Status: DC

## 2016-10-12 MED ORDER — FLUCONAZOLE 150 MG PO TABS: 150.0000 mg | ORAL_TABLET | Freq: Once | ORAL | 3 refills | Status: AC

## 2016-10-12 NOTE — Progress Notes (Signed)
   Subjective:     Katelyn Whitehead is a 26 y.o. female who presents for a postpartum visit. She is 6 weeks postpartum following a spontaneous vaginal delivery. I have fully reviewed the prenatal and intrapartum course. The delivery was at 39 gestational weeks. Outcome: spontaneous vaginal delivery. Anesthesia: epidural. Postpartum course has been complicated by anxiety and depression with UTI. Baby's course has been uncomplicated. Baby is feeding by breast. Bleeding moderate lochia. Bowel function is normal. Bladder function is abnormal: just finshed antibiotics for UTI and still symptomatic. Patient is not sexually active. Contraception method is abstinence. Postpartum depression screening: positive.  The following portions of the patient's history were reviewed and updated as appropriate: allergies, current medications, past family history, past medical history, past social history, past surgical history and problem list.  Review of Systems Pertinent items noted in HPI and remainder of comprehensive ROS otherwise negative.   [facility] 10/12/2016 09/24/2016 05/12/2016  Decreased Interest 0 3 3  Down, Depressed, Hopeless 1 3 3   PHQ - 2 Score 1 6 6   Altered sleeping 1 3 2   Tired, decreased energy 1 3 0  Change in appetite 2 3 1   Feeling bad or failure about yourself  0 3 2  Trouble concentrating 1 3 3   Moving slowly or fidgety/restless 2 2 2   Suicidal thoughts 0 0 0  PHQ-9 Score 8 23 16     Objective:    BP 111/76   Pulse (!) 111   Wt 136 lb 4.8 oz (61.8 kg)   Breastfeeding? Yes   BMI 24.93 kg/m   General:  alert, cooperative and appears stated age   Breasts:  inspection negative, no nipple discharge or bleeding, no masses or nodularity palpable  Lungs: clear to auscultation bilaterally  Heart:  regular rate and rhythm, S1, S2 normal, no murmur, click, rub or gallop  Abdomen: soft, non-tender; bowel sounds normal; no masses,  no organomegaly   Vulva:  positive for  erythema bilaterally  Vagina: normal vagina  Cervix:  multiparous appearance  Corpus: normal size, contour, position, consistency, mobility, non-tender  Adnexa:  no mass, fullness, tenderness  Rectal Exam: Not performed.        Assessment:     6 weeks postpartum exam. Pap smear not done at today's visit.  UTI-will treat Vaginal yeast infection Anxiety under good control with ativan but wants to switch lexapro(makes her dizzy)  Plan:    1. Contraception: abstinence until IUD insertion 2. Rx sent for macrobid and diflucan  3. Follow up in: 3 months or as needed.   4. Switched to prozac 20mg  daily and refilled ativan.  Glynis Hunsucker Randall BendShambley, CNM

## 2016-10-12 NOTE — Patient Instructions (Signed)
  Place postpartum visit patient instructions here.  

## 2016-10-13 ENCOUNTER — Encounter: Payer: Self-pay | Admitting: Obstetrics and Gynecology

## 2016-10-13 LAB — CBC
Hematocrit: 39.1 % (ref 34.0–46.6)
Hemoglobin: 12.4 g/dL (ref 11.1–15.9)
MCH: 25.3 pg — ABNORMAL LOW (ref 26.6–33.0)
MCHC: 31.7 g/dL (ref 31.5–35.7)
MCV: 80 fL (ref 79–97)
PLATELETS: 330 10*3/uL (ref 150–379)
RBC: 4.91 x10E6/uL (ref 3.77–5.28)
RDW: 18.1 % — AB (ref 12.3–15.4)
WBC: 7.5 10*3/uL (ref 3.4–10.8)

## 2016-10-13 LAB — VITAMIN D 25 HYDROXY (VIT D DEFICIENCY, FRACTURES): VIT D 25 HYDROXY: 59.1 ng/mL (ref 30.0–100.0)

## 2016-10-13 LAB — FERRITIN: FERRITIN: 38 ng/mL (ref 15–150)

## 2016-10-13 LAB — B12 AND FOLATE PANEL
Folate: >20 ng/mL (ref >3.0)
Vitamin B-12: 685 pg/mL (ref 232–1245)

## 2016-10-13 LAB — MAGNESIUM: Magnesium: 2.1 mg/dL (ref 1.6–2.3)

## 2016-10-14 ENCOUNTER — Ambulatory Visit (INDEPENDENT_AMBULATORY_CARE_PROVIDER_SITE_OTHER): Payer: 59 | Admitting: Obstetrics and Gynecology

## 2016-10-14 ENCOUNTER — Encounter: Payer: Self-pay | Admitting: Obstetrics and Gynecology

## 2016-10-14 VITALS — BP 93/67 | HR 92 | Ht 62.0 in | Wt 136.4 lb

## 2016-10-14 DIAGNOSIS — Z30430 Encounter for insertion of intrauterine contraceptive device: Secondary | ICD-10-CM

## 2016-10-14 LAB — URINE CULTURE

## 2016-10-14 MED ORDER — LEVONORGESTREL 20 MCG/24HR IU IUD: INTRAUTERINE_SYSTEM | Freq: Once | INTRAUTERINE | Status: DC

## 2016-10-14 NOTE — Progress Notes (Signed)
Katelyn Whitehead is a 26 y.o. year old 982P1011 Caucasian female who presents for placement of a Mirena IUD.  No LMP recorded. BP 93/67   Pulse 92   Ht 5\' 2"  (1.575 m)   Wt 136 lb 6.4 oz (61.9 kg)   BMI 24.95 kg/m  Last sexual intercourse was before delivery.  The risks and benefits of the method and placement have been thouroughly reviewed with the patient and all questions were answered.  Specifically the patient is aware of failure rate of 08/998, expulsion of the IUD and of possible perforation.  The patient is aware of irregular bleeding due to the method and understands the incidence of irregular bleeding diminishes with time.  Signed copy of informed consent in chart.   Time out was performed.  A graves speculum was placed in the vagina.  The cervix was visualized, prepped using Betadine, and grasped with a single tooth tenaculum. The uterus was found to be neutral and it sounded to 8 cm.  Mirena IUD placed per manufacturer's recommendations.   The strings were trimmed to 3 cm.  The patient was given post procedure instructions, including signs and symptoms of infection and to check for the strings after each menses or each month, and refraining from intercourse or anything in the vagina for 3 days.  She was given a Mirena care card with date Mirena placed, and date Mirena to be removed.    Melody Suzan NailerN Shambley, CNM

## 2016-10-14 NOTE — Patient Instructions (Signed)

## 2016-10-19 ENCOUNTER — Encounter: Payer: Self-pay | Admitting: Obstetrics and Gynecology

## 2016-10-20 ENCOUNTER — Telehealth: Payer: Self-pay | Admitting: Psychiatry

## 2016-10-25 ENCOUNTER — Ambulatory Visit: Payer: 59 | Admitting: Psychiatry

## 2016-10-26 ENCOUNTER — Telehealth: Payer: Self-pay | Admitting: Obstetrics and Gynecology

## 2016-10-26 NOTE — Telephone Encounter (Signed)
Patient called because she needs a refill on her Ativan.

## 2016-10-27 ENCOUNTER — Ambulatory Visit (INDEPENDENT_AMBULATORY_CARE_PROVIDER_SITE_OTHER): Payer: 59 | Admitting: Obstetrics and Gynecology

## 2016-10-27 ENCOUNTER — Encounter: Payer: Self-pay | Admitting: Obstetrics and Gynecology

## 2016-10-27 DIAGNOSIS — Z975 Presence of (intrauterine) contraceptive device: Secondary | ICD-10-CM

## 2016-10-27 MED ORDER — LORAZEPAM 1 MG PO TABS: 1.0000 mg | ORAL_TABLET | Freq: Three times a day (TID) | ORAL | 0 refills | Status: DC

## 2016-10-27 MED ORDER — LEVONORGESTREL 20 MCG/24HR IU IUD: 1.0000 | INTRAUTERINE_SYSTEM | Freq: Once | INTRAUTERINE | 0 refills | Status: DC

## 2016-10-27 NOTE — Telephone Encounter (Signed)
pls advise

## 2016-10-27 NOTE — Progress Notes (Signed)
   Subjective:     Katelyn Whitehead is a 26 y.o. female who presents for a postpartum visit. She is 6 weeks postpartum following a spontaneous vaginal delivery. I have fully reviewed the prenatal and intrapartum course. The delivery was at 39 gestational weeks. Outcome: vacuum, low. Anesthesia: epidural. Postpartum course has been complicated by anxiety and depression. Baby's course has been uncomplicated. Baby is feeding by breast. Bleeding spotting on/off since mirena placed. Bowel function is normal. Bladder function is normal. Patient is not sexually active. Contraception method is abstinence and IUD. Postpartum depression screening: negative.  The following portions of the patient's history were reviewed and updated as appropriate: allergies, current medications, past family history, past medical history, past social history, past surgical history and problem list.  Review of Systems A comprehensive review of systems was negative.   Objective:    BP 92/64   Pulse 94   Ht 5\' 2"  (1.575 m)   Wt 132 lb 12.8 oz (60.2 kg)   BMI 24.29 kg/m   General:  alert, cooperative and appears stated age   Breasts:  inspection negative, no nipple discharge or bleeding, no masses or nodularity palpable  Lungs: clear to auscultation bilaterally  Heart:  regular rate and rhythm, S1, S2 normal, no murmur, click, rub or gallop  Abdomen: soft, non-tender; bowel sounds normal; no masses,  no organomegaly   Vulva:  normal  Vagina: normal vagina, no discharge, exudate, lesion, or erythema  Cervix:  multiparous appearance and IUD string noted inside cervix  Corpus: normal size, contour, position, consistency, mobility, non-tender  Adnexa:  no mass, fullness, tenderness  Rectal Exam: Not performed.        Assessment:     6 weeks postpartum exam. Pap smear not done at today's visit.  Anxiety under good control with meds IUD check  Plan:    1. Contraception: IUD 2. To establish care with different  psychiatrist for medication management, refill given till then 3. Follow up in: 4 months for AE or as needed.

## 2016-10-27 NOTE — Patient Instructions (Signed)
  Place postpartum visit patient instructions here.  

## 2016-10-29 ENCOUNTER — Ambulatory Visit: Payer: Self-pay | Admitting: Physician Assistant

## 2016-10-29 ENCOUNTER — Encounter: Payer: Self-pay | Admitting: Physician Assistant

## 2016-10-29 VITALS — BP 90/70 | HR 99 | Temp 98.3°F

## 2016-10-29 DIAGNOSIS — R300 Dysuria: Secondary | ICD-10-CM

## 2016-10-29 DIAGNOSIS — J069 Acute upper respiratory infection, unspecified: Secondary | ICD-10-CM

## 2016-10-29 LAB — POCT URINALYSIS DIPSTICK
Bilirubin, UA: NEGATIVE
GLUCOSE UA: NEGATIVE
Ketones, UA: NEGATIVE
Nitrite, UA: NEGATIVE
PH UA: 6 (ref 5.0–8.0)
SPEC GRAV UA: 1.02 (ref 1.030–1.035)
UROBILINOGEN UA: 0.2 (ref ?–2.0)

## 2016-10-29 MED ORDER — FLUOXETINE HCL 20 MG PO TABS: ORAL_TABLET | ORAL | 3 refills | Status: DC

## 2016-10-29 MED ORDER — AZITHROMYCIN 250 MG PO TABS: ORAL_TABLET | ORAL | 0 refills | Status: DC

## 2016-10-29 NOTE — Progress Notes (Signed)
S: C/o runny nose and congestion for 5 days, no fever, chills, cp/sob, v/d; mucus was green this am , had flu sx last week, got a little better but now cough is productive with green mucus, some dysuria, some numbness and tingling in her arms and legs, also ?if she can up her prozac to 40 mg qd, states she feels much better on it at 20mg  but isn't back to normal yet, no si/hi; also needs a pcp and would like a referral to another psychiatrist  Using otc meds:   O: PE: vitals wnl, nad, perrl eomi, normocephalic, tms dull, nasal mucosa red and swollen, throat injected, neck supple no lymph, lungs c t a, cv rrr, neuro intact  A:  Acute uri   P: drink fluids, continue regular meds , use otc meds of choice, return if not improving in 5 days, return earlier if worsening , zpack, increase prozac to 20mg  every other day, and 40mg  on opposite days, if still not normal increase to 40mg  qd, will refer to pcp and psychiatry

## 2016-11-02 NOTE — Progress Notes (Signed)
Contacted Dr. Maryruth BunKapur office spoke with New pt line(Jennifer) appointment scheduled May 29th @ 1:00 [phone] Phone # [phone]. LM on patient phone of appointment

## 2016-11-02 NOTE — Progress Notes (Signed)
Contacted Dr. Birdie SonsSonnenberg office spoke with Marchelle FolksAmanda appt scheduled for PCP on 11/03/2016 @ 2:30. LM on patient telephone of date and time Located [address]

## 2016-11-03 ENCOUNTER — Ambulatory Visit: Payer: 59 | Admitting: Psychiatry

## 2016-11-03 ENCOUNTER — Ambulatory Visit: Payer: Self-pay | Admitting: Family Medicine

## 2016-11-08 ENCOUNTER — Telehealth: Payer: Self-pay | Admitting: Obstetrics and Gynecology

## 2016-11-08 NOTE — Telephone Encounter (Signed)
LVM for patient concerning disability paperwork

## 2016-11-10 ENCOUNTER — Telehealth: Payer: Self-pay | Admitting: Obstetrics and Gynecology

## 2016-11-10 ENCOUNTER — Other Ambulatory Visit: Payer: Self-pay | Admitting: *Deleted

## 2016-11-10 MED ORDER — LORAZEPAM 1 MG PO TABS: 1.0000 mg | ORAL_TABLET | Freq: Three times a day (TID) | ORAL | 0 refills | Status: DC

## 2016-11-10 NOTE — Telephone Encounter (Signed)
Attempt to contact patient concerning disability paperwork - mailbox full - unable to leave VM

## 2016-11-10 NOTE — Telephone Encounter (Signed)
Patient is going to need a refill on ativan which she was going to talk with Melody about but she is now going to see Pattricia Boss. She does not have an appt with psychiatry until the end of May.Thanks

## 2016-11-11 ENCOUNTER — Ambulatory Visit: Payer: Self-pay | Admitting: Obstetrics and Gynecology

## 2016-11-11 NOTE — Telephone Encounter (Signed)
rx faxed to [facility]

## 2016-11-12 ENCOUNTER — Emergency Department: Payer: 59

## 2016-11-12 ENCOUNTER — Emergency Department
Admission: EM | Admit: 2016-11-12 | Discharge: 2016-11-12 | Disposition: A | Discharge status: Home / Self Care | Payer: 59 | Attending: Emergency Medicine | Admitting: Emergency Medicine

## 2016-11-12 ENCOUNTER — Ambulatory Visit (INDEPENDENT_AMBULATORY_CARE_PROVIDER_SITE_OTHER): Payer: 59 | Admitting: Family Medicine

## 2016-11-12 ENCOUNTER — Encounter: Payer: Self-pay | Admitting: Family Medicine

## 2016-11-12 ENCOUNTER — Ambulatory Visit: Payer: Self-pay | Admitting: Certified Nurse Midwife

## 2016-11-12 ENCOUNTER — Encounter: Payer: Self-pay | Admitting: Emergency Medicine

## 2016-11-12 VITALS — BP 90/68 | HR 44 | Temp 98.3°F | Wt 132.0 lb

## 2016-11-12 DIAGNOSIS — R8290 Unspecified abnormal findings in urine: Secondary | ICD-10-CM | POA: Diagnosis not present

## 2016-11-12 DIAGNOSIS — R200 Anesthesia of skin: Secondary | ICD-10-CM | POA: Diagnosis not present

## 2016-11-12 DIAGNOSIS — R42 Dizziness and giddiness: Secondary | ICD-10-CM | POA: Diagnosis not present

## 2016-11-12 DIAGNOSIS — N390 Urinary tract infection, site not specified: Secondary | ICD-10-CM | POA: Diagnosis not present

## 2016-11-12 DIAGNOSIS — M79621 Pain in right upper arm: Secondary | ICD-10-CM | POA: Diagnosis not present

## 2016-11-12 DIAGNOSIS — F419 Anxiety disorder, unspecified: Secondary | ICD-10-CM | POA: Diagnosis not present

## 2016-11-12 DIAGNOSIS — R531 Weakness: Secondary | ICD-10-CM | POA: Diagnosis not present

## 2016-11-12 DIAGNOSIS — M79602 Pain in left arm: Secondary | ICD-10-CM

## 2016-11-12 DIAGNOSIS — R509 Fever, unspecified: Secondary | ICD-10-CM | POA: Insufficient documentation

## 2016-11-12 DIAGNOSIS — R202 Paresthesia of skin: Secondary | ICD-10-CM | POA: Insufficient documentation

## 2016-11-12 DIAGNOSIS — M79601 Pain in right arm: Secondary | ICD-10-CM

## 2016-11-12 DIAGNOSIS — J45909 Unspecified asthma, uncomplicated: Secondary | ICD-10-CM | POA: Diagnosis not present

## 2016-11-12 DIAGNOSIS — M79622 Pain in left upper arm: Secondary | ICD-10-CM | POA: Diagnosis not present

## 2016-11-12 DIAGNOSIS — R55 Syncope and collapse: Secondary | ICD-10-CM

## 2016-11-12 DIAGNOSIS — R4589 Other symptoms and signs involving emotional state: Secondary | ICD-10-CM

## 2016-11-12 LAB — COMPREHENSIVE METABOLIC PANEL
ALBUMIN: 3.9 g/dL (ref 3.5–5.0)
ALK PHOS: 106 U/L (ref 38–126)
ALT: 13 U/L — ABNORMAL LOW (ref 14–54)
ANION GAP: 7 (ref 5–15)
AST: 17 U/L (ref 15–41)
BUN: 11 mg/dL (ref 6–20)
CALCIUM: 9.2 mg/dL (ref 8.9–10.3)
CO2: 30 mmol/L (ref 22–32)
Chloride: 103 mmol/L (ref 101–111)
Creatinine, Ser: 0.79 mg/dL (ref 0.44–1.00)
GFR calc Af Amer: >60 mL/min (ref >60)
GFR calc non Af Amer: >60 mL/min (ref >60)
GLUCOSE: 93 mg/dL (ref 65–99)
Potassium: 3.2 mmol/L — ABNORMAL LOW (ref 3.5–5.1)
SODIUM: 140 mmol/L (ref 135–145)
Total Bilirubin: 0.5 mg/dL (ref 0.3–1.2)
Total Protein: 7.6 g/dL (ref 6.5–8.1)

## 2016-11-12 LAB — TSH: TSH: 0.808 u[IU]/mL (ref 0.350–4.500)

## 2016-11-12 LAB — CBC
HCT: 37.9 % (ref 35.0–47.0)
HEMOGLOBIN: 12.3 g/dL (ref 12.0–16.0)
MCH: 25.7 pg — ABNORMAL LOW (ref 26.0–34.0)
MCHC: 32.3 g/dL (ref 32.0–36.0)
MCV: 79.6 fL — ABNORMAL LOW (ref 80.0–100.0)
Platelets: 351 10*3/uL (ref 150–440)
RBC: 4.77 MIL/uL (ref 3.80–5.20)
RDW: 18.9 % — ABNORMAL HIGH (ref 11.5–14.5)
WBC: 6.7 10*3/uL (ref 3.6–11.0)

## 2016-11-12 LAB — URINALYSIS, COMPLETE (UACMP) WITH MICROSCOPIC
BILIRUBIN URINE: NEGATIVE
GLUCOSE, UA: NEGATIVE mg/dL
Hgb urine dipstick: NEGATIVE
KETONES UR: NEGATIVE mg/dL
NITRITE: POSITIVE — AB
PH: 8 (ref 5.0–8.0)
Protein, ur: NEGATIVE mg/dL
Specific Gravity, Urine: 1.014 (ref 1.005–1.030)

## 2016-11-12 LAB — GLUCOSE, CAPILLARY: GLUCOSE-CAPILLARY: 90 mg/dL (ref 65–99)

## 2016-11-12 LAB — LIPASE, BLOOD: Lipase: 20 U/L (ref 11–51)

## 2016-11-12 LAB — PREGNANCY, URINE: Preg Test, Ur: NEGATIVE

## 2016-11-12 LAB — POCT URINALYSIS DIPSTICK
Bilirubin, UA: NEGATIVE
GLUCOSE UA: NEGATIVE
KETONES UA: NEGATIVE
Nitrite, UA: POSITIVE
SPEC GRAV UA: 1.015 (ref 1.030–1.035)
UROBILINOGEN UA: 0.2 (ref ?–2.0)
pH, UA: 7 (ref 5.0–8.0)

## 2016-11-12 LAB — MAGNESIUM: Magnesium: 1.9 mg/dL (ref 1.7–2.4)

## 2016-11-12 MED ORDER — AMOXICILLIN-POT CLAVULANATE 875-125 MG PO TABS
1.0000 | ORAL_TABLET | Freq: Once | ORAL | Status: AC
  Administered 2016-11-12: 1 via ORAL
  Filled 2016-11-12: qty 1

## 2016-11-12 MED ORDER — POTASSIUM CHLORIDE CRYS ER 20 MEQ PO TBCR
40.0000 meq | EXTENDED_RELEASE_TABLET | Freq: Once | ORAL | Status: AC
  Administered 2016-11-12: 40 meq via ORAL
  Filled 2016-11-12: qty 2

## 2016-11-12 MED ORDER — AMOXICILLIN-POT CLAVULANATE 875-125 MG PO TABS: 1.0000 | ORAL_TABLET | Freq: Two times a day (BID) | ORAL | 0 refills | Status: DC

## 2016-11-12 MED ORDER — SODIUM CHLORIDE 0.9 % IV BOLUS (SEPSIS)
1000.0000 mL | Freq: Once | INTRAVENOUS | Status: AC
  Administered 2016-11-12: 1000 mL via INTRAVENOUS

## 2016-11-12 NOTE — Progress Notes (Signed)
Subjective:  Patient ID: Katelyn Whitehead, female    DOB: 1991-07-24  Age: 26 y.o. MRN: [id number]  CC: Establish care  HPI Katelyn Whitehead is a 26 y.o. female presents to the clinic today to establish care. Issues/concerns are below.  Patient resents today to establish care. She has numerous complaints at this time.  Patient states that she has been feeling poorly for quite some time now. She states that she's had diffuse numbness and tingling for the past 2 months. Nondistended tingling is predominately in the arms and legs as well as the mouth and tongue. She reports associated weakness of lower extremities. No known inciting factor. She states that she just generally feels poor. She does not feel like herself. She has symptoms of presyncope as well. She feels as if she is going to pass out. This is been worse over the past 2 weeks and particularly of the past few days. She is s/p vaginal delivery on 2/7. In addition to her other reported symptoms she states that she's had bradycardia particularly in the evening. She reports decreased appetite. Decreased PO intake.   Patient states that she just generally feels poor. She feels like she needs to go to the hospital for further evaluation. Additionally, she's had a recent UTI. She continues to endorse foul-smelling urine. No urgency or frequency. No dysuria.  PMH, Surgical Hx, Family Hx, Social History reviewed and updated as below.  Past Medical History:  Diagnosis Date  . ADHD (attention deficit hyperactivity disorder)   . Anxiety   . Asthma    childhood  . Depression   . Mild mitral and aortic regurgitation    a. 04/2013 Echo: EF 50-55%, mild AI, mild MR, no MVP.  Marland Kitchen Palpitations    a. 2012 Holter: sinus tach, pvc's.  . Panic disorder    Past Surgical History:  Procedure Laterality Date  . WISDOM TOOTH EXTRACTION     Family History  Problem Relation Age of Onset  . Depression Mother   . Drug abuse Father   . ADD / ADHD Brother    . Depression Maternal Aunt   . Dementia Maternal Grandmother   . Depression Maternal Grandmother    Social History  Substance Use Topics  . Smoking status: Never Smoker  . Smokeless tobacco: Never Used  . Alcohol use No   Review of Systems  Constitutional: Positive for fatigue.  Neurological: Positive for dizziness, light-headedness and numbness.  Psychiatric/Behavioral: The patient is nervous/anxious.   All other systems reviewed and are negative.   Objective:   Today's Vitals: BP 90/68   Pulse (!) 44   Temp 98.3 F (36.8 C) (Oral)   Wt 132 lb (59.9 kg)   SpO2 98%   BMI 24.14 kg/m   Physical Exam  Constitutional: She is oriented to person, place, and time.  Appears anxious. No apparent distress.  HENT:  Dry mucous membranes. Oropharynx clear.   Eyes: Conjunctivae are normal.  Neck: Neck supple. No thyromegaly present.  Cardiovascular:  Tachycardic. No murmur. It is unclear to me why the pulse oximeter did not pick up her heart rate. She is not bradycardic.  Pulmonary/Chest: Effort normal. She has no wheezes. She has no rales.  Abdominal: Soft. She exhibits no distension. There is no tenderness. There is no rebound and no guarding.  Neurological: She is alert and oriented to person, place, and time.  Normal muscle strength. No apparent neurological deficits on brief neurological examination.  Skin: No rash noted.  Psychiatric:  Anxious.  Vitals reviewed.  EKG - Normal sinus rhythm at a rate of 74. Normal axis. Normal intervals. No discrete ST or T-wave changes. Normal EKG.  Assessment & Plan:   Problem List Items Addressed This Visit    Pre-syncope - Primary    New problem. EKG normal. Urine + today. Recent completed treatment with macrobid. Concern for urosepsis. Patient is very concerned and anxious. Sending to ER for urgent evaluation. Needs labs, IV fluids, ? Imaging.      Relevant Orders   EKG 12-Lead (Completed)   EKG 12-Lead (Completed)    Numbness and tingling    New problem. Unclear etiology and prognosis. Suspect anxiety. Sending to ER for urgent eval (given other symptoms as well as patient preference).       Other Visit Diagnoses    Urinary tract infection without hematuria, site unspecified       Relevant Orders   POCT urinalysis dipstick   Urine Culture     Follow-up: Pending eval at ER  Everlene Other DO [facility]

## 2016-11-12 NOTE — ED Notes (Signed)
Patient transported to MRI 

## 2016-11-12 NOTE — ED Provider Notes (Signed)
[facility]   ____________________________________________   First MD Initiated Contact with Patient 11/12/16 1536     (approximate)  I have reviewed the triage vital signs and the nursing notes.   HISTORY  Chief Complaint Dizziness    HPI Katelyn Whitehead is a 26 y.o. female spent 7 weeks postpartum, she had an uncomplicated vaginal delivery  Patient reports for about the last 7 weeks, she had been experiencing tingling sensation in her fingers and feet. Occasionally feels a burning feeling in her fingers and feet. She seen her obstetrician, was evaluated for this and reports that she's had testing without any clear cause. Also about 2 days ago she felt like her left eye was somewhat droopy, and her left arm felt more tingly and somewhat numb but this has resolved as well. She reports a coming and going feeling of tingling and occasionally some weakness or left arm that comes off and on for at least a month's time now.  No fevers or chills. No nausea or vomiting. Reports somewhat poor oral intake. She is been feeling lightheaded with standing for a couple weeks now. She is breast-feeding. Denies abdominal pain. No vaginal bleeding.  Patient also reports she has a history of anxiety, treated with Ativan and antidepressant.  The present time continues to have a feeling of tingling in her fingers and toes which never goes away, it's been present since delivery. No muscle weakness. No back pain. No neck pain. No headaches.   Past Medical History:  Diagnosis Date  . ADHD (attention deficit hyperactivity disorder)   . Anxiety   . Asthma    childhood  . Depression   . Mild mitral and aortic regurgitation    a. 04/2013 Echo: EF 50-55%, mild AI, mild MR, no MVP.  Marland Kitchen Palpitations    a. 2012 Holter: sinus tach, pvc's.  . Panic disorder     Patient Active Problem List   Diagnosis Date Noted  . Pre-syncope 11/12/2016  .  Numbness and tingling 11/12/2016  . Vitamin D deficiency 05/13/2016  . B12 deficiency 05/13/2016  . Mild mitral and aortic regurgitation   . Panic disorder   . ADD (attention deficit disorder) 12/04/2014  . Depression, major, recurrent, moderate (HCC) 12/04/2014  . Mitral valve prolapse 02/24/2011    Past Surgical History:  Procedure Laterality Date  . WISDOM TOOTH EXTRACTION      Prior to Admission medications   Medication Sig Start Date End Date Taking? Authorizing Provider  amoxicillin-clavulanate (AUGMENTIN) 875-125 MG tablet Take 1 tablet by mouth 2 (two) times daily. 11/12/16   Sharyn Creamer, MD  FLUoxetine (PROZAC) 20 MG tablet Take one or 2 pills qd 10/29/16   Faythe Ghee, PA-C  LORazepam (ATIVAN) 1 MG tablet Take 1 tablet (1 mg total) by mouth every 8 (eight) hours. 11/10/16   Melody Suzan Nailer, CNM  Prenatal Vit-Fe Fumarate-FA (PRENATAL MULTIVITAMIN) TABS tablet Take 1 tablet by mouth daily at 12 noon. 09/12/16   Hildred Laser, MD  Vitamin D, Ergocalciferol, (DRISDOL) 50000 units CAPS capsule Take 1 capsule (50,000 Units total) by mouth every 7 (seven) days. 05/13/16   Melody N Shambley, CNM    Allergies Influenza vaccines and Sulfa antibiotics  Family History  Problem Relation Age of Onset  . Depression Mother   . Drug abuse Father   . ADD / ADHD Brother   . Depression Maternal Aunt   . Dementia Maternal Grandmother   . Depression Maternal  Grandmother     Social History Social History  Substance Use Topics  . Smoking status: Never Smoker  . Smokeless tobacco: Never Used  . Alcohol use No    Review of Systems Constitutional: No fever/chills Eyes: No visual changes.She does report however her left eye was drooping a couple days ago. ENT: No sore throat. Cardiovascular: Denies chest pain. Respiratory: Denies shortness of breath. She did however have a cough and reported a fever with "flu" like symptoms a few weeks ago. Gastrointestinal: No abdominal pain.  No nausea,  no vomiting.  No diarrhea.  No constipation. Genitourinary: Negative for dysuria. Notes urine odor has been unusual, but no burning or pain that is very musty unusual odor has been treated with Keflex and Macrobid for urinary tract infections in the last month. Musculoskeletal: Negative for back pain. Skin: Negative for rash. Neurological: Negative for headaches. No trouble speaking.  10-point ROS otherwise negative.  ____________________________________________   PHYSICAL EXAM:  VITAL SIGNS: ED Triage Vitals  Enc Vitals Group     BP 11/12/16 1540 115/77     Pulse Rate 11/12/16 1540 69     Resp 11/12/16 1540 (!) 21     Temp 11/12/16 1540 98.5 F (36.9 C)     Temp Source 11/12/16 1540 Oral     SpO2 11/12/16 1540 99 %     Weight 11/12/16 1537 130 lb (59 kg)     Height 11/12/16 1537  (1.575 m)     Head Circumference --      Peak Flow --      Pain Score --      Pain Loc --      Pain Edu? --      Excl. in GC? --    Constitutional: Alert and oriented. Well appearing and in no acute distress. Eyes: Conjunctivae are normal. PERRL. EOMI. Head: Atraumatic.No facial droop noted. No proptosis. Nose: No congestion/rhinnorhea. Mouth/Throat: Mucous membranes are moist.  Oropharynx non-erythematous. Neck: No stridor.  No cervical tenderness or rigidity. Cardiovascular: Normal rate, regular rhythm. Grossly normal heart sounds.  Good peripheral circulation. Respiratory: Normal respiratory effort.  No retractions. Lungs CTAB. Gastrointestinal: Soft and nontender. No distention. Musculoskeletal: No lower extremity tenderness nor edema.   Neurologic:   NIH score equals 0, performed by me at bedside. The patient has no pronator drift. The patient has normal cranial nerve exam. Extraocular movements are normal. Visual fields are normal. Patient has 5 out of 5 strength in all extremities. There is no numbness or gross, acute sensory abnormality in the extremities bilaterally. No  speech disturbance. No dysarthria. No aphasia. No ataxia. Normal finger nose finger bilat. Patient speaking in full and clear sentences.  Skin:  Skin is warm, dry and intact. No rash noted. Psychiatric: Mood and affect are slightly anxious. Speech and behavior are normal.  ____________________________________________   LABS (all labs ordered are listed, but only abnormal results are displayed)  Labs Reviewed  CBC - Abnormal; Notable for the following:       Result Value   MCV 79.6 (*)    MCH 25.7 (*)    RDW 18.9 (*)    All other components within normal limits  COMPREHENSIVE METABOLIC PANEL - Abnormal; Notable for the following:    Potassium 3.2 (*)    ALT 13 (*)    All other components within normal limits  URINALYSIS, COMPLETE (UACMP) WITH MICROSCOPIC - Abnormal; Notable for the following:    Color, Urine YELLOW (*)  APPearance CLOUDY (*)    Nitrite POSITIVE (*)    Leukocytes, UA TRACE (*)    Bacteria, UA FEW (*)    Squamous Epithelial / LPF 0-5 (*)    All other components within normal limits  URINE CULTURE  GLUCOSE, CAPILLARY  LIPASE, BLOOD  TSH  MAGNESIUM  PREGNANCY, URINE   ____________________________________________  EKG  Reviewed and interpreted by me at 1542 Heart rate 75 QRS 90 QTc 440 Normal sinus rhythm, no evidence of ischemia, ectopy or prolongation of QT ____________________________________________  RADIOLOGY  Dg Chest 2 View  Result Date: 11/12/2016 CLINICAL DATA:  26 year old female with a history of numbness and tingling EXAM: CHEST  2 VIEW COMPARISON:  04/26/2015 FINDINGS: Cardiomediastinal silhouette within normal limits. No pneumothorax or pleural effusion.  No confluent airspace disease. No displaced fracture. IMPRESSION: No radiographic evidence of acute cardiopulmonary disease. Electronically Signed   By: Gilmer Mor D.O.   On: 11/12/2016 17:16   Mr Brain Wo Contrast  Result Date: 11/12/2016 CLINICAL DATA:  Healing on and off  paresthesias and left facial weakness for a few days. Evaluate for CVA or multiple sclerosis. EXAM: MRI HEAD WITHOUT CONTRAST TECHNIQUE: Multiplanar, multiecho pulse sequences of the brain and surrounding structures were obtained without intravenous contrast. COMPARISON:  Head CT 07/30/2016 FINDINGS: Brain: No acute or remote infarction, hemorrhage, hydrocephalus, extra-axial collection or mass lesion. Normal brain volume and white matter appearance. Vascular: Normal flow voids Skull and upper cervical spine: Negative Sinuses/Orbits: Negative IMPRESSION: Normal exam. Electronically Signed   By: Marnee Spring M.D.   On: 11/12/2016 17:09    ____________________________________________   PROCEDURES  Procedure(s) performed: None  Procedures  Critical Care performed: No  ____________________________________________   INITIAL IMPRESSION / ASSESSMENT AND PLAN / ED COURSE  Pertinent labs & imaging results that were available during my care of the patient were reviewed by me and considered in my medical decision making (see chart for details).  Patient presents for evaluation of paresthesias with some been evidently ongoing for about a month since delivery. She's had a fairly extensive outpatient evaluation with no clear etiology. Clinical examination is normal with regard to her neurologic and cardiac evaluation. She has no pulmonary symptoms. She does appear notably anxious, and reports she's been having significant anxiety for a large part of her life and is currently on treatment for that  Urinalysis is questionable for urinary tract infection, but appears a clean sample with nitrates and leukocytes and bacteria present. She's been treated with Keflex as well as Macrobid, I will reculture and based on previous sensitivity to Augmentin and her breast-feeding status we'll place her on Augmentin.  Do not believe the patient suffered from a TIA. No evidence of stroke or brain mass on  MRI.  ----------------------------------------- 6:48 PM on 11/12/2016 -----------------------------------------  Patient reports feeling improvement. Patient requesting to be discharged . She is hydrated now. Agreeable to plan for careful return precautions and follow-up with her primary.      ____________________________________________   FINAL CLINICAL IMPRESSION(S) / ED DIAGNOSES  Final diagnoses:  Paresthesia and pain of both upper extremities  Anxious appearance      NEW MEDICATIONS STARTED DURING THIS VISIT:  New Prescriptions   AMOXICILLIN-CLAVULANATE (AUGMENTIN) 875-125 MG TABLET    Take 1 tablet by mouth 2 (two) times daily.     Note:  This document was prepared using Dragon voice recognition software and may include unintentional dictation errors.     Sharyn Creamer, MD 11/12/16 [phone]

## 2016-11-12 NOTE — Assessment & Plan Note (Addendum)
New problem. EKG normal. Urine + today. Recent completed treatment with macrobid. Concern for urosepsis. Patient is very concerned and anxious. Sending to ER for urgent evaluation. Needs labs, IV fluids, ? Imaging.

## 2016-11-12 NOTE — Discharge Instructions (Addendum)
°  As we have discussed, please follow up with your primary care doctor as soon as possible regarding today?s Emergency Department (ED) visit and your headache symptoms.    Call your doctor or return to the ED if you have a worsening symptoms, a bad headache, sudden and severe headache, confusion, slurred speech, facial droop, weakness or numbness in any arm or leg, extreme fatigue, vision problems, or other symptoms that concern you.

## 2016-11-12 NOTE — ED Triage Notes (Signed)
Pt has had numbness and tingling to extremities for past couple months on and off. This week has been having dizziness. Went to doctor today for symptoms and he sent pt by ambulance for evaluation. Lightheadedness worse when changing position. Left eye starts to droop at times per pt.  Also c/o numbness and tingling to lips/tongue

## 2016-11-12 NOTE — Assessment & Plan Note (Signed)
New problem. Unclear etiology and prognosis. Suspect anxiety. Sending to ER for urgent eval (given other symptoms as well as patient preference).

## 2016-11-12 NOTE — Progress Notes (Signed)
Pre visit review using our clinic review tool, if applicable. No additional management support is needed unless otherwise documented below in the visit note. 

## 2016-11-15 ENCOUNTER — Telehealth: Payer: Self-pay | Admitting: *Deleted

## 2016-11-15 LAB — URINE CULTURE: SPECIAL REQUESTS: NORMAL

## 2016-11-15 NOTE — Telephone Encounter (Signed)
Called pt but was unable to leave a voicemail so a MyChart was sent letting pt know that ED printed a rx for amoxicillin-clavulanate (AUGMENTIN) 875-125 MG tablet  .

## 2016-11-15 NOTE — Telephone Encounter (Signed)
Patient has requested a antibiotic to treat her Uti, she does breastfeed.  Pharmacy [facility]  Pt contact [phone]

## 2016-11-16 ENCOUNTER — Ambulatory Visit
Admission: RE | Admit: 2016-11-16 | Discharge: 2016-11-16 | Disposition: A | Discharge status: Home / Self Care | Payer: 59 | Source: Ambulatory Visit | Attending: Obstetrics and Gynecology | Admitting: Obstetrics and Gynecology

## 2016-11-16 ENCOUNTER — Ambulatory Visit: Payer: 59 | Admitting: Certified Nurse Midwife

## 2016-11-16 ENCOUNTER — Encounter: Payer: Self-pay | Admitting: Certified Nurse Midwife

## 2016-11-16 ENCOUNTER — Other Ambulatory Visit (INDEPENDENT_AMBULATORY_CARE_PROVIDER_SITE_OTHER): Payer: 59

## 2016-11-16 ENCOUNTER — Other Ambulatory Visit: Payer: Self-pay | Admitting: Certified Nurse Midwife

## 2016-11-16 VITALS — BP 100/65 | HR 95 | Wt 132.5 lb

## 2016-11-16 DIAGNOSIS — R102 Pelvic and perineal pain: Secondary | ICD-10-CM

## 2016-11-16 DIAGNOSIS — R1030 Lower abdominal pain, unspecified: Secondary | ICD-10-CM | POA: Diagnosis not present

## 2016-11-16 DIAGNOSIS — Z30432 Encounter for removal of intrauterine contraceptive device: Secondary | ICD-10-CM

## 2016-11-16 DIAGNOSIS — T8332XD Displacement of intrauterine contraceptive device, subsequent encounter: Secondary | ICD-10-CM | POA: Diagnosis not present

## 2016-11-16 NOTE — Progress Notes (Signed)
PHARMACY  CONSULT NOTE    Pharmacy Consult for ED CULTURE RESULTS   Allergies  Allergen Reactions  . Influenza Vaccines Hives  . Sulfa Antibiotics     Hives Rash    Patient Measurements: Height:  (157.5 cm) Weight: 130 lb (59 kg) IBW/kg (Calculated) : 50.1 Labs: No results for input(s): WBC, HGB, HCT, PLT, APTT, CREATININE, LABCREA, CREATININE, CREAT24HRUR, MG, PHOS, ALBUMIN, PROT, ALBUMIN, AST, ALT, ALKPHOS, BILITOT, BILIDIR, IBILI in the last 72 hours. Estimated Creatinine Clearance: 83.5 mL/min (by C-G formula based on SCr of 0.79 mg/dL).   Microbiology: Recent Results (from the past 720 hour(s))  Urine Culture     Status: None   Collection Time: 11/12/16  2:54 PM  Result Value Ref Range Status   Culture ESCHERICHIA COLI  Final   Colony Count Greater than 100,000 CFU/mL  Final   Organism ID, Bacteria ESCHERICHIA COLI  Final      Susceptibility   Escherichia coli -  (no method available)    AMPICILLIN >=32 Resistant     AMOX/CLAVULANIC 4 Sensitive     AMPICILLIN/SULBACTAM 16 Intermediate     PIP/TAZO <=4 Sensitive     IMIPENEM <=0.25 Sensitive     CEFAZOLIN 23 Sensitive     CEFTRIAXONE <=1 Sensitive     CEFTAZIDIME <=1 Sensitive     CEFEPIME <=1 Sensitive     GENTAMICIN <=1 Sensitive     TOBRAMYCIN <=1 Sensitive     CIPROFLOXACIN <=0.25 Sensitive     LEVOFLOXACIN <=0.12 Sensitive     NITROFURANTOIN <=16 Sensitive     TRIMETH/SULFA* <=20 Sensitive      * NR=NOT REPORTABLE,SEE COMMENTORAL therapy:A cefazolin MIC of <32 predicts susceptibility to the oral agents cefaclor,cefdinir,cefpodoxime,cefprozil,cefuroxime,cephalexin,and loracarbef when used for therapy of uncomplicated UTIs due to E.coli,K.pneumomiae,and P.mirabilis. PARENTERAL therapy: A cefazolinMIC of >8 indicates resistance to parenteralcefazolin. An alternate test method must beperformed to confirm susceptibility to parenteralcefazolin.  Urine culture     Status: Abnormal   Collection Time: 11/12/16   6:12 PM  Result Value Ref Range Status   Specimen Description URINE, CLEAN CATCH  Final   Special Requests Normal  Final   Culture >=100,000 COLONIES/mL ESCHERICHIA COLI (A)  Final   Report Status 11/15/2016 FINAL  Final   Organism ID, Bacteria ESCHERICHIA COLI (A)  Final      Susceptibility   Escherichia coli - MIC*    AMPICILLIN >=32 RESISTANT Resistant     CEFAZOLIN <=4 SENSITIVE Sensitive     CEFTRIAXONE <=1 SENSITIVE Sensitive     CIPROFLOXACIN <=0.25 SENSITIVE Sensitive     GENTAMICIN <=1 SENSITIVE Sensitive     IMIPENEM <=0.25 SENSITIVE Sensitive     NITROFURANTOIN <=16 SENSITIVE Sensitive     TRIMETH/SULFA <=20 SENSITIVE Sensitive     AMPICILLIN/SULBACTAM >=32 RESISTANT Resistant     PIP/TAZO <=4 SENSITIVE Sensitive     Extended ESBL NEGATIVE Sensitive     * >=100,000 COLONIES/mL ESCHERICHIA COLI    Assessment: 26 yo female with recent visit to ED for parethesia and pain in extremities. Patient UA questionable to UTI, and patient was discharged on Augmentin 875 BID x 10 days. Culture results now showing E. Coli >100,000 resistant to Augmentin.    Plan:  After discussion with Dr. Mayford Knife patient will be prescribed cephalexin  TID x 7 days. Spoke with patient concerning culture results and change in therapy. Patient verbalized understanding. RX was called into patient's pharmacy of choice, [facility] on 11/16/16 @ 1450.  Gardner Candle, PharmD, BCPS Clinical Pharmacist 11/16/2016 2:02 PM

## 2016-11-16 NOTE — Progress Notes (Signed)
   GYNECOLOGY OFFICE PROCEDURE NOTE  Katelyn Whitehead is a 26 y.o. G2P1011 here for  IUD removal. No GYN concerns. She states that she is having pinching and lightheadedness. She has been to the ED for evaluation of her lightheadedness. Everything came back normal.   Last pap smear was on March 2017 and was normal.  IUD Removal  Patient identified, informed consent performed, consent signed.  Patient was in the dorsal lithotomy position, normal external genitalia was noted.  A speculum was placed in the patient's vagina, normal discharge and small amount of blood was noted, no lesions. The cervix was visualized, no lesions, no abnormal discharge.  The strings of the IUD were not visualized. A [place] clamp used to grasp just inside the cervix. No strings were found. Discussed finding with patient and reviewed next steps. Vaginal ultrasound was completed with no evidence of IUD. Dr. Logan Bores consulted on care.  Patient states that she has check every time she uses the restroom and denies that it has come out. Additionally she denies passing vaginal clots. Abdominal x ray ordered.   Doreene Burke, CNM  ULTRASOUND REPORT  Location: [facility] Date of Service: 11/16/16   Indications:IUD string lost Findings:  The uterus measures 6.2 x 3.3 x 5 cm. Echo texture is homogenous without evidence of focal masses.  There are small punctate calcifications seen throughout the endometrium with no evidence of IUD.    Right Ovary measures 3.1 x 2.1 x 1.8 cm. It is normal in appearance. Left Ovary measures 2.8 x 1.5 x 2.2 cm. It is normal appearance. Survey of the adnexa demonstrates no adnexal masses. There is no free fluid in the cul de sac.  Impression: 1. No evidence of IUD  Recommendations: 1.Clinical correlation with the patient's History and Physical Exam.

## 2016-11-16 NOTE — Telephone Encounter (Signed)
Katelyn Whitehead, called in inquiring about x ray results. Markell informed of results. Instructed to make an appointment with Dr. Logan Bores to schedule pre op and out patient procedure for removal of IUD.   Doreene Burke, CNM

## 2016-11-18 ENCOUNTER — Ambulatory Visit (INDEPENDENT_AMBULATORY_CARE_PROVIDER_SITE_OTHER): Payer: 59 | Admitting: Obstetrics and Gynecology

## 2016-11-18 ENCOUNTER — Encounter: Payer: Self-pay | Admitting: Obstetrics and Gynecology

## 2016-11-18 ENCOUNTER — Ambulatory Visit: Payer: Self-pay | Admitting: Family Medicine

## 2016-11-18 VITALS — BP 89/61 | HR 106 | Ht 62.0 in | Wt 129.0 lb

## 2016-11-18 DIAGNOSIS — T8332XA Displacement of intrauterine contraceptive device, initial encounter: Secondary | ICD-10-CM | POA: Diagnosis not present

## 2016-11-18 DIAGNOSIS — Z01818 Encounter for other preprocedural examination: Secondary | ICD-10-CM | POA: Diagnosis not present

## 2016-11-18 NOTE — H&P (Signed)
PRE-OPERATIVE HISTORY AND PHYSICAL EXAM  PCP:  Tommie Sams, DO Subjective:   HPI:  Katelyn Whitehead is a 26 y.o. G2P1011.  No LMP recorded.  She presents today for a pre-op discussion and PE.  She has the following symptoms:  IUD strings not found on exam. Abdominal IUD.  Review of Systems:   Constitutional: Denied constitutional symptoms, night sweats, recent illness, fatigue, fever, insomnia and weight loss.  Eyes: Denied eye symptoms, eye pain, photophobia, vision change and visual disturbance.  Ears/Nose/Throat/Neck: Denied ear, nose, throat or neck symptoms, hearing loss, nasal discharge, sinus congestion and sore throat.  Cardiovascular: Denied cardiovascular symptoms, arrhythmia, chest pain/pressure, edema, exercise intolerance, orthopnea and palpitations.  Respiratory: Denied pulmonary symptoms, asthma, pleuritic pain, productive sputum, cough, dyspnea and wheezing.  Gastrointestinal: Denied, gastro-esophageal reflux, melena, nausea and vomiting.  Genitourinary: Denied genitourinary symptoms including symptomatic vaginal discharge, pelvic relaxation issues, and urinary complaints.  Musculoskeletal: Denied musculoskeletal symptoms, stiffness, swelling, muscle weakness and myalgia.  Dermatologic: Denied dermatology symptoms, rash and scar.  Neurologic: Denied neurology symptoms, dizziness, headache, neck pain and syncope.  Psychiatric: Denied psychiatric symptoms, anxiety and depression.  Endocrine: Denied endocrine symptoms including hot flashes and night sweats.   OB History  Gravida Para Term Preterm AB Living  SAB TAB Ectopic Multiple Live Births  1     0 1    # Outcome Date GA Lbr Len/2nd Weight Sex Delivery Anes PTL Lv  2 Term 09/10/16 [redacted]w[redacted]d / 04:52 8 lb 5.7 oz (3.79 kg) F Vag-Vacuum EPI  LIV  1 SAB 12/09/15 [redacted]w[redacted]d      N FD      Past Medical History:  Diagnosis Date  . ADHD (attention deficit hyperactivity disorder)   . Anxiety   . Asthma    childhood  . Depression   . Mild mitral and aortic regurgitation    a. 04/2013 Echo: EF 50-55%, mild AI, mild MR, no MVP.  Marland Kitchen Palpitations    a. 2012 Holter: sinus tach, pvc's.  . Panic disorder     Past Surgical History:  Procedure Laterality Date  . WISDOM TOOTH EXTRACTION        SOCIAL HISTORY: History  Smoking Status  . Never Smoker  Smokeless Tobacco  . Never Used   History  Alcohol Use No   History  Drug Use No    Family History  Problem Relation Age of Onset  . Depression Mother   . Drug abuse Father   . ADD / ADHD Brother   . Depression Maternal Aunt   . Dementia Maternal Grandmother   . Depression Maternal Grandmother     ALLERGIES:  Influenza vaccines and Sulfa antibiotics  MEDS:   Current Outpatient Prescriptions on File Prior to Visit  Medication Sig Dispense Refill  . FLUoxetine (PROZAC) 20 MG tablet Take one or 2 pills qd 60 tablet 3  . LORazepam (ATIVAN) 1 MG tablet Take 1 tablet (1 mg total) by mouth every 8 (eight) hours. 30 tablet 0  . Prenatal Vit-Fe Fumarate-FA (PRENATAL MULTIVITAMIN) TABS tablet Take 1 tablet by mouth daily at 12 noon. 60 tablet 3  . Vitamin D, Ergocalciferol, (DRISDOL) 50000 units CAPS capsule Take 1 capsule (50,000 Units total) by mouth every 7 (seven) days. 30 capsule 1   Current Facility-Administered Medications on File Prior to Visit  Medication Dose Route Frequency Provider Last Rate Last Dose  . levonorgestrel (MIRENA) 20 MCG/24HR  IUD   Intrauterine Once Melody NIKE, CNM      . pantoprazole (PROTONIX) injection 40 mg  40 mg Intravenous Q24H Melody N Shambley, CNM        No orders of the defined types were placed in this encounter.    Physical examination BP (!) 89/61   Pulse (!) 106   Ht  (1.575 m)   Wt 129 lb (58.5 kg)   BMI 23.59 kg/m   General NAD, Conversant  HEENT Atraumatic; Op clear with mmm.  Normo-cephalic. Pupils reactive. Anicteric sclerae  Thyroid/Neck Smooth without nodularity or  enlargement. Normal ROM.  Neck Supple.  Skin No rashes, lesions or ulceration. Normal palpated skin turgor. No nodularity.  Breasts: No masses or discharge.  Symmetric.  No axillary adenopathy.  Lungs: Clear to auscultation.No rales or wheezes. Normal Respiratory effort, no retractions.  Heart: NSR.  No murmurs or rubs appreciated. No periferal edema  Abdomen: Soft.  Non-tender.  No masses.  No HSM. No hernia  Extremities: Moves all appropriately.  Normal ROM for age. No lymphadenopathy.  Neuro: Oriented to PPT.  Normal mood. Normal affect.     Pelvic:   Vulva: Normal appearance.  No lesions.  Vagina: No lesions or abnormalities noted.  Support: Normal pelvic support.  Urethra No masses tenderness or scarring.  Meatus Normal size without lesions or prolapse.  Cervix: Normal ectropion.  No lesions.  Anus: Normal exam.  No lesions.  Perineum: Normal exam.  No lesions.        Bimanual   Uterus: Normal size.  Non-tender.  Mobile.  AV.  Adnexae: No masses.  Non-tender to palpation.  Cul-de-sac: Negative for abnormality.   Assessment:   G2P1011 Patient Active Problem List   Diagnosis Date Noted  . Pre-syncope 11/12/2016  . Numbness and tingling 11/12/2016  . Vitamin D deficiency 05/13/2016  . B12 deficiency 05/13/2016  . Mild mitral and aortic regurgitation   . Panic disorder   . ADD (attention deficit disorder) 12/04/2014  . Depression, major, recurrent, moderate (HCC) 12/04/2014  . Mitral valve prolapse 02/24/2011    1. Preop examination   2. Displacement of intrauterine contraceptive device, initial encounter     Pelvic/abdominal IUD placement. Not intrauterine.  Plan:   1.  Laparoscopic removal of IUD. 2.  Patient declined replacement of IUD intrauterine during surgery. 3.  Additional birth control methods discussed-I have made it clear that this IUD is not currently providing adequate birth control. She would like a chance to consider her other options before  deciding.

## 2016-11-18 NOTE — Addendum Note (Signed)
Addended by: Elonda Husky on: 11/18/2016 12:16 PM   Modules accepted: Orders, SmartSet

## 2016-11-18 NOTE — Progress Notes (Signed)
PRE-OPERATIVE HISTORY AND PHYSICAL EXAM  PCP:  Tommie Sams, DO Subjective:   HPI:  Katelyn Whitehead is a 26 y.o. G2P1011.  No LMP recorded.  She presents today for a pre-op discussion and PE.  She has the following symptoms:  IUD strings not found on exam. Abdominal IUD.  Review of Systems:   Constitutional: Denied constitutional symptoms, night sweats, recent illness, fatigue, fever, insomnia and weight loss.  Eyes: Denied eye symptoms, eye pain, photophobia, vision change and visual disturbance.  Ears/Nose/Throat/Neck: Denied ear, nose, throat or neck symptoms, hearing loss, nasal discharge, sinus congestion and sore throat.  Cardiovascular: Denied cardiovascular symptoms, arrhythmia, chest pain/pressure, edema, exercise intolerance, orthopnea and palpitations.  Respiratory: Denied pulmonary symptoms, asthma, pleuritic pain, productive sputum, cough, dyspnea and wheezing.  Gastrointestinal: Denied, gastro-esophageal reflux, melena, nausea and vomiting.  Genitourinary: Denied genitourinary symptoms including symptomatic vaginal discharge, pelvic relaxation issues, and urinary complaints.  Musculoskeletal: Denied musculoskeletal symptoms, stiffness, swelling, muscle weakness and myalgia.  Dermatologic: Denied dermatology symptoms, rash and scar.  Neurologic: Denied neurology symptoms, dizziness, headache, neck pain and syncope.  Psychiatric: Denied psychiatric symptoms, anxiety and depression.  Endocrine: Denied endocrine symptoms including hot flashes and night sweats.   OB History  Gravida Para Term Preterm AB Living  SAB TAB Ectopic Multiple Live Births  1     0 1    # Outcome Date GA Lbr Len/2nd Weight Sex Delivery Anes PTL Lv  2 Term 09/10/16 [redacted]w[redacted]d / 04:52 8 lb 5.7 oz (3.79 kg) F Vag-Vacuum EPI  LIV  1 SAB 12/09/15 [redacted]w[redacted]d      N FD      Past Medical History:  Diagnosis Date  . ADHD (attention deficit hyperactivity disorder)   . Anxiety   . Asthma    childhood  . Depression   . Mild mitral and aortic regurgitation    a. 04/2013 Echo: EF 50-55%, mild AI, mild MR, no MVP.  Marland Kitchen Palpitations    a. 2012 Holter: sinus tach, pvc's.  . Panic disorder     Past Surgical History:  Procedure Laterality Date  . WISDOM TOOTH EXTRACTION        SOCIAL HISTORY: History  Smoking Status  . Never Smoker  Smokeless Tobacco  . Never Used   History  Alcohol Use No   History  Drug Use No    Family History  Problem Relation Age of Onset  . Depression Mother   . Drug abuse Father   . ADD / ADHD Brother   . Depression Maternal Aunt   . Dementia Maternal Grandmother   . Depression Maternal Grandmother     ALLERGIES:  Influenza vaccines and Sulfa antibiotics  MEDS:   Current Outpatient Prescriptions on File Prior to Visit  Medication Sig Dispense Refill  . FLUoxetine (PROZAC) 20 MG tablet Take one or 2 pills qd 60 tablet 3  . LORazepam (ATIVAN) 1 MG tablet Take 1 tablet (1 mg total) by mouth every 8 (eight) hours. 30 tablet 0  . Prenatal Vit-Fe Fumarate-FA (PRENATAL MULTIVITAMIN) TABS tablet Take 1 tablet by mouth daily at 12 noon. 60 tablet 3  . Vitamin D, Ergocalciferol, (DRISDOL) 50000 units CAPS capsule Take 1 capsule (50,000 Units total) by mouth every 7 (seven) days. 30 capsule 1   Current Facility-Administered Medications on File Prior to Visit  Medication Dose Route Frequency Provider Last Rate Last Dose  . levonorgestrel (MIRENA) 20 MCG/24HR IUD  Intrauterine Once [employer], CNM      . pantoprazole (PROTONIX) injection 40 mg  40 mg Intravenous Q24H Melody N Shambley, CNM        No orders of the defined types were placed in this encounter.    Physical examination BP (!) 89/61   Pulse (!) 106   Ht  (1.575 m)   Wt 129 lb (58.5 kg)   BMI 23.59 kg/m   General NAD, Conversant  HEENT Atraumatic; Op clear with mmm.  Normo-cephalic. Pupils reactive. Anicteric sclerae  Thyroid/Neck Smooth without nodularity or  enlargement. Normal ROM.  Neck Supple.  Skin No rashes, lesions or ulceration. Normal palpated skin turgor. No nodularity.  Breasts: No masses or discharge.  Symmetric.  No axillary adenopathy.  Lungs: Clear to auscultation.No rales or wheezes. Normal Respiratory effort, no retractions.  Heart: NSR.  No murmurs or rubs appreciated. No periferal edema  Abdomen: Soft.  Non-tender.  No masses.  No HSM. No hernia  Extremities: Moves all appropriately.  Normal ROM for age. No lymphadenopathy.  Neuro: Oriented to PPT.  Normal mood. Normal affect.     Pelvic:   Vulva: Normal appearance.  No lesions.  Vagina: No lesions or abnormalities noted.  Support: Normal pelvic support.  Urethra No masses tenderness or scarring.  Meatus Normal size without lesions or prolapse.  Cervix: Normal ectropion.  No lesions.  Anus: Normal exam.  No lesions.  Perineum: Normal exam.  No lesions.        Bimanual   Uterus: Normal size.  Non-tender.  Mobile.  AV.  Adnexae: No masses.  Non-tender to palpation.  Cul-de-sac: Negative for abnormality.   Assessment:   G2P1011 Patient Active Problem List   Diagnosis Date Noted  . Pre-syncope 11/12/2016  . Numbness and tingling 11/12/2016  . Vitamin D deficiency 05/13/2016  . B12 deficiency 05/13/2016  . Mild mitral and aortic regurgitation   . Panic disorder   . ADD (attention deficit disorder) 12/04/2014  . Depression, major, recurrent, moderate (HCC) 12/04/2014  . Mitral valve prolapse 02/24/2011    1. Preop examination   2. Displacement of intrauterine contraceptive device, initial encounter     Pelvic/abdominal IUD placement. Not intrauterine.  Plan:   1.  Laparoscopic removal of IUD. 2.  Patient declined replacement of IUD intrauterine during surgery. 3.  Additional birth control methods discussed-I have made it clear that this IUD is not currently providing adequate birth control. She would like a chance to consider her other options before  deciding.  Pre-op discussions regarding Risks and Benefits of her scheduled surgery. LPY for Pain We have discussed the procedure of Exploratory Laparoscopy in detail.  I have informed her that Laparoscopy, like other surgical procedures, entails the following risks:  bleeding, infection, damage to bowel, bladder or other internal organ, and the risk or anesthesia.  We have discussed the possibility that flouroscopy may be necessary to locate her IUD.  I have answered all of her questions and I believe she has been well informed regarding the risks/benefits of Exploratory Laparoscopy for removal of IUD.   Elonda Husky, M.D. 11/18/2016 12:05 PM

## 2016-11-19 ENCOUNTER — Ambulatory Visit: Payer: Self-pay | Admitting: Certified Nurse Midwife

## 2016-11-19 ENCOUNTER — Inpatient Hospital Stay: Admission: RE | Admit: 2016-11-19 | Payer: Self-pay | Source: Ambulatory Visit

## 2016-11-22 ENCOUNTER — Encounter
Admission: RE | Disposition: A | Discharge status: Home / Self Care | Payer: Self-pay | Source: Ambulatory Visit | Attending: Obstetrics and Gynecology

## 2016-11-22 ENCOUNTER — Ambulatory Visit: Payer: 59 | Admitting: Anesthesiology

## 2016-11-22 ENCOUNTER — Encounter: Payer: Self-pay | Admitting: *Deleted

## 2016-11-22 ENCOUNTER — Ambulatory Visit: Payer: 59

## 2016-11-22 ENCOUNTER — Ambulatory Visit
Admission: RE | Admit: 2016-11-22 | Discharge: 2016-11-22 | Disposition: A | Discharge status: Home / Self Care | Payer: 59 | Source: Ambulatory Visit | Attending: Obstetrics and Gynecology | Admitting: Obstetrics and Gynecology

## 2016-11-22 DIAGNOSIS — F410 Panic disorder [episodic paroxysmal anxiety] without agoraphobia: Secondary | ICD-10-CM | POA: Diagnosis not present

## 2016-11-22 DIAGNOSIS — I493 Ventricular premature depolarization: Secondary | ICD-10-CM | POA: Insufficient documentation

## 2016-11-22 DIAGNOSIS — R0789 Other chest pain: Secondary | ICD-10-CM | POA: Diagnosis not present

## 2016-11-22 DIAGNOSIS — Z79899 Other long term (current) drug therapy: Secondary | ICD-10-CM | POA: Diagnosis not present

## 2016-11-22 DIAGNOSIS — Z136 Encounter for screening for cardiovascular disorders: Secondary | ICD-10-CM | POA: Diagnosis not present

## 2016-11-22 DIAGNOSIS — Y831 Surgical operation with implant of artificial internal device as the cause of abnormal reaction of the patient, or of later complication, without mention of misadventure at the time of the procedure: Secondary | ICD-10-CM | POA: Diagnosis not present

## 2016-11-22 DIAGNOSIS — F331 Major depressive disorder, recurrent, moderate: Secondary | ICD-10-CM | POA: Insufficient documentation

## 2016-11-22 DIAGNOSIS — R079 Chest pain, unspecified: Secondary | ICD-10-CM | POA: Diagnosis not present

## 2016-11-22 DIAGNOSIS — T8332XA Displacement of intrauterine contraceptive device, initial encounter: Secondary | ICD-10-CM | POA: Diagnosis not present

## 2016-11-22 HISTORY — PX: IUD REMOVAL: SHX5392

## 2016-11-22 LAB — CBC
HEMATOCRIT: 37.2 % (ref 35.0–47.0)
Hemoglobin: 12.4 g/dL (ref 12.0–16.0)
MCH: 26 pg (ref 26.0–34.0)
MCHC: 33.3 g/dL (ref 32.0–36.0)
MCV: 78.1 fL — AB (ref 80.0–100.0)
Platelets: 311 10*3/uL (ref 150–440)
RBC: 4.76 MIL/uL (ref 3.80–5.20)
RDW: 18.7 % — ABNORMAL HIGH (ref 11.5–14.5)
WBC: 5.5 10*3/uL (ref 3.6–11.0)

## 2016-11-22 LAB — TYPE AND SCREEN
ABO/RH(D): O POS
ANTIBODY SCREEN: NEGATIVE

## 2016-11-22 LAB — POCT PREGNANCY, URINE: PREG TEST UR: NEGATIVE

## 2016-11-22 SURGERY — REMOVAL, INTRAUTERINE DEVICE
Anesthesia: General

## 2016-11-22 MED ORDER — MORPHINE SULFATE (PF) 4 MG/ML IV SOLN
INTRAVENOUS | Status: AC
  Administered 2016-11-22: 2 mg via INTRAVENOUS
  Filled 2016-11-22: qty 1

## 2016-11-22 MED ORDER — ONDANSETRON HCL 4 MG/2ML IJ SOLN
INTRAMUSCULAR | Status: DC | PRN
  Administered 2016-11-22: 4 mg via INTRAVENOUS

## 2016-11-22 MED ORDER — MIDAZOLAM HCL 2 MG/2ML IJ SOLN
INTRAMUSCULAR | Status: DC | PRN
Start: 2016-11-22 — End: 2016-11-22
  Administered 2016-11-22: 2 mg via INTRAVENOUS

## 2016-11-22 MED ORDER — MORPHINE SULFATE (PF) 4 MG/ML IV SOLN
2.0000 mg | Freq: Once | INTRAVENOUS | Status: AC
  Administered 2016-11-22: 2 mg via INTRAVENOUS

## 2016-11-22 MED ORDER — KETOROLAC TROMETHAMINE 30 MG/ML IJ SOLN
INTRAMUSCULAR | Status: AC
  Filled 2016-11-22: qty 1

## 2016-11-22 MED ORDER — MIDAZOLAM HCL 2 MG/2ML IJ SOLN
INTRAMUSCULAR | Status: AC
  Filled 2016-11-22: qty 2

## 2016-11-22 MED ORDER — OXYCODONE-ACETAMINOPHEN 5-325 MG PO TABS: 1.0000 | ORAL_TABLET | Freq: Four times a day (QID) | ORAL | 0 refills | Status: DC | PRN

## 2016-11-22 MED ORDER — DEXAMETHASONE SODIUM PHOSPHATE 10 MG/ML IJ SOLN
INTRAMUSCULAR | Status: DC | PRN
  Administered 2016-11-22: 10 mg via INTRAVENOUS

## 2016-11-22 MED ORDER — SODIUM CHLORIDE 0.9 % IJ SOLN
INTRAMUSCULAR | Status: AC
  Filled 2016-11-22: qty 50

## 2016-11-22 MED ORDER — ROCURONIUM BROMIDE 100 MG/10ML IV SOLN
INTRAVENOUS | Status: DC | PRN
Start: 2016-11-22 — End: 2016-11-22
  Administered 2016-11-22: 10 mg via INTRAVENOUS
  Administered 2016-11-22: 30 mg via INTRAVENOUS

## 2016-11-22 MED ORDER — KETOROLAC TROMETHAMINE 30 MG/ML IJ SOLN
INTRAMUSCULAR | Status: DC | PRN
  Administered 2016-11-22: 30 mg via INTRAVENOUS

## 2016-11-22 MED ORDER — FENTANYL CITRATE (PF) 100 MCG/2ML IJ SOLN
INTRAMUSCULAR | Status: DC | PRN
  Administered 2016-11-22: 100 ug via INTRAVENOUS
  Administered 2016-11-22: 50 ug via INTRAVENOUS

## 2016-11-22 MED ORDER — DEXTROSE IN LACTATED RINGERS 5 % IV SOLN: INTRAVENOUS | Status: DC

## 2016-11-22 MED ORDER — ACETAMINOPHEN 10 MG/ML IV SOLN
INTRAVENOUS | Status: DC | PRN
  Administered 2016-11-22: 1000 mg via INTRAVENOUS

## 2016-11-22 MED ORDER — IPRATROPIUM-ALBUTEROL 0.5-2.5 (3) MG/3ML IN SOLN
RESPIRATORY_TRACT | Status: AC
  Filled 2016-11-22: qty 3

## 2016-11-22 MED ORDER — ONDANSETRON HCL 4 MG/2ML IJ SOLN
INTRAMUSCULAR | Status: AC
  Filled 2016-11-22: qty 2

## 2016-11-22 MED ORDER — OXYCODONE-ACETAMINOPHEN 5-325 MG PO TABS: 1.0000 | ORAL_TABLET | ORAL | Status: DC | PRN

## 2016-11-22 MED ORDER — FENTANYL CITRATE (PF) 100 MCG/2ML IJ SOLN
INTRAMUSCULAR | Status: AC
  Filled 2016-11-22: qty 2

## 2016-11-22 MED ORDER — ACETAMINOPHEN 10 MG/ML IV SOLN
INTRAVENOUS | Status: AC
  Filled 2016-11-22: qty 100

## 2016-11-22 MED ORDER — DEXAMETHASONE SODIUM PHOSPHATE 10 MG/ML IJ SOLN
INTRAMUSCULAR | Status: AC
  Filled 2016-11-22: qty 1

## 2016-11-22 MED ORDER — FENTANYL CITRATE (PF) 100 MCG/2ML IJ SOLN
25.0000 ug | INTRAMUSCULAR | Status: DC | PRN
  Administered 2016-11-22: 25 ug via INTRAVENOUS

## 2016-11-22 MED ORDER — ONDANSETRON 4 MG PO TBDP: 4.0000 mg | ORAL_TABLET | Freq: Four times a day (QID) | ORAL | Status: DC | PRN

## 2016-11-22 MED ORDER — ONDANSETRON HCL 4 MG/2ML IJ SOLN: 4.0000 mg | Freq: Four times a day (QID) | INTRAMUSCULAR | Status: DC | PRN

## 2016-11-22 MED ORDER — BUPIVACAINE HCL (PF) 0.5 % IJ SOLN
INTRAMUSCULAR | Status: AC
  Filled 2016-11-22: qty 30

## 2016-11-22 MED ORDER — LACTATED RINGERS IV SOLN
INTRAVENOUS | Status: DC
  Administered 2016-11-22: 09:00:00 via INTRAVENOUS

## 2016-11-22 MED ORDER — SUGAMMADEX SODIUM 200 MG/2ML IV SOLN
INTRAVENOUS | Status: DC | PRN
  Administered 2016-11-22: 120 mg via INTRAVENOUS

## 2016-11-22 MED ORDER — NITROGLYCERIN 0.4 MG SL SUBL
0.4000 mg | SUBLINGUAL_TABLET | Freq: Once | SUBLINGUAL | Status: AC
  Administered 2016-11-22: 0.4 mg via SUBLINGUAL

## 2016-11-22 MED ORDER — LORAZEPAM 1 MG PO TABS
1.0000 mg | ORAL_TABLET | Freq: Three times a day (TID) | ORAL | 0 refills | Status: DC
Start: 2016-11-22 — End: 2016-12-10

## 2016-11-22 MED ORDER — SUGAMMADEX SODIUM 200 MG/2ML IV SOLN
INTRAVENOUS | Status: AC
  Filled 2016-11-22: qty 2

## 2016-11-22 MED ORDER — PROPOFOL 10 MG/ML IV BOLUS
INTRAVENOUS | Status: AC
  Filled 2016-11-22: qty 20

## 2016-11-22 MED ORDER — ROCURONIUM BROMIDE 50 MG/5ML IV SOLN
INTRAVENOUS | Status: AC
  Filled 2016-11-22: qty 1

## 2016-11-22 MED ORDER — BUPIVACAINE HCL 0.5 % IJ SOLN
INTRAMUSCULAR | Status: DC | PRN
  Administered 2016-11-22: 8 mL

## 2016-11-22 MED ORDER — PROPOFOL 10 MG/ML IV BOLUS
INTRAVENOUS | Status: DC | PRN
  Administered 2016-11-22: 150 mg via INTRAVENOUS

## 2016-11-22 MED ORDER — MIDAZOLAM HCL 2 MG/2ML IJ SOLN
1.0000 mg | Freq: Once | INTRAMUSCULAR | Status: AC
  Administered 2016-11-22: 1 mg via INTRAVENOUS

## 2016-11-22 MED ORDER — VASOPRESSIN 20 UNIT/ML IV SOLN
INTRAVENOUS | Status: AC
  Filled 2016-11-22: qty 1

## 2016-11-22 MED ORDER — ONDANSETRON HCL 4 MG/2ML IJ SOLN: 4.0000 mg | Freq: Once | INTRAMUSCULAR | Status: DC | PRN

## 2016-11-22 MED ORDER — NITROGLYCERIN 0.4 MG SL SUBL
SUBLINGUAL_TABLET | SUBLINGUAL | Status: AC
Start: 2016-11-22 — End: 2016-11-22
  Administered 2016-11-22: 0.4 mg via SUBLINGUAL
  Filled 2016-11-22: qty 1

## 2016-11-22 MED ORDER — IPRATROPIUM-ALBUTEROL 0.5-2.5 (3) MG/3ML IN SOLN
3.0000 mL | RESPIRATORY_TRACT | Status: DC
  Administered 2016-11-22: 3 mL via RESPIRATORY_TRACT

## 2016-11-22 MED ORDER — KETOROLAC TROMETHAMINE 30 MG/ML IJ SOLN: 30.0000 mg | Freq: Once | INTRAMUSCULAR | Status: DC

## 2016-11-22 MED ORDER — MIDAZOLAM HCL 2 MG/2ML IJ SOLN
INTRAMUSCULAR | Status: AC
  Administered 2016-11-22: 1 mg via INTRAVENOUS
  Filled 2016-11-22: qty 2

## 2016-11-22 SURGICAL SUPPLY — 54 items
ADHESIVE MASTISOL STRL (MISCELLANEOUS) ×2 IMPLANT
BAG DECANTER FOR FLEXI CONT (MISCELLANEOUS) IMPLANT
BLADE SURG 15 STRL SS SAFETY (BLADE) ×2 IMPLANT
BLADE SURG SZ11 CARB STEEL (BLADE) ×2 IMPLANT
CANISTER SUCT 1200ML W/VALVE (MISCELLANEOUS) ×2 IMPLANT
CATH ROBINSON RED A/P 16FR (CATHETERS) ×2 IMPLANT
CATH TRAY 16F METER LATEX (MISCELLANEOUS) IMPLANT
CHLORAPREP W/TINT 26ML (MISCELLANEOUS) ×2 IMPLANT
CLEANER CAUTERY TIP 5X5 PAD (MISCELLANEOUS) ×1 IMPLANT
CORD MONOPOLAR M/FML 12FT (MISCELLANEOUS) IMPLANT
COVER MAYO STAND STRL (DRAPES) ×2 IMPLANT
DRAPE LEGGINS SURG 28X43 STRL (DRAPES) ×2 IMPLANT
DRAPE XRAY CASSETTE 23X24 (DRAPES) IMPLANT
ELECT REM PT RETURN 9FT ADLT (ELECTROSURGICAL) ×2
ELECTRODE REM PT RTRN 9FT ADLT (ELECTROSURGICAL) ×1 IMPLANT
GOWN STRL REUS W/ TWL LRG LVL3 (GOWN DISPOSABLE) ×2 IMPLANT
GOWN STRL REUS W/TWL LRG LVL3 (GOWN DISPOSABLE) ×2
GOWN STRL REUS W/TWL XL LVL3 (GOWN DISPOSABLE) ×2 IMPLANT
HANDLE YANKAUER SUCT BULB TIP (MISCELLANEOUS) ×2 IMPLANT
IRRIGATION STRYKERFLOW (MISCELLANEOUS) IMPLANT
IRRIGATOR STRYKERFLOW (MISCELLANEOUS)
IV LACTATED RINGERS 1000ML (IV SOLUTION) IMPLANT
KIT PINK PAD W/HEAD ARE REST (MISCELLANEOUS) ×2
KIT PINK PAD W/HEAD ARM REST (MISCELLANEOUS) ×1 IMPLANT
KIT RM TURNOVER CYSTO AR (KITS) ×2 IMPLANT
LIGASURE MARYLAND LAP STAND (ELECTROSURGICAL) IMPLANT
NEEDLE HYPO 22GX1.5 SAFETY (NEEDLE) ×2 IMPLANT
NS IRRIG 500ML POUR BTL (IV SOLUTION) ×2 IMPLANT
PACK GYN LAPAROSCOPIC (MISCELLANEOUS) ×2 IMPLANT
PAD CLEANER CAUTERY TIP 5X5 (MISCELLANEOUS) ×1
PAD OB MATERNITY 4.3X12.25 (PERSONAL CARE ITEMS) ×2 IMPLANT
PAD PREP 24X41 OB/GYN DISP (PERSONAL CARE ITEMS) ×2 IMPLANT
POUCH ENDO CATCH 10MM SPEC (MISCELLANEOUS) IMPLANT
SCISSORS METZENBAUM CVD 33 (INSTRUMENTS) IMPLANT
SLEEVE ENDOPATH XCEL 5M (ENDOMECHANICALS) ×2 IMPLANT
SPONGE LAP 18X18 5 PK (GAUZE/BANDAGES/DRESSINGS) IMPLANT
SPONGE XRAY 4X4 16PLY STRL (MISCELLANEOUS) ×2 IMPLANT
STRIP CLOSURE SKIN 1/2X4 (GAUZE/BANDAGES/DRESSINGS) ×2 IMPLANT
SUT VIC AB 0 CT1 18XCR BRD 8 (SUTURE) IMPLANT
SUT VIC AB 0 CT1 36 (SUTURE) IMPLANT
SUT VIC AB 0 CT1 8-18 (SUTURE)
SUT VIC AB 3-0 SH 27 (SUTURE)
SUT VIC AB 3-0 SH 27X BRD (SUTURE) IMPLANT
SUT VIC AB 4-0 FS2 27 (SUTURE) ×2 IMPLANT
SUT VICRYL 0 AB UR-6 (SUTURE) ×2 IMPLANT
SUT VICRYL 0 UR6 27IN ABS (SUTURE) IMPLANT
SYR 3ML LL SCALE MARK (SYRINGE) ×2 IMPLANT
SYRINGE 10CC LL (SYRINGE) ×2 IMPLANT
TOWEL OR 17X26 4PK STRL BLUE (TOWEL DISPOSABLE) ×2 IMPLANT
TROCAR ENDO BLADELESS 11MM (ENDOMECHANICALS) IMPLANT
TROCAR XCEL NON-BLD 5MMX100MML (ENDOMECHANICALS) ×4 IMPLANT
TROCAR XCEL UNIV SLVE 11M 100M (ENDOMECHANICALS) IMPLANT
TUBING CONNECTING 10 (TUBING) ×2 IMPLANT
TUBING INSUFFLATOR HI FLOW (MISCELLANEOUS) ×2 IMPLANT

## 2016-11-22 NOTE — Op Note (Signed)
    OPERATIVE NOTE 11/22/2016 10:34 AM  PRE-OPERATIVE DIAGNOSIS:  1) misplaced intrauterine device  POST-OPERATIVE DIAGNOSIS:  1) same after removal of IUD  OPERATION:  LAPAROCOPIC INTRAUTERINE DEVICE (IUD) REMOVAL:   SURGEON(S): Surgeon(s) and Role:    * Linzie Collin, MD - Primary   ANESTHESIA: General  ESTIMATED BLOOD LOSS: 10 ml  OPERATIVE FINDINGS: Extra-uterine IUD located in omentum SPECIMEN: Mirena IUD COMPLICATIONS: None  DISPOSITION: Stable to recovery room  DESCRIPTION OF PROCEDURE:      The patient was prepped and draped in the dorsolithotomy position and placed under general anesthesia. The bladder was emptied. The cervix was grasped with a multi-toothed tenaculum and a uterine manipulator was placed within the cervical os respecting the position and curvature of the uterus. After changing gloves we proceeded abdominally. A small infraumbilical incision was made and a 5 mm trocar port was placed within the abdominopelvic cavity. The opening pressure was less than 7 mmHg.  Approximately 3 and 1/2 L of carbon dioxide gas was instilled within the abdominal pelvic cavity. The laparoscope was placed and the pelvis and abdomen were carefully inspected. The IUD was immediately located in the omentum. It was densely covered and adherent with the strings the only visible part. Initial attempt was made to remove the IUD via the strings however no traction could be applied to the omentum.A second lower quadrant port was placed under direct visualization and this allowed traction on mentum and the IUD.  In this way the IUD was easily extricated from the omentum. The omentum was carefully inspected and hemostasis was noted.  The IUD was removed via the right lower quadrant port. It was noted to be intact. Hemostasis of all areas of the pelvis was noted. The ports were removed under direct visualization.  Hemostasis was noted.  The laparoscope was removed the trocar sleeve was removed  and the incision was closed with a deep suture through the fascia of 0 Vicryl followed by subcuticular closure of the skin for all port sites. A long-acting anesthetic was injected.  Steri-Strips were applied. The uterine manipulator was removed. Hemostasis of the cervix was noted. The patient went to the recovery room in stable condition.  Elonda Husky, M.D. 11/22/2016 10:34 AM

## 2016-11-22 NOTE — Progress Notes (Signed)
cardioloy called  cxr done  Morphine given

## 2016-11-22 NOTE — H&P (Signed)
Initially done in office and here updated.  @      PRE-OPERATIVE HISTORY AND PHYSICAL EXAM  PCP:  Tommie Sams, DO Subjective:   HPI:  Katelyn Whitehead is a 26 y.o. G2P1011.  Patient's last menstrual period was 11/02/2016 (within days).  She presents today for a pre-op discussion and PE.  She has the following symptoms:  Abdominal IUD  Review of Systems:   Constitutional: Denied constitutional symptoms, night sweats, recent illness, fatigue, fever, insomnia and weight loss.  Eyes: Denied eye symptoms, eye pain, photophobia, vision change and visual disturbance.  Ears/Nose/Throat/Neck: Denied ear, nose, throat or neck symptoms, hearing loss, nasal discharge, sinus congestion and sore throat.  Cardiovascular: Denied cardiovascular symptoms, arrhythmia, chest pain/pressure, edema, exercise intolerance, orthopnea and palpitations.  Respiratory: Denied pulmonary symptoms, asthma, pleuritic pain, productive sputum, cough, dyspnea and wheezing.  Gastrointestinal: Denied, gastro-esophageal reflux, melena, nausea and vomiting.  Genitourinary: Denied genitourinary symptoms including symptomatic vaginal discharge, pelvic relaxation issues, and urinary complaints.  Musculoskeletal: Denied musculoskeletal symptoms, stiffness, swelling, muscle weakness and myalgia.  Dermatologic: Denied dermatology symptoms, rash and scar.  Neurologic: Denied neurology symptoms, dizziness, headache, neck pain and syncope.  Psychiatric: Denied psychiatric symptoms, anxiety and depression.  Endocrine: Denied endocrine symptoms including hot flashes and night sweats.   OB History  Gravida Para Term Preterm AB Living  SAB TAB Ectopic Multiple Live Births  1     0 1    # Outcome Date GA Lbr Len/2nd Weight Sex Delivery Anes PTL Lv  2 Term 09/10/16 [redacted]w[redacted]d / 04:52 8 lb 5.7 oz (3.79 kg) F Vag-Vacuum EPI  LIV  1 SAB 12/09/15 [redacted]w[redacted]d      N FD      Past Medical History:  Diagnosis Date  . ADHD  (attention deficit hyperactivity disorder)   . Anxiety   . Asthma    childhood  . Depression   . Mild mitral and aortic regurgitation    a. 04/2013 Echo: EF 50-55%, mild AI, mild MR, no MVP.  Marland Kitchen Palpitations    a. 2012 Holter: sinus tach, pvc's.  . Panic disorder     Past Surgical History:  Procedure Laterality Date  . WISDOM TOOTH EXTRACTION        SOCIAL HISTORY: History  Smoking Status  . Never Smoker  Smokeless Tobacco  . Never Used   History  Alcohol Use No   History  Drug Use No    Family History  Problem Relation Age of Onset  . Depression Mother   . Drug abuse Father   . ADD / ADHD Brother   . Depression Maternal Aunt   . Dementia Maternal Grandmother   . Depression Maternal Grandmother     ALLERGIES:  Influenza vaccines and Sulfa antibiotics  MEDS:   No current facility-administered medications on file prior to encounter.    Current Outpatient Prescriptions on File Prior to Encounter  Medication Sig Dispense Refill  . FLUoxetine (PROZAC) 20 MG tablet Take one or 2 pills qd 60 tablet 3  . LORazepam (ATIVAN) 1 MG tablet Take 1 tablet (1 mg total) by mouth every 8 (eight) hours. 30 tablet 0  . Prenatal Vit-Fe Fumarate-FA (PRENATAL MULTIVITAMIN) TABS tablet Take 1 tablet by mouth daily at 12 noon. 60 tablet 3  . Vitamin D, Ergocalciferol, (DRISDOL) 50000 units CAPS capsule Take 1 capsule (50,000 Units total) by mouth every 7 (seven) days. 30 capsule 1    Meds ordered this  encounter  Medications  . lactated ringers infusion  . cephALEXin (KEFLEX) 250 MG capsule    Sig: Take 250 mg by mouth 4 (four) times daily.     Physical examination BP 115/61   Pulse 69   Temp 97.7 F (36.5 C) (Oral)   Resp 20   Ht  (1.575 m)   Wt 128 lb (58.1 kg)   LMP 11/02/2016 (Within Days) Comment: IUD  SpO2 100%   BMI 23.41 kg/m   General NAD, Conversant  HEENT Atraumatic; Op clear with mmm.  Normo-cephalic. Pupils reactive. Anicteric sclerae  Thyroid/Neck  Smooth without nodularity or enlargement. Normal ROM.  Neck Supple.  Skin No rashes, lesions or ulceration. Normal palpated skin turgor. No nodularity.  Breasts: No masses or discharge.  Symmetric.  No axillary adenopathy.  Lungs: Clear to auscultation.No rales or wheezes. Normal Respiratory effort, no retractions.  Heart: NSR.  No murmurs or rubs appreciated. No periferal edema  Abdomen: Soft.  Non-tender.  No masses.  No HSM. No hernia  Extremities: Moves all appropriately.  Normal ROM for age. No lymphadenopathy.  Neuro: Oriented to PPT.  Normal mood. Normal affect.     Pelvic:   Vulva: Normal appearance.  No lesions.  Vagina: No lesions or abnormalities noted.  Support: Normal pelvic support.  Urethra No masses tenderness or scarring.  Meatus Normal size without lesions or prolapse.  Cervix: Normal ectropion.  No lesions.  Anus: Normal exam.  No lesions.  Perineum: Normal exam.  No lesions.        Bimanual   Uterus: Normal size.  Non-tender.  Mobile.  AV.  Adnexae: No masses.  Non-tender to palpation.  Cul-de-sac: Negative for abnormality.   U/S and xray confirm abdominal IUD  Assessment:   G2P1011 Patient Active Problem List   Diagnosis Date Noted  . Pre-syncope 11/12/2016  . Numbness and tingling 11/12/2016  . Vitamin D deficiency 05/13/2016  . B12 deficiency 05/13/2016  . Mild mitral and aortic regurgitation   . Panic disorder   . ADD (attention deficit disorder) 12/04/2014  . Depression, major, recurrent, moderate (HCC) 12/04/2014  . Mitral valve prolapse 02/24/2011     Plan:   1.  Laparoscopic removal of IUD   Elonda Husky, M.D. 11/22/2016 8:52 AM

## 2016-11-22 NOTE — Anesthesia Procedure Notes (Signed)
Procedure Name: Intubation Date/Time: 11/22/2016 9:16 AM Performed by: Jonna Clark Pre-anesthesia Checklist: Patient identified, Patient being monitored, Timeout performed, Emergency Drugs available and Suction available Patient Re-evaluated:Patient Re-evaluated prior to inductionOxygen Delivery Method: Circle system utilized Preoxygenation: Pre-oxygenation with 100% oxygen Intubation Type: IV induction Ventilation: Mask ventilation without difficulty Laryngoscope Size: Mac and 3 Grade View: Grade I Tube type: Oral Tube size: 7.0 mm Number of attempts: 1 Placement Confirmation: ETT inserted through vocal cords under direct vision,  positive ETCO2 and breath sounds checked- equal and bilateral Secured at: 21 cm Tube secured with: Tape Dental Injury: Teeth and Oropharynx as per pre-operative assessment

## 2016-11-22 NOTE — Progress Notes (Signed)
Dr Noralyn Pick called  Continues with discomfort to left jaw left ear back and left side   Feels like tension  ekg given and versed

## 2016-11-22 NOTE — Transfer of Care (Signed)
Immediate Anesthesia Transfer of Care Note  Patient: Katelyn Whitehead  Procedure(s) Performed: Procedure(s): LAPAROCOPIC INTRAUTERINE DEVICE (IUD) REMOVAL (N/A)  Patient Location: PACU  Anesthesia Type:General  Level of Consciousness: patient cooperative and lethargic  Airway & Oxygen Therapy: Patient Spontanous Breathing and Patient connected to face mask oxygen  Post-op Assessment: Report given to RN and Post -op Vital signs reviewed and stable  Post vital signs: Reviewed and stable  Last Vitals:  Vitals:   11/22/16 0751 11/22/16 1018  BP: 115/61 102/81  Pulse: 69 68  Resp:  12  Temp: 36.5 C 36.7 C    Last Pain:  Vitals:   11/22/16 0751  TempSrc: Oral  PainSc:          Complications: No apparent anesthesia complications

## 2016-11-22 NOTE — Op Note (Signed)
[facility] Cardiology Consultation Note  Patient ID: Katelyn Whitehead, MRN: [id number], DOB/AGE: 11-Aug-1990 26 y.o. Admit date: 11/22/2016   Date of Consult: 11/22/2016 Primary Physician: Tommie Sams, DO Primary Cardiologist: None  Chief Complaint: No chief complaint on file.  Reason for Consult: chest pain  HPI: 26 y.o. female with no evidence of previous cardiovascular history who is had apparent mild mitral regurgitation with normal LV systolic function by echocardiogram in 2015 the Holter monitor with previous history of sinus tachycardia and PVCs who is status post 9:00 to procedure. After anesthesia the patient had left upper chest discomfort which is substernal radiating into her left shoulder which is waxing and waning post surgery. The patient has had a significant improvement of this with time alone and no other symptoms have arisen. Nitroglycerin was given as well and has helped with some of her symptoms 2. The patient has had an EKG showing normal sinus rhythm and no other changes. Hemodynamically the patient is very stable at this time and has had no other heart failure or tachycardic symptoms. The patient has had a repeat EKG still normal at this time several hours later and therefore is at low risk for cardiovascular complication at this time status post procedure  Past Medical History:  Diagnosis Date  . ADHD (attention deficit hyperactivity disorder)   . Anxiety   . Asthma    childhood  . Depression   . Mild mitral and aortic regurgitation    a. 04/2013 Echo: EF 50-55%, mild AI, mild MR, no MVP.  Marland Kitchen Palpitations    a. 2012 Holter: sinus tach, pvc's.  . Panic disorder       Surgical History:  Past Surgical History:  Procedure Laterality Date  . WISDOM TOOTH EXTRACTION       Home Meds: Prior to Admission medications   Medication Sig Start Date End Date Taking? Authorizing Provider  cephALEXin (KEFLEX) 250 MG capsule Take 250 mg by mouth 4 (four) times daily.   Yes  Historical Provider, MD  FLUoxetine (PROZAC) 20 MG tablet Take one or 2 pills qd 10/29/16  Yes Faythe Ghee, PA-C  Prenatal Vit-Fe Fumarate-FA (PRENATAL MULTIVITAMIN) TABS tablet Take 1 tablet by mouth daily at 12 noon. 09/12/16  Yes Hildred Laser, MD  LORazepam (ATIVAN) 1 MG tablet Take 1 tablet (1 mg total) by mouth every 8 (eight) hours. 11/22/16   Linzie Collin, MD  oxyCODONE-acetaminophen (PERCOCET/ROXICET) 5-325 MG tablet Take 1-2 tablets by mouth every 6 (six) hours as needed. 11/22/16   Linzie Collin, MD  Vitamin D, Ergocalciferol, (DRISDOL) 50000 units CAPS capsule Take 1 capsule (50,000 Units total) by mouth every 7 (seven) days. 05/13/16   Melody Suzan Nailer, CNM    Inpatient Medications:  . fentaNYL      . ipratropium-albuterol  3 mL Nebulization Q4H   . lactated ringers      Allergies:  Allergies  Allergen Reactions  . Influenza Vaccines Hives  . Sulfa Antibiotics     Hives Rash    Social History   Social History  . Marital status: Married    Spouse name: N/A  . Number of children: N/A  . Years of education: N/A   Occupational History  . Not on file.   Social History Main Topics  . Smoking status: Never Smoker  . Smokeless tobacco: Never Used  . Alcohol use No  . Drug use: No  . Sexual activity: Yes    Birth control/ protection: None, IUD  Comment: mirena   Other Topics Concern  . Not on file   Social History Narrative  . No narrative on file     Family History  Problem Relation Age of Onset  . Depression Mother   . Drug abuse Father   . ADD / ADHD Brother   . Depression Maternal Aunt   . Dementia Maternal Grandmother   . Depression Maternal Grandmother      Review of Systems Positive for Chest pain Negative for: General:  chills, fever, night sweats or weight changes.  Cardiovascular: PND orthopnea syncope dizziness  Dermatological skin lesions rashes Respiratory: Cough congestion Urologic: Frequent urination urination at night  and hematuria Abdominal: negative for nausea, vomiting, diarrhea, bright red blood per rectum, melena, or hematemesis Neurologic: negative for visual changes, and/or hearing changes  All other systems reviewed and are otherwise negative except as noted above.  Labs: No results for input(s): CKTOTAL, CKMB, TROPONINI in the last 72 hours. Lab Results  Component Value Date   WBC 5.5 11/22/2016   HGB 12.4 11/22/2016   HCT 37.2 11/22/2016   MCV 78.1 (L) 11/22/2016   PLT 311 11/22/2016   No results for input(s): NA, K, CL, CO2, BUN, CREATININE, CALCIUM, PROT, BILITOT, ALKPHOS, ALT, AST, GLUCOSE in the last 168 hours.  Invalid input(s): LABALBU No results found for: CHOL, HDL, LDLCALC, TRIG No results found for: DDIMER  Radiology/Studies:  X-ray Chest Pa Or Ap  Result Date: 11/22/2016 CLINICAL DATA:  Chest and jaw pain.  Status post IUD removal. EXAM: CHEST 1 VIEW COMPARISON:  11/12/2016 FINDINGS: The heart size and mediastinal contours are within normal limits. Both lungs are clear. No evidence of pneumothorax or pleural effusion. The visualized skeletal structures are unremarkable. IMPRESSION: No active disease. Electronically Signed   By: Myles Rosenthal M.D.   On: 11/22/2016 11:47   Dg Chest 2 View  Result Date: 11/12/2016 CLINICAL DATA:  26 year old female with a history of numbness and tingling EXAM: CHEST  2 VIEW COMPARISON:  04/26/2015 FINDINGS: Cardiomediastinal silhouette within normal limits. No pneumothorax or pleural effusion.  No confluent airspace disease. No displaced fracture. IMPRESSION: No radiographic evidence of acute cardiopulmonary disease. Electronically Signed   By: Gilmer Mor D.O.   On: 11/12/2016 17:16   Dg Abd 1 View  Result Date: 11/16/2016 CLINICAL DATA:  Lower abdominal pain. EXAM: ABDOMEN - 1 VIEW COMPARISON:  Radiograph of April 13, 2013. FINDINGS: The bowel gas pattern is normal. Phleboliths are noted in the pelvis. Possible gallstone is seen in right upper  quadrant. Intrauterine device is noted projected over the sacrum in abnormal appearing position. IMPRESSION: Possible gallstone seen in right upper quadrant. No evidence of bowel obstruction or ileus. Intrauterine device is seen projected over the sacrum in possible abnormal position, and potentially may be extrauterine. Further evaluation with ultrasound is recommended. Electronically Signed   By: Lupita Raider, M.D.   On: 11/16/2016 14:08   Mr Brain Wo Contrast  Result Date: 11/12/2016 CLINICAL DATA:  Healing on and off paresthesias and left facial weakness for a few days. Evaluate for CVA or multiple sclerosis. EXAM: MRI HEAD WITHOUT CONTRAST TECHNIQUE: Multiplanar, multiecho pulse sequences of the brain and surrounding structures were obtained without intravenous contrast. COMPARISON:  Head CT 07/30/2016 FINDINGS: Brain: No acute or remote infarction, hemorrhage, hydrocephalus, extra-axial collection or mass lesion. Normal brain volume and white matter appearance. Vascular: Normal flow voids Skull and upper cervical spine: Negative Sinuses/Orbits: Negative IMPRESSION: Normal exam. Electronically Signed   By:  Marnee Spring M.D.   On: 11/12/2016 17:09    EKG: Normal sinus rhythm otherwise normal EKG  Weights: Filed Weights   11/22/16 0737  Weight: 58.1 kg (128 lb)     Physical Exam: Blood pressure (!) 94/59, pulse 89, temperature 98 F (36.7 C), resp. rate (!) 27, height  (1.575 m), weight 58.1 kg (128 lb), last menstrual period 11/02/2016, SpO2 95 %, currently breastfeeding. Body mass index is 23.41 kg/m. General: Well developed, well nourished, in no acute distress. Head eyes ears nose throat: Normocephalic, atraumatic, sclera non-icteric, no xanthomas, nares are without discharge. No apparent thyromegaly and/or mass  Lungs: Normal respiratory effort.  no wheezes, no rales, no rhonchi.  Heart: RRR with normal S1 S2. no murmur gallop, no rub, PMI is normal size and placement,  carotid upstroke normal without bruit, jugular venous pressure is normal Abdomen: Soft, non-tender, non-distended with normoactive bowel sounds. No hepatomegaly. No rebound/guarding. No obvious abdominal masses. Abdominal aorta is normal size without bruit Extremities: No edema. no cyanosis, no clubbing, no ulcers  Peripheral : 2+ bilateral upper extremity pulses, 2+ bilateral femoral pulses, 2+ bilateral dorsal pedal pulse Neuro: Alert and oriented. No facial asymmetry. No focal deficit. Moves all extremities spontaneously. Musculoskeletal: Normal muscle tone without kyphosis Psych:  Responds to questions appropriately with a normal affect.    Assessment: 26 year old female with the known tachycardia palpitations minimal valvular heart disease by previous echocardiogram having left upper chest discomfort now resolving without evidence of myocardial infarction or EKG changes or high risk of cardiovascular complication  Plan: 1. Begin ambulation and postoperative care without restriction at this time following for improvements of chest discomfort which appears not to be cardiovascular in nature at this time 2. No restrictions to rehabilitation and discharged to home 3. Follow-up only if patient has recurrent evidence of significant chest discomfort or escalating symptoms concerning for further cardiovascular disease 4. No further intervention occasional preventricular contractions stable  Signed, Lamar Blinks M.D. [facility] Cardiology 11/22/2016, 1:30 PM

## 2016-11-22 NOTE — Progress Notes (Signed)
Continues to have tension in back left arm left jaw   No nausea and vomiting  No sweating

## 2016-11-22 NOTE — Progress Notes (Signed)
Morphine given for chest discomfort   Family in to visit pt  To obtain CXR

## 2016-11-22 NOTE — Anesthesia Preprocedure Evaluation (Signed)
Anesthesia Evaluation  Patient identified by MRN, date of birth, ID band Patient awake    Reviewed: Allergy & Precautions, NPO status , Patient's Chart, lab work & pertinent test results  Airway Mallampati: II       Dental no notable dental hx.    Pulmonary asthma ,    Pulmonary exam normal        Cardiovascular Normal cardiovascular exam+ Valvular Problems/Murmurs AI and MR      Neuro/Psych Anxiety Depression    GI/Hepatic negative GI ROS, Neg liver ROS,   Endo/Other  negative endocrine ROS  Renal/GU negative Renal ROS     Musculoskeletal negative musculoskeletal ROS (+)   Abdominal Normal abdominal exam  (+)   Peds  Hematology negative hematology ROS (+)   Anesthesia Other Findings   Reproductive/Obstetrics negative OB ROS                             Anesthesia Physical Anesthesia Plan  ASA: II  Anesthesia Plan: General   Post-op Pain Management:    Induction: Intravenous  Airway Management Planned: Oral ETT  Additional Equipment:   Intra-op Plan:   Post-operative Plan: Extubation in OR  Informed Consent: I have reviewed the patients History and Physical, chart, labs and discussed the procedure including the risks, benefits and alternatives for the proposed anesthesia with the patient or authorized representative who has indicated his/her understanding and acceptance.     Plan Discussed with: CRNA and Surgeon  Anesthesia Plan Comments:         Anesthesia Quick Evaluation

## 2016-11-22 NOTE — Discharge Instructions (Signed)

## 2016-11-22 NOTE — Progress Notes (Signed)
Continues with same symptoms    Blood pressure good  Oxygen sat 96  Husband at bedside

## 2016-11-22 NOTE — Progress Notes (Signed)
Tenseness in chest and back  Pt had lap  Encouraged pt to reposition  Dr Noralyn Pick in to check pt

## 2016-11-22 NOTE — Progress Notes (Signed)
NTG sl given per dr Noralyn Pick

## 2016-11-22 NOTE — Anesthesia Post-op Follow-up Note (Cosign Needed)
Anesthesia QCDR form completed.        

## 2016-11-22 NOTE — Progress Notes (Signed)
Dr carroll into see pt  

## 2016-11-22 NOTE — Progress Notes (Signed)
Duoneb given per order

## 2016-11-23 ENCOUNTER — Ambulatory Visit: Payer: Self-pay | Admitting: Family Medicine

## 2016-11-23 DIAGNOSIS — I200 Unstable angina: Secondary | ICD-10-CM | POA: Diagnosis not present

## 2016-11-23 NOTE — Anesthesia Postprocedure Evaluation (Signed)
Anesthesia Post Note  Patient: BERNISE SYLVAIN  Procedure(s) Performed: Procedure(s) (LRB): LAPAROCOPIC INTRAUTERINE DEVICE (IUD) REMOVAL (N/A)  Patient location during evaluation: PACU Anesthesia Type: General Level of consciousness: awake and alert Pain management: pain level controlled Vital Signs Assessment: post-procedure vital signs reviewed and stable Respiratory status: spontaneous breathing Cardiovascular status: blood pressure returned to baseline Anesthetic complications: no Comments: Patient complained of transient Left chest pain radiating into her neck and left upper extremiy after surgery.  EKG and Chest Xray and VSS all normal.  Patient was evaluated by cardiology and felt that the pain was noncardiac in origin.  Most likely the chest pain was due to residual gas from surgery.  Patient felt much better at end of PACU stay.     Last Vitals:  Vitals:   11/22/16 1342 11/22/16 1408  BP: 93/64 97/66  Pulse: 90 88  Resp: 16 16  Temp: 36.7 C     Last Pain:  Vitals:   11/23/16 0842  TempSrc:   PainSc: 0-No pain                 Milly Goggins

## 2016-11-30 ENCOUNTER — Telehealth: Payer: Self-pay | Admitting: Obstetrics and Gynecology

## 2016-11-30 ENCOUNTER — Ambulatory Visit (INDEPENDENT_AMBULATORY_CARE_PROVIDER_SITE_OTHER): Payer: 59 | Admitting: Obstetrics and Gynecology

## 2016-11-30 ENCOUNTER — Encounter: Payer: Self-pay | Admitting: Obstetrics and Gynecology

## 2016-11-30 ENCOUNTER — Other Ambulatory Visit: Payer: Self-pay | Admitting: Obstetrics and Gynecology

## 2016-11-30 ENCOUNTER — Telehealth: Payer: Self-pay | Admitting: Cardiovascular Disease

## 2016-11-30 VITALS — BP 103/68 | HR 116 | Wt 127.4 lb

## 2016-11-30 DIAGNOSIS — Z9889 Other specified postprocedural states: Secondary | ICD-10-CM | POA: Diagnosis not present

## 2016-11-30 DIAGNOSIS — Z30018 Encounter for initial prescription of other contraceptives: Secondary | ICD-10-CM

## 2016-11-30 DIAGNOSIS — R319 Hematuria, unspecified: Secondary | ICD-10-CM

## 2016-11-30 DIAGNOSIS — N390 Urinary tract infection, site not specified: Secondary | ICD-10-CM | POA: Diagnosis not present

## 2016-11-30 DIAGNOSIS — H73892 Other specified disorders of tympanic membrane, left ear: Secondary | ICD-10-CM

## 2016-11-30 LAB — POCT URINALYSIS DIPSTICK
BILIRUBIN UA: NEGATIVE
Glucose, UA: NEGATIVE
KETONES UA: NEGATIVE
Nitrite, UA: POSITIVE
PROTEIN UA: NEGATIVE
Spec Grav, UA: 1.025 (ref 1.010–1.025)
Urobilinogen, UA: NEGATIVE E.U./dL — AB
pH, UA: 5 (ref 5.0–8.0)

## 2016-11-30 MED ORDER — NITROFURANTOIN MONOHYD MACRO 100 MG PO CAPS: 100.0000 mg | ORAL_CAPSULE | Freq: Two times a day (BID) | ORAL | 1 refills | Status: DC

## 2016-11-30 MED ORDER — ETONOGESTREL-ETHINYL ESTRADIOL 0.12-0.015 MG/24HR VA RING: VAGINAL_RING | VAGINAL | 12 refills | Status: DC

## 2016-11-30 NOTE — Telephone Encounter (Signed)
Patient also asked about getting B12 injection was told she needed at hospital per patient

## 2016-11-30 NOTE — Telephone Encounter (Signed)
Pt states she had and EKG performed at [facility] after a surgery she had. She would like for Dr. Mariah Milling to take a look at it. Please call. Pt has an appt 5/4 with Dr. Mariah Milling

## 2016-11-30 NOTE — Telephone Encounter (Signed)
Overall EKG looks okay Minimal changes noted, could be from lead placement

## 2016-11-30 NOTE — Progress Notes (Addendum)
HPI:      [name] is a 26 y.o. G2P1011 who LMP was Patient's last menstrual period was 11/02/2016 (within days).  Subjective:   She presents today 1 week postop from abdominal IUD removal.  She is doing well postop has resumed normal activities. Has no pain. She is requesting some form of birth control. She is breast-feeding full-time. Dysuria which she has had for several weeks despite previous use of Keflex. Complains of left ear pain and daily irregular lightheadedness. Of significant note the room does not spin.    Hx: The following portions of the patient's history were reviewed and updated as appropriate:              She  has a past medical history of ADHD (attention deficit hyperactivity disorder); Anxiety; Asthma; Depression; Mild mitral and aortic regurgitation; Palpitations; and Panic disorder. She  does not have any pertinent problems on file. She  has a past surgical history that includes Wisdom tooth extraction and IUD removal (N/A, 11/22/2016). Her family history includes ADD / ADHD in her brother; Dementia in her maternal grandmother; Depression in her maternal aunt, maternal grandmother, and mother; Drug abuse in her father. She  reports that she has never smoked. She has never used smokeless tobacco. She reports that she does not drink alcohol or use drugs. Current Outpatient Prescriptions on File Prior to Visit  Medication Sig Dispense Refill  . FLUoxetine (PROZAC) 20 MG tablet Take one or 2 pills qd 60 tablet 3  . LORazepam (ATIVAN) 1 MG tablet Take 1 tablet (1 mg total) by mouth every 8 (eight) hours. 30 tablet 0  . Prenatal Vit-Fe Fumarate-FA (PRENATAL MULTIVITAMIN) TABS tablet Take 1 tablet by mouth daily at 12 noon. 60 tablet 3  . Vitamin D, Ergocalciferol, (DRISDOL) 50000 units CAPS capsule Take 1 capsule (50,000 Units total) by mouth every 7 (seven) days. 30 capsule 1   Current Facility-Administered Medications on File Prior to Visit  Medication Dose  Route Frequency Provider Last Rate Last Dose  . pantoprazole (PROTONIX) injection 40 mg  40 mg Intravenous Q24H Melody N Shambley, CNM             Review of Systems:  Review of Systems  Constitutional: Denied constitutional symptoms, night sweats, recent illness, fatigue, fever, insomnia and weight loss.  Eyes: Denied eye symptoms, eye pain, photophobia, vision change and visual disturbance.  Ears/Nose/Throat/Neck: Denied ear, nose, throat or neck symptoms, hearing loss, nasal discharge, sinus congestion and sore throat.  Cardiovascular: Denied cardiovascular symptoms, arrhythmia, chest pain/pressure, edema, exercise intolerance, orthopnea and palpitations.  Respiratory: Denied pulmonary symptoms, asthma, pleuritic pain, productive sputum, cough, dyspnea and wheezing.  Gastrointestinal: Denied, gastro-esophageal reflux, melena, nausea and vomiting.  Genitourinary: Denied genitourinary symptoms including symptomatic vaginal discharge, pelvic relaxation issues, and urinary complaints.  Musculoskeletal: Denied musculoskeletal symptoms, stiffness, swelling, muscle weakness and myalgia.  Dermatologic: Denied dermatology symptoms, rash and scar.  Neurologic: Denied neurology symptoms, dizziness, headache, neck pain and syncope.  Psychiatric: Denied psychiatric symptoms, anxiety and depression.  Endocrine: Denied endocrine symptoms including hot flashes and night sweats.   Meds:   Current Outpatient Prescriptions on File Prior to Visit  Medication Sig Dispense Refill  . FLUoxetine (PROZAC) 20 MG tablet Take one or 2 pills qd 60 tablet 3  . LORazepam (ATIVAN) 1 MG tablet Take 1 tablet (1 mg total) by mouth every 8 (eight) hours. 30 tablet 0  . Prenatal Vit-Fe Fumarate-FA (PRENATAL MULTIVITAMIN) TABS tablet Take 1 tablet by  mouth daily at 12 noon. 60 tablet 3  . Vitamin D, Ergocalciferol, (DRISDOL) 50000 units CAPS capsule Take 1 capsule (50,000 Units total) by mouth every 7 (seven) days. 30  capsule 1   Current Facility-Administered Medications on File Prior to Visit  Medication Dose Route Frequency Provider Last Rate Last Dose  . pantoprazole (PROTONIX) injection 40 mg  40 mg Intravenous Q24H Melody N Shambley, CNM        Objective:     Vitals:   11/30/16 0914  BP: 103/68  Pulse: (!) 116               Abdomen: Soft.  Non-tender.  No masses.  No HSM.  Incision/s: Intact.  Healing well.  No erythema.  No drainage.   Otoscopic examination of ears reveal normal right tympanic membrane with light reflex.  Abnormal examination of left tympanic membrane reveals significant retraction without erythema. Loss of light reflex.  Assessment:    G2P1011 Patient Active Problem List   Diagnosis Date Noted  . Pre-syncope 11/12/2016  . Numbness and tingling 11/12/2016  . Vitamin D deficiency 05/13/2016  . B12 deficiency 05/13/2016  . Mild mitral and aortic regurgitation   . Panic disorder   . ADD (attention deficit disorder) 12/04/2014  . Depression, major, recurrent, moderate (HCC) 12/04/2014  . Mitral valve prolapse 02/24/2011     1. Urinary tract infection with hematuria, site unspecified   2. Post-operative state   3. Retraction of tympanic membrane of left ear   4. Encounter for prescription for nuvaring     Patient doing well postop. No issues.  Desires birth control.  Left tympanic membrane retraction. Likely source of left ear pain although symptoms not consistent with her complaint of lightheadedness.   Plan:            1.  Birth Control I discussed multiple birth control options and methods with the patient.  The risks and benefits of each were reviewed. Breastfeeding and Birth Control Breastfeeding and birth control were discussed in detail.  The patient understands that breastfeeding is not a reliable form of birth control.  We have discussed the use of Progesterone-only birth control pills and combination OCPs.  I have informed her that Progesterone only  pills are safe with breastfeeding and very effective when used in combination with full-time breastfeeding.  I have stressed that when she begins to wean from breastfeeding, Progesterone-only pills become less effective and switching to another form of birth control is recommended.  We have also discussed the use of combination OCPs with breastfeeding.  I have informed her that OCPs are safe with breastfeeding even though a small amount of the drug is carried in breast milk.  We have discussed the fact that combination OCPs can reduce the amount of breast milk produced.  I have made her aware that in rare instances this can lead to discontinuation of breastfeeding.  The use of Mirena IUD was discussed and she has been made aware that this is safe with breastfeeding.  All her questions were answered and she was given appropriate literature on birth control methods. The patient has elected to begin NuvaRing.  The risks /benefits of NuvaRing have been explained to the patient in detail.  Product literature has been given to her.  I have instructed her in the use of NuvaRing and have given her literature reinforcing this information.  I have explained to the patient that NuvaRing is not as effective for birth control during the first  month of use, and that another form of contraception should be employed during this time.  She has been instructed in the insertion and removal of NuvaRing.  I have answered all of her questions, and I believe that she has an understanding of the effectiveness and use of NuvaRing. 2.  Will treat UTI with Macrobid. Cultures sent for sensitivities. 3.  Discussed use of antihistamines and decongestants for eardrum retraction. If no relief patient to see family physician, possibly a referral to ENT.  Orders Orders Placed This Encounter  Procedures  . Urine Culture  . POCT urinalysis dipstick     Meds ordered this encounter  Medications  . etonogestrel-ethinyl estradiol (NUVARING)  0.12-0.015 MG/24HR vaginal ring    Sig: Insert vaginally and leave in place for 3 consecutive weeks, then remove for 1 week.    Dispense:  1 each    Refill:  12  . nitrofurantoin, macrocrystal-monohydrate, (MACROBID) 100 MG capsule    Sig: Take 1 capsule (100 mg total) by mouth 2 (two) times daily.    Dispense:  28 capsule    Refill:  1        F/U  Return in about 6 weeks (around 01/11/2017). I spent 26 minutes with this patient of which greater than 50% was spent discussing her postop condition, and urinary tract infection, eardrum retraction and pain, lightheadedness, forms of birth control.  Elonda Husky, M.D. 11/30/2016 11:12 AM

## 2016-11-30 NOTE — Addendum Note (Signed)
Addended by: Elonda Husky on: 11/30/2016 11:12 AM   Modules accepted: Orders

## 2016-11-30 NOTE — Telephone Encounter (Signed)
Patient is asking about insurance paperwork that she left for Mel to complete about 3 weeks ago Please call

## 2016-11-30 NOTE — Telephone Encounter (Signed)
Pt would like for you to review EKG 11/22/16.

## 2016-12-01 NOTE — Telephone Encounter (Signed)
Patient returning my call. Reviewed Dr. Windell Hummingbird interpretation of EKG, changes that could be from lead placement,  and confirmed upcoming appointment. She was appreciative for the information and had no further questions at this time.

## 2016-12-01 NOTE — Telephone Encounter (Signed)
Left voicemail message to call back  

## 2016-12-02 LAB — URINE CULTURE

## 2016-12-08 ENCOUNTER — Ambulatory Visit: Payer: 59 | Admitting: Physician Assistant

## 2016-12-08 ENCOUNTER — Encounter: Payer: Self-pay | Admitting: Physician Assistant

## 2016-12-08 ENCOUNTER — Telehealth: Payer: Self-pay | Admitting: Obstetrics and Gynecology

## 2016-12-08 ENCOUNTER — Ambulatory Visit: Payer: 59 | Admitting: Psychiatry

## 2016-12-08 VITALS — BP 90/60 | HR 97 | Temp 98.5°F

## 2016-12-08 DIAGNOSIS — R42 Dizziness and giddiness: Secondary | ICD-10-CM

## 2016-12-08 NOTE — Progress Notes (Signed)
Cardiology Office Note  Date:  12/10/2016   ID:  CARNESHIA RAKER, DOB [birth year], MRN [id number]  PCP:  Tommie Sams, DO   CC: palpitations  HPI:  26 year old woman with history of  anxiety,  panic attacks,  palpitations,  presyncope,   previous Holter monitor which showed sinus tachycardia and rare PVCs,   Previous echo showed mild AI and MR  who presents for routine follow-up of her sinus tachycardia and palpitations   in follow-up she reports that she delivered a healthy girl a proximally 3 months ago She has been sleeping with the child on her chest every night  little sleep, child feeds constantly,   She is not using a cramping or bassinet  husband has been of little help managing the child   reports that she stopped Ativan 2 days ago and  periods of tachycardia, dizziness  legs feel weak  EKG personally reviewed by myself on todays visit  shows normal sinus rhythm with rate 78 bpm no significant ST or T-wave changes  echocardiograms done in 2014, 2017 did not confirm mitral valve prolapse, In 2014 had mild MR, AI In 2017 had trace MR, trace AI, normal LV function  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  PSH:    Past Surgical History:  Procedure Laterality Date  . IUD REMOVAL N/A 11/22/2016   Procedure: LAPAROCOPIC INTRAUTERINE DEVICE (IUD) REMOVAL;  Surgeon: Linzie Collin, MD;  Location: [facility] ORS;  Service: Gynecology;  Laterality: N/A;  . WISDOM TOOTH EXTRACTION      Current Outpatient Prescriptions  Medication Sig Dispense Refill  . FLUoxetine (PROZAC) 20 MG tablet Take one or 2 pills qd 60 tablet 3  . nitrofurantoin, macrocrystal-monohydrate, (MACROBID) 100 MG capsule Take 1 capsule (100 mg total) by mouth 2 (two) times  daily. 28 capsule 1  . Prenatal Vit-Fe Fumarate-FA (PRENATAL MULTIVITAMIN) TABS tablet Take 1 tablet by mouth daily at 12 noon. 60 tablet 3  . Vitamin D, Ergocalciferol, (DRISDOL) 50000 units CAPS capsule Take 1 capsule (50,000 Units total) by mouth every 7 (seven) days. 30 capsule 1   Current Facility-Administered Medications  Medication Dose Route Frequency Provider Last Rate Last Dose  . pantoprazole (PROTONIX) injection 40 mg  40 mg Intravenous Q24H Melody N Shambley, CNM         Allergies:   Influenza vaccines and Sulfa antibiotics   Social History:  The patient  reports that she has never smoked. She has never used smokeless tobacco. She reports that she does not drink alcohol or use drugs.   Family History:   family history includes ADD / ADHD in her brother; Dementia in her maternal grandmother; Depression in her maternal aunt, maternal grandmother, and mother; Drug abuse in her father.    Review of Systems: Review of Systems  Constitutional: Negative.   Respiratory: Negative.   Cardiovascular: Negative.   Gastrointestinal: Negative.   Musculoskeletal: Negative.   Neurological: Negative.   Psychiatric/Behavioral: The patient is nervous/anxious.   All other systems reviewed and are negative.    PHYSICAL EXAM: VS:  BP 102/64 (BP Location: Left Arm, Patient Position: Sitting, Cuff Size: Normal)   Pulse 78   Ht  (1.575 m)   Wt 125 lb 4 oz (56.8 kg)  BMI 22.91 kg/m  , BMI Body mass index is 22.91 kg/m. GEN: Well nourished, well developed, in no acute distress ,  Anxious, tearful at times HEENT: normal  Neck: no JVD, carotid bruits, or masses Cardiac: RRR; no murmurs, rubs, or gallops,no edema  Respiratory:  clear to auscultation bilaterally, normal work of breathing GI: soft, nontender, nondistended, + BS MS: no deformity or atrophy  Skin: warm and dry, no rash Neuro:  Strength and sensation are intact Psych: euthymic mood, full affect    Recent  Labs: 11/12/2016: ALT 13; BUN 11; Creatinine, Ser 0.79; Magnesium 1.9; Potassium 3.2; Sodium 140; TSH 0.808 11/22/2016: Hemoglobin 12.4; Platelets 311    Lipid Panel No results found for: CHOL, HDL, LDLCALC, TRIG    Wt Readings from Last 3 Encounters:  12/10/16 125 lb 4 oz (56.8 kg)  11/30/16 127 lb 7 oz (57.8 kg)  11/22/16 128 lb (58.1 kg)       ASSESSMENT AND PLAN:   Sinus tachycardia (HCC)  secondary to anxiety  no further medications needed  long discussion today concerning  Obtaining a better balance in her life,  More time for herself,  Better sleep,  Better nutrition,  Exercise  husband needs to help her obtain this,  Currently  He is minimally helpful  And likely contributing to her stress/anxiety.  he was in the waiting room  With a crying child during her  visit today and called her 6 times during her exam.  He wanted her to take care of the child and did not know what to do.  Episodic paroxysmal anxiety disorder  recommended she follow-up with counselor  strongly recommended she start placing the child in the crib in a separate room she she can get sleep.  Husband needs to help cover the baby at nighttime with bottle  Mild mitral and aortic regurgitation  No further echocardiograms needed  Dizziness Dizziness likely from orthostasis. Recommended she stay hydrated.  possibly exacerbated by anxiety   Total encounter time more than 25 minutes  Greater than 50% was spent in counseling and coordination of care with the patient   Disposition:   F/U as needed   Orders Placed This Encounter  Procedures  . EKG 12-Lead     Signed, Dossie Arbour, M.D., Ph.D. 12/10/2016  [facility] [place], [place] [phone]

## 2016-12-08 NOTE — Progress Notes (Signed)
S: pt states she is really fatigued, doesn't feel well, is tired all of the time, is dizzy when she stands up and turns her head, use to be on b12 but isn't anymore, also is still breastfeeding and can't keep up with the baby, no si/hi; was suppose to see her psychiatrist today but she had an emergency and she couldn't see her, is going to be out of Ativan until next Monday; states that all she does is sit around and worry, worries about why she is tired; also the baby is still in the bed with her all night, sleeps on her chest, breast feeds during the night  O: vitals with low bp 90/60, tms clear, throat wnl, neck supple no lymph, lungs c t a, cv rrr, neuro intact, pt appears pale  A: dizziness, fatigue  P: discussed supplementing with formula so she can regain some energy, discuss b12 and ?anemia with her doctor, f/u with psychiatrist for ativan, will not rx controls for mood d/o from this clinic due to guidelines of the clinic, take otc sublingual b12, put the baby in her crib so she can sleep without constantly checking the child

## 2016-12-08 NOTE — Telephone Encounter (Signed)
Patient had appointment today with Psychiatrist @ [facility]. this appointment was RSd to Monday 12/13/2016 due to MD family emergency  Could you refill the ativan to last until Monday  Pharmacy - [facility]

## 2016-12-09 NOTE — Progress Notes (Signed)
Spoke with Crystal @ [facility] will contact patient back to schedule her appt with Melody Shambley. Patient was informed.

## 2016-12-09 NOTE — Telephone Encounter (Signed)
Dr Logan BoresEvans will not refill ativan - ptietn instructed to go to PCP

## 2016-12-10 ENCOUNTER — Telehealth: Payer: Self-pay

## 2016-12-10 ENCOUNTER — Other Ambulatory Visit: Payer: Self-pay

## 2016-12-10 ENCOUNTER — Ambulatory Visit (INDEPENDENT_AMBULATORY_CARE_PROVIDER_SITE_OTHER): Payer: 59 | Admitting: Cardiovascular Disease

## 2016-12-10 ENCOUNTER — Encounter: Payer: Self-pay | Admitting: Cardiovascular Disease

## 2016-12-10 ENCOUNTER — Other Ambulatory Visit: Payer: Self-pay | Admitting: Obstetrics and Gynecology

## 2016-12-10 VITALS — BP 102/64 | HR 78 | Ht 62.0 in | Wt 125.2 lb

## 2016-12-10 DIAGNOSIS — F410 Panic disorder [episodic paroxysmal anxiety] without agoraphobia: Secondary | ICD-10-CM | POA: Diagnosis not present

## 2016-12-10 DIAGNOSIS — F419 Anxiety disorder, unspecified: Secondary | ICD-10-CM

## 2016-12-10 DIAGNOSIS — R000 Tachycardia, unspecified: Secondary | ICD-10-CM | POA: Diagnosis not present

## 2016-12-10 MED ORDER — LORAZEPAM 1 MG PO TABS: 0.5000 mg | ORAL_TABLET | Freq: Two times a day (BID) | ORAL | 0 refills | Status: DC

## 2016-12-10 NOTE — Telephone Encounter (Signed)
Informed patient script ready for pickup. Patient states she thought it was going to be called in. Informed patient it had to be picked up due to it being a controlled substance.

## 2016-12-10 NOTE — Progress Notes (Signed)
Phone conversation: Patient has been taking Ativan for her anxiety. She believes her anxiety is becoming worse. She has a rescheduled appointment with her psychiatrist on Monday. Her psychiatrist canceled her last appointment because she had a family emergency.  Have discussed Ativan in detail with the patient and we will provide her with enough to get through the next few days until she has a chance to keep her psychiatric appointment.

## 2016-12-10 NOTE — Patient Instructions (Signed)
Medication Instructions:   No medication changes made  Labwork:  No new labs needed  Testing/Procedures:  No further testing at this time   I recommend watching educational videos on topics of interest to you at:       [email or web address]  Enter code: [email or web address]    Follow-Up: It was a pleasure seeing you in the office today. Please call us if you have new issues that need to be addressed before your next appt.  [phone]  Your physician wants you to follow-up in:  As needed  If you need a refill on your cardiac medications before your next appointment, please call your pharmacy.     

## 2016-12-13 ENCOUNTER — Encounter: Payer: Self-pay | Admitting: Psychiatry

## 2016-12-13 ENCOUNTER — Ambulatory Visit (INDEPENDENT_AMBULATORY_CARE_PROVIDER_SITE_OTHER): Payer: 59 | Admitting: Psychiatry

## 2016-12-13 VITALS — BP 107/72 | HR 86 | Temp 97.6°F | Wt 126.0 lb

## 2016-12-13 DIAGNOSIS — F1320 Sedative, hypnotic or anxiolytic dependence, uncomplicated: Secondary | ICD-10-CM

## 2016-12-13 DIAGNOSIS — F410 Panic disorder [episodic paroxysmal anxiety] without agoraphobia: Secondary | ICD-10-CM

## 2016-12-13 NOTE — Progress Notes (Signed)
BH MD/PA/NP OP Progress Note  12/13/2016 9:49 AM Katelyn Whitehead  MRN:  [id number]  Subjective:   Patient is a 26 year old married female who presented for the follow-up appointment accompanied by her husband and baby. She reported that her baby is now 83 months old. Patient reported that she has been following with her OB who has started her back on the Ativan and she is taking 0.5 mg twice daily along with the Prozac. She reported that her symptoms are stable and she is also breast-feeding. Her husband remains supportive. She reported that she is thinking about going to work part-time and her mother-in-law is going to help her. Patient was asking about the FMLA paperwork which was faxed to our office couple of months ago. She reported that the [facility] midwife has filled her apartment and now they're looking for documentation from our office. She reported that she has not been working since September. Patient has missed several of her appointments in the past few months and has been canceling them on a consistent basis and. Patient was also asking for a prescription for benzodiazepines as she reported that she went to the ER recently and got a 10 day supply of the Ativan as she ran out of her prescription.   I checked her [place] controlled registry and it was noted that she received 10 pills of Ativan 1 mg on May 4. Patient reported that she takes half pill twice daily and her OB is fine with her Ativan dosage along with her breast-feeding.   She remain insistent on getting paperwork for FMLA .  Pt reported that she does not want to follow up in this office and would like to find another psychiatrist in [place] .  She currently denied having any suicidal homicidal ideations or plans.         Chief Complaint:  Chief Complaint    Follow-up; Medication Refill     Visit Diagnosis:     ICD-9-CM ICD-10-CM   1. Panic disorder 300.01 F41.0   2. Sedative dependence (HCC) 304.10 F13.20      Past Medical History:  Past Medical History:  Diagnosis Date  . ADHD (attention deficit hyperactivity disorder)   . Anxiety   . Asthma    childhood  . Depression   . Mild mitral and aortic regurgitation    a. 04/2013 Echo: EF 50-55%, mild AI, mild MR, no MVP.  Marland Kitchen Palpitations    a. 2012 Holter: sinus tach, pvc's.  . Panic disorder     Past Surgical History:  Procedure Laterality Date  . IUD REMOVAL N/A 11/22/2016   Procedure: LAPAROCOPIC INTRAUTERINE DEVICE (IUD) REMOVAL;  Surgeon: Linzie Collin, MD;  Location: [facility] ORS;  Service: Gynecology;  Laterality: N/A;  . WISDOM TOOTH EXTRACTION     Family History:  Family History  Problem Relation Age of Onset  . Depression Mother   . Drug abuse Father   . ADD / ADHD Brother   . Depression Maternal Aunt   . Dementia Maternal Grandmother   . Depression Maternal Grandmother    Social History:  Social History   Social History  . Marital status: Married    Spouse name: N/A  . Number of children: N/A  . Years of education: N/A   Social History Main Topics  . Smoking status: Never Smoker  . Smokeless tobacco: Never Used  . Alcohol use No  . Drug use: No  . Sexual activity: Yes    Birth control/  protection: None, Pill     Comment: mirena   Other Topics Concern  . None   Social History Narrative  . None   Additional History:  Currently married and lives with her husband and has good relationship with him.  Assessment:   Musculoskeletal: Strength & Muscle Tone: within normal limits Gait & Station: normal Patient leans: N/A  Psychiatric Specialty Exam: Medication Refill   Anxiety     Depression         Past medical history includes anxiety.     Review of Systems  Psychiatric/Behavioral: Positive for depression.    Blood pressure 107/72, pulse 86, temperature 97.6 F (36.4 C), temperature source Oral, weight 126 lb (57.2 kg), currently breastfeeding.Body mass index is 23.05 kg/m.  General  Appearance: Casual and Fairly Groomed  Eye Contact:  Fair  Speech:  Clear and Coherent  Volume:  Normal  Mood:  Euthymic  Affect:  Congruent and Full Range  Thought Process:  Coherent  Orientation:  Full (Time, Place, and Person)  Thought Content:  WDL  Suicidal Thoughts:  No  Homicidal Thoughts:  No  Memory:  Immediate;   Fair  Judgement:  Fair  Insight:  Fair  Psychomotor Activity:  Normal  Concentration:  Fair  Recall:  [employer] of Knowledge: Fair  Language: Fair  Akathisia:  No  Handed:  Right  AIMS (if indicated):    Assets:  Communication Skills Desire for Improvement Physical Health  ADL's:  Intact  Cognition: WNL  Sleep:  7   Is the patient at risk to self?  No. Has the patient been a risk to self in the past 6 months?  No. Has the patient been a risk to self within the distant past?  No. Is the patient a risk to others?  No. Has the patient been a risk to others in the past 6 months?  No. Has the patient been a risk to others within the distant past?  No.  Current Medications: Current Outpatient Prescriptions  Medication Sig Dispense Refill  . FLUoxetine (PROZAC) 20 MG tablet Take one or 2 pills qd 60 tablet 3  . LORazepam (ATIVAN) 1 MG tablet Take 0.5 tablets (0.5 mg total) by mouth 2 (two) times daily. Cut pills in half 10 tablet 0  . nitrofurantoin, macrocrystal-monohydrate, (MACROBID) 100 MG capsule Take 1 capsule (100 mg total) by mouth 2 (two) times daily. 28 capsule 1  . Prenatal Vit-Fe Fumarate-FA (PRENATAL MULTIVITAMIN) TABS tablet Take 1 tablet by mouth daily at 12 noon. 60 tablet 3  . Vitamin D, Ergocalciferol, (DRISDOL) 50000 units CAPS capsule Take 1 capsule (50,000 Units total) by mouth every 7 (seven) days. 30 capsule 1   Current Facility-Administered Medications  Medication Dose Route Frequency Provider Last Rate Last Dose  . pantoprazole (PROTONIX) injection 40 mg  40 mg Intravenous Q24H Purcell Nails, CNM        Medical Decision  Making:  Established Problem, Stable/Improving (1) and Review of Psycho-Social Stressors (1)  Treatment Plan Summary:Medication management   Discussed with patient what the medications treatment risks benefits and alternatives.  Patient will continue on her medications including Prozac and Ativan as prescribed by her OB. She does not take the refill at this time as she reported that her OB Dr. Minerva Areola is continuing with her medications and she does not want to get the medications from her practice.  She is only looking for the FMLA paperwork to be filled out.  Advised patient that  we will contact her regarding the FMLA.  She does not want to make any follow-up appointments at this time   This note was generated in part or whole with voice recognition software. Voice regonition is usually quite accurate but there are transcription errors that can and very often do occur. I apologize for any typographical errors that were not detected and corrected.    Brandy HaleUzma Florian Chauca, MD  12/13/2016, 9:49 AM

## 2016-12-14 ENCOUNTER — Encounter: Payer: Self-pay | Admitting: Obstetrics and Gynecology

## 2016-12-14 ENCOUNTER — Ambulatory Visit (INDEPENDENT_AMBULATORY_CARE_PROVIDER_SITE_OTHER): Payer: 59 | Admitting: Obstetrics and Gynecology

## 2016-12-14 VITALS — BP 106/69 | HR 74 | Ht 62.0 in | Wt 125.2 lb

## 2016-12-14 DIAGNOSIS — F419 Anxiety disorder, unspecified: Secondary | ICD-10-CM

## 2016-12-14 DIAGNOSIS — Z30011 Encounter for initial prescription of contraceptive pills: Secondary | ICD-10-CM | POA: Diagnosis not present

## 2016-12-14 DIAGNOSIS — Z3009 Encounter for other general counseling and advice on contraception: Secondary | ICD-10-CM | POA: Diagnosis not present

## 2016-12-14 DIAGNOSIS — N3001 Acute cystitis with hematuria: Secondary | ICD-10-CM | POA: Diagnosis not present

## 2016-12-14 MED ORDER — NORETHINDRONE 0.35 MG PO TABS: 1.0000 | ORAL_TABLET | Freq: Every day | ORAL | 11 refills | Status: DC

## 2016-12-14 MED ORDER — LORAZEPAM 1 MG PO TABS: 0.5000 mg | ORAL_TABLET | Freq: Two times a day (BID) | ORAL | 0 refills | Status: DC

## 2016-12-14 NOTE — Addendum Note (Signed)
Addended by: Brooke DareSICK, Layson Bertsch L on: 12/14/2016 01:41 PM   Modules accepted: Orders

## 2016-12-14 NOTE — Addendum Note (Signed)
Addended by: Marchelle FolksMILLER, Ananth Fiallos G on: 12/14/2016 01:44 PM   Modules accepted: Orders

## 2016-12-14 NOTE — Telephone Encounter (Signed)
havent seen any paper work on this pt

## 2016-12-14 NOTE — Addendum Note (Signed)
Addended by: Elonda HuskyEVANS, Ema Hebner J on: 12/14/2016 01:42 PM   Modules accepted: Orders

## 2016-12-14 NOTE — Progress Notes (Signed)
HPI:      [name] is a 26 y.o. G2P1011 who LMP was Patient's last menstrual period was 12/10/2016 (exact date).  Subjective:   She presents today With several issues in follow-up. She is taking antibiotics for her UTI and has no symptoms. Currently has vaginal bleeding consistent with menstrual period. She is breast-feeding. She desires progesterone only birth control pill while breast-feeding. She has significant anxiety and had an appointment with a psychiatrist this week which "didn't go well and her problems were not addressed."  Following her complaint she has an appointment with a different psychiatrist "within 1 week".  She is currently taking Paxil and Ativan intermittently for her anxiety.    Hx: The following portions of the patient's history were reviewed and updated as appropriate:              She  has a past medical history of ADHD (attention deficit hyperactivity disorder); Anxiety; Asthma; Depression; Mild mitral and aortic regurgitation; Palpitations; and Panic disorder. She  does not have any pertinent problems on file. She  has a past surgical history that includes Wisdom tooth extraction and IUD removal (N/A, 11/22/2016). Her family history includes ADD / ADHD in her brother; Dementia in her maternal grandmother; Depression in her maternal aunt, maternal grandmother, and mother; Drug abuse in her father. She  reports that she has never smoked. She has never used smokeless tobacco. She reports that she does not drink alcohol or use drugs. Current Outpatient Prescriptions on File Prior to Visit  Medication Sig Dispense Refill  . FLUoxetine (PROZAC) 20 MG tablet Take one or 2 pills qd 60 tablet 3  . nitrofurantoin, macrocrystal-monohydrate, (MACROBID) 100 MG capsule Take 1 capsule (100 mg total) by mouth 2 (two) times daily. 28 capsule 1  . Prenatal Vit-Fe Fumarate-FA (PRENATAL MULTIVITAMIN) TABS tablet Take 1 tablet by mouth daily at 12 noon. 60 tablet 3  .  Vitamin D, Ergocalciferol, (DRISDOL) 50000 units CAPS capsule Take 1 capsule (50,000 Units total) by mouth every 7 (seven) days. 30 capsule 1   Current Facility-Administered Medications on File Prior to Visit  Medication Dose Route Frequency Provider Last Rate Last Dose  . pantoprazole (PROTONIX) injection 40 mg  40 mg Intravenous Q24H Shambley, Melody N, CNM             Review of Systems:  Review of Systems  Constitutional: Denied constitutional symptoms, night sweats, recent illness, fatigue, fever, insomnia and weight loss.  Eyes: Denied eye symptoms, eye pain, photophobia, vision change and visual disturbance.  Ears/Nose/Throat/Neck: Denied ear, nose, throat or neck symptoms, hearing loss, nasal discharge, sinus congestion and sore throat.  Cardiovascular: Denied cardiovascular symptoms, arrhythmia, chest pain/pressure, edema, exercise intolerance, orthopnea and palpitations.  Respiratory: Denied pulmonary symptoms, asthma, pleuritic pain, productive sputum, cough, dyspnea and wheezing.  Gastrointestinal: Denied, gastro-esophageal reflux, melena, nausea and vomiting.  Genitourinary: Denied genitourinary symptoms including symptomatic vaginal discharge, pelvic relaxation issues, and urinary complaints.  Musculoskeletal: Denied musculoskeletal symptoms, stiffness, swelling, muscle weakness and myalgia.  Dermatologic: Denied dermatology symptoms, rash and scar.  Neurologic: Denied neurology symptoms, dizziness, headache, neck pain and syncope.  Psychiatric: Denied psychiatric symptoms, anxiety and depression.  Endocrine: Denied endocrine symptoms including hot flashes and night sweats.   Meds:   Current Outpatient Prescriptions on File Prior to Visit  Medication Sig Dispense Refill  . FLUoxetine (PROZAC) 20 MG tablet Take one or 2 pills qd 60 tablet 3  . nitrofurantoin, macrocrystal-monohydrate, (MACROBID) 100 MG capsule Take 1  capsule (100 mg total) by mouth 2 (two) times daily. 28  capsule 1  . Prenatal Vit-Fe Fumarate-FA (PRENATAL MULTIVITAMIN) TABS tablet Take 1 tablet by mouth daily at 12 noon. 60 tablet 3  . Vitamin D, Ergocalciferol, (DRISDOL) 50000 units CAPS capsule Take 1 capsule (50,000 Units total) by mouth every 7 (seven) days. 30 capsule 1   Current Facility-Administered Medications on File Prior to Visit  Medication Dose Route Frequency Provider Last Rate Last Dose  . pantoprazole (PROTONIX) injection 40 mg  40 mg Intravenous Q24H Shambley, Melody N, CNM        Objective:     Vitals:   12/14/16 1010  BP: 106/69  Pulse: 74              UA shows blood but otherwise normal.  Assessment:    G2P1011 Patient Active Problem List   Diagnosis Date Noted  . Pre-syncope 11/12/2016  . Numbness and tingling 11/12/2016  . Vitamin D deficiency 05/13/2016  . B12 deficiency 05/13/2016  . Mild mitral and aortic regurgitation   . Panic disorder   . ADD (attention deficit disorder) 12/04/2014  . Depression, major, recurrent, moderate (HCC) 12/04/2014  . Mitral valve prolapse 02/24/2011     1. Acute cystitis with hematuria   2. Birth control counseling   3. Initiation of OCP (BCP)   4. Anxiety     Cystitis likely resolved.  Patient desires progesterone only birth control pill while breast-feeding.  Patient to see psychiatrist within 1 week for anxiety issues. Expect change in medication at that visit.   Plan:            1.  Breastfeeding and Birth Control Breastfeeding and birth control were discussed in detail.  The patient understands that breastfeeding is not a reliable form of birth control.  We have discussed the use of Progesterone-only birth control pills and combination OCPs.  I have informed her that Progesterone only pills are safe with breastfeeding and very effective when used in combination with full-time breastfeeding.  I have stressed that when she begins to wean from breastfeeding, Progesterone-only pills become less effective and  switching to another form of birth control is recommended.  We have also discussed the use of combination OCPs with breastfeeding.  I have informed her that OCPs are safe with breastfeeding even though a small amount of the drug is carried in breast milk.  We have discussed the fact that combination OCPs can reduce the amount of breast milk produced.  I have made her aware that in rare instances this can lead to discontinuation of breastfeeding.  The use of Mirena IUD was discussed and she has been made aware that this is safe with breastfeeding.  All her questions were answered and she was given appropriate literature on birth control methods. She would like to start Micronor. 2.  Anxiety-patient to see psychiatrist next week. 3.  Complete course of Macrobid for UTI. Orders    Meds ordered this encounter  Medications  . LORazepam (ATIVAN) 1 MG tablet    Sig: Take 0.5 tablets (0.5 mg total) by mouth 2 (two) times daily. Cut pills in half    Dispense:  10 tablet    Refill:  0  . norethindrone (MICRONOR,CAMILA,ERRIN) 0.35 MG tablet    Sig: Take 1 tablet (0.35 mg total) by mouth daily.    Dispense:  1 Package    Refill:  11        F/U  Return in about 3  months (around 03/16/2017). I spent 27 minutes with this patient of which greater than 50% was spent discussing depression and anxiety, UTI, birth control and breast-feeding.  Elonda Husky, M.D. 12/14/2016 11:16 AM

## 2016-12-16 ENCOUNTER — Ambulatory Visit (INDEPENDENT_AMBULATORY_CARE_PROVIDER_SITE_OTHER): Payer: 59 | Admitting: Family Medicine

## 2016-12-16 ENCOUNTER — Telehealth: Payer: Self-pay | Admitting: Pharmacist

## 2016-12-16 ENCOUNTER — Encounter: Payer: Self-pay | Admitting: Family Medicine

## 2016-12-16 DIAGNOSIS — F331 Major depressive disorder, recurrent, moderate: Secondary | ICD-10-CM | POA: Diagnosis not present

## 2016-12-16 DIAGNOSIS — R42 Dizziness and giddiness: Secondary | ICD-10-CM | POA: Insufficient documentation

## 2016-12-16 MED ORDER — VORTIOXETINE HBR 10 MG PO TABS: 10.0000 mg | ORAL_TABLET | Freq: Every day | ORAL | 0 refills | Status: DC

## 2016-12-16 NOTE — Telephone Encounter (Addendum)
Received call from Dr Cook regarding safety of Trintellix in breastfeeding.  It is unknown if Trintellix is excreted intoAdriana Whitehead breast milk and risk/benefit should be considered.  Called patient to discuss Trintellix and have left voicemail for her to return my call.  She is to Stop Fluoxetine and to start Trintellix in 4 days.  She has previously been on fluoxetine, citalopram, vilazodone, vilazodone, venlafaxine, and sertraline for depression.    Discussed risk vs benefit of Trintellix with mother.  Instructed her that it is unknown if Trintellix is excreted into breast milk and discussed monitoring for changes with crying, mood, sleep, bowel habits, feeding and behavior with her infant and to call Dr Shiela Mayerooks office if her child has any of these.  Patient demonstrated understanding and plans to call if she sees any changes with her child.    Hazle NordmannKelsy Combs, PharmD, BCPS [facility] PGY2 Pharmacy Resident [phone]

## 2016-12-16 NOTE — Assessment & Plan Note (Addendum)
Worsening. We had a lengthy discussion today. She is sleep deprived. She is feeding her 8659-month-old every 2 hours or less and the baby is sleeping on her chest at night. I advised her to put her in a bassinet in her room (back to sleep). Advised to consider pumping and allowing her husband to feed the baby at night. Advised tofeedings as her 4759-month-old no longer needs to eat every 2 hours. Needs some help at home. Husband is not contributing. Patient wants to switch her medication. Stopping Prozac and starting Trintellix (discussed with PharmD; okay to just stop Prozac and start new medication in the next few days). Pharm D to discuss regarding breastfeeding. F/U in 6 weeks.

## 2016-12-16 NOTE — Progress Notes (Signed)
Subjective:  Patient ID: Katelyn Whitehead, female    DOB: 1990/09/19  Age: 26 y.o. MRN: [id number]  CC: Follow up  HPI:  26 year old female with depression, panic disorder presents for follow-up.  Patient states that her numbness and tingling is markedly improved.  She currently complains of dizziness particularly with certain head movements, most notably with bending down in sitting up. She feels like she has allergies. She is very concerned about this.  Additionally, patient states that she continues to have significant anxiety. She also states that she feels like her depression is not well controlled. She states that she often feels very down. She has been seeing psychiatry but no longer wishes to do so. She is currently on Prozac which she feels does not work. She has tried Celexa, Zoloft, and Paxil in the past. Her anxiety responds quite well to Ativan. She would like to discuss medication changes today regarding depression/anxiety.  Social Hx   Social History   Social History  . Marital status: Married    Spouse name: N/A  . Number of children: N/A  . Years of education: N/A   Social History Main Topics  . Smoking status: Never Smoker  . Smokeless tobacco: Never Used  . Alcohol use No  . Drug use: No  . Sexual activity: Yes    Birth control/ protection: None, Pill     Comment: mirena   Other Topics Concern  . None   Social History Narrative  . None    Review of Systems  Neurological: Positive for dizziness.  Psychiatric/Behavioral: The patient is nervous/anxious.    Objective:  BP 98/68 (BP Location: Right Arm, Patient Position: Sitting, Cuff Size: Normal)   Pulse 96   Temp 97.7 F (36.5 C) (Oral)   Resp 16   Wt 126 lb 2 oz (57.2 kg)   LMP 12/10/2016 (Exact Date)   SpO2 98%   BMI 23.07 kg/m   BP/Weight 12/16/2016 12/14/2016 12/13/2016  Systolic BP 98 106 107  Diastolic BP 68 69 72  Wt. (Lbs) 126.13 125.19 126  BMI 23.07 22.9 23.05   Physical Exam    Constitutional: She is oriented to person, place, and time. She appears well-developed. No distress.  Cardiovascular: Normal rate and regular rhythm.   Pulmonary/Chest: Effort normal and breath sounds normal. She has no wheezes. She has no rales.  Neurological: She is alert and oriented to person, place, and time.  Psychiatric:  Anxious, perseverative.  Vitals reviewed.   Lab Results  Component Value Date   WBC 5.5 11/22/2016   HGB 12.4 11/22/2016   HCT 37.2 11/22/2016   PLT 311 11/22/2016   GLUCOSE 93 11/12/2016   ALT 13 (L) 11/12/2016   AST 17 11/12/2016   NA 140 11/12/2016   K 3.2 (L) 11/12/2016   CL 103 11/12/2016   CREATININE 0.79 11/12/2016   BUN 11 11/12/2016   CO2 30 11/12/2016   TSH 0.808 11/12/2016    Assessment & Plan:   Problem List Items Addressed This Visit    Depression, major, recurrent, moderate (HCC)    Worsening. We had a lengthy discussion today. She is sleep deprived. She is feeding her 5927-month-old every 2 hours or less and the baby is sleeping on her chest at night. I advised her to put her in a bassinet in her room (back to sleep). Advised to consider pumping and allowing her husband to feed the baby at night. Advised tofeedings as her 3627-month-old no longer needs to eat  every 2 hours. Needs some help at home. Husband is not contributing. Patient wants to switch her medication. Stopping Prozac and starting Trintellix (discussed with PharmD; okay to just stop Prozac and start new medication in the next few days). F/U in 6 weeks.      Relevant Medications   vortioxetine HBr (TRINTELLIX) 10 MG TABS   Dizziness    New problem. Has seen cardiology. Suspect orthostasis. Likely cyclic component as well. Advised hydration (and lifestyle changes).        Meds ordered this encounter  Medications  . vortioxetine HBr (TRINTELLIX) 10 MG TABS    Sig: Take 1 tablet (10 mg total) by mouth daily.    Dispense:  56 tablet    Refill:  0   Follow-up: Return  in about 6 weeks (around 01/27/2017).  Katelyn Whitehead Other DO [facility]

## 2016-12-16 NOTE — Patient Instructions (Signed)
Start the trintellix in 2-3 days.  Stop prozac.  Follow up in 6 weeks  Take care  Dr. Adriana Simasook

## 2016-12-16 NOTE — Assessment & Plan Note (Signed)
New problem. Has seen cardiology. Suspect orthostasis. Likely cyclic component as well. Advised hydration (and lifestyle changes).

## 2016-12-17 ENCOUNTER — Telehealth: Payer: Self-pay | Admitting: Family Medicine

## 2016-12-17 DIAGNOSIS — Z3009 Encounter for other general counseling and advice on contraception: Secondary | ICD-10-CM

## 2016-12-17 DIAGNOSIS — F419 Anxiety disorder, unspecified: Secondary | ICD-10-CM

## 2016-12-17 NOTE — Telephone Encounter (Signed)
Yes. We can refill.

## 2016-12-17 NOTE — Telephone Encounter (Signed)
Pt called about needing a refill pt states that Dr Adriana Simasook would take over her Rx until she found a psychiatrist.  LORazepam (ATIVAN) 1 MG tablet.  [phone] - [place], [place] - [address]  Call pt @ [phone]. Thank you!

## 2016-12-17 NOTE — Telephone Encounter (Signed)
Ok to fill lorazepam patient stated you would approve until she finds psychiatrist?

## 2016-12-20 MED ORDER — LORAZEPAM 1 MG PO TABS: 0.5000 mg | ORAL_TABLET | Freq: Two times a day (BID) | ORAL | 0 refills | Status: DC

## 2016-12-20 NOTE — Telephone Encounter (Signed)
Refill placed for PCP signature.

## 2016-12-21 ENCOUNTER — Telehealth: Payer: Self-pay | Admitting: Cardiovascular Disease

## 2016-12-21 DIAGNOSIS — R0602 Shortness of breath: Secondary | ICD-10-CM

## 2016-12-21 NOTE — Telephone Encounter (Signed)
Pt states her shortness of breath has increased. States she has taken her anxiety medication, and that is better, but she would like to request an echo. States she cannot lay flat and be able to take a deep breath. Please call.

## 2016-12-21 NOTE — Telephone Encounter (Signed)
Are you ok w/ me ordering her an ECHO to ease her mind?

## 2016-12-23 ENCOUNTER — Telehealth: Payer: Self-pay | Admitting: Family Medicine

## 2016-12-23 ENCOUNTER — Other Ambulatory Visit: Payer: 59

## 2016-12-23 NOTE — Telephone Encounter (Signed)
Appointment already scheduled.

## 2016-12-23 NOTE — Telephone Encounter (Signed)
Pt called and stated that she thinks that she has another uti. Her symptoms include low back pain, frequent urination, and low grade fever. No available spots. Please advise, thank you!  Call py [phone]

## 2016-12-23 NOTE — Telephone Encounter (Signed)
See below message . Can you she come in and leave urine specimen . Please advise.

## 2016-12-23 NOTE — Telephone Encounter (Signed)
Pt has some questions about her LORazepam (ATIVAN) 1 MG tablet. Please call her at [phone]. She also wants a refill on this medication.

## 2016-12-23 NOTE — Telephone Encounter (Signed)
She has had echo 3 dating back to 2012 Suspect her echo will be normal If she is concerned about fluid retention causing her symptoms, would consider BNP instead If she insists, echo okay for shortness of breath

## 2016-12-23 NOTE — Telephone Encounter (Signed)
No answer. Left message to call back.   

## 2016-12-23 NOTE — Telephone Encounter (Signed)
She doesn't have to be seen.  I can alter the Rx if she likes.

## 2016-12-23 NOTE — Telephone Encounter (Signed)
Can given specimen.

## 2016-12-23 NOTE — Telephone Encounter (Signed)
Unable to leave voicemail due to mailbox being full.

## 2016-12-23 NOTE — Telephone Encounter (Signed)
Pt called back and stated she was taking 1 mg twice daily one in the morning and 1 at night. Pt stated that this was helping her with her anxiety. Pt was told tomorrow for a urine but was placed on schedule for today 12/23/16. Please place lab orders for a POCT DIP. Can pt be double booked for the 2 p.m spot?

## 2016-12-23 NOTE — Telephone Encounter (Signed)
She would like for it to read 1 mg twice daily.

## 2016-12-23 NOTE — Telephone Encounter (Signed)
Scheduled, thanks!

## 2016-12-24 ENCOUNTER — Other Ambulatory Visit (INDEPENDENT_AMBULATORY_CARE_PROVIDER_SITE_OTHER): Payer: 59

## 2016-12-24 ENCOUNTER — Telehealth: Payer: Self-pay | Admitting: Radiology

## 2016-12-24 ENCOUNTER — Other Ambulatory Visit: Payer: Self-pay

## 2016-12-24 ENCOUNTER — Other Ambulatory Visit: Payer: Self-pay | Admitting: Family Medicine

## 2016-12-24 DIAGNOSIS — R0602 Shortness of breath: Secondary | ICD-10-CM

## 2016-12-24 DIAGNOSIS — F419 Anxiety disorder, unspecified: Secondary | ICD-10-CM

## 2016-12-24 DIAGNOSIS — R300 Dysuria: Secondary | ICD-10-CM | POA: Diagnosis not present

## 2016-12-24 LAB — POCT URINALYSIS DIPSTICK
Bilirubin, UA: NEGATIVE
Glucose, UA: NEGATIVE
KETONES UA: NEGATIVE
Nitrite, UA: NEGATIVE
PROTEIN UA: 30
SPEC GRAV UA: 1.02 (ref 1.010–1.025)
Urobilinogen, UA: 0.2 E.U./dL
pH, UA: 6 (ref 5.0–8.0)

## 2016-12-24 MED ORDER — CEPHALEXIN 500 MG PO CAPS: 500.0000 mg | ORAL_CAPSULE | Freq: Two times a day (BID) | ORAL | 0 refills | Status: DC

## 2016-12-24 MED ORDER — LORAZEPAM 1 MG PO TABS: 1.0000 mg | ORAL_TABLET | Freq: Two times a day (BID) | ORAL | 0 refills | Status: DC | PRN

## 2016-12-24 NOTE — Telephone Encounter (Signed)
Please advise what orders you need, thanks,

## 2016-12-24 NOTE — Telephone Encounter (Signed)
Patient needs to use this sparingly. It is excreted in breast milk. If she is going to continue this and the Trintellix she should consider formula feeding.

## 2016-12-24 NOTE — Telephone Encounter (Signed)
pts new rx was faxed.

## 2016-12-24 NOTE — Addendum Note (Signed)
Addended by: Penne LashWIGGINS, Lindey Renzulli N on: 12/24/2016 03:24 PM   Modules accepted: Orders

## 2016-12-24 NOTE — Telephone Encounter (Signed)
Spoke w/ pt.  Advised her of Dr. Windell HummingbirdGollan's recommendation. She is agreeable to having BNP drawn and see if she needs an ECHO. She is unsure if her SOB is from anxiety or from her heart. Call transferred to scheduling to set up lab appt.

## 2016-12-24 NOTE — Telephone Encounter (Signed)
Pt was advised of physicians comments and stated she would think about it.

## 2016-12-24 NOTE — Addendum Note (Signed)
Addended by: Warden FillersWRIGHT, Talayeh Bruinsma S on: 12/24/2016 02:20 PM   Modules accepted: Orders

## 2016-12-24 NOTE — Telephone Encounter (Signed)
LVTCB please transfer call to me.

## 2016-12-24 NOTE — Telephone Encounter (Signed)
Patient has been taking 1mg  a day, leading up to her running out of medication on Sunday, pharmacy can not refill script until Wednesday.  Pt contact [phone]

## 2016-12-24 NOTE — Telephone Encounter (Signed)
PT was rescheduled for today but no order has been placed for POC UA.

## 2016-12-24 NOTE — Telephone Encounter (Signed)
Pt returning our call °Please call back ° °

## 2016-12-25 ENCOUNTER — Telehealth: Payer: 59 | Admitting: Nurse Practitioner

## 2016-12-25 ENCOUNTER — Encounter: Payer: Self-pay | Admitting: Family Medicine

## 2016-12-25 DIAGNOSIS — N3000 Acute cystitis without hematuria: Secondary | ICD-10-CM | POA: Diagnosis not present

## 2016-12-25 LAB — BRAIN NATRIURETIC PEPTIDE: BNP: 17.6 pg/mL (ref 0.0–100.0)

## 2016-12-25 NOTE — Progress Notes (Signed)
We are sorry that you are not feeling well.  Here is how we plan to help!  Based on what you shared with me it looks like you most likely have a simple urinary tract infection.  A UTI (Urinary Tract Infection) is a bacterial infection of the bladder.  Most cases of urinary tract infections are simple to treat but a key part of your care is to encourage you to drink plenty of fluids and watch your symptoms carefully.   Ihave not  prescribed anything. According to chart keflex was sent in for you yesterday by a Dr. Adriana Simasook. That should take care of your uti. The protein in your urine could come form several different things- will need to see your PCP for repeat urinalysis.    Your symptoms should gradually improve. Call us if the burning in your urine worsens, you develop worsening fever, back pain or pelvic pain or if your symptoms do not resolve after completing the antibiotic.  Urinary tract infections can be prevented by drinking plenty of water to keep your body hydrated.  Also be sure when you wipe, wipe from front to back and don't hold it in!  If possible, empty your bladder every 4 hours.  Your e-visit answers were reviewed by a board certified advanced clinical practitioner to complete your personal care plan.  Depending on the condition, your plan could have included both over the counter or prescription medications.  If there is a problem please reply  once you have received a response from your provider.  Your safety is important to us.  If you have drug allergies check your prescription carefully.    You can use MyChart to ask questions about today's visit, request a non-urgent call back, or ask for a work or school excuse for 24 hours related to this e-Visit. If it has been greater than 24 hours you will need to follow up with your provider, or enter a new e-Visit to address those concerns.   You will get an e-mail in the next two days asking about your experience.  I hope that your  e-visit has been valuable and will speed your recovery. Thank you for using e-visits.

## 2016-12-27 ENCOUNTER — Other Ambulatory Visit: Payer: 59

## 2016-12-27 ENCOUNTER — Telehealth: Payer: Self-pay | Admitting: Family Medicine

## 2016-12-27 NOTE — Telephone Encounter (Signed)
Per patient will be out of meds, can she go without it ?

## 2016-12-27 NOTE — Telephone Encounter (Signed)
Patient has gotten medication from several providers which prompted alert from pharmacy.  She needs to come in to discuss.

## 2016-12-27 NOTE — Telephone Encounter (Signed)
Please advise, thanks.

## 2016-12-27 NOTE — Telephone Encounter (Signed)
Pt called and asked if Dr. Adriana Simasook could refer her to another physiatrist. Pt states that her anxiety is really bad. Please advise, thank you!

## 2016-12-27 NOTE — Telephone Encounter (Signed)
0.5 mg twice daily was given (10 days). It should get her to appt.

## 2016-12-27 NOTE — Telephone Encounter (Signed)
Yes, needs an appt, thanks

## 2016-12-27 NOTE — Telephone Encounter (Signed)
Pt called requesting a refill on her LORazepam (ATIVAN) 1 MG tablet but pt states that the pharmacy said that she needs to come in for an appt. Pt wanted to check to see if she really did need to come in. Please advise, thank you!  Call pt @ [phone]

## 2016-12-27 NOTE — Telephone Encounter (Signed)
Pt is scheduled for 5/24 @ 4pm. Pt will have no more medication. Will it be ok if she stops taking. Please advise, thank you!

## 2016-12-28 LAB — URINE CULTURE

## 2016-12-28 NOTE — Telephone Encounter (Signed)
Spoke with pharmacist and they stated Dr. Adriana Simasook called pharmacy and cancel her Lorazepam due to she needed an OV.

## 2016-12-28 NOTE — Telephone Encounter (Signed)
She needs to wait until her appt. Regarding the diarrhea, she can stop the antibiotic.

## 2016-12-28 NOTE — Telephone Encounter (Signed)
Pt was seen on  12-13-16 to go over this pt did not make a follow up visit. Pt and her husband was very upset when they left.

## 2016-12-28 NOTE — Telephone Encounter (Signed)
Please advise, thanks.

## 2016-12-28 NOTE — Telephone Encounter (Signed)
FYI Patient was informed of Dr Patsey Bertholdook's statement in reference to the 10 day supply.

## 2016-12-28 NOTE — Telephone Encounter (Signed)
Patient wants to see if there is any way she can get her lorazepam filled due to her anxiety. She has been dealing with it for several days and is currently taking Melatonin which she states is not working. She is schedule to see Dr. Adriana Simasook and this week 12/30/2016. She is having trouble concentrating and cannot handle the stress she is dealing with.

## 2016-12-28 NOTE — Telephone Encounter (Signed)
Looks like a note slipped in here, can you advise for a referral to another psych? Thanks

## 2016-12-28 NOTE — Telephone Encounter (Signed)
Pt called back stating no Rx at pharmacy. Please advise?  Call pt @ [phone]. Thank you!

## 2016-12-28 NOTE — Progress Notes (Signed)
Unable to leave a vm due to mailbox full

## 2016-12-30 ENCOUNTER — Ambulatory Visit (INDEPENDENT_AMBULATORY_CARE_PROVIDER_SITE_OTHER): Payer: 59 | Admitting: Family Medicine

## 2016-12-30 ENCOUNTER — Encounter: Payer: Self-pay | Admitting: Family Medicine

## 2016-12-30 VITALS — BP 110/70 | HR 107 | Temp 98.5°F | Resp 16 | Ht 62.0 in | Wt 124.4 lb

## 2016-12-30 DIAGNOSIS — R300 Dysuria: Secondary | ICD-10-CM | POA: Diagnosis not present

## 2016-12-30 DIAGNOSIS — N3000 Acute cystitis without hematuria: Secondary | ICD-10-CM | POA: Diagnosis not present

## 2016-12-30 DIAGNOSIS — F410 Panic disorder [episodic paroxysmal anxiety] without agoraphobia: Secondary | ICD-10-CM

## 2016-12-30 LAB — POCT URINALYSIS DIPSTICK
Bilirubin, UA: NEGATIVE
Glucose, UA: NEGATIVE
Nitrite, UA: POSITIVE
PROTEIN UA: 100
SPEC GRAV UA: 1.025 (ref 1.010–1.025)
UROBILINOGEN UA: 1 U/dL
pH, UA: 6.5 (ref 5.0–8.0)

## 2016-12-30 MED ORDER — LORAZEPAM 0.5 MG PO TABS: 0.5000 mg | ORAL_TABLET | Freq: Three times a day (TID) | ORAL | 0 refills | Status: DC | PRN

## 2016-12-30 NOTE — Patient Instructions (Signed)
Be sure to see Dr. Maryruth BunKapur.  Take care  Dr. Adriana Simasook

## 2016-12-31 DIAGNOSIS — N3000 Acute cystitis without hematuria: Secondary | ICD-10-CM | POA: Insufficient documentation

## 2016-12-31 NOTE — Assessment & Plan Note (Signed)
Uncontrolled. Patient and I had a lengthy discussion about her medication use. I informed her that she needs to get her medication from one pharmacy and one physician. She states that she understands and is in agreement. I have refilled her lorazepam today. Once our controlled substance policy is finalized she will sign a contract. She is in agreement with this as well. She has an upcoming appointment with psychiatry and I advised her to be sure to attend.

## 2016-12-31 NOTE — Assessment & Plan Note (Signed)
Urinalysis was abnormal today. Sending for culture.

## 2016-12-31 NOTE — Progress Notes (Signed)
Subjective:  Patient ID: Katelyn Whitehead, female    DOB: Apr 08, 1991  Age: 26 y.o. MRN: [id number]  CC: Follow up  HPI:  26 year old female with depression, panic disorder/anxiety, and suspected hypochondriasis presents for follow-up.  Patient is struggling with her mood particularly anxiety. She is quite overwhelmed. She is a new mother and this is quite demanding on her. She is currently breast-feeding. She states that she is constantly worried. She is afraid she is going to die. She has several associated somatic complaints. She is currently on Trintellix and states that she's starting to have some benefit. She is also on the rise up a.m. and she is currently out. She is in need of refill today.  Of note, patient has seen several providers in the past few months and has received benzodiazepine from each one of them. I received a call from the pharmacy the last time that I feel this medication and forming me of this and that she may be abusing the medication. She and I will discuss today.  Also, patient recently found to have UTI. She has completed a course of antibiotic. She is very worried about her abnormal urine and wants it reevaluated today.  Social Hx   Social History   Social History  . Marital status: Married    Spouse name: N/A  . Number of children: N/A  . Years of education: N/A   Social History Main Topics  . Smoking status: Never Smoker  . Smokeless tobacco: Never Used  . Alcohol use No  . Drug use: No  . Sexual activity: Yes    Birth control/ protection: None, Pill     Comment: mirena   Other Topics Concern  . None   Social History Narrative  . None    Review of Systems  Constitutional: Positive for fatigue.  Psychiatric/Behavioral: The patient is nervous/anxious.    Objective:  BP 110/70   Pulse (!) 107   Temp 98.5 F (36.9 C) (Oral)   Resp 16   Ht 5\' 2"  (1.575 m)   Wt 124 lb 6 oz (56.4 kg)   LMP 12/10/2016 (Exact Date)   SpO2 98%   BMI 22.75  kg/m   BP/Weight 12/30/2016 12/16/2016 12/14/2016  Systolic BP 110 98 106  Diastolic BP 70 68 69  Wt. (Lbs) 124.38 126.13 125.19  BMI 22.75 23.07 22.9    Physical Exam  Constitutional: She is oriented to person, place, and time. She appears well-developed. No distress.  Cardiovascular: Regular rhythm.   Tachycardic.  Pulmonary/Chest: Effort normal and breath sounds normal.  Neurological: She is alert and oriented to person, place, and time.  Psychiatric:  Very anxious. Intermittently tearful.  Vitals reviewed.   Lab Results  Component Value Date   WBC 5.5 11/22/2016   HGB 12.4 11/22/2016   HCT 37.2 11/22/2016   PLT 311 11/22/2016   GLUCOSE 93 11/12/2016   ALT 13 (L) 11/12/2016   AST 17 11/12/2016   NA 140 11/12/2016   K 3.2 (L) 11/12/2016   CL 103 11/12/2016   CREATININE 0.79 11/12/2016   BUN 11 11/12/2016   CO2 30 11/12/2016   TSH 0.808 11/12/2016    Assessment & Plan:   Problem List Items Addressed This Visit    Panic disorder - Primary    Uncontrolled. Patient and I had a lengthy discussion about her medication use. I informed her that she needs to get her medication from one pharmacy and one physician. She states that she understands  and is in agreement. I have refilled her lorazepam today. Once our controlled substance policy is finalized she will sign a contract. She is in agreement with this as well. She has an upcoming appointment with psychiatry and I advised her to be sure to attend.      Relevant Medications   LORazepam (ATIVAN) 0.5 MG tablet   Acute cystitis without hematuria    Urinalysis was abnormal today. Sending for culture.      Relevant Orders   POCT urinalysis dipstick (Completed)   Urine Culture      Meds ordered this encounter  Medications  . LORazepam (ATIVAN) 0.5 MG tablet    Sig: Take 1-2 tablets (0.5-1 mg total) by mouth every 8 (eight) hours as needed for anxiety.    Dispense:  90 tablet    Refill:  0    Follow-up: PRN  Everlene Other DO [facility]

## 2017-01-01 LAB — URINE CULTURE

## 2017-01-04 ENCOUNTER — Encounter: Payer: Self-pay | Admitting: *Deleted

## 2017-01-04 DIAGNOSIS — F900 Attention-deficit hyperactivity disorder, predominantly inattentive type: Secondary | ICD-10-CM | POA: Diagnosis not present

## 2017-01-04 DIAGNOSIS — F4001 Agoraphobia with panic disorder: Secondary | ICD-10-CM | POA: Diagnosis not present

## 2017-01-04 DIAGNOSIS — F411 Generalized anxiety disorder: Secondary | ICD-10-CM | POA: Diagnosis not present

## 2017-01-05 ENCOUNTER — Telehealth: Payer: Self-pay | Admitting: *Deleted

## 2017-01-05 DIAGNOSIS — R300 Dysuria: Secondary | ICD-10-CM

## 2017-01-05 NOTE — Telephone Encounter (Signed)
Order placed for f/u urine in 2-4 weeks.

## 2017-01-11 ENCOUNTER — Encounter: Payer: Self-pay | Admitting: Obstetrics and Gynecology

## 2017-01-12 ENCOUNTER — Encounter: Payer: Self-pay | Admitting: Obstetrics and Gynecology

## 2017-01-12 ENCOUNTER — Ambulatory Visit (INDEPENDENT_AMBULATORY_CARE_PROVIDER_SITE_OTHER): Payer: 59 | Admitting: Obstetrics and Gynecology

## 2017-01-12 VITALS — BP 114/70 | HR 92 | Ht 62.0 in | Wt 128.5 lb

## 2017-01-12 DIAGNOSIS — R102 Pelvic and perineal pain: Secondary | ICD-10-CM | POA: Diagnosis not present

## 2017-01-12 DIAGNOSIS — L989 Disorder of the skin and subcutaneous tissue, unspecified: Secondary | ICD-10-CM | POA: Diagnosis not present

## 2017-01-12 NOTE — Progress Notes (Signed)
HPI:      [name] is a 26 y.o. G2P1011 who LMP was Patient's last menstrual period was 12/10/2016 (approximate).  Subjective:   She presents today With a few issues. She states that her family physician has prescribed some medication for her that has dramatically helped her anxiety. She complains of abdominal skin lesion that occasionally drains. She is worried that it could be MRSA. She complains of intermittent vaginal pain. Not worse with sex not worse with bowel movements not worse with urination. She continues to breast-feed full-time and is taking progesterone only OCPs. She denies vaginal bleeding.    Hx: The following portions of the patient's history were reviewed and updated as appropriate:             She  has a past medical history of ADHD (attention deficit hyperactivity disorder); Anxiety; Asthma; Depression; Mild mitral and aortic regurgitation; Palpitations; and Panic disorder. She  does not have any pertinent problems on file. She  has a past surgical history that includes Wisdom tooth extraction and IUD removal (N/A, 11/22/2016). Her family history includes ADD / ADHD in her brother; Dementia in her maternal grandmother; Depression in her maternal aunt, maternal grandmother, and mother; Drug abuse in her father. She  reports that she has never smoked. She has never used smokeless tobacco. She reports that she does not drink alcohol or use drugs. She is allergic to influenza vaccines and sulfa antibiotics.       Review of Systems:  Review of Systems  Constitutional: Denied constitutional symptoms, night sweats, recent illness, fatigue, fever, insomnia and weight loss.  Eyes: Denied eye symptoms, eye pain, photophobia, vision change and visual disturbance.  Ears/Nose/Throat/Neck: Denied ear, nose, throat or neck symptoms, hearing loss, nasal discharge, sinus congestion and sore throat.  Cardiovascular: Denied cardiovascular symptoms, arrhythmia, chest  pain/pressure, edema, exercise intolerance, orthopnea and palpitations.  Respiratory: Denied pulmonary symptoms, asthma, pleuritic pain, productive sputum, cough, dyspnea and wheezing.  Gastrointestinal: Denied, gastro-esophageal reflux, melena, nausea and vomiting.  Genitourinary: See HPI for additional information.  Musculoskeletal: Denied musculoskeletal symptoms, stiffness, swelling, muscle weakness and myalgia.  Dermatologic: See HPI for additional information.  Neurologic: Denied neurology symptoms, dizziness, headache, neck pain and syncope.  Psychiatric: Denied psychiatric symptoms, anxiety and depression.  Endocrine: Denied endocrine symptoms including hot flashes and night sweats.   Meds:   Current Outpatient Prescriptions on File Prior to Visit  Medication Sig Dispense Refill  . LORazepam (ATIVAN) 0.5 MG tablet Take 1-2 tablets (0.5-1 mg total) by mouth every 8 (eight) hours as needed for anxiety. 90 tablet 0  . norethindrone (MICRONOR,CAMILA,ERRIN) 0.35 MG tablet Take 1 tablet (0.35 mg total) by mouth daily. 1 Package 11  . Prenatal Vit-Fe Fumarate-FA (PRENATAL MULTIVITAMIN) TABS tablet Take 1 tablet by mouth daily at 12 noon. 60 tablet 3  . vortioxetine HBr (TRINTELLIX) 10 MG TABS Take 1 tablet (10 mg total) by mouth daily. 56 tablet 0  . Vitamin D, Ergocalciferol, (DRISDOL) 50000 units CAPS capsule Take 1 capsule (50,000 Units total) by mouth every 7 (seven) days. (Patient not taking: Reported on 01/12/2017) 30 capsule 1   No current facility-administered medications on file prior to visit.     Objective:     Vitals:   01/12/17 1646  BP: 114/70  Pulse: 92    Physical examination   Pelvic:   Vulva: Normal appearance.  No lesions.  Vagina: No lesions or abnormalities noted.  Support: Normal pelvic support.  Urethra No masses tenderness  or scarring.  Meatus Normal size without lesions or prolapse.  Cervix: Normal appearance.  No lesions.  Anus: Normal exam.  No  lesions.  Perineum: Normal exam.  No lesions.        Bimanual   Uterus: Normal size.  Non-tender.  Mobile.  AV.  Adnexae: No masses.  Non-tender to palpation.  Cul-de-sac: Negative for abnormality.             Abdominal skin lesion in scar - no drainage at this time. Little to no erythema.  Assessment:    G2P1011 Patient Active Problem List   Diagnosis Date Noted  . Acute cystitis without hematuria 12/31/2016  . Dizziness 12/16/2016  . Pre-syncope 11/12/2016  . Vitamin D deficiency 05/13/2016  . B12 deficiency 05/13/2016  . Mild mitral and aortic regurgitation   . Panic disorder   . Depression, major, recurrent, moderate (HCC) 12/04/2014     1. Vaginal pain   2. Skin lesion     No abnormalities noted on pelvic exam. Pain could not be elicited with exam.     Plan:            1.  Expectant management of vaginal pain. We have discussed breast-feeding and vaginal changes associated with this. I have reassured her that any vaginal changes as a result of breast-feeding will resolve when she stops breast-feeding.   2.  Bacterial cultures performed on abdominal skin lesion.    F/U  Return for We will contact her with any abnormal test results.  Elonda Huskyavid J. Devron Cohick, M.D. 01/12/2017 5:03 PM

## 2017-01-13 DIAGNOSIS — L989 Disorder of the skin and subcutaneous tissue, unspecified: Secondary | ICD-10-CM | POA: Diagnosis not present

## 2017-01-13 NOTE — Addendum Note (Signed)
Addended by: Brooke DareSICK, Mahum Betten L on: 01/13/2017 09:09 AM   Modules accepted: Orders

## 2017-01-18 ENCOUNTER — Telehealth: Payer: Self-pay

## 2017-01-18 LAB — ANAEROBIC AND AEROBIC CULTURE

## 2017-01-18 NOTE — Telephone Encounter (Signed)
-----   Message from Linzie Collinavid James Evans, MD sent at 01/18/2017 12:24 PM EDT ----- No growth of bacteria

## 2017-01-18 NOTE — Telephone Encounter (Signed)
mychart message sent- neg results per provider

## 2017-01-20 ENCOUNTER — Encounter: Payer: Self-pay | Admitting: Family Medicine

## 2017-01-20 ENCOUNTER — Ambulatory Visit (INDEPENDENT_AMBULATORY_CARE_PROVIDER_SITE_OTHER): Payer: 59 | Admitting: Family Medicine

## 2017-01-20 VITALS — BP 100/60 | HR 62 | Temp 97.9°F | Resp 16 | Ht 62.0 in | Wt 128.0 lb

## 2017-01-20 DIAGNOSIS — T161XXA Foreign body in right ear, initial encounter: Secondary | ICD-10-CM | POA: Diagnosis not present

## 2017-01-20 DIAGNOSIS — R809 Proteinuria, unspecified: Secondary | ICD-10-CM | POA: Diagnosis not present

## 2017-01-20 DIAGNOSIS — T169XXA Foreign body in ear, unspecified ear, initial encounter: Secondary | ICD-10-CM | POA: Insufficient documentation

## 2017-01-20 DIAGNOSIS — F410 Panic disorder [episodic paroxysmal anxiety] without agoraphobia: Secondary | ICD-10-CM | POA: Diagnosis not present

## 2017-01-20 LAB — POCT URINALYSIS DIPSTICK
Bilirubin, UA: NEGATIVE
Glucose, UA: NEGATIVE
KETONES UA: NEGATIVE
Nitrite, UA: NEGATIVE
PROTEIN UA: NEGATIVE
SPEC GRAV UA: 1.02 (ref 1.010–1.025)
UROBILINOGEN UA: 0.2 U/dL
pH, UA: 7 (ref 5.0–8.0)

## 2017-01-20 MED ORDER — LORAZEPAM 0.5 MG PO TABS: 0.5000 mg | ORAL_TABLET | Freq: Three times a day (TID) | ORAL | 0 refills | Status: DC | PRN

## 2017-01-20 NOTE — Progress Notes (Signed)
Subjective:  Patient ID: Katelyn Whitehead, female    DOB: 01-14-1991  Age: 26 y.o. MRN: [id number]  CC: Follow up   HPI:  26 year old female presents for follow-up.  Anxiety  I have spoken with her psychiatrist. There are several issues. Psychiatrist informed me that she has seen her in the past and that she often seeks benzodiazepines. She has not been compliant with therapy. Does not pay co-pays.   Patient states that she is doing well at this time. She has went back to work. She states that she is using the Ativan as prescribed. She states that she is in need of a refill today.  She states that she has been interested in seeing a psychiatrist in [place]. She wants my input regarding this.  Right ear pain  Patient reports that earlier this week she developed right ear discomfort.  She states that it feels like she has fluid behind her ear.  No fevers or chills.  No known exacerbating or relieving factors.  She does use Q-tips.  Proteinuria  Patient has had previous urinalyses with proteinuria. This was in the setting of infection.  Patient has been very preoccupied by this despite reassurance.  She would like her urine rechecked today. She has no urinary complaints at this time.   Social Hx   Social History   Social History  . Marital status: Married    Spouse name: N/A  . Number of children: N/A  . Years of education: N/A   Social History Main Topics  . Smoking status: Never Smoker  . Smokeless tobacco: Never Used  . Alcohol use No  . Drug use: No  . Sexual activity: Yes    Birth control/ protection: None, Pill     Comment: mirena   Other Topics Concern  . None   Social History Narrative  . None    Review of Systems  HENT: Positive for ear pain.   Psychiatric/Behavioral: The patient is nervous/anxious.    Objective:  BP 100/60   Pulse 62   Temp 97.9 F (36.6 C) (Oral)   Resp 16   Ht 5\' 2"  (1.575 m)   Wt 128 lb (58.1 kg)   LMP  11/30/2016   SpO2 98%   BMI 23.41 kg/m   BP/Weight 01/20/2017 01/12/2017 12/30/2016  Systolic BP 100 114 110  Diastolic BP 60 70 70  Wt. (Lbs) 128 128.5 124.38  BMI 23.41 23.5 22.75    Physical Exam  Constitutional: She is oriented to person, place, and time. She appears well-developed. No distress.  HENT:  Head: Normocephalic and atraumatic.  R ear - cotton from Qtip noted in the canal. Normal TM.  Pulmonary/Chest: Effort normal. No respiratory distress.  Neurological: She is alert and oriented to person, place, and time.  Psychiatric:  Anxious, perseverative.   Vitals reviewed.   Lab Results  Component Value Date   WBC 5.5 11/22/2016   HGB 12.4 11/22/2016   HCT 37.2 11/22/2016   PLT 311 11/22/2016   GLUCOSE 93 11/12/2016   ALT 13 (L) 11/12/2016   AST 17 11/12/2016   NA 140 11/12/2016   K 3.2 (L) 11/12/2016   CL 103 11/12/2016   CREATININE 0.79 11/12/2016   BUN 11 11/12/2016   CO2 30 11/12/2016   TSH 0.808 11/12/2016    Assessment & Plan:   Problem List Items Addressed This Visit      Nervous and Auditory   Foreign body in ear    New  problem. Foreign body removed with irrigation today.         Other   Proteinuria    Resolved on UA today.       Relevant Orders   POC Urinalysis Dipstick (Completed)   Panic disorder - Primary    [place] database reviewed today.  She has not gotten additional refills from other providers. She has taken as prescribed as she still has some pills left. Advised that she should see psychiatry. Trying to expedite referral. Ativan refilled today. I will NOT refill again given discussion with psychiatry.      Relevant Medications   LORazepam (ATIVAN) 0.5 MG tablet   Other Relevant Orders   Ambulatory referral to Psychiatry      Meds ordered this encounter  Medications  . LORazepam (ATIVAN) 0.5 MG tablet    Sig: Take 1-2 tablets (0.5-1 mg total) by mouth every 8 (eight) hours as needed for anxiety.    Dispense:  90 tablet     Refill:  0   Follow-up: Annually  Everlene OtherJayce Anabell Swint DO [facility] [place]

## 2017-01-20 NOTE — Assessment & Plan Note (Signed)
New problem. Foreign body removed with irrigation today.

## 2017-01-20 NOTE — Assessment & Plan Note (Signed)
Resolved on UA today.   

## 2017-01-20 NOTE — Patient Instructions (Signed)
Continue your meds.  See psychiatry ( I am happy and try to arrange sooner if you like).   Follow up annually  Take care  Dr. Adriana Simasook

## 2017-01-20 NOTE — Assessment & Plan Note (Signed)
[place] database reviewed today.  She has not gotten additional refills from other providers. She has taken as prescribed as she still has some pills left. Advised that she should see psychiatry. Trying to expedite referral. Ativan refilled today. I will NOT refill again given discussion with psychiatry.

## 2017-01-27 ENCOUNTER — Ambulatory Visit: Payer: 59 | Admitting: Family Medicine

## 2017-02-11 ENCOUNTER — Other Ambulatory Visit: Payer: Self-pay | Admitting: Family Medicine

## 2017-02-11 ENCOUNTER — Telehealth: Payer: Self-pay | Admitting: Family Medicine

## 2017-02-11 ENCOUNTER — Ambulatory Visit: Payer: 59 | Admitting: Family Medicine

## 2017-02-11 DIAGNOSIS — Z0289 Encounter for other administrative examinations: Secondary | ICD-10-CM

## 2017-02-11 DIAGNOSIS — F410 Panic disorder [episodic paroxysmal anxiety] without agoraphobia: Secondary | ICD-10-CM

## 2017-02-11 MED ORDER — LORAZEPAM 0.5 MG PO TABS: ORAL_TABLET | ORAL | 0 refills | Status: DC

## 2017-02-11 NOTE — Telephone Encounter (Signed)
Tapering off Ativan. She can pick up samples of Trintellix.

## 2017-02-11 NOTE — Telephone Encounter (Signed)
Pt called about which medication is going to be tapered off. Please advise?   Call pt @ [phone]. Thank you!

## 2017-02-11 NOTE — Telephone Encounter (Signed)
Patient is okay with the taper medication. Will set up appt to see her psychiatrist and will call back with date of her appt. Please send medication to [facility]

## 2017-02-11 NOTE — Telephone Encounter (Signed)
Pt came back into office and wanted to know if Dr. Adriana Simasook has coupons or samples of vortioxetine HBr (TRINTELLIX) 10 MG TABS. It costs her $200 with insurance.

## 2017-02-11 NOTE — Telephone Encounter (Signed)
I can no longer continue to fill this. I will send in a taper for her to wean off. This needs to be prescribed by psychiatry.

## 2017-02-11 NOTE — Telephone Encounter (Signed)
Patient will be here before 5 pm to pick up Trintellix samples

## 2017-02-11 NOTE — Telephone Encounter (Signed)
Pt was late for appt and had to reschedule. She has enough LORazepam (ATIVAN) 0.5 MG tablet until next Tuesday. She was wondering if it could be refilled. She is still waiting for an appt with the psychiatrists .

## 2017-02-11 NOTE — Telephone Encounter (Signed)
This was your 1330 patient on 02/11/17 who came in late...Marland Kitchen..Marland Kitchen

## 2017-02-16 ENCOUNTER — Encounter: Payer: Self-pay | Admitting: Physician Assistant

## 2017-02-16 ENCOUNTER — Ambulatory Visit: Payer: Self-pay | Admitting: Physician Assistant

## 2017-02-16 VITALS — BP 90/60 | HR 108 | Temp 98.4°F

## 2017-02-16 DIAGNOSIS — N390 Urinary tract infection, site not specified: Secondary | ICD-10-CM

## 2017-02-16 DIAGNOSIS — R319 Hematuria, unspecified: Secondary | ICD-10-CM

## 2017-02-16 DIAGNOSIS — R300 Dysuria: Secondary | ICD-10-CM

## 2017-02-16 LAB — POCT URINALYSIS DIPSTICK
BILIRUBIN UA: NEGATIVE
GLUCOSE UA: NEGATIVE
Ketones, UA: NEGATIVE
NITRITE UA: NEGATIVE
Protein, UA: NEGATIVE
Spec Grav, UA: >=1.03 — AB (ref 1.010–1.025)
Urobilinogen, UA: 0.2 E.U./dL
pH, UA: 6 (ref 5.0–8.0)

## 2017-02-16 MED ORDER — NITROFURANTOIN MONOHYD MACRO 100 MG PO CAPS: 100.0000 mg | ORAL_CAPSULE | Freq: Two times a day (BID) | ORAL | 0 refills | Status: DC

## 2017-02-16 NOTE — Progress Notes (Signed)
S:  C/o uti sx for 2 days, burning, urgency, frequency, denies vaginal discharge, abdominal pain or flank pain:  Remainder ros neg  O:  Vitals wnl, nad, no cva tenderness, ua 1+ leuks  A: uti  P: macrobid 100mg   bid x 7d, increase water intake, add cranberry juice, return if not improving in 2 -3 days, return earlier if worsening, discussed pyelonephritis sx, instructed pt to pump breast milk and dump while on medication

## 2017-03-01 ENCOUNTER — Telehealth: Payer: Self-pay | Admitting: Family Medicine

## 2017-03-01 ENCOUNTER — Other Ambulatory Visit: Payer: Self-pay | Admitting: Family Medicine

## 2017-03-01 NOTE — Telephone Encounter (Signed)
Dr.Cook patient  

## 2017-03-01 NOTE — Telephone Encounter (Signed)
Pt would like a referral to see the Urologist pt states she keeps having recurring uti's. Since she's had the baby. Please advise?  Call pt @ [phone]. Thank you!

## 2017-03-03 ENCOUNTER — Other Ambulatory Visit: Payer: Self-pay | Admitting: Family Medicine

## 2017-03-03 ENCOUNTER — Encounter: Payer: Self-pay | Admitting: Obstetrics and Gynecology

## 2017-03-03 DIAGNOSIS — F410 Panic disorder [episodic paroxysmal anxiety] without agoraphobia: Secondary | ICD-10-CM | POA: Diagnosis not present

## 2017-03-03 DIAGNOSIS — F429 Obsessive-compulsive disorder, unspecified: Secondary | ICD-10-CM | POA: Diagnosis not present

## 2017-03-03 DIAGNOSIS — N390 Urinary tract infection, site not specified: Secondary | ICD-10-CM

## 2017-03-03 DIAGNOSIS — F53 Puerperal psychosis: Secondary | ICD-10-CM | POA: Diagnosis not present

## 2017-03-04 ENCOUNTER — Telehealth: Payer: Self-pay | Admitting: Family Medicine

## 2017-03-04 NOTE — Telephone Encounter (Signed)
Recommend walk in clinic.

## 2017-03-04 NOTE — Telephone Encounter (Signed)
Please advise, looks like she was treated for a UTI recently

## 2017-03-04 NOTE — Telephone Encounter (Signed)
Pt called and stated that she has another uti and wanted to know if Dr. Adriana Simasook could call in an antibiotic. Please advise, thank you!  Call pt @ [phone]

## 2017-03-28 NOTE — Progress Notes (Deleted)
03/29/2017 9:51 PM   Katelyn Whitehead 11/14/1990 [id number]  Referring provider: Coral Spikes, DO [address] [place], [place] [address]  No chief complaint on file.   HPI: Patient is a 26 -year-old Caucasian female who is referred to [place] by, Dr. Thersa Salt , for recurrent urinary tract infections.  Patient states that she has had *** urinary tract infections over the last year.  Reviewing her records,  she has had 5 documented UTI's over the past year.    Her symptoms with a urinary tract infection consist of ***.  She denies/endorses dysuria, gross hematuria, suprapubic pain, back pain, abdominal pain or flank pain.***  She denies/endorses dysuria, gross hematuria, suprapubic pain, back pain, abdominal pain or flank pain.***  She has not had any recent fevers, chills, nausea or vomiting. ***  She has not had any recent fevers, chills, nausea or vomiting. ***  She does/does not have a history of nephrolithiasis, GU surgery or GU trauma. ***  She does/does not have a history of nephrolithiasis, GU surgery or GU trauma. ***  She is/is not sexually active.  She has/has not noted a correlation with her urinary tract infections and sexual intercourse.  ***   She does/does not engage in anal sex. ***  She is/ is not having anal to vaginal sex.*** She is/is not voiding before and after sex. ***     She is/is not postmenopausal. ***  She admits to/denies constipation and/or diarrhea. ***  She does/does not use tampons.  She does/does not engage in good perineal hygiene. She does/does not take tub baths. ***  She has/does not have incontinence.  She is using incontinence pads. ***  She is having/ not having pain with bladder filling.  ***  She has not had any recent imaging studies.    She is drinking *** of water daily.     Reviewed referral notes.    PMH: Past Medical History:  Diagnosis Date  . ADHD (attention deficit hyperactivity disorder)   .  Anxiety   . Asthma    childhood  . Depression   . Mild mitral and aortic regurgitation    a. 04/2013 Echo: EF 50-55%, mild AI, mild MR, no MVP.  Marland Kitchen Palpitations    a. 2012 Holter: sinus tach, pvc's.  . Panic disorder     Surgical History: Past Surgical History:  Procedure Laterality Date  . IUD REMOVAL N/A 11/22/2016   Procedure: LAPAROCOPIC INTRAUTERINE DEVICE (IUD) REMOVAL;  Surgeon: Harlin Heys, MD;  Location: [facility] ORS;  Service: Gynecology;  Laterality: N/A;  . WISDOM TOOTH EXTRACTION      Home Medications:  Allergies as of 03/29/2017      Reactions   Influenza Vaccines Hives   Sulfa Antibiotics    Hives Rash      Medication List       Accurate as of 03/28/17  9:51 PM. Always use your most recent med list.          LORazepam 0.5 MG tablet Commonly known as:  ATIVAN 1 mg (2 tablets) three times daily x 1 week, then 0.5 mg (1 tablet) three times daily x 1 week, then 0.5 mg (1 tablet) 2 times daily x 1 week, then 0.25 mg (1/2 tablet) twice daily x 1 week, then 0.25 mg (1/2 tablet) daily x 1 week, then 0.25 mg (1/2 tablet) every other day x 1 week, then stop.   nitrofurantoin (macrocrystal-monohydrate) 100 MG capsule Commonly known as:  MACROBID Take 1 capsule (100 mg total) by mouth 2 (two) times daily.   norethindrone 0.35 MG tablet Commonly known as:  MICRONOR,CAMILA,ERRIN Take 1 tablet (0.35 mg total) by mouth daily.   prenatal multivitamin Tabs tablet Take 1 tablet by mouth daily at 12 noon.   Vitamin D (Ergocalciferol) 50000 units Caps capsule Commonly known as:  DRISDOL Take 1 capsule (50,000 Units total) by mouth every 7 (seven) days.   vortioxetine HBr 10 MG Tabs Commonly known as:  TRINTELLIX Take 1 tablet (10 mg total) by mouth daily.       Allergies:  Allergies  Allergen Reactions  . Influenza Vaccines Hives  . Sulfa Antibiotics     Hives Rash    Family History: Family History  Problem Relation Age of Onset  . Depression Mother     . Drug abuse Father   . ADD / ADHD Brother   . Depression Maternal Aunt   . Dementia Maternal Grandmother   . Depression Maternal Grandmother     Social History:  reports that she has never smoked. She has never used smokeless tobacco. She reports that she does not drink alcohol or use drugs.  ROS:                                        Physical Exam: There were no vitals taken for this visit.  Constitutional: Well nourished. Alert and oriented, No acute distress. HEENT: [place] AT, moist mucus membranes. Trachea midline, no masses. Cardiovascular: No clubbing, cyanosis, or edema. Respiratory: Normal respiratory effort, no increased work of breathing. GI: Abdomen is soft, non tender, non distended, no abdominal masses. Liver and spleen not palpable.  No hernias appreciated.  Stool sample for occult testing is not indicated.   GU: No CVA tenderness.  No bladder fullness or masses.  Normal external genitalia, normal pubic hair distribution, no lesions.  Normal urethral meatus, no lesions, no prolapse, no discharge.   No urethral masses, tenderness and/or tenderness. No bladder fullness, tenderness or masses. Normal vagina mucosa, good estrogen effect, no discharge, no lesions, good pelvic support, no cystocele or rectocele noted.  No cervical motion tenderness.  Uterus is freely mobile and non-fixed.  No adnexal/parametria masses or tenderness noted.  Anus and perineum are without rashes or lesions.   *** Skin: No rashes, bruises or suspicious lesions. Lymph: No cervical or inguinal adenopathy. Neurologic: Grossly intact, no focal deficits, moving all 4 extremities. Psychiatric: Normal mood and affect.  Laboratory Data: Lab Results  Component Value Date   WBC 5.5 11/22/2016   HGB 12.4 11/22/2016   HCT 37.2 11/22/2016   MCV 78.1 (L) 11/22/2016   PLT 311 11/22/2016    Lab Results  Component Value Date   CREATININE 0.79 11/12/2016     Lab Results  Component  Value Date   TSH 0.808 11/12/2016     Lab Results  Component Value Date   AST 17 11/12/2016   Lab Results  Component Value Date   ALT 13 (L) 11/12/2016    Urinalysis ***  I have reviewed the labs.   Pertinent Imaging: *** I have independently reviewed the films.    Assessment & Plan:  ***  1. Recurrent UTI's  - criteria for recurrent UTI has been met with 2 or more infections in 6 months or 3 or greater infections in one year   - Patient is instructed to  increase their water intake until the urine is pale yellow or clear (10 to 12 cups daily) ***  - probiotics (yogurt, oral pills or vaginal suppositories), take cranberry pills or drink the juice and Vitamin C 1,000 mg daily to acidify the urine should be added to their daily regimen ***  -. if using tampons, she should remove them prior to urinating and change them often ***  -avoid soaking in tubs and wipe front to back after urinating ***  - benefit from core strengthening exercises has been seen.  We can refer her to PT if they desire ***  - advised them to have CATH UA's for urinalysis and culture to prevent skin contamination of the specimen  - reviewed symptoms of UTI and advised not to have urine checked or be treated for UTI if not experiencing symptoms  - discussed antibiotic stewardship with the patient                                                   No Follow-up on file.  These notes generated with voice recognition software. I apologize for typographical errors.  Zara Council, [facility] [address] [place], [place] [address] [phone]

## 2017-03-29 ENCOUNTER — Ambulatory Visit: Payer: Self-pay | Admitting: Urology

## 2017-03-29 ENCOUNTER — Encounter: Payer: Self-pay | Admitting: Urology

## 2017-03-31 DIAGNOSIS — F53 Puerperal psychosis: Secondary | ICD-10-CM | POA: Diagnosis not present

## 2017-03-31 DIAGNOSIS — F410 Panic disorder [episodic paroxysmal anxiety] without agoraphobia: Secondary | ICD-10-CM | POA: Diagnosis not present

## 2017-03-31 DIAGNOSIS — F429 Obsessive-compulsive disorder, unspecified: Secondary | ICD-10-CM | POA: Diagnosis not present

## 2017-04-07 DIAGNOSIS — F410 Panic disorder [episodic paroxysmal anxiety] without agoraphobia: Secondary | ICD-10-CM | POA: Diagnosis not present

## 2017-04-07 DIAGNOSIS — F429 Obsessive-compulsive disorder, unspecified: Secondary | ICD-10-CM | POA: Diagnosis not present

## 2017-04-12 ENCOUNTER — Telehealth: Payer: Self-pay | Admitting: Cardiovascular Disease

## 2017-04-12 DIAGNOSIS — R0602 Shortness of breath: Secondary | ICD-10-CM

## 2017-04-12 NOTE — Telephone Encounter (Signed)
Pt states she is still having SOB and fatigue. She states she feels as if her anxiety medication is helping, and she asks if she can have her echo repeat. Please call.

## 2017-04-12 NOTE — Telephone Encounter (Signed)
Previous echo 2014 in 2017 were normal Certainly welcome to get echo if she is having new symptoms of shortness of breath

## 2017-04-12 NOTE — Telephone Encounter (Signed)
Since having her baby she has been having continued shortness of breath and fatigue. She would like to see if we could do another echocardiogram to just make sure everything is the same. So she just wants some reassurance with a repeat echo to make sure nothing has changed. Let her know that I would route message to Dr. Mariah MillingGollan for review and would be in touch with her regarding repeat study.

## 2017-04-13 NOTE — Telephone Encounter (Signed)
Left detailed message per release form that since she is having new symptoms of shortness of breath order for echocardiogram has been entered and is ready for scheduling with instructions to call back if any further questions.

## 2017-04-22 ENCOUNTER — Other Ambulatory Visit: Payer: 59

## 2017-04-25 ENCOUNTER — Other Ambulatory Visit: Payer: 59

## 2017-04-29 ENCOUNTER — Ambulatory Visit (INDEPENDENT_AMBULATORY_CARE_PROVIDER_SITE_OTHER): Payer: 59

## 2017-04-29 ENCOUNTER — Other Ambulatory Visit: Payer: Self-pay

## 2017-04-29 DIAGNOSIS — R0602 Shortness of breath: Secondary | ICD-10-CM

## 2017-05-05 ENCOUNTER — Encounter: Payer: Self-pay | Admitting: Obstetrics and Gynecology

## 2017-05-19 DIAGNOSIS — F410 Panic disorder [episodic paroxysmal anxiety] without agoraphobia: Secondary | ICD-10-CM | POA: Diagnosis not present

## 2017-05-19 DIAGNOSIS — F429 Obsessive-compulsive disorder, unspecified: Secondary | ICD-10-CM | POA: Diagnosis not present

## 2017-06-16 DIAGNOSIS — F53 Postpartum depression: Secondary | ICD-10-CM | POA: Diagnosis not present

## 2017-06-16 DIAGNOSIS — F429 Obsessive-compulsive disorder, unspecified: Secondary | ICD-10-CM | POA: Diagnosis not present

## 2017-06-16 DIAGNOSIS — F410 Panic disorder [episodic paroxysmal anxiety] without agoraphobia: Secondary | ICD-10-CM | POA: Diagnosis not present

## 2017-07-28 DIAGNOSIS — F410 Panic disorder [episodic paroxysmal anxiety] without agoraphobia: Secondary | ICD-10-CM | POA: Diagnosis not present

## 2017-07-28 DIAGNOSIS — F429 Obsessive-compulsive disorder, unspecified: Secondary | ICD-10-CM | POA: Diagnosis not present

## 2017-09-09 DIAGNOSIS — F429 Obsessive-compulsive disorder, unspecified: Secondary | ICD-10-CM | POA: Diagnosis not present

## 2017-09-09 DIAGNOSIS — F3281 Premenstrual dysphoric disorder: Secondary | ICD-10-CM | POA: Diagnosis not present

## 2017-09-09 DIAGNOSIS — F410 Panic disorder [episodic paroxysmal anxiety] without agoraphobia: Secondary | ICD-10-CM | POA: Diagnosis not present

## 2017-09-21 ENCOUNTER — Ambulatory Visit (INDEPENDENT_AMBULATORY_CARE_PROVIDER_SITE_OTHER): Payer: 59 | Admitting: Unknown Physician Specialty

## 2017-09-21 ENCOUNTER — Ambulatory Visit
Admission: RE | Admit: 2017-09-21 | Discharge: 2017-09-21 | Disposition: A | Discharge status: Home / Self Care | Payer: 59 | Source: Ambulatory Visit | Attending: Unknown Physician Specialty | Admitting: Unknown Physician Specialty

## 2017-09-21 ENCOUNTER — Encounter: Payer: Self-pay | Admitting: Unknown Physician Specialty

## 2017-09-21 VITALS — BP 113/74 | HR 88 | Temp 97.7°F | Ht 62.2 in | Wt 143.9 lb

## 2017-09-21 DIAGNOSIS — R200 Anesthesia of skin: Secondary | ICD-10-CM | POA: Insufficient documentation

## 2017-09-21 DIAGNOSIS — F410 Panic disorder [episodic paroxysmal anxiety] without agoraphobia: Secondary | ICD-10-CM | POA: Diagnosis not present

## 2017-09-21 DIAGNOSIS — R635 Abnormal weight gain: Secondary | ICD-10-CM | POA: Insufficient documentation

## 2017-09-21 DIAGNOSIS — M545 Low back pain, unspecified: Secondary | ICD-10-CM | POA: Insufficient documentation

## 2017-09-21 DIAGNOSIS — N200 Calculus of kidney: Secondary | ICD-10-CM | POA: Diagnosis not present

## 2017-09-21 DIAGNOSIS — M2550 Pain in unspecified joint: Secondary | ICD-10-CM

## 2017-09-21 DIAGNOSIS — Z7689 Persons encountering health services in other specified circumstances: Secondary | ICD-10-CM | POA: Diagnosis not present

## 2017-09-21 DIAGNOSIS — R5383 Other fatigue: Secondary | ICD-10-CM | POA: Diagnosis not present

## 2017-09-21 NOTE — Assessment & Plan Note (Signed)
Psychiatric provider wants lipids done.  She is not fasting.

## 2017-09-21 NOTE — Progress Notes (Addendum)
BP 113/74   Pulse 88   Temp 97.7 F (36.5 C) (Oral)   Ht 5' 2.2" (1.58 m)   Wt 143 lb 14.4 oz (65.3 kg)   SpO2 98%   BMI 26.15 kg/m    Subjective:    Patient ID: Katelyn Whitehead, female    DOB: Dec 23, 1990, 27 y.o.   MRN: [id number]  HPI: Katelyn Whitehead is a 27 y.o. female  Chief Complaint  Patient presents with  . Establish Care    pt states she has been very fatigued lately- requesting lab work including vitamin D, B12 and iron. Also states that she has been having back pain and bilateral big toe numbeness.    Pt is here for complaints of fatigue, back pain, and numbness on lateral sides of bilateral big toes.  She does have a history of B12 and Vit D deficiency but unable to get shots.  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    Back Pain  Chronicity: long standing but worse in the last few weeks.  The problem occurs constantly. The problem has been waxing and waning since onset. The pain is present in the lumbar spine. The quality of the pain is described as aching. The pain does not radiate. The symptoms are aggravated by bending. Associated symptoms include numbness. Pertinent negatives include no abdominal pain, bladder incontinence, bowel incontinence, chest pain, dysuria, fever, leg pain, paresis, paresthesias, pelvic pain, perianal numbness, tingling, weakness or weight loss. (Numbness in the middle of back) She has tried NSAIDs for the symptoms. The treatment provided mild relief.   Weight gain Pt having a significant weight gain of about 15 pounds in the last 6 months  Depression/anxiety Seeing psychiatric nurse practitioner in [place].  Doing well with current treatment.    Social History   Socioeconomic History  . Marital status: Married    Spouse name: Not on file  . Number of children: Not on file  . Years of education: Not on file  . Highest education level: Not on file  Social Needs  . Financial resource strain: Not on file  . Food  insecurity - worry: Not on file  . Food insecurity - inability: Not on file  . Transportation needs - medical: Not on file  . Transportation needs - non-medical: Not on file  Occupational History  . Not on file  Tobacco Use  . Smoking status: Never Smoker  . Smokeless tobacco: Never Used  Substance and Sexual Activity  . Alcohol use: No    Alcohol/week: 0.0 oz  . Drug use: No  . Sexual activity: Yes    Birth control/protection: None, Pill    Comment: mirena  Other Topics Concern  . Not on file  Social History Narrative  . Not on file   Family History  Problem Relation Age of Onset  . Depression Mother   . Drug abuse Father   . ADD / ADHD Brother   . Depression Maternal Aunt   . Dementia Maternal Grandmother   . Depression Maternal Grandmother    Past Medical History:  Diagnosis Date  . ADHD (attention deficit hyperactivity disorder)   . Anxiety   . Asthma    childhood  . Depression   . Mild mitral and aortic regurgitation    a. 04/2013 Echo: EF 50-55%, mild AI, mild MR, no MVP.  Marland Kitchen Palpitations    a. 2012 Holter: sinus tach, pvc's.  . Panic disorder  Social History   Socioeconomic History  . Marital status: Married    Spouse name: Not on file  . Number of children: Not on file  . Years of education: Not on file  . Highest education level: Not on file  Social Needs  . Financial resource strain: Not on file  . Food insecurity - worry: Not on file  . Food insecurity - inability: Not on file  . Transportation needs - medical: Not on file  . Transportation needs - non-medical: Not on file  Occupational History  . Not on file  Tobacco Use  . Smoking status: Never Smoker  . Smokeless tobacco: Never Used  Substance and Sexual Activity  . Alcohol use: No    Alcohol/week: 0.0 oz  . Drug use: No  . Sexual activity: Yes    Birth control/protection: None, Pill    Comment: mirena  Other Topics Concern  . Not on file  Social History Narrative  . Not on file       Relevant past medical, surgical, family and social history reviewed and updated as indicated. Interim medical history since our last visit reviewed. Allergies and medications reviewed and updated.  Review of Systems  Constitutional: Negative for fever and weight loss.  Cardiovascular: Negative for chest pain.  Gastrointestinal: Negative for abdominal pain and bowel incontinence.  Genitourinary: Negative for bladder incontinence, dysuria and pelvic pain.  Musculoskeletal: Positive for arthralgias and back pain.  Neurological: Positive for numbness. Negative for tingling, weakness and paresthesias.    Per HPI unless specifically indicated above     Objective:    BP 113/74   Pulse 88   Temp 97.7 F (36.5 C) (Oral)   Ht 5' 2.2" (1.58 m)   Wt 143 lb 14.4 oz (65.3 kg)   SpO2 98%   BMI 26.15 kg/m   Wt Readings from Last 3 Encounters:  09/21/17 143 lb 14.4 oz (65.3 kg)  01/20/17 128 lb (58.1 kg)  01/12/17 128 lb 8 oz (58.3 kg)    Physical Exam  Constitutional: She is oriented to person, place, and time. She appears well-developed and well-nourished. No distress.  HENT:  Head: Normocephalic and atraumatic.  Eyes: Conjunctivae and lids are normal. Right eye exhibits no discharge. Left eye exhibits no discharge. No scleral icterus.  Neck: Normal range of motion. Neck supple. No JVD present. Carotid bruit is not present.  Cardiovascular: Normal rate, regular rhythm and normal heart sounds.  Pulmonary/Chest: Effort normal and breath sounds normal.  Abdominal: Normal appearance. There is no splenomegaly or hepatomegaly.  Musculoskeletal: Normal range of motion.  Neurological: She is alert and oriented to person, place, and time.  Skin: Skin is warm, dry and intact. No rash noted. No pallor.  Psychiatric: She has a normal mood and affect. Her behavior is normal. Judgment and thought content normal.       Assessment & Plan:   Problem List Items Addressed This Visit       Unprioritized   Arthralgia    ANA, ESR, and CRP.  This comes and goes      Relevant Orders   Sed Rate (ESR)   ANA w/Reflex   Bilateral low back pain without sciatica    Makes the pt anxious.  Will check back x-ray.  Discussed yoga for back pain      Relevant Orders   DG Lumbar Spine Complete   Fatigue    Pt with persistent fatigue, probably related to life stage issues.  But history of  B12 and Vit D issues      Relevant Orders   CBC with Differential/Platelet   Comprehensive metabolic panel   TSH   VITAMIN D 25 Hydroxy (Vit-D Deficiency, Fractures)   Vitamin B12   Numbness of toes    Nonspecific finding.  Check B12.  Not a radicular symptom      Panic disorder    Under care of psychiatry      Relevant Medications   LORazepam (ATIVAN) 0.5 MG tablet   sertraline (ZOLOFT) 100 MG tablet   Weight gain - Primary    Psychiatric provider wants lipids done.  She is not fasting.        Relevant Orders   Lipid Panel w/o Chol/HDL Ratio    Other Visit Diagnoses    Encounter to establish care           Follow up plan: Return for PE.

## 2017-09-21 NOTE — Assessment & Plan Note (Addendum)
Makes the pt anxious.  Will check back x-ray.  Discussed yoga for back pain

## 2017-09-21 NOTE — Assessment & Plan Note (Signed)
Under care of psychiatry  

## 2017-09-21 NOTE — Assessment & Plan Note (Signed)
Nonspecific finding.  Check B12.  Not a radicular symptom

## 2017-09-21 NOTE — Assessment & Plan Note (Signed)
Pt with persistent fatigue, probably related to life stage issues.  But history of B12 and Vit D issues

## 2017-09-21 NOTE — Assessment & Plan Note (Signed)
ANA, ESR, and CRP.  This comes and goes

## 2017-09-22 LAB — CBC WITH DIFFERENTIAL/PLATELET
BASOS ABS: 0.1 10*3/uL (ref 0.0–0.2)
Basos: 1 %
EOS (ABSOLUTE): 0.4 10*3/uL (ref 0.0–0.4)
Eos: 6 %
Hematocrit: 39.1 % (ref 34.0–46.6)
Hemoglobin: 12.8 g/dL (ref 11.1–15.9)
Immature Grans (Abs): 0 10*3/uL (ref 0.0–0.1)
Immature Granulocytes: 0 %
LYMPHS ABS: 2 10*3/uL (ref 0.7–3.1)
Lymphs: 34 %
MCH: 27.5 pg (ref 26.6–33.0)
MCHC: 32.7 g/dL (ref 31.5–35.7)
MCV: 84 fL (ref 79–97)
Monocytes Absolute: 0.4 10*3/uL (ref 0.1–0.9)
Monocytes: 7 %
NEUTROS ABS: 3.1 10*3/uL (ref 1.4–7.0)
Neutrophils: 52 %
PLATELETS: 358 10*3/uL (ref 150–379)
RBC: 4.65 x10E6/uL (ref 3.77–5.28)
RDW: 14 % (ref 12.3–15.4)
WBC: 6 10*3/uL (ref 3.4–10.8)

## 2017-09-22 LAB — COMPREHENSIVE METABOLIC PANEL
ALK PHOS: 111 IU/L (ref 39–117)
ALT: 13 IU/L (ref 0–32)
AST: 18 IU/L (ref 0–40)
Albumin/Globulin Ratio: 1.7 (ref 1.2–2.2)
Albumin: 4.5 g/dL (ref 3.5–5.5)
BUN / CREAT RATIO: 21 (ref 9–23)
BUN: 15 mg/dL (ref 6–20)
CHLORIDE: 103 mmol/L (ref 96–106)
CO2: 23 mmol/L (ref 20–29)
Calcium: 9.6 mg/dL (ref 8.7–10.2)
Creatinine, Ser: 0.72 mg/dL (ref 0.57–1.00)
GFR calc Af Amer: 132 mL/min/{1.73_m2} (ref >59)
GFR calc non Af Amer: 114 mL/min/{1.73_m2} (ref >59)
GLUCOSE: 86 mg/dL (ref 65–99)
Globulin, Total: 2.7 g/dL (ref 1.5–4.5)
Potassium: 4.2 mmol/L (ref 3.5–5.2)
Sodium: 142 mmol/L (ref 134–144)
Total Protein: 7.2 g/dL (ref 6.0–8.5)

## 2017-09-22 LAB — LIPID PANEL W/O CHOL/HDL RATIO
Cholesterol, Total: 172 mg/dL (ref 100–199)
HDL: 60 mg/dL (ref >39)
LDL Calculated: 96 mg/dL (ref 0–99)
Triglycerides: 81 mg/dL (ref 0–149)
VLDL Cholesterol Cal: 16 mg/dL (ref 5–40)

## 2017-09-22 LAB — SEDIMENTATION RATE: SED RATE: 14 mm/h (ref 0–32)

## 2017-09-22 LAB — ANA W/REFLEX: Anti Nuclear Antibody(ANA): NEGATIVE

## 2017-09-22 LAB — VITAMIN B12: Vitamin B-12: 353 pg/mL (ref 232–1245)

## 2017-09-22 LAB — TSH: TSH: 1.99 u[IU]/mL (ref 0.450–4.500)

## 2017-09-22 LAB — VITAMIN D 25 HYDROXY (VIT D DEFICIENCY, FRACTURES): VIT D 25 HYDROXY: 25 ng/mL — AB (ref 30.0–100.0)

## 2017-09-22 NOTE — Progress Notes (Signed)
Pt notified through mychart.

## 2017-09-23 ENCOUNTER — Encounter: Payer: Self-pay | Admitting: Unknown Physician Specialty

## 2017-10-14 DIAGNOSIS — F53 Postpartum depression: Secondary | ICD-10-CM | POA: Diagnosis not present

## 2017-10-14 DIAGNOSIS — F429 Obsessive-compulsive disorder, unspecified: Secondary | ICD-10-CM | POA: Diagnosis not present

## 2017-10-18 ENCOUNTER — Encounter: Payer: 59 | Admitting: Unknown Physician Specialty

## 2017-12-02 DIAGNOSIS — F53 Postpartum depression: Secondary | ICD-10-CM | POA: Diagnosis not present

## 2017-12-02 DIAGNOSIS — F429 Obsessive-compulsive disorder, unspecified: Secondary | ICD-10-CM | POA: Diagnosis not present

## 2018-01-05 ENCOUNTER — Encounter: Payer: Self-pay | Admitting: Unknown Physician Specialty

## 2018-01-13 DIAGNOSIS — F429 Obsessive-compulsive disorder, unspecified: Secondary | ICD-10-CM | POA: Diagnosis not present

## 2018-01-17 ENCOUNTER — Encounter: Payer: 59 | Admitting: Unknown Physician Specialty

## 2018-01-23 ENCOUNTER — Encounter: Payer: 59 | Admitting: Physician Assistant

## 2018-01-27 ENCOUNTER — Encounter: Payer: Self-pay | Admitting: Physician Assistant

## 2018-01-27 ENCOUNTER — Ambulatory Visit (INDEPENDENT_AMBULATORY_CARE_PROVIDER_SITE_OTHER): Payer: 59 | Admitting: Physician Assistant

## 2018-01-27 VITALS — BP 99/67 | HR 88 | Ht 60.0 in | Wt 151.0 lb

## 2018-01-27 DIAGNOSIS — Z0000 Encounter for general adult medical examination without abnormal findings: Secondary | ICD-10-CM

## 2018-01-27 NOTE — Patient Instructions (Signed)

## 2018-01-27 NOTE — Progress Notes (Signed)
Subjective:    Patient ID: Katelyn Whitehead, female    DOB: 03-Jan-1991, 27 y.o.   MRN: [id number]  Katelyn Whitehead is a 27 y.o. female presenting on 01/27/2018 for Annual Exam   HPI   Has had weight gain with zoloft. Sees Alessandra Grout at [facility]. She may be leaving to do travel clinics.  Has 56 month old, still breast feeding. Had some post partum depression with this.   Works for the [employer] as a Quarry manager.  Due for PAP but would like to follow up to do this.  Is concerned about weight gain. She wonders if it may be due to medication for depression. She continues to breast feed her daughter and has a couple of night time awakenings due to this. Frequently gets hungry at night and will eat things like cookies and milk.   Recent labwork normal but for some low vitamin D.    Past Medical History:  Diagnosis Date  . ADHD (attention deficit hyperactivity disorder)   . Anxiety   . Asthma    childhood  . Depression   . Mild mitral and aortic regurgitation    a. 04/2013 Echo: EF 50-55%, mild AI, mild MR, no MVP.  Marland Kitchen Palpitations    a. 2012 Holter: sinus tach, pvc's.  . Panic disorder    Past Surgical History:  Procedure Laterality Date  . IUD REMOVAL N/A 11/22/2016   Procedure: LAPAROCOPIC INTRAUTERINE DEVICE (IUD) REMOVAL;  Surgeon: Harlin Heys, MD;  Location: [facility] ORS;  Service: Gynecology;  Laterality: N/A;  . WISDOM TOOTH EXTRACTION     Social History   Socioeconomic History  . Marital status: Married    Spouse name: Not on file  . Number of children: Not on file  . Years of education: Not on file  . Highest education level: Not on file  Occupational History  . Not on file  Social Needs  . Financial resource strain: Not on file  . Food insecurity:    Worry: Not on file    Inability: Not on file  . Transportation needs:    Medical: Not on file    Non-medical: Not on file  Tobacco Use  . Smoking status: Never Smoker  . Smokeless tobacco: Never Used    Substance and Sexual Activity  . Alcohol use: No    Alcohol/week: 0.0 oz  . Drug use: No  . Sexual activity: Yes    Birth control/protection: None, Pill    Comment: mirena  Lifestyle  . Physical activity:    Days per week: Not on file    Minutes per session: Not on file  . Stress: Not on file  Relationships  . Social connections:    Talks on phone: Not on file    Gets together: Not on file    Attends religious service: Not on file    Active member of club or organization: Not on file    Attends meetings of clubs or organizations: Not on file    Relationship status: Not on file  . Intimate partner violence:    Fear of current or ex partner: Not on file    Emotionally abused: Not on file    Physically abused: Not on file    Forced sexual activity: Not on file  Other Topics Concern  . Not on file  Social History Narrative  . Not on file   Family History  Problem Relation Age of Onset  . Depression Mother   .  Drug abuse Father   . ADD / ADHD Brother   . Depression Maternal Aunt   . Dementia Maternal Grandmother   . Depression Maternal Grandmother    Current Outpatient Medications on File Prior to Visit  Medication Sig  . L-methylfolate Calcium 15 MG TABS   . LORazepam (ATIVAN) 0.5 MG tablet Take 1 tab twice daily (9 am and 3 pm)  . sertraline (ZOLOFT) 100 MG tablet Take 200 mg by mouth daily.  . sertraline (ZOLOFT) 25 MG tablet    No current facility-administered medications on file prior to visit.     Review of Systems Per HPI unless specifically indicated above     Objective:    BP 99/67   Pulse 88   Ht 5' (1.524 m)   Wt 151 lb (68.5 kg)   SpO2 98%   BMI 29.49 kg/m   Wt Readings from Last 3 Encounters:  01/27/18 151 lb (68.5 kg)  09/21/17 143 lb 14.4 oz (65.3 kg)  01/20/17 128 lb (58.1 kg)    Physical Exam  Constitutional: She is oriented to person, place, and time. She appears well-developed and well-nourished.  Cardiovascular: Normal rate and  regular rhythm.  Pulmonary/Chest: Effort normal and breath sounds normal.  Abdominal: Soft. Bowel sounds are normal.  Neurological: She is alert and oriented to person, place, and time.  Skin: Skin is warm and dry.  Psychiatric: She has a normal mood and affect. Her behavior is normal.   Results for orders placed or performed in visit on 09/21/17  CBC with Differential/Platelet  Result Value Ref Range   WBC 6.0 3.4 - 10.8 x10E3/uL   RBC 4.65 3.77 - 5.28 x10E6/uL   Hemoglobin 12.8 11.1 - 15.9 g/dL   Hematocrit 39.1 34.0 - 46.6 %   MCV 84 79 - 97 fL   MCH 27.5 26.6 - 33.0 pg   MCHC 32.7 31.5 - 35.7 g/dL   RDW 14.0 12.3 - 15.4 %   Platelets 358 150 - 379 x10E3/uL   Neutrophils 52 Not Estab. %   Lymphs 34 Not Estab. %   Monocytes 7 Not Estab. %   Eos 6 Not Estab. %   Basos 1 Not Estab. %   Neutrophils Absolute 3.1 1.4 - 7.0 x10E3/uL   Lymphocytes Absolute 2.0 0.7 - 3.1 x10E3/uL   Monocytes Absolute 0.4 0.1 - 0.9 x10E3/uL   EOS (ABSOLUTE) 0.4 0.0 - 0.4 x10E3/uL   Basophils Absolute 0.1 0.0 - 0.2 x10E3/uL   Immature Granulocytes 0 Not Estab. %   Immature Grans (Abs) 0.0 0.0 - 0.1 x10E3/uL  Comprehensive metabolic panel  Result Value Ref Range   Glucose 86 65 - 99 mg/dL   BUN 15 6 - 20 mg/dL   Creatinine, Ser 0.72 0.57 - 1.00 mg/dL   GFR calc non Af Amer 114 >59 mL/min/1.73   GFR calc Af Amer 132 >59 mL/min/1.73   BUN/Creatinine Ratio 21 9 - 23   Sodium 142 134 - 144 mmol/L   Potassium 4.2 3.5 - 5.2 mmol/L   Chloride 103 96 - 106 mmol/L   CO2 23 20 - 29 mmol/L   Calcium 9.6 8.7 - 10.2 mg/dL   Total Protein 7.2 6.0 - 8.5 g/dL   Albumin 4.5 3.5 - 5.5 g/dL   Globulin, Total 2.7 1.5 - 4.5 g/dL   Albumin/Globulin Ratio 1.7 1.2 - 2.2   Bilirubin Total <0.2 0.0 - 1.2 mg/dL   Alkaline Phosphatase 111 39 - 117 IU/L   AST 18 0 -  40 IU/L   ALT 13 0 - 32 IU/L  TSH  Result Value Ref Range   TSH 1.990 0.450 - 4.500 uIU/mL  VITAMIN D 25 Hydroxy (Vit-D Deficiency, Fractures)  Result  Value Ref Range   Vit D, 25-Hydroxy 25.0 (L) 30.0 - 100.0 ng/mL  Vitamin B12  Result Value Ref Range   Vitamin B-12 353 232 - 1,245 pg/mL  Sed Rate (ESR)  Result Value Ref Range   Sed Rate 14 0 - 32 mm/hr  ANA w/Reflex  Result Value Ref Range   Anti Nuclear Antibody(ANA) Negative Negative  Lipid Panel w/o Chol/HDL Ratio  Result Value Ref Range   Cholesterol, Total 172 100 - 199 mg/dL   Triglycerides 81 0 - 149 mg/dL   HDL 60 >39 mg/dL   VLDL Cholesterol Cal 16 5 - 40 mg/dL   LDL Calculated 96 0 - 99 mg/dL      Assessment & Plan:   1. Annual physical exam  Can do 1000 iu daily vitamin D. Continue follow up for depression, may need local provider but wants to see if she can still see her current provider. Return for PAP.   Follow up plan: Return in about 1 month (around 02/26/2018) for PAP.  Carles Collet, PA-C [facility] 02/10/2018, 2:59 PM

## 2018-02-28 ENCOUNTER — Ambulatory Visit: Payer: 59 | Admitting: Unknown Physician Specialty

## 2018-03-01 DIAGNOSIS — F900 Attention-deficit hyperactivity disorder, predominantly inattentive type: Secondary | ICD-10-CM | POA: Diagnosis not present

## 2018-03-01 DIAGNOSIS — F3281 Premenstrual dysphoric disorder: Secondary | ICD-10-CM | POA: Diagnosis not present

## 2018-03-01 DIAGNOSIS — F429 Obsessive-compulsive disorder, unspecified: Secondary | ICD-10-CM | POA: Diagnosis not present

## 2018-03-16 ENCOUNTER — Ambulatory Visit (INDEPENDENT_AMBULATORY_CARE_PROVIDER_SITE_OTHER): Payer: Self-pay | Admitting: Family Medicine

## 2018-03-16 ENCOUNTER — Encounter: Payer: Self-pay | Admitting: Family Medicine

## 2018-03-16 VITALS — BP 98/70 | HR 114 | Temp 98.4°F | Wt 150.0 lb

## 2018-03-16 DIAGNOSIS — R52 Pain, unspecified: Secondary | ICD-10-CM

## 2018-03-16 LAB — POCT INFLUENZA A/B
Influenza A, POC: NEGATIVE
Influenza B, POC: NEGATIVE

## 2018-03-16 MED ORDER — LORATADINE 10 MG PO CAPS: 10.0000 mg | ORAL_CAPSULE | Freq: Every day | ORAL | 1 refills | Status: DC

## 2018-03-16 NOTE — Patient Instructions (Signed)
I recommend Claritin 10 mg once daily at bedtime until congestion resolves.  I recommend continuing ibuprofen 400 mg every 6 hours OTC as needed for body aches.  If body aches persist over 48 hours follow-up with your PCP or Urgent Care for further work-up and evaluation.    Viral Illness, Adult Viruses are tiny germs that can get into a person's body and cause illness. There are many different types of viruses, and they cause many types of illness. Viral illnesses can range from mild to severe. They can affect various parts of the body. Common illnesses that are caused by a virus include colds and the flu. Viral illnesses also include serious conditions such as HIV/AIDS (human immunodeficiency virus/acquired immunodeficiency syndrome). A few viruses have been linked to certain cancers. What are the causes? Many types of viruses can cause illness. Viruses invade cells in your body, multiply, and cause the infected cells to malfunction or die. When the cell dies, it releases more of the virus. When this happens, you develop symptoms of the illness, and the virus continues to spread to other cells. If the virus takes over the function of the cell, it can cause the cell to divide and grow out of control, as is the case when a virus causes cancer. Different viruses get into the body in different ways. You can get a virus by:  Swallowing food or water that is contaminated with the virus.  Breathing in droplets that have been coughed or sneezed into the air by an infected person.  Touching a surface that has been contaminated with the virus and then touching your eyes, nose, or mouth.  Being bitten by an insect or animal that carries the virus.  Having sexual contact with a person who is infected with the virus.  Being exposed to blood or fluids that contain the virus, either through an open cut or during a transfusion.  If a virus enters your body, your body's defense system (immune system)  will try to fight the virus. You may be at higher risk for a viral illness if your immune system is weak. What are the signs or symptoms? Symptoms vary depending on the type of virus and the location of the cells that it invades. Common symptoms of the main types of viral illnesses include: Cold and flu viruses  Fever.  Headache.  Sore throat.  Muscle aches.  Nasal congestion.  Cough. Digestive system (gastrointestinal) viruses  Fever.  Abdominal pain.  Nausea.  Diarrhea. Liver viruses (hepatitis)  Loss of appetite.  Tiredness.  Yellowing of the skin (jaundice). Brain and spinal cord viruses  Fever.  Headache.  Stiff neck.  Nausea and vomiting.  Confusion or sleepiness. Skin viruses  Warts.  Itching.  Rash. Sexually transmitted viruses  Discharge.  Swelling.  Redness.  Rash. How is this treated? Viruses can be difficult to treat because they live within cells. Antibiotic medicines do not treat viruses because these drugs do not get inside cells. Treatment for a viral illness may include:  Resting and drinking plenty of fluids.  Medicines to relieve symptoms. These can include over-the-counter medicine for pain and fever, medicines for cough or congestion, and medicines to relieve diarrhea.  Antiviral medicines. These drugs are available only for certain types of viruses. They may help reduce flu symptoms if taken early. There are also many antiviral medicines for hepatitis and HIV/AIDS.  Some viral illnesses can be prevented with vaccinations. A common example is the flu shot. Follow these instructions  at home: Medicines   Take over-the-counter and prescription medicines only as told by your health care provider.  If you were prescribed an antiviral medicine, take it as told by your health care provider. Do not stop taking the medicine even if you start to feel better.  Be aware of when antibiotics are needed and when they are not needed.  Antibiotics do not treat viruses. If your health care provider thinks that you may have a bacterial infection as well as a viral infection, you may get an antibiotic. ? Do not ask for an antibiotic prescription if you have been diagnosed with a viral illness. That will not make your illness go away faster. ? Frequently taking antibiotics when they are not needed can lead to antibiotic resistance. When this develops, the medicine no longer works against the bacteria that it normally fights. General instructions  Drink enough fluids to keep your urine clear or pale yellow.  Rest as much as possible.  Return to your normal activities as told by your health care provider. Ask your health care provider what activities are safe for you.  Keep all follow-up visits as told by your health care provider. This is important. How is this prevented? Take these actions to reduce your risk of viral infection:  Eat a healthy diet and get enough rest.  Wash your hands often with soap and water. This is especially important when you are in public places. If soap and water are not available, use hand sanitizer.  Avoid close contact with friends and family who have a viral illness.  If you travel to areas where viral gastrointestinal infection is common, avoid drinking water or eating raw food.  Keep your immunizations up to date. Get a flu shot every year as told by your health care provider.  Do not share toothbrushes, nail clippers, razors, or needles with other people.  Always practice safe sex.  Contact a health care provider if:  You have symptoms of a viral illness that do not go away.  Your symptoms come back after going away.  Your symptoms get worse. Get help right away if:  You have trouble breathing.  You have a severe headache or a stiff neck.  You have severe vomiting or abdominal pain. This information is not intended to replace advice given to you by your health care provider.  Make sure you discuss any questions you have with your health care provider. Document Released: 12/05/2015 Document Revised: 01/07/2016 Document Reviewed: 12/05/2015 Elsevier Interactive Patient Education  [employer].

## 2018-03-16 NOTE — Progress Notes (Signed)
Patient ID: Katelyn SalinaKatie O Hoe, female    DOB: April 16, 1991, 27 y.o.   MRN: [id number]  PCP: Gabriel CirriWicker, Cheryl, NP  Chief Complaint  Patient presents with  . Generalized Body Aches    1 day   Subjective:  HPI Katelyn Whitehead is a 27 y.o. female presents for evaluation body aches x 1 day. Concern for the flu. 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 Reports measurable low-grade fever 99.5 x 1 day ago. She has some mild sinus drainage, otherwise negative for URI symptoms.  Social History   Socioeconomic History  . Marital status: Married    Spouse name: Not on file  . Number of children: Not on file  . Years of education: Not on file  . Highest education level: Not on file  Occupational History  . Not on file  Social Needs  . Financial resource strain: Not on file  . Food insecurity:    Worry: Not on file    Inability: Not on file  . Transportation needs:    Medical: Not on file    Non-medical: Not on file  Tobacco Use  . Smoking status: Never Smoker  . Smokeless tobacco: Never Used  Substance and Sexual Activity  . Alcohol use: No    Alcohol/week: 0.0 standard drinks  . Drug use: No  . Sexual activity: Yes    Birth control/protection: None, Pill    Comment: mirena  Lifestyle  . Physical activity:    Days per week: Not on file    Minutes per session: Not on file  . Stress: Not on file  Relationships  . Social connections:    Talks on phone: Not on file    Gets together: Not on file    Attends religious service: Not on file    Active member of club or organization: Not on file    Attends meetings of clubs or organizations: Not on file    Relationship status: Not on file  . Intimate partner violence:    Fear of current or ex partner: Not on file    Emotionally abused: Not on file    Physically abused: Not on file    Forced sexual activity: Not on file  Other Topics Concern  . Not on file  Social  History Narrative  . Not on file    Family History  Problem Relation Age of Onset  . Depression Mother   . Drug abuse Father   . ADD / ADHD Brother   . Depression Maternal Aunt   . Dementia Maternal Grandmother   . Depression Maternal Grandmother    Review of Systems Pertinent negatives listed in HPI Patient Active Problem List   Diagnosis Date Noted  . Fatigue 09/21/2017  . Weight gain 09/21/2017  . Numbness of toes 09/21/2017  . Bilateral low back pain without sciatica 09/21/2017  . Arthralgia 09/21/2017  . Vitamin D deficiency 05/13/2016  . B12 deficiency 05/13/2016  . Mild mitral and aortic regurgitation   . Panic disorder   . Depression, major, recurrent, moderate (HCC) 12/04/2014    Allergies  Allergen Reactions  . Influenza Vaccines Hives  . Sulfa Antibiotics     Hives Rash    Prior to Admission medications   Medication Sig Start Date End Date Taking? Authorizing Provider  L-methylfolate Calcium 15 MG TABS  01/13/18  Yes [provider]  sertraline (ZOLOFT) 100 MG tablet Take 200 mg by mouth daily.  09/09/17 09/09/18 Yes [provider]  sertraline (ZOLOFT) 25 MG tablet  01/13/18  Yes [provider]    Past Medical, Surgical Family and Social History reviewed and updated.    Objective:   Today's Vitals   03/16/18 1326  BP: 98/70  Pulse: (!) 114  Temp: 98.4 F (36.9 C)  SpO2: 98%  Weight: 150 lb (68 kg)    Wt Readings from Last 3 Encounters:  03/16/18 150 lb (68 kg)  01/27/18 151 lb (68.5 kg)  09/21/17 143 lb 14.4 oz (65.3 kg)    Physical Exam  Constitutional: She appears well-developed and well-nourished. She does not have a sickly appearance.  HENT:  Head: Normocephalic and atraumatic.  Right Ear: Hearing, tympanic membrane, external ear and ear canal normal.  Left Ear: Hearing, tympanic membrane, external ear and ear canal normal.  Nose: Mucosal edema and rhinorrhea present.  Mouth/Throat: Uvula is midline.   Cardiovascular: Tachycardia present.  Pulmonary/Chest: Effort normal and breath sounds normal.  Lymphadenopathy:  Negative for lymphadenopathy  Psychiatric: She has a normal mood and affect. Her speech is normal and behavior is normal. Judgment and thought content normal. Cognition and memory are normal.   Assessment & Plan:  1. Body aches.-POCT Influenza A/B negative. Physical exam finding only significant for nasal mucosal edema with mild rhinorrhea.Recommended conservative therapy for now. Continue Ibuprofen 400 mg every 6 hours as needed for body-aches. I recommend starting Claritin 10 mg daily at bedtime for management of allergic rhinitis.   Meds ordered this encounter  Medications  . Loratadine 10 MG CAPS    Sig: Take 1 capsule (10 mg total) by mouth at bedtime.    Dispense:  30 each    Refill:  1   If symptoms worsen or do not improve, return for follow-up, follow-up with PCP, or at the emergency department if severity of symptoms warrant a higher level of care.   Godfrey Pick. Tiburcio Pea, MSN, FNP-C [facility]  [address]  [place], [place] [address] [phone]

## 2018-03-19 DIAGNOSIS — J0140 Acute pansinusitis, unspecified: Secondary | ICD-10-CM | POA: Diagnosis not present

## 2018-05-10 DIAGNOSIS — F3341 Major depressive disorder, recurrent, in partial remission: Secondary | ICD-10-CM | POA: Diagnosis not present

## 2018-05-10 DIAGNOSIS — F429 Obsessive-compulsive disorder, unspecified: Secondary | ICD-10-CM | POA: Diagnosis not present

## 2018-05-10 DIAGNOSIS — F900 Attention-deficit hyperactivity disorder, predominantly inattentive type: Secondary | ICD-10-CM | POA: Diagnosis not present

## 2018-05-30 IMAGING — CT CT HEAD W/O CM
3 series · 16 of 47 positions shown, 19 images · non-contrast
Comparison: None.

CLINICAL DATA: Headache for 3-4 months.  Pregnant patient.

EXAM:
CT HEAD WITHOUT CONTRAST
TECHNIQUE: Contiguous axial images were obtained from the base of the skull
through the vertex without intravenous contrast.

[Series 2: head wo · axial · 0.42mm/px · z∈[+442,+567]mm · 10 of 30 slices shown, 13 images]
[im 3/30  brain]
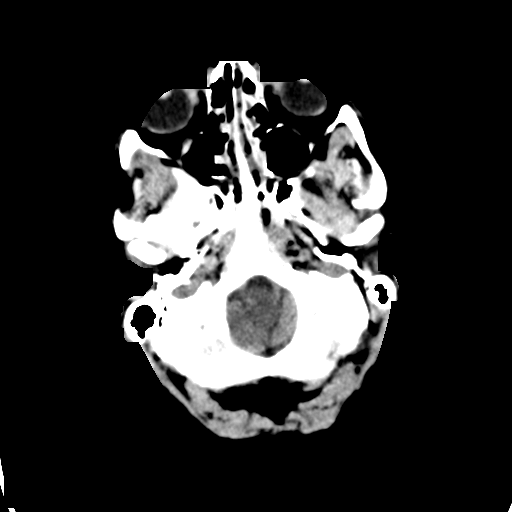
[im 3/30  bone]
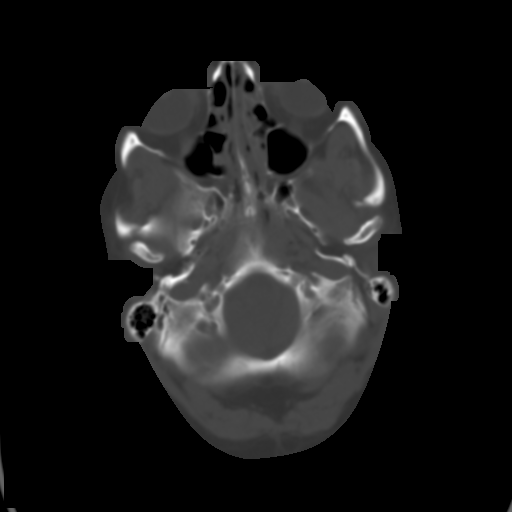
[im 6/30  brain]
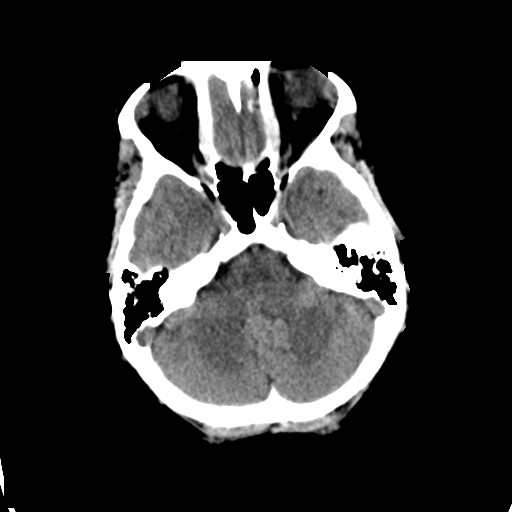
[im 9/30  brain]
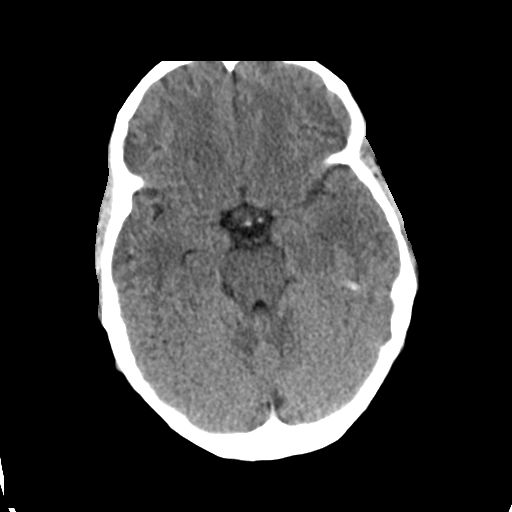
[im 11/30  brain]
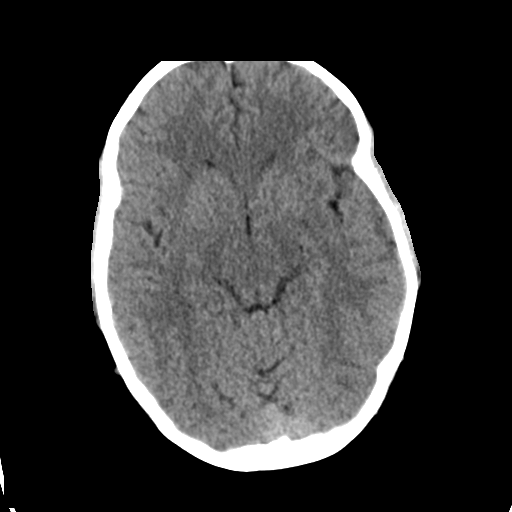
[im 14/30  brain]
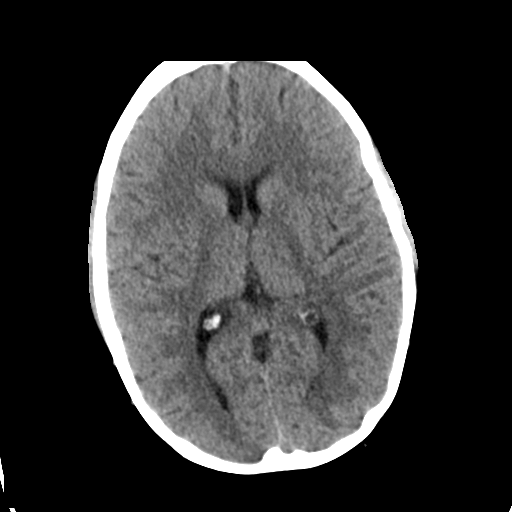
[im 14/30  bone]
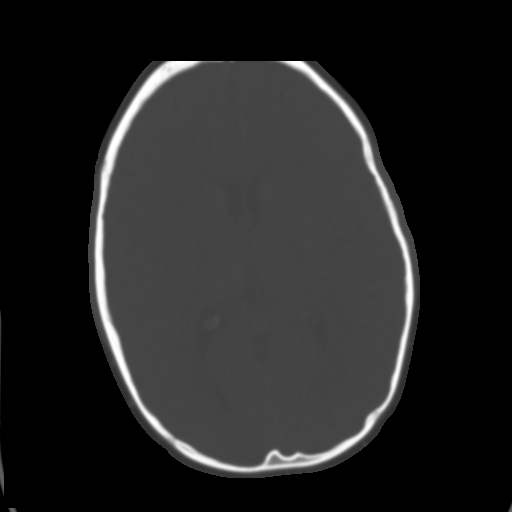
[im 17/30  brain]
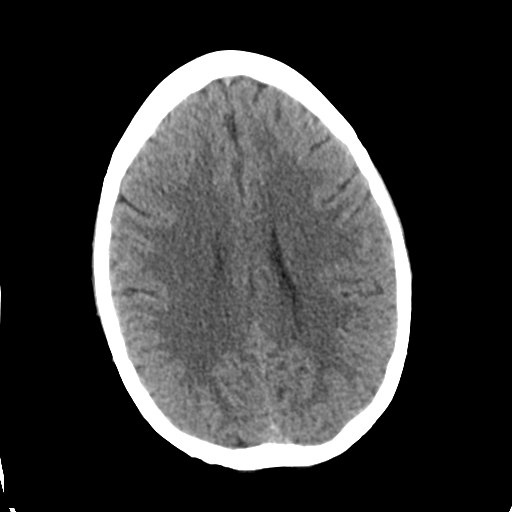
[im 20/30  brain]
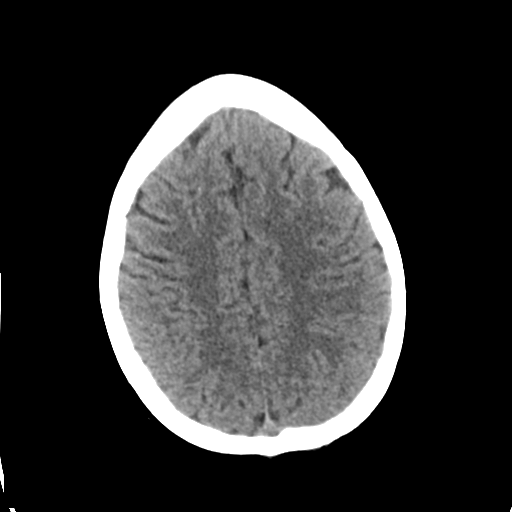
[im 23/30  brain]
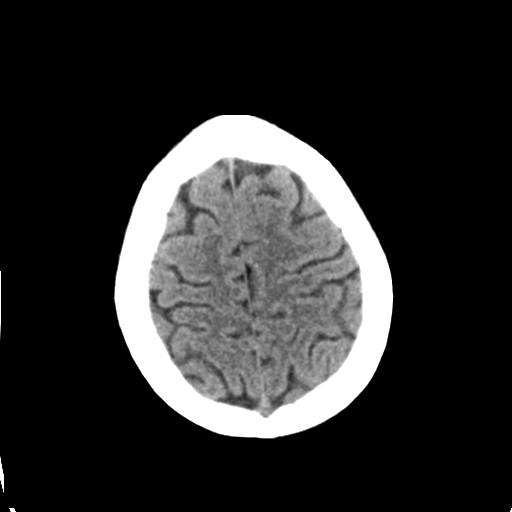
[im 25/30  brain]
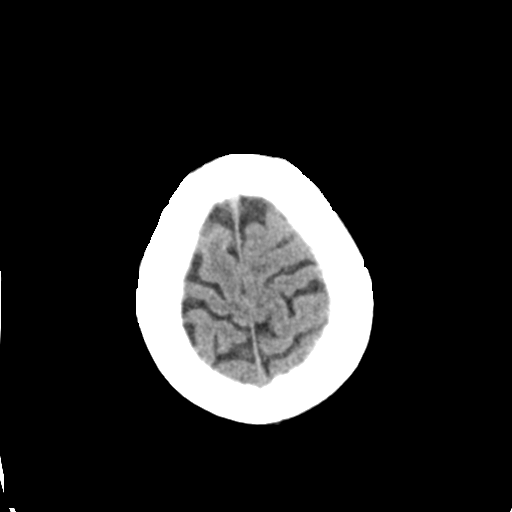
[im 25/30  bone]
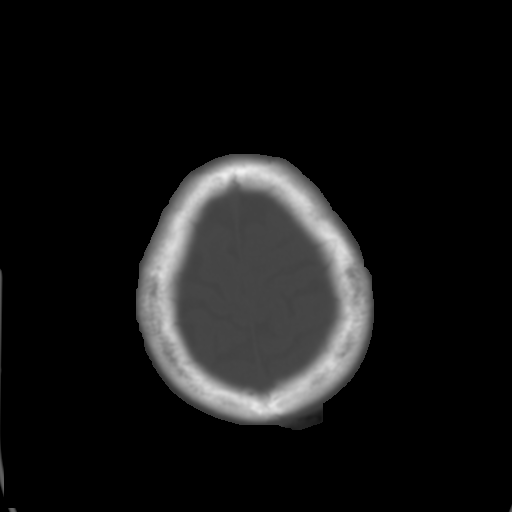
[im 28/30  brain]
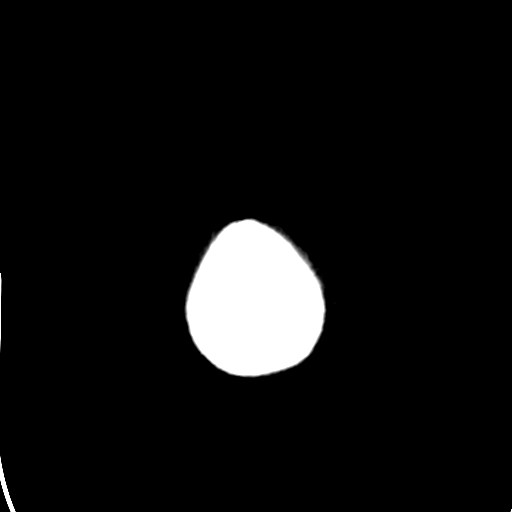

[Series 4: coronal soft tissue · coronal · 0.31mm/px · 3 of 64 slices shown]
[im 22/64  brain]
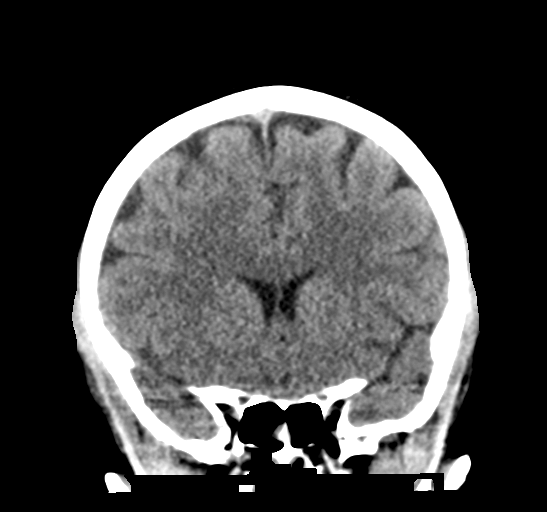
[im 29/64  brain]
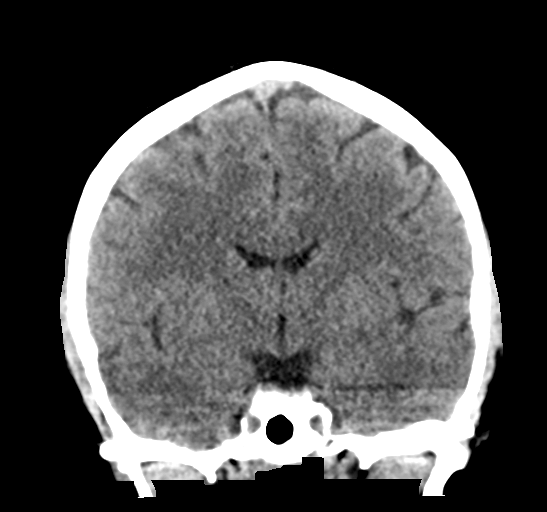
[im 36/64  brain]
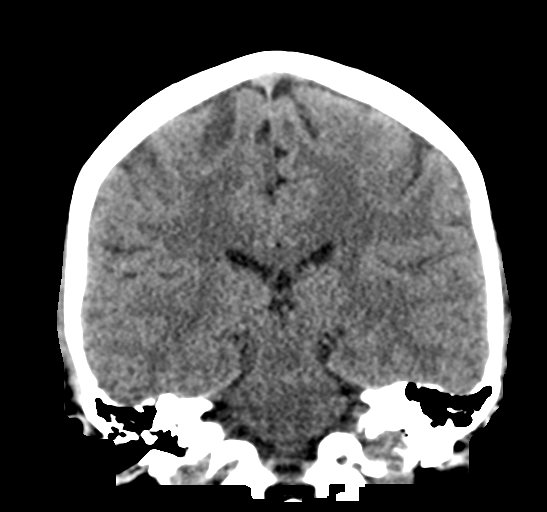

[Series 5: sagittal soft tissue · sagittal · 0.32mm/px · 3 of 48 slices shown]
[im 16/48  brain]
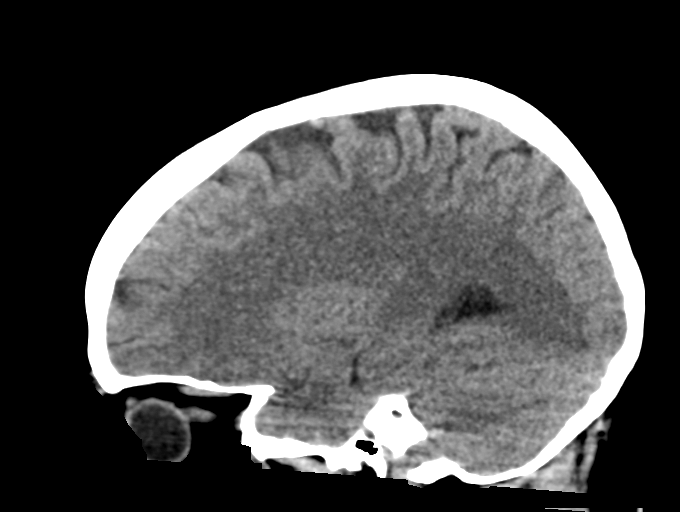
[im 24/48  brain]
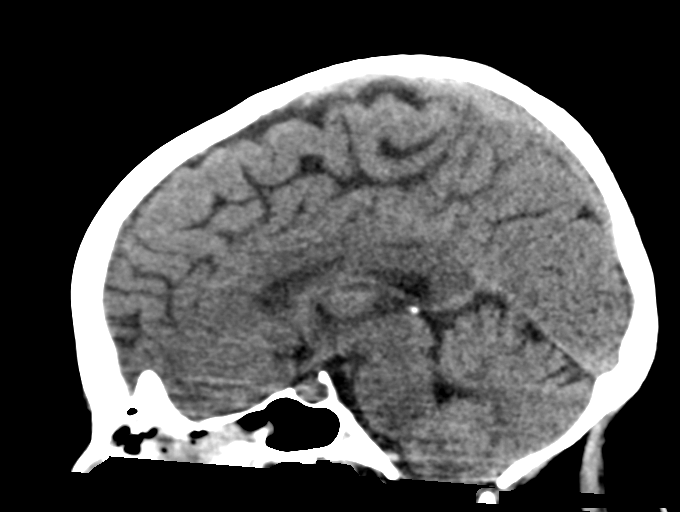
[im 32/48  brain]
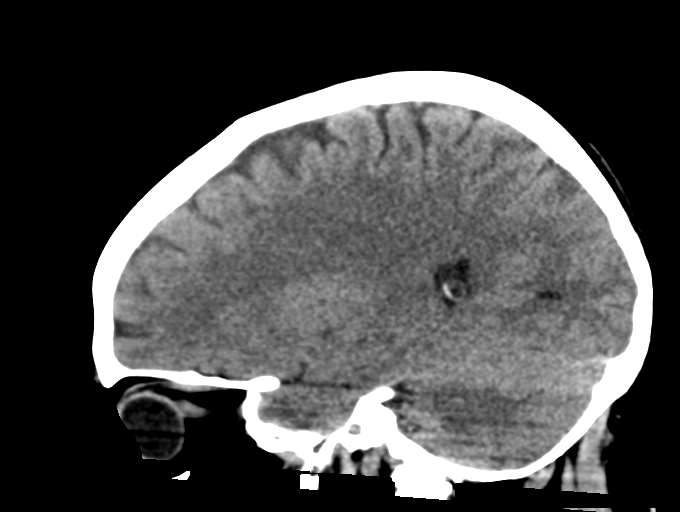

[16 of 47 positions shown; findings below may reference images not displayed]

FINDINGS: Brain: No mass lesion, intraparenchymal hemorrhage or extra-axial
collection. No evidence of acute cortical infarct. Brain parenchyma
and CSF-containing spaces are normal for age.

Vascular: No hyperdense vessel or unexpected calcification.

Skull: Normal visualized skull base, calvarium and extracranial soft
tissues.

Sinuses/Orbits: No sinus fluid levels or advanced mucosal
thickening. No mastoid effusion. Normal orbits.
IMPRESSION: Normal head CT.

## 2018-06-16 DIAGNOSIS — F3341 Major depressive disorder, recurrent, in partial remission: Secondary | ICD-10-CM | POA: Diagnosis not present

## 2018-06-16 DIAGNOSIS — F900 Attention-deficit hyperactivity disorder, predominantly inattentive type: Secondary | ICD-10-CM | POA: Diagnosis not present

## 2018-06-16 DIAGNOSIS — F429 Obsessive-compulsive disorder, unspecified: Secondary | ICD-10-CM | POA: Diagnosis not present

## 2018-07-31 ENCOUNTER — Ambulatory Visit (INDEPENDENT_AMBULATORY_CARE_PROVIDER_SITE_OTHER): Payer: Self-pay | Admitting: Physician Assistant

## 2018-07-31 ENCOUNTER — Encounter: Payer: Self-pay | Admitting: Physician Assistant

## 2018-07-31 VITALS — BP 110/80 | HR 86 | Temp 97.5°F | Resp 16 | Ht 60.0 in | Wt 157.0 lb

## 2018-07-31 DIAGNOSIS — H6983 Other specified disorders of Eustachian tube, bilateral: Secondary | ICD-10-CM

## 2018-07-31 DIAGNOSIS — H6993 Unspecified Eustachian tube disorder, bilateral: Secondary | ICD-10-CM

## 2018-07-31 DIAGNOSIS — M62830 Muscle spasm of back: Secondary | ICD-10-CM

## 2018-07-31 DIAGNOSIS — M5416 Radiculopathy, lumbar region: Secondary | ICD-10-CM

## 2018-07-31 DIAGNOSIS — S39012A Strain of muscle, fascia and tendon of lower back, initial encounter: Secondary | ICD-10-CM

## 2018-07-31 MED ORDER — LORATADINE-PSEUDOEPHEDRINE ER 5-120 MG PO TB12: 1.0000 | ORAL_TABLET | Freq: Two times a day (BID) | ORAL | 0 refills | Status: DC

## 2018-07-31 MED ORDER — FLUTICASONE PROPIONATE 50 MCG/ACT NA SUSP: 2.0000 | Freq: Every day | NASAL | 0 refills | Status: DC

## 2018-07-31 MED ORDER — NAPROXEN 500 MG PO TABS: 500.0000 mg | ORAL_TABLET | Freq: Two times a day (BID) | ORAL | 0 refills | Status: AC

## 2018-07-31 MED ORDER — CYCLOBENZAPRINE HCL 5 MG PO TABS: 5.0000 mg | ORAL_TABLET | Freq: Three times a day (TID) | ORAL | 0 refills | Status: DC | PRN

## 2018-07-31 NOTE — Patient Instructions (Signed)
Thank you for choosing [facility] for your health care needs.  You have been diagnosed with a lumbar (low back) muscle strain.  Recommend alternating ice and/or heat, 15-20 minutes at a time, several times a day. May use over the counter [place]engay, IcyHot, or BioFreeze. Perform stretching exercises; given AAOS handout.  You have been prescribed an anti-inflammatory, Naproxen. Take with food to prevent stomach upset. You have been prescribed a muscle relaxer, Flexeril. Take for muscle spasm/tightness. May cause sedation/tiredness.  Follow-up with family physician or orthopedist for further evaluation/management of back pain. Go directly to the ED if you develop worsening symptoms, numbness in groin, leg weakness, or other new/concerning symptom.  Low Back Strain  A strain is a stretch or tear in a muscle or the strong cords of tissue that attach muscle to bone (tendons). Strains of the lower back (lumbar spine) are a common cause of low back pain. A strain occurs when muscles or tendons are torn or are stretched beyond their limits. The muscles may become inflamed, resulting in pain and sudden muscle tightening (spasms). A strain can happen suddenly due to an injury (trauma), or it can develop gradually due to overuse. There are three types of strains:  Grade 1 is a mild strain involving a minor tear of the muscle fibers or tendons. This may cause some pain but no loss of muscle strength.  Grade 2 is a moderate strain involving a partial tear of the muscle fibers or tendons. This causes more severe pain and some loss of muscle strength.  Grade 3 is a severe strain involving a complete tear of the muscle or tendon. This causes severe pain and complete or nearly complete loss of muscle strength. What are the causes? This condition may be caused by:  Trauma, such as a fall or a hit to the body.  Twisting or overstretching the back. This may result from doing activities that require a lot of  energy, such as lifting heavy objects. What increases the risk? The following factors may increase your risk of getting this condition:  Playing contact sports.  Participating in sports or activities that put excessive stress on the back and require a lot of bending and twisting, including: ? Lifting weights or heavy objects. ? Gymnastics. ? Soccer. ? Figure skating. ? Snowboarding.  Being overweight or obese.  Having poor strength and flexibility. What are the signs or symptoms? Symptoms of this condition may include:  Sharp or dull pain in the lower back that does not go away. Pain may extend to the buttocks.  Stiffness.  Limited range of motion.  Inability to stand up straight due to stiffness or pain.  Muscle spasms. How is this diagnosed? This condition may be diagnosed based on:  Your symptoms.  Your medical history.  A physical exam. ? Your health care provider may push on certain areas of your back to determine the source of your pain. ? You may be asked to bend forward, backward, and side to side to assess the severity of your pain and your range of motion.  Imaging tests, such as: ? X-rays. ? MRI. How is this treated? Treatment for this condition may include:  Applying heat and cold to the affected area.  Medicines to help relieve pain and to relax your muscles (muscle relaxants).  NSAIDs to help reduce swelling and discomfort.  Physical therapy. When your symptoms improve, it is important to gradually return to your normal routine as soon as possible to reduce pain, avoid  stiffness, and avoid loss of muscle strength. Generally, symptoms should improve within 6 weeks of treatment. However, recovery time varies. Follow these instructions at home: Managing pain, stiffness, and swelling  If directed, apply ice to the injured area during the first 24 hours after your injury. ? Put ice in a plastic bag. ? Place a towel between your skin and the  bag. ? Leave the ice on for 20 minutes, 2-3 times a day.  If directed, apply heat to the affected area as often as told by your health care provider. Use the heat source that your health care provider recommends, such as a moist heat pack or a heating pad. ? Place a towel between your skin and the heat source. ? Leave the heat on for 20-30 minutes. ? Remove the heat if your skin turns bright red. This is especially important if you are unable to feel pain, heat, or cold. You may have a greater risk of getting burned. Activity  Rest and return to your normal activities as told by your health care provider. Ask your health care provider what activities are safe for you.  Avoid activities that take a lot of effort (are strenuous) for as long as told by your health care provider.  Do exercises as told by your health care provider. General instructions   Take over-the-counter and prescription medicines only as told by your health care provider.  If you have questions or concerns about safety while taking pain medicine, talk with your health care provider.  Do not drive or operate heavy machinery until you know how your pain medicine affects you.  Do not use any tobacco products, such as cigarettes, chewing tobacco, and e-cigarettes. Tobacco can delay bone healing. If you need help quitting, ask your health care provider.  Keep all follow-up visits as told by your health care provider. This is important. How is this prevented?               Warm up and stretch before being active.  Cool down and stretch after being active.  Give your body time to rest between periods of activity.  Avoid: ? Being physically inactive for long periods at a time. ? Exercising or playing sports when you are tired or in pain.  Use correct form when playing sports and lifting heavy objects.  Use good posture when sitting and standing.  Maintain a healthy weight.  Sleep on a mattress with  medium firmness to support your back.  Make sure to use equipment that fits you, including shoes that fit well.  Be safe and responsible while being active to avoid falls.  Do at least 150 minutes of moderate-intensity exercise each week, such as brisk walking or water aerobics. Try a form of exercise that takes stress off your back, such as swimming or stationary cycling.  Maintain physical fitness, including: ? Strength. ? Flexibility. ? Cardiovascular fitness. ? Endurance. Contact a health care provider if:  Your back pain does not improve after 6 weeks of treatment.  Your symptoms get worse. Get help right away if:  Your back pain is severe.  You are unable to stand or walk.  You develop pain in your legs.  You develop weakness in your buttocks or legs.  You have difficulty controlling when you urinate or when you have a bowel movement. This information is not intended to replace advice given to you by your health care provider. Make sure you discuss any questions you have with your  health care provider. Document Released: 07/26/2005 Document Revised: 09/20/2016 Document Reviewed: 05/07/2015 Elsevier Interactive Patient Education  2019 [employer]

## 2018-07-31 NOTE — Progress Notes (Signed)
Patient ID: Katelyn Whitehead DOB: [birth year] AGE: 27 y.o. MRN: [id number]   PCP: Gabriel CirriWicker, Cheryl, NP   Chief Complaint:  Chief Complaint  Patient presents with  . Choice-LBP(left side)    x1d     Subjective:    HPI:  Katelyn Whitehead is a 27 y.o. female presents for evaluation  Chief Complaint  Patient presents with  . Choice-LBP(left side)    x271d   27 year old female presents with one day history of left sided low back pain. Patient states she was lifting boxes full of Christmas decorations yesterday. When lifting one particular box, remembers twisting (trunk rotation), and felt pull/tightness in left low back. Very mild. This morning woke up, had worsening of pain in same location. Describes as grabbing/muscle spasm sensation. Worse with trunk extension (sitting upright) and movement (such as when ambulating). Associated tingling/numbness sensation in left buttocks and upper portion of lateral thigh. Present when lying flat, resolves when sitting in slightly hunched position. Has taken OTC Tylenol and applied heating pad with no improvement. Denies saddle anesthesia, bowel/bladder incontinence, urinary retention, radiating lower extremity pain, lower extremity weakness/paresthesias.  Patient seen by PCP for back pain. [facility]. Reported long standing low back pain, worse in past few weeks. Lumbar spine x-ray performed; no abnormality suggestive of cause of LBP (S1 nonfusion, suspected noncontributory).  Labs performed; ANA, sed rate, B12 level, CBC, CMP, TSH, lipid panel and VitD level - all WNL other than slightly low VitD level. Patient was advised to start yoga for treatment of LBP. Patient states she has not been seen by an orthopedist, has not had an MRI performed, and has never done PT.  Patient also, with second complaint, of 3-4 days of bilateral ear pressure. Reports popping/crackling sensation. Associated very mild  rhinorrhea and nasal congestion. Has taken over the counter Claritin with minimal relief. Denies fever, chills, headache, sinus pain/pressure, ear pain, ear discharge/drainage, tinnitus, sore throat, cough.  Patient currently breast feeding 2423 month old child.  A limited review of symptoms was performed, pertinent positives and negatives as mentioned in HPI.  The following portions of the patient's history were reviewed and updated as appropriate: allergies, current medications and past medical history.  Patient Active Problem List   Diagnosis Date Noted  . Fatigue 09/21/2017  . Weight gain 09/21/2017  . Numbness of toes 09/21/2017  . Bilateral low back pain without sciatica 09/21/2017  . Arthralgia 09/21/2017  . Vitamin D deficiency 05/13/2016  . B12 deficiency 05/13/2016  . Mild mitral and aortic regurgitation   . Panic disorder   . Depression, major, recurrent, moderate (HCC) 12/04/2014    Allergies  Allergen Reactions  . Influenza Vaccines Hives  . Sulfa Antibiotics     Hives Rash    Current Outpatient Medications on File Prior to Visit  Medication Sig Dispense Refill  . lisdexamfetamine (VYVANSE) 20 MG capsule Take by mouth.    . Loratadine 10 MG CAPS Take 1 capsule (10 mg total) by mouth at bedtime. 30 each 1  . LORazepam (ATIVAN) 0.5 MG tablet Take by mouth.    . sertraline (ZOLOFT) 25 MG tablet   1   No current facility-administered medications on file prior to visit.        Objective:   Vitals:   07/31/18 1155  BP: 110/80  Pulse: 86  Resp: 16  Temp: (!) 97.5 F (36.4 C)  SpO2: 98%     Wt  Readings from Last 3 Encounters:  07/31/18 157 lb (71.2 kg)  03/16/18 150 lb (68 kg)  01/27/18 151 lb (68.5 kg)    Physical Exam:   General Appearance:  Patient sitting comfortably on examination table. Conversational. Peri Jefferson self-historian. In no acute distress. Afebrile.   Head:  Normocephalic, without obvious abnormality, atraumatic  Eyes:  PERRL,  conjunctiva/corneas clear, EOM's intact  Ears:  Bilateral ear canals WNL. No erythema or edema. No discharge/drainage. Bilateral TMs WNL. Dull TMs. Possible minimal effusion bilaterally. No erythema. No injection. No scar tissue.  Nose: Nares normal, septum midline. Nasal mucosa reveals no edema. Scant clear rhinorrhea. No nasally sounding voice. No sinus tenderness with percussion/palpation.  Throat: Lips, mucosa, and tongue normal; teeth and gums normal. Throat reveals no erythema.   Neck: Supple, symmetrical, trachea midline, no lymphadenopathy  Lungs:   Clear to auscultation bilaterally, respirations unlabored  Heart:  Regular rate and rhythm, S1 and S2 normal, no murmur, rub, or gallop  Extremities: Extremities normal, atraumatic, no cyanosis or edema  Pulses: 2+ and symmetric  Skin: Skin color, texture, turgor normal, no rashes or lesions  Lymph nodes: Cervical, supraclavicular, and axillary nodes normal  Neurologic: Spine normal to inspection. No scoliosis. No gross deformity. No midline tenderness with palpation along spinous processes. Mild palpable muscle tightness/spasm on lumbar paraspinal musculature bilaterally. Mild tenderness with palpation over left paraspinal musculature and left SI joint. Limited ROM of lumbar spine; pain with flexion and extension (most comfortable position sitting on exam table in slightly hunched position). Minimal pain with rotation and lateral flexion. 5/5 lower extremity muscle strength; mild pain with left hip flexion against resitance. Sensation intact. Positive left straight leg raise. No gait abnormality. No foot drop.    Assessment & Plan:    Exam findings, diagnosis etiology and medication use and indications reviewed with patient. Follow-Up and discharge instructions provided. No emergent/urgent issues found on exam.  Patient education was provided.   Patient verbalized understanding of information provided and agrees with plan of care (POC), all  questions answered. The patient is advised to call or return to clinic if condition does not see an improvement in symptoms, or to seek the care of the closest emergency department if condition worsens with the below plan.    1. Lumbar strain, initial encounter  - cyclobenzaprine (FLEXERIL) 5 MG tablet; Take 1 tablet (5 mg total) by mouth 3 (three) times daily as needed for muscle spasms.  Dispense: 21 tablet; Refill: 0 - naproxen (NAPROSYN) 500 MG tablet; Take 1 tablet (500 mg total) by mouth 2 (two) times daily with a meal for 7 days.  Dispense: 14 tablet; Refill: 0  2. Muscle spasm of back  3. Radiculopathy, lumbar region  4. Dysfunction of both eustachian tubes  - loratadine-pseudoephedrine (CLARITIN-D 12 HOUR) 5-120 MG tablet; Take 1 tablet by mouth 2 (two) times daily.  Dispense: 14 tablet; Refill: 0 - fluticasone (FLONASE) 50 MCG/ACT nasal spray; Place 2 sprays into both nostrils daily.  Dispense: 16 g; Refill: 0  Patient with one day history of left sided paraspinal lumbar pain. HPI and physical examination consistent with lumbar strain. No red flags; saddle anesthesia, bowel/bladder incontinence, lower extremity weakness. Prescribed Naproxen and Flexeril. Advised stretching exercises, heat/ice, and OTC Bengay. Patient given work for week off from work. Advised re-evaluation by PCP or orthopedist in one week if symptoms not improving, sooner with any worsening symptoms.  Patient also with complaint of bilateral pressure/popping with nasal congestion. HPI and physical examination consistent  with eustachian tube dysfunction. TMs with no indication of AOM on exam and no associated ear pain. Prescribed anti-histamine with decongestant and Flonase nasal spray. Advised re-evaluation at [facility], by PCP, or by ENT in one week if not improving; sooner with worsening symptoms.  Patient currently breastfeeding. Discussed risks with prescribed medication. Discussed option of pumping and dumping  while on prescription medication. Patient has also been intending to discontinue breastfeeding at around this time; states she may do that.   Janalyn HarderSamantha Alfred Eckley, MHS, PA-C Rulon SeraSamantha F. Zacharey Jensen, MHS, PA-C Advanced Practice Provider [facility]  [facility]  [address], [facility], [address]st [address] [place], [place] [id number] (p):  [phone] [email or web address] [email or web address]

## 2018-09-01 DIAGNOSIS — F422 Mixed obsessional thoughts and acts: Secondary | ICD-10-CM | POA: Insufficient documentation

## 2018-09-28 ENCOUNTER — Ambulatory Visit: Payer: Self-pay | Admitting: Obstetrics and Gynecology

## 2018-09-30 NOTE — Progress Notes (Signed)
PCP:  Gabriel CirriWicker, Cheryl, NP   Chief Complaint  Patient presents with  . Gynecologic Exam     HPI:      Katelyn Whitehead is a 28 y.o. G2P1011 who LMP was Patient's last menstrual period was 09/21/2018 (exact date)., presents today for her NP annual examination.  Her menses are regular every 28-30 days, lasting 5-7 days.  Dysmenorrhea mild, occurring first 1-2 days of flow. Katelyn Whitehead does not have intermenstrual bleeding.  Sex activity: single partner, contraception - condoms, may want different BC. Had IUD placed PP that ended up being in abd cavity and had to be removed surgically at [facility]. Pt may want another one. Did nuvaring in past too but prefers minimal hormones. No hx of HTN, DVTs, migraines.  Last Pap: a few yrs ago Hx of STDs: none  There is no FH of breast cancer. There is no FH of ovarian cancer. The patient does do self-breast exams. Pt is breastfeeding.  Tobacco use: The patient denies current or previous tobacco use. Alcohol use: none No drug use.  Exercise: not active  Katelyn Whitehead does get adequate calcium and Vitamin D in her diet.  Pt with urinary urgency, frequency with little flow. Occas dysuria. Sx for awhile. Hx of UTIs in past but often pt doesn't know Katelyn Whitehead has one until urine tested. Drinks caffeine, little water.  Past Medical History:  Diagnosis Date  . ADHD (attention deficit hyperactivity disorder)   . Anxiety   . Asthma    childhood  . Depression   . Mild mitral and aortic regurgitation    a. 04/2013 Echo: EF 50-55%, mild AI, mild MR, no MVP.  Marland Kitchen. Palpitations    a. 2012 Holter: sinus tach, pvc's.  . Panic disorder     Past Surgical History:  Procedure Laterality Date  . IUD REMOVAL N/A 11/22/2016   Procedure: LAPAROCOPIC INTRAUTERINE DEVICE (IUD) REMOVAL;  Surgeon: Linzie Collinavid James Evans, MD;  Location: [facility] ORS;  Service: Gynecology;  Laterality: N/A;  . WISDOM TOOTH EXTRACTION      Family History  Problem Relation Age of Onset  . Depression Mother     . Diabetes Mother        type 2  . Hyperlipidemia Mother   . Hypertension Mother   . Melanoma Mother        skin  . Drug abuse Father   . Gout Father   . Depression Father   . Psychiatric Illness Father        PTSD  . ADD / ADHD Brother   . Depression Maternal Aunt   . Dementia Maternal Grandmother   . Depression Maternal Grandmother   . Heart disease Maternal Grandfather   . Diabetes Paternal Grandmother        type 2  . Hypertension Paternal Grandmother     Social History   Socioeconomic History  . Marital status: Married    Spouse name: Not on file  . Number of children: Not on file  . Years of education: Not on file  . Highest education level: Not on file  Occupational History  . Not on file  Social Needs  . Financial resource strain: Not on file  . Food insecurity:    Worry: Not on file    Inability: Not on file  . Transportation needs:    Medical: Not on file    Non-medical: Not on file  Tobacco Use  . Smoking status: Never Smoker  . Smokeless tobacco: Never Used  Substance and Sexual Activity  . Alcohol use: No    Alcohol/week: 0.0 standard drinks  . Drug use: No  . Sexual activity: Yes    Birth control/protection: Condom  Lifestyle  . Physical activity:    Days per week: Not on file    Minutes per session: Not on file  . Stress: Not on file  Relationships  . Social connections:    Talks on phone: Not on file    Gets together: Not on file    Attends religious service: Not on file    Active member of club or organization: Not on file    Attends meetings of clubs or organizations: Not on file    Relationship status: Not on file  . Intimate partner violence:    Fear of current or ex partner: Not on file    Emotionally abused: Not on file    Physically abused: Not on file    Forced sexual activity: Not on file  Other Topics Concern  . Not on file  Social History Narrative  . Not on file    Outpatient Medications Prior to Visit  Medication  Sig Dispense Refill  . LORazepam (ATIVAN) 0.5 MG tablet Take by mouth.    . sertraline (ZOLOFT) 50 MG tablet     . triamcinolone ointment (KENALOG) 0.1 %     . lisdexamfetamine (VYVANSE) 20 MG capsule Take by mouth.    . cyclobenzaprine (FLEXERIL) 5 MG tablet Take 1 tablet (5 mg total) by mouth 3 (three) times daily as needed for muscle spasms. 21 tablet 0  . fluticasone (FLONASE) 50 MCG/ACT nasal spray Place 2 sprays into both nostrils daily. 16 g 0  . Loratadine 10 MG CAPS Take 1 capsule (10 mg total) by mouth at bedtime. 30 each 1  . loratadine-pseudoephedrine (CLARITIN-D 12 HOUR) 5-120 MG tablet Take 1 tablet by mouth 2 (two) times daily. 14 tablet 0  . sertraline (ZOLOFT) 25 MG tablet   1   No facility-administered medications prior to visit.      ROS:  Review of Systems  Constitutional: Negative for fatigue, fever and unexpected weight change.  Respiratory: Negative for cough, shortness of breath and wheezing.   Cardiovascular: Negative for chest pain, palpitations and leg swelling.  Gastrointestinal: Positive for constipation. Negative for blood in stool, diarrhea, nausea and vomiting.  Endocrine: Negative for cold intolerance, heat intolerance and polyuria.  Genitourinary: Positive for dysuria, frequency and urgency. Negative for dyspareunia, flank pain, genital sores, hematuria, menstrual problem, pelvic pain, vaginal bleeding, vaginal discharge and vaginal pain.  Musculoskeletal: Negative for back pain, joint swelling and myalgias.  Skin: Negative for rash.  Neurological: Negative for dizziness, syncope, light-headedness, numbness and headaches.  Hematological: Negative for adenopathy.  Psychiatric/Behavioral: Positive for agitation. Negative for confusion, sleep disturbance and suicidal ideas. The patient is not nervous/anxious.    BREAST: No symptoms   Objective: BP 100/70   Pulse 77   Ht  (1.549 m)   Wt 156 lb (70.8 kg)   LMP 09/21/2018 (Exact Date)    Breastfeeding Yes   BMI 29.48 kg/m    Physical Exam Constitutional:      Appearance: Katelyn Whitehead is well-developed.  Genitourinary:     Vulva, vagina, cervix, uterus, right adnexa and left adnexa normal.     No vulval lesion or tenderness noted.     No vaginal discharge, erythema or tenderness.     No cervical polyp.     Uterus is not enlarged or tender.  No right or left adnexal mass present.     Right adnexa not tender.     Left adnexa not tender.  Neck:     Musculoskeletal: Normal range of motion.     Thyroid: No thyromegaly.  Cardiovascular:     Rate and Rhythm: Normal rate and regular rhythm.     Heart sounds: Normal heart sounds. No murmur.  Pulmonary:     Effort: Pulmonary effort is normal.     Breath sounds: Normal breath sounds.  Chest:     Breasts:        Right: No mass, nipple discharge, skin change or tenderness.        Left: No mass, nipple discharge, skin change or tenderness.  Abdominal:     Palpations: Abdomen is soft.     Tenderness: There is no abdominal tenderness. There is no guarding.  Musculoskeletal: Normal range of motion.  Neurological:     General: No focal deficit present.     Mental Status: Katelyn Whitehead is alert and oriented to person, place, and time.     Cranial Nerves: No cranial nerve deficit.  Skin:    General: Skin is warm and dry.     Coloration: Skin is not jaundiced.  Psychiatric:        Mood and Affect: Mood normal.        Behavior: Behavior normal.        Thought Content: Thought content normal.        Judgment: Judgment normal.  Vitals signs reviewed.     Results: Results for orders placed or performed in visit on 10/02/18 (from the past 24 hour(s))  POCT Urinalysis Dipstick     Status: Abnormal   Collection Time: 10/02/18  3:26 PM  Result Value Ref Range   Color, UA yellow    Clarity, UA clear    Glucose, UA Negative Negative   Bilirubin, UA neg    Ketones, UA neg    Spec Grav, UA 1.010 1.010 - 1.025   Blood, UA mod    pH, UA  6.5 5.0 - 8.0   Protein, UA Negative Negative   Urobilinogen, UA     Nitrite, UA pos    Leukocytes, UA Small (1+) (A) Negative   Appearance     Odor      Assessment/Plan: Encounter for annual routine gynecological examination  Cervical cancer screening - Plan: Cytology - PAP  Encounter for other general counseling or advice on contraception - IUD and nuvaring discussed. Pt to consider and f/u prn/with menses for IUD insertion. pt wants u/s after for placement due to malposition in past.  Acute cystitis with hematuria - Pos UA. Check C&S. Rx macrobid. RTO after C&S and tx for urine recheck since has frequent sx. Increase water. - Plan: POCT Urinalysis Dipstick, Urine Culture, nitrofurantoin, macrocrystal-monohydrate, (MACROBID) 100 MG capsule  Meds ordered this encounter  Medications  . nitrofurantoin, macrocrystal-monohydrate, (MACROBID) 100 MG capsule    Sig: Take 1 capsule (100 mg total) by mouth 2 (two) times daily for 7 days.    Dispense:  14 capsule    Refill:  0    Order Specific Question:   Supervising Provider    Answer:   Nadara Mustard [[id number]]             GYN counsel family planning choices, adequate intake of calcium and vitamin D, diet and exercise     F/U  Return in about 1 year (around 10/03/2019).   B. ,  PA-C 10/03/2018 9:47 AM

## 2018-10-02 ENCOUNTER — Ambulatory Visit (INDEPENDENT_AMBULATORY_CARE_PROVIDER_SITE_OTHER): Payer: No Typology Code available for payment source | Admitting: Obstetrics and Gynecology

## 2018-10-02 ENCOUNTER — Other Ambulatory Visit (HOSPITAL_COMMUNITY)
Admission: RE | Admit: 2018-10-02 | Discharge: 2018-10-02 | Disposition: A | Discharge status: Home / Self Care | Payer: No Typology Code available for payment source | Source: Ambulatory Visit | Attending: Obstetrics and Gynecology | Admitting: Obstetrics and Gynecology

## 2018-10-02 ENCOUNTER — Encounter: Payer: Self-pay | Admitting: Obstetrics and Gynecology

## 2018-10-02 VITALS — BP 100/70 | HR 77 | Ht 61.0 in | Wt 156.0 lb

## 2018-10-02 DIAGNOSIS — Z124 Encounter for screening for malignant neoplasm of cervix: Secondary | ICD-10-CM | POA: Insufficient documentation

## 2018-10-02 DIAGNOSIS — N3001 Acute cystitis with hematuria: Secondary | ICD-10-CM

## 2018-10-02 DIAGNOSIS — Z01419 Encounter for gynecological examination (general) (routine) without abnormal findings: Secondary | ICD-10-CM | POA: Diagnosis not present

## 2018-10-02 DIAGNOSIS — Z3009 Encounter for other general counseling and advice on contraception: Secondary | ICD-10-CM

## 2018-10-02 LAB — POCT URINALYSIS DIPSTICK
Bilirubin, UA: NEGATIVE
Glucose, UA: NEGATIVE
Ketones, UA: NEGATIVE
NITRITE UA: POSITIVE
PH UA: 6.5 (ref 5.0–8.0)
Protein, UA: NEGATIVE
SPEC GRAV UA: 1.01 (ref 1.010–1.025)

## 2018-10-02 MED ORDER — NITROFURANTOIN MONOHYD MACRO 100 MG PO CAPS: 100.0000 mg | ORAL_CAPSULE | Freq: Two times a day (BID) | ORAL | 0 refills | Status: AC

## 2018-10-02 NOTE — Patient Instructions (Signed)
I value your feedback and entrusting us with your care. If you get a  patient survey, I would appreciate you taking the time to let us know about your experience today. Thank you! 

## 2018-10-03 LAB — CYTOLOGY - PAP: Diagnosis: NEGATIVE

## 2018-10-04 LAB — URINE CULTURE

## 2018-10-04 NOTE — Progress Notes (Signed)
Pt aware. She says she will wait until IUD insertion visit to check UA.

## 2018-10-04 NOTE — Progress Notes (Signed)
Pls let pt know C&S showed UTI, she is on correct abx. F/u 1 wk after completing abx for UA with nurse. Or, if she is RTO for IUD insertion and is about 1 wk after abx tx, can do all at same time. Thx.

## 2018-12-21 ENCOUNTER — Other Ambulatory Visit: Payer: Self-pay

## 2018-12-21 ENCOUNTER — Ambulatory Visit (INDEPENDENT_AMBULATORY_CARE_PROVIDER_SITE_OTHER): Payer: No Typology Code available for payment source | Admitting: Obstetrics and Gynecology

## 2018-12-21 ENCOUNTER — Encounter: Payer: Self-pay | Admitting: Obstetrics and Gynecology

## 2018-12-21 VITALS — BP 100/72 | Ht 62.0 in | Wt 157.0 lb

## 2018-12-21 DIAGNOSIS — Z30011 Encounter for initial prescription of contraceptive pills: Secondary | ICD-10-CM

## 2018-12-21 DIAGNOSIS — N3001 Acute cystitis with hematuria: Secondary | ICD-10-CM | POA: Diagnosis not present

## 2018-12-21 LAB — POCT URINALYSIS DIPSTICK
Bilirubin, UA: NEGATIVE
Glucose, UA: NEGATIVE
Ketones, UA: NEGATIVE
Nitrite, UA: NEGATIVE
Protein, UA: NEGATIVE
Spec Grav, UA: 1.01 (ref 1.010–1.025)
pH, UA: 7.5 (ref 5.0–8.0)

## 2018-12-21 MED ORDER — CIPROFLOXACIN HCL 500 MG PO TABS: 500.0000 mg | ORAL_TABLET | Freq: Two times a day (BID) | ORAL | 0 refills | Status: AC

## 2018-12-21 MED ORDER — DROSPIRENONE 4 MG PO TABS: 1.0000 | ORAL_TABLET | Freq: Every day | ORAL | 2 refills | Status: DC

## 2018-12-21 NOTE — Progress Notes (Signed)
Gabriel Cirri, NP   Chief Complaint  Patient presents with  . Contraception    HPI:      [name] is a 28 y.o. G2P1011 who LMP was Patient's last menstrual period was 12/12/2018 (exact date)., presents today for [facility] consultation. Discussed different methods at 2/20 annual but pt is unsure what she wants. She would like minimal hormones but had bad experience with IUD.   She was also diagnosed with UTI 2/20. Treated with macrobid with sx relief but pt wants urine rechecked. Hx of recurrent UTIs in past. Not having any symptoms of UTI currently but that is usuallly the case in the past.    Past Medical History:  Diagnosis Date  . ADHD (attention deficit hyperactivity disorder)   . Anxiety   . Asthma    childhood  . Depression   . Mild mitral and aortic regurgitation    a. 04/2013 Echo: EF 50-55%, mild AI, mild MR, no MVP.  Marland Kitchen Palpitations    a. 2012 Holter: sinus tach, pvc's.  . Panic disorder     Past Surgical History:  Procedure Laterality Date  . IUD REMOVAL N/A 11/22/2016   Procedure: LAPAROCOPIC INTRAUTERINE DEVICE (IUD) REMOVAL;  Surgeon: Linzie Collin, MD;  Location: [facility] ORS;  Service: Gynecology;  Laterality: N/A;  . WISDOM TOOTH EXTRACTION      Family History  Problem Relation Age of Onset  . Depression Mother   . Diabetes Mother        type 2  . Hyperlipidemia Mother   . Hypertension Mother   . Melanoma Mother        skin  . Drug abuse Father   . Gout Father   . Depression Father   . Psychiatric Illness Father        PTSD  . ADD / ADHD Brother   . Depression Maternal Aunt   . Dementia Maternal Grandmother   . Depression Maternal Grandmother   . Heart disease Maternal Grandfather   . Diabetes Paternal Grandmother        type 2  . Hypertension Paternal Grandmother     Social History   Socioeconomic History  . Marital status: Married    Spouse name: Not on file  . Number of children: Not on file  . Years of education: Not on  file  . Highest education level: Not on file  Occupational History  . Not on file  Social Needs  . Financial resource strain: Not on file  . Food insecurity:    Worry: Not on file    Inability: Not on file  . Transportation needs:    Medical: Not on file    Non-medical: Not on file  Tobacco Use  . Smoking status: Never Smoker  . Smokeless tobacco: Never Used  Substance and Sexual Activity  . Alcohol use: No    Alcohol/week: 0.0 standard drinks  . Drug use: No  . Sexual activity: Yes    Birth control/protection: Condom  Lifestyle  . Physical activity:    Days per week: Not on file    Minutes per session: Not on file  . Stress: Not on file  Relationships  . Social connections:    Talks on phone: Not on file    Gets together: Not on file    Attends religious service: Not on file    Active member of club or organization: Not on file    Attends meetings of clubs or organizations: Not on  file    Relationship status: Not on file  . Intimate partner violence:    Fear of current or ex partner: Not on file    Emotionally abused: Not on file    Physically abused: Not on file    Forced sexual activity: Not on file  Other Topics Concern  . Not on file  Social History Narrative  . Not on file    Outpatient Medications Prior to Visit  Medication Sig Dispense Refill  . LORazepam (ATIVAN) 0.5 MG tablet Take by mouth.    . sertraline (ZOLOFT) 50 MG tablet     . triamcinolone ointment (KENALOG) 0.1 %     . lisdexamfetamine (VYVANSE) 20 MG capsule Take by mouth.     No facility-administered medications prior to visit.       ROS:  Review of Systems  Constitutional: Negative for fever.  Gastrointestinal: Negative for blood in stool, constipation, diarrhea, nausea and vomiting.  Genitourinary: Negative for dyspareunia, dysuria, flank pain, frequency, hematuria, urgency, vaginal bleeding, vaginal discharge and vaginal pain.  Musculoskeletal: Negative for back pain.  Skin:  Negative for rash.    OBJECTIVE:   Vitals:  BP 100/72   Ht 5\' 2"  (1.575 m)   Wt 157 lb (71.2 kg)   LMP 12/12/2018 (Exact Date)   Breastfeeding Yes   BMI 28.72 kg/m   Physical Exam Vitals signs reviewed.  Constitutional:      Appearance: She is well-developed.  Neck:     Musculoskeletal: Normal range of motion.  Pulmonary:     Effort: Pulmonary effort is normal.  Musculoskeletal: Normal range of motion.  Skin:    General: Skin is warm and dry.  Neurological:     General: No focal deficit present.     Mental Status: She is alert and oriented to person, place, and time.     Cranial Nerves: No cranial nerve deficit.  Psychiatric:        Mood and Affect: Mood normal.        Behavior: Behavior normal.        Thought Content: Thought content normal.        Judgment: Judgment normal.     Results: Results for orders placed or performed in visit on 12/21/18 (from the past 24 hour(s))  POCT Urinalysis Dipstick     Status: Abnormal   Collection Time: 12/21/18  1:48 PM  Result Value Ref Range   Color, UA yellow    Clarity, UA clear    Glucose, UA Negative Negative   Bilirubin, UA neg    Ketones, UA neg    Spec Grav, UA 1.010 1.010 - 1.025   Blood, UA mod    pH, UA 7.5 5.0 - 8.0   Protein, UA Negative Negative   Urobilinogen, UA     Nitrite, UA neg    Leukocytes, UA Moderate (2+) (A) Negative   Appearance     Odor       Assessment/Plan: Acute cystitis with hematuria - Pos UA. Hx of UTIs. Check C&S. Rx cipro for 7 days. Will f/u with results. May need urology ref if sx recur. Increase water/add cranberry tabs. - Plan: POCT Urinalysis Dipstick, Urine Culture, ciprofloxacin (CIPRO) 500 MG tablet  Encounter for initial prescription of contraceptive pills - BC options discussed. Pt wants to try POP. 2 samples/coupon card, Rx slynd. Start today. Condoms for 1 mo. F/u prn.  - Plan: Drospirenone (SLYND) 4 MG TABS    Meds ordered this encounter  Medications  .  ciprofloxacin (CIPRO) 500 MG tablet    Sig: Take 1 tablet (500 mg total) by mouth 2 (two) times daily for 7 days.    Dispense:  14 tablet    Refill:  0  . Drospirenone (SLYND) 4 MG TABS    Sig: Take 1 tablet by mouth daily.    Dispense:  84 tablet    Refill:  2      Return if symptoms worsen or fail to improve.  Denys Salinger B. Sadie Pickar, PA-C 12/21/2018 3:36 PM

## 2018-12-21 NOTE — Patient Instructions (Signed)
I value your feedback and entrusting us with your care. If you get a [facility] patient survey, I would appreciate you taking the time to let us know about your experience today. Thank you! 

## 2018-12-23 LAB — URINE CULTURE

## 2018-12-26 ENCOUNTER — Other Ambulatory Visit: Payer: Self-pay | Admitting: Obstetrics and Gynecology

## 2018-12-26 DIAGNOSIS — Z30011 Encounter for initial prescription of contraceptive pills: Secondary | ICD-10-CM

## 2018-12-26 MED ORDER — DROSPIRENONE 4 MG PO TABS: 1.0000 | ORAL_TABLET | Freq: Every day | ORAL | 2 refills | Status: DC

## 2018-12-26 NOTE — Progress Notes (Signed)
POP sent to [facility] so she can use coupon card. Needed PA for [facility] Emp pharm. Pt to f/u prn.

## 2019-03-12 ENCOUNTER — Ambulatory Visit (INDEPENDENT_AMBULATORY_CARE_PROVIDER_SITE_OTHER): Payer: Self-pay | Admitting: Physician Assistant

## 2019-03-12 ENCOUNTER — Other Ambulatory Visit: Payer: Self-pay

## 2019-03-12 VITALS — BP 105/65 | HR 107 | Temp 98.1°F | Resp 16 | Wt 159.0 lb

## 2019-03-12 DIAGNOSIS — H6983 Other specified disorders of Eustachian tube, bilateral: Secondary | ICD-10-CM

## 2019-03-12 DIAGNOSIS — T63481A Toxic effect of venom of other arthropod, accidental (unintentional), initial encounter: Secondary | ICD-10-CM

## 2019-03-12 MED ORDER — CETIRIZINE HCL 10 MG PO TABS: 10.0000 mg | ORAL_TABLET | Freq: Every day | ORAL | 0 refills | Status: DC

## 2019-03-12 MED ORDER — AZELASTINE-FLUTICASONE 137-50 MCG/ACT NA SUSP: 1.0000 | Freq: Two times a day (BID) | NASAL | 0 refills | Status: DC

## 2019-03-12 MED ORDER — PREDNISONE 50 MG PO TABS: 50.0000 mg | ORAL_TABLET | Freq: Every day | ORAL | 0 refills | Status: AC

## 2019-03-12 NOTE — Progress Notes (Signed)
Patient ID: PEDRO OLDENBURG DOB: 04-Mar-1991 AGE: 28 y.o. MRN: [id number]   PCP: Kathrine Haddock, NP   Chief Complaint:  Chief Complaint  Patient presents with  . Insect Bite    2 days, wasp, right anckle, swollen  . Ear Fullness    2 weeks      Subjective:    HPI:  KALKIDAN CAUDELL is a 28 y.o. female presents for evaluation  Chief Complaint  Patient presents with  . Insect Bite    2 days, wasp, right anckle, swollen  . Ear Fullness    2 weeks     28 year old female presents to [facility] with two complaints.  First complaint. Patient reports two days ago, Saturday 8/1, was outside and witnessed insect sting to medial aspect of right ankle. Burning/stinging pain for 1 hour. Gradually lessened. Has developed associated erythema and edema. Worse today than yesterday. Has taken OTC Benadryl oral and applied Benadryl cream with minimal relief. Has taken OTC Tylenol with no symptom improvement. Denies fever, chills, headache, streaking redness, bleeding, purulent drainage, foot weakness/paresthesias, gait abnormality. Denies swelling of tongue/lips/throat, sensation of scratchy throat, sensation of swollen throat, wheezing, chest tightness, SOB. No anaphylactic history. Previous history of yellow jacket sting; did not cause similar swelling/redness.  Second complaint. Patient reports several year history of bilateral ear fullness sensation. Clogged feeling. Muffled hearing. Feels like water in her ear. When lying on side, feels water move in her ear. Associated popping/crackling. Associated intermittent tinnitus. Worse over past 1-2 weeks. One ear not worse than the other. Will occasionally have dizziness/imbalance sensation with turning head quickly. Associated seasonal allergies; worse in the summer. Reports rhinorrhea and postnasal drip. Has not been using any OTC antihistamine or other medication for symptom relief. Denies fever, chills, headache, ear discharge/drainage, ear  pain, sinus pain, nasal congestion, sore throat, cough.  A limited review of symptoms was performed, pertinent positives and negatives as mentioned in HPI.  The following portions of the patient's history were reviewed and updated as appropriate: allergies, current medications and past medical history.  Patient Active Problem List   Diagnosis Date Noted  . Mixed obsessional thoughts and acts 09/01/2018  . Fatigue 09/21/2017  . Weight gain 09/21/2017  . Numbness of toes 09/21/2017  . Bilateral low back pain without sciatica 09/21/2017  . Arthralgia 09/21/2017  . Vitamin D deficiency 05/13/2016  . B12 deficiency 05/13/2016  . Mild mitral and aortic regurgitation   . Panic disorder   . Depression, major, recurrent, moderate ([place]) 12/04/2014    Allergies  Allergen Reactions  . Influenza Vaccines Hives  . Sulfa Antibiotics     Hives Rash    Current Outpatient Medications on File Prior to Visit  Medication Sig Dispense Refill  . acetaminophen (TYLENOL) 500 MG tablet Take 500 mg by mouth every 6 (six) hours as needed.    . diphenhydrAMINE HCl (BENADRYL ALLERGY PO) Take by mouth.    . Drospirenone (SLYND) 4 MG TABS Take 1 tablet by mouth daily. 84 tablet 2  . LORazepam (ATIVAN) 0.5 MG tablet Take by mouth.    . sertraline (ZOLOFT) 50 MG tablet     . triamcinolone ointment (KENALOG) 0.1 %      No current facility-administered medications on file prior to visit.        Objective:   Vitals:   03/12/19 1615  BP: 105/65  Pulse: (!) 107  Resp: 16  Temp: 98.1 F (36.7 C)  SpO2: 97%  Wt Readings from Last 3 Encounters:  03/12/19 159 lb (72.1 kg)  12/21/18 157 lb (71.2 kg)  10/02/18 156 lb (70.8 kg)    Physical Exam:   General Appearance:  Patient sitting comfortably on examination table. Conversational. Peri JeffersonGood self-historian. In no acute distress. Afebrile.   Head:  Normocephalic, without obvious abnormality, atraumatic  Eyes:  PERRL, conjunctiva/corneas clear, EOM's  intact  Ears:  Left ear canal WNL. No cerumen. No erythema or edema. No open wound. No visible purulent drainage. No tenderness with palpation over left tragus or with manipulation of left auricle. No visible erythema or edema of left mastoid. No tenderness with palpation over left mastoid. Right ear canal WNL. No cerumen. No erythema or edema. No open wound. No visible purulent drainage. No tenderness with palpation over right tragus or with manipulation of right auricle. No visible erythema or edema of right mastoid. No tenderness with palpation over right mastoid. Left TM WNL. Good light reflex. Visible landmarks. No erythema. No injection. No bulging or retraction. No visible perforation. No serous effusion. No visible purulent effusion. No tympanostomy tube. No scar tissue. Right TM WNL. Good light reflex. Visible landmarks. No erythema. No injection. No bulging or retraction. No visible perforation. No serous effusion. No visible purulent effusion. No tympanostomy tube. No scar tissue.  Nose: Nares normal. Septum midline. No visible polyps. No discharge. Normal mucosa. No sinus tenderness with percussion/palpation.  Throat: Lips, mucosa, and tongue normal; teeth and gums normal. Throat reveals no erythema. No postnasal drip. No visible cobblestoning. Tonsils with no enlargement or exudate. Tonsils with crevices. No visible tonsilloliths. Uvula midline with no edema or erythema.  Neck: Supple, symmetrical, trachea midline, no adenopathy  Lungs:   Clear to auscultation bilaterally, respirations unlabored. Good aeration. No rales, rhonchi, crackles or wheezing.  Heart:  Regular rate and rhythm  Extremities: Right lower leg inspection reveals pinpoint scab at medial aspect, just anterior to medial malleolus. 3cm x 6cm ovular area of surrounding erythema. Associated edema, circumferential; extends from lower third of lower leg to dorsal aspect of foot. No ecchymosis. Nonpitting edema. No streaking  redness. Palpable warmth. Minimal tenderness with palpation. No calf tenderness. Limited ROM of ankle due to pain. 5/5 motor strength. Sensation intact. Strong pedal pulse. No gait abnormality.  Pulses: 2+ and symmetric  Skin: Skin color, texture, turgor normal, no rashes or lesions No visible urticaria/hives.  Lymph nodes: Cervical, supraclavicular, and axillary nodes normal  Neurologic: Normal    Assessment & Plan:    Exam findings, diagnosis etiology and medication use and indications reviewed with patient. Follow-Up and discharge instructions provided. No emergent/urgent issues found on exam.  Patient education was provided.   Patient verbalized understanding of information provided and agrees with plan of care (POC), all questions answered. The patient is advised to call or return to clinic if condition does not see an improvement in symptoms, or to seek the care of the closest emergency department if condition worsens with the below plan.    1. Insect stings, accidental or unintentional, initial encounter - predniSONE (DELTASONE) 50 MG tablet; Take 1 tablet (50 mg total) by mouth daily with breakfast for 5 days.  Dispense: 5 tablet; Refill: 0  2. Eustachian tube dysfunction, bilateral - cetirizine (ZYRTEC) 10 MG tablet; Take 1 tablet (10 mg total) by mouth daily.  Dispense: 30 tablet; Refill: 0 - Azelastine-Fluticasone 137-50 MCG/ACT SUSP; Place 1 spray into the nose 2 (two) times daily.  Dispense: 23 g; Refill: 20  28 year old  female presents with two complaints. Two days status post right ankle insect/wasp sting. Associated erythema/edema indicative of allergic reaction. No significant induration, purulent drainage, streaking redness or associated constitutional symptoms concerning for secondary bacterial cellulitis, at this time. Discussed with patient importance of close monitoring. VSS. Afebrile. In no acute distress. No hives/urticaria, oral symptoms, or chest  tightness/wheezing/SOB. No indication of anaphylactic reaction. Prescribed 5-day course of 50mg  prednisone blast. Advised ice, elevation, and provided ACE wrap. Gave work excuse note, so patient can elevate and ice leg tomorrow. Advised f/u with UC or ED immediately with fever, chills, nausea/vomiting, streaking redness, purulent drainage, worsening pain/swelling/redness, or other new/concerning symptom.  Patient with several year history of bilateral ear fullness/pressure/water-filled sensation. Associated seasonal allergies. VSS. Afebrile. Benign ear exam. Suspect eustachian tube dysfunction. Prednisone prescribed for insect sting may help. Prescribed Zyrtec and Dymista nasal spray. Patient, due to duration of symptoms, primarily wants to follow-up with ENT. Gave patient [facility] contact information. Advised patient call ENT and schedule appointment for further evaluation/treatment.  Patient agreed with plans.   Janalyn HarderSamantha Amarii Amy, MHS, PA-C Rulon SeraSamantha F. Smrithi Pigford, MHS, PA-C Advanced Practice Provider [facility]  [facility]  [phone] [place], [place] [address] (p): [phone] [email or web address]

## 2019-03-12 NOTE — Patient Instructions (Addendum)
Thank you for choosing [facility] for your health care needs.  You have been diagnosed with: 1. Insect stings, accidental or unintentional, initial encounter - predniSONE (DELTASONE) 50 MG tablet; Take 1 tablet (50 mg total) by mouth daily with breakfast for 5 days.  Dispense: 5 tablet; Refill: 0  2. Eustachian tube dysfunction, bilateral - cetirizine (ZYRTEC) 10 MG tablet; Take 1 tablet (10 mg total) by mouth daily.  Dispense: 30 tablet; Refill: 0 - Azelastine-Fluticasone 137-50 MCG/ACT SUSP; Place 1 spray into the nose 2 (two) times daily.  Dispense: 23 g; Refill: 0  Take medications as prescribed. Take prednisone with food to prevent stomach upset. Take prednisone in the morning, may cause insomnia.  Elevate leg. Apply ice. May use ACE wrap.  Follow-up with family physician, urgent care, or ED in 2-3 days if not improving. Go to urgent care or ED sooner if you develop fever, chills, sweats, nausea/vomiting, dizziness/lightheadedness, worsening pain, swelling, or redness, or other new/concerning symptom.  Use Zyrtec during the day. Use Benadryl at night.  Follow-up with ENT (ear, nose and throat physician) in one week if symptoms not improving.  Such as: [facility] Address: [address], [place], [place] [address] Phone: [phone] Appointments: [email or web address]  Hope you feel better soon!  Bee, Wasp, or [employer], Adult Bees, wasps, and hornets are part of a family of insects that can sting people. These stings can cause pain and inflammation, but they are usually not serious. However, some people may have an allergic reaction to a sting. This can cause the symptoms to be more severe. What increases the risk? You may be at a greater risk of getting stung if you:  Provoke a stinging insect by swatting or disturbing it.  Wear strong-smelling soaps, deodorants, or body sprays.  Spend time outdoors near gardens with flowers or fruit trees or in  clothes that expose skin.  Eat or drink outside. What are the signs or symptoms? Common symptoms of this condition include:  A red lump in the skin that sometimes has a tiny hole in the center. In some cases, a stinger may be in the center of the wound.  Pain and itching at the sting site.  Redness and swelling around the sting site. If you have an allergic reaction (localized allergic reaction), the swelling and redness may spread out from the sting site. In some cases, this reaction can continue to develop over the next 24-48 hours. In rare cases, a person may have a severe allergic reaction (anaphylactic reaction) to a sting. Symptoms of an anaphylactic reaction may include:  Wheezing or difficulty breathing.  Raised, itchy, red patches on the skin (hives).  Nausea or vomiting.  Abdominal cramping.  Diarrhea.  Tightness in the chest or chest pain.  Dizziness or fainting.  Redness of the face (flushing).  Hoarse voice.  Swollen tongue, lips, or face. How is this diagnosed? This condition is usually diagnosed based on your symptoms and medical history as well as a physical exam. You may have an allergy test to determine if you are allergic to the substance that the insect injected during the sting (venom). How is this treated? If you were stung by a bee, the stinger and a small sac of venom may be in the wound. It is important to remove the stinger as soon as possible. You can do this by brushing across the wound with gauze, a fingernail, or a flat card such as a credit card.  Removing the stinger can help reduce the severity of your body's reaction to the sting. Most stings can be treated with:  Icing to reduce swelling in the area.  Medicines (antihistamines) to treat itching or an allergic reaction.  Medicines to help reduce pain. These may be medicines that you take by mouth, or medicated creams or lotions that you apply to your skin. Pay close attention to your  symptoms after you have been stung. If possible, have someone stay with you to make sure you do not have an allergic reaction. If you have any signs of an allergic reaction, call your health care provider. If you have ever had a severe allergic reaction, your health care provider may give you an inhaler or injectable medicine (epinephrine auto-injector) to use if necessary. Follow these instructions at home:   Wash the sting site 2-3 times each day with soap and water as told by your health care provider.  Apply or take over-the-counter and prescription medicines only as told by your health care provider.  If directed, apply ice to the sting area. ? Put ice in a plastic bag. ? Place a towel between your skin and the bag. ? Leave the ice on for 20 minutes, 2-3 times a day.  Do not scratch the sting area.  If you had a severe allergic reaction to a sting, you may need: ? To wear a medical bracelet or necklace that lists the allergy. ? To learn when and how to use an anaphylaxis kit or epinephrine injection. Your family members and coworkers may also need to learn this. ? To carry an anaphylaxis kit or epinephrine injection with you at all times. How is this prevented?  Avoid swatting at stinging insects and disturbing insect nests.  Do not use fragrant soaps or lotions.  Wear shoes, pants, and long sleeves when spending time outdoors, especially in grassy areas where stinging insects are common.  Keep outdoor areas free from nests or hives.  Keep food and drink containers covered when eating outdoors.  Avoid working or sitting near [employer], if possible.  Wear gloves if you are gardening or working outdoors.  If an attack by a stinging insect or a swarm seems likely in the moment, move away from the area or find a barrier between you and the insect(s), such as a door. Contact a health care provider if:  Your symptoms do not get better in 2-3 days.  You have redness,  swelling, or pain that spreads beyond the area of the sting.  You have a fever. Get help right away if: You have symptoms of a severe allergic reaction. These include:  Wheezing or difficulty breathing.  Tightness in the chest or chest pain.  Light-headedness or fainting.  Itchy, raised, red patches on the skin.  Nausea or vomiting.  Abdominal cramping.  Diarrhea.  A swollen tongue or lips, or trouble swallowing.  Dizziness or fainting. Summary  Stings from bees, wasps, and hornets can cause pain and inflammation, but they are usually not serious. However, some people may have an allergic reaction to a sting. This can cause the symptoms to be more severe.  Pay close attention to your symptoms after you have been stung. If possible, have someone stay with you to make sure you do not have an allergic reaction.  Call your health care provider if you have any signs of an allergic reaction. This information is not intended to replace advice given to you by your health care provider.  Make sure you discuss any questions you have with your health care provider. Document Released: 07/26/2005 Document Revised: 07/21/2017 Document Reviewed: 09/30/2016   Eustachian Tube Dysfunction  Eustachian tube dysfunction refers to a condition in which a blockage develops in the narrow passage that connects the middle ear to the back of the nose (eustachian tube). The eustachian tube regulates air pressure in the middle ear by letting air move between the ear and nose. It also helps to drain fluid from the middle ear space. Eustachian tube dysfunction can affect one or both ears. When the eustachian tube does not function properly, air pressure, fluid, or both can build up in the middle ear. What are the causes? This condition occurs when the eustachian tube becomes blocked or cannot open normally. Common causes of this condition include:  Ear infections.  Colds and other infections that affect  the nose, mouth, and throat (upper respiratory tract).  Allergies.  Irritation from cigarette smoke.  Irritation from stomach acid coming up into the esophagus (gastroesophageal reflux). The esophagus is the tube that carries food from the mouth to the stomach.  Sudden changes in air pressure, such as from descending in an airplane or scuba diving.  Abnormal growths in the nose or throat, such as: ? Growths that line the nose (nasal polyps). ? Abnormal growth of cells (tumors). ? Enlarged tissue at the back of the throat (adenoids). What increases the risk? You are more likely to develop this condition if:  You smoke.  You are overweight.  You are a child who has: ? Certain birth defects of the mouth, such as cleft palate. ? Large tonsils or adenoids. What are the signs or symptoms? Common symptoms of this condition include:  A feeling of fullness in the ear.  Ear pain.  Clicking or popping noises in the ear.  Ringing in the ear.  Hearing loss.  Loss of balance.  Dizziness. Symptoms may get worse when the air pressure around you changes, such as when you travel to an area of high elevation, fly on an airplane, or go scuba diving. How is this diagnosed? This condition may be diagnosed based on:  Your symptoms.  A physical exam of your ears, nose, and throat.  Tests, such as those that measure: ? The movement of your eardrum (tympanogram). ? Your hearing (audiometry). How is this treated? Treatment depends on the cause and severity of your condition.  In mild cases, you may relieve your symptoms by moving air into your ears. This is called "popping the ears."  In more severe cases, or if you have symptoms of fluid in your ears, treatment may include: ? Medicines to relieve congestion (decongestants). ? Medicines that treat allergies (antihistamines). ? Nasal sprays or ear drops that contain medicines that reduce swelling (steroids). ? A procedure to drain the  fluid in your eardrum (myringotomy). In this procedure, a small tube is placed in the eardrum to:  Drain the fluid.  Restore the air in the middle ear space. ? A procedure to insert a balloon device through the nose to inflate the opening of the eustachian tube (balloon dilation). Follow these instructions at home: Lifestyle  Do not do any of the following until your health care provider approves: ? Travel to high altitudes. ? Fly in airplanes. ? Work in a Pension scheme manager or room. ? Scuba dive.  Do not use any products that contain nicotine or tobacco, such as cigarettes and e-cigarettes. If you need help quitting, ask your  health care provider.  Keep your ears dry. Wear fitted earplugs during showering and bathing. Dry your ears completely after. General instructions  Take over-the-counter and prescription medicines only as told by your health care provider.  Use techniques to help pop your ears as recommended by your health care provider. These may include: ? Chewing gum. ? Yawning. ? Frequent, forceful swallowing. ? Closing your mouth, holding your nose closed, and gently blowing as if you are trying to blow air out of your nose.  Keep all follow-up visits as told by your health care provider. This is important. Contact a health care provider if:  Your symptoms do not go away after treatment.  Your symptoms come back after treatment.  You are unable to pop your ears.  You have: ? A fever. ? Pain in your ear. ? Pain in your head or neck. ? Fluid draining from your ear.  Your hearing suddenly changes.  You become very dizzy.  You lose your balance. Summary  Eustachian tube dysfunction refers to a condition in which a blockage develops in the eustachian tube.  It can be caused by ear infections, allergies, inhaled irritants, or abnormal growths in the nose or throat.  Symptoms include ear pain, hearing loss, or ringing in the ears.  Mild cases are treated  with maneuvers to unblock the ears, such as yawning or ear popping.  Severe cases are treated with medicines. Surgery may also be done (rare). This information is not intended to replace advice given to you by your health care provider. Make sure you discuss any questions you have with your health care provider. Document Released: 08/22/2015 Document Revised: 11/15/2017 Document Reviewed: 11/15/2017 Elsevier Patient Education  2020 [place] Patient Education  [employer].

## 2019-03-19 ENCOUNTER — Encounter: Payer: Self-pay | Admitting: Obstetrics and Gynecology

## 2019-06-14 ENCOUNTER — Ambulatory Visit: Payer: Self-pay | Admitting: Unknown Physician Specialty

## 2019-08-30 ENCOUNTER — Telehealth (INDEPENDENT_AMBULATORY_CARE_PROVIDER_SITE_OTHER): Payer: No Typology Code available for payment source | Admitting: Nurse Practitioner

## 2019-08-30 ENCOUNTER — Encounter: Payer: Self-pay | Admitting: Nurse Practitioner

## 2019-08-30 ENCOUNTER — Other Ambulatory Visit: Payer: Self-pay

## 2019-08-30 VITALS — Temp 99.6°F | Ht 62.0 in | Wt 160.0 lb

## 2019-08-30 DIAGNOSIS — Z20822 Contact with and (suspected) exposure to covid-19: Secondary | ICD-10-CM | POA: Diagnosis not present

## 2019-08-30 DIAGNOSIS — R531 Weakness: Secondary | ICD-10-CM

## 2019-08-30 NOTE — Progress Notes (Signed)
Temp 99.6 F (37.6 C) (Tympanic)   Ht 5\' 2"  (1.575 m)   Wt 160 lb (72.6 kg)   BMI 29.26 kg/m    Subjective:    Patient ID: , female    DOB: [birth year], 29 y.o.   MRN: [id number]  HPI: Katelyn Whitehead is a 29 y.o. female  Chief Complaint  Patient presents with  . Fatigue    Symptoms ongoing 2 days. Patient got COVID tested today.  . Fever  . Diarrhea  . Chills  . Generalized Body Aches   ABDOMINAL ISSUES Duration: weeks Nature: cramping, lower abdominal, relieves with BM Location: suprapubic". "lower abdominal quadrants  Severity: moderate  Radiation: no Alleviating factors: Tylenol Aggravating factors: Eating Treatments attempted: none Constipation: no Diarrhea: yes Episodes of diarrhea/day: 1-2x; some formed Mucous in the stool: no Heartburn: yes  Fatigue: yes Body aches: yes Nausea: no Vomiting: yes, x1 after eating salad Episodes of vomit/day: just once total Melena or hematochezia: no Rash: no Jaundice: no Fever/chills: yes, lower than 100 Weight loss: no   Patient also reports her legs have been weak and felt "wobbly" since August.  Her legs only feel weak when she is walking.  She has not fallen down and has no trouble with range of motion or trouble lifting her legs.  She thinks it may be due to her anxiety.  Also thinks it could be due to a low Vitamin B12 or Vitamin D.     Allergies  Allergen Reactions  . Influenza Vaccines Hives  . Sulfa Antibiotics     Hives Rash    Outpatient Encounter Medications as of 08/30/2019  Medication Sig  . acetaminophen (TYLENOL) 500 MG tablet Take 500 mg by mouth every 6 (six) hours as needed.  . Azelastine-Fluticasone 137-50 MCG/ACT SUSP Place 1 spray into the nose 2 (two) times daily.  . cetirizine (ZYRTEC) 10 MG tablet Take 1 tablet (10 mg total) by mouth daily.  . diphenhydrAMINE HCl (BENADRYL ALLERGY PO) Take by mouth.  . Drospirenone (SLYND) 4 MG TABS Take 1 tablet by mouth daily.  09/01/2019  LORazepam (ATIVAN) 0.5 MG tablet Take by mouth.  . sertraline (ZOLOFT) 50 MG tablet 75 mg.   . [DISCONTINUED] triamcinolone ointment (KENALOG) 0.1 %    No facility-administered encounter medications on file as of 08/30/2019.    Patient Active Problem List   Diagnosis Date Noted  . Suspected COVID-19 virus infection 08/30/2019  . Weakness 08/30/2019  . Mixed obsessional thoughts and acts 09/01/2018  . Fatigue 09/21/2017  . Weight gain 09/21/2017  . Numbness of toes 09/21/2017  . Bilateral low back pain without sciatica 09/21/2017  . Arthralgia 09/21/2017  . Vitamin D deficiency 05/13/2016  . B12 deficiency 05/13/2016  . Mild mitral and aortic regurgitation   . Panic disorder   . Depression, major, recurrent, moderate (HCC) 12/04/2014    Past Medical History:  Diagnosis Date  . ADHD (attention deficit hyperactivity disorder)   . Anxiety   . Asthma    childhood  . Depression   . Mild mitral and aortic regurgitation    a. 04/2013 Echo: EF 50-55%, mild AI, mild MR, no MVP.  05/2013 Palpitations    a. 2012 Holter: sinus tach, pvc's.  . Panic disorder     Relevant past medical, surgical, family and social history reviewed and updated as indicated. Interim medical history since our last visit reviewed. Allergies and medications reviewed and updated.  Review of Systems  Constitutional: Positive for activity  change, chills, fatigue and fever. Negative for appetite change.  HENT: Negative.  Negative for congestion, rhinorrhea, sinus pressure, sinus pain, sneezing, sore throat and trouble swallowing.   Respiratory: Negative.  Negative for chest tightness, shortness of breath and wheezing.   Cardiovascular: Negative.  Negative for chest pain.  Gastrointestinal: Positive for abdominal pain, diarrhea, nausea and vomiting. Negative for abdominal distention, blood in stool and constipation.  Genitourinary: Negative for decreased urine volume, difficulty urinating and frequency.    Musculoskeletal: Positive for myalgias. Negative for arthralgias, back pain, gait problem and neck stiffness.  Skin: Negative.  Negative for color change and rash.  Neurological: Positive for weakness.  Psychiatric/Behavioral: The patient is nervous/anxious.     Per HPI unless specifically indicated above     Objective:    Temp 99.6 F (37.6 C) (Tympanic)   Ht 5\' 2"  (1.575 m)   Wt 160 lb (72.6 kg)   BMI 29.26 kg/m   Wt Readings from Last 3 Encounters:  08/30/19 160 lb (72.6 kg)  03/12/19 159 lb (72.1 kg)  12/21/18 157 lb (71.2 kg)    Physical Exam Vitals and nursing note reviewed.  Constitutional:      General: She is not in acute distress.    Appearance: Normal appearance. She is not ill-appearing or toxic-appearing.  HENT:     Head: Normocephalic.     Right Ear: External ear normal.     Left Ear: External ear normal.     Nose: Nose normal. No congestion or rhinorrhea.     Mouth/Throat:     Mouth: Mucous membranes are moist.     Pharynx: Oropharynx is clear.  Eyes:     General: No scleral icterus.    Extraocular Movements: Extraocular movements intact.     Conjunctiva/sclera: Conjunctivae normal.  Pulmonary:     Effort: Pulmonary effort is normal. No respiratory distress.  Musculoskeletal:        General: Normal range of motion.     Cervical back: Normal range of motion. No rigidity.  Skin:    General: Skin is warm and dry.     Coloration: Skin is not jaundiced or pale.  Neurological:     General: No focal deficit present.     Mental Status: She is alert and oriented to person, place, and time.     Gait: Gait normal.  Psychiatric:        Mood and Affect: Mood normal.        Behavior: Behavior normal.        Thought Content: Thought content normal.        Judgment: Judgment normal.         Assessment & Plan:   Problem List Items Addressed This Visit      Other   Suspected COVID-19 virus infection - Primary    Given acute onset of symptoms, likely  viral cause for the fatigue, fever, diarrhea, nausea, and chills.  Patient reports receiving COVID-19 test today.  Will follow via mychart questionnaire.  Patient educated on length of symptoms for mild and severe disease.  Educated to hydrate, rest, and treat fever symptoms with Tylenol.  Return or call back to clinic if symptoms persist >14 days or new symptoms develop such as cough.  If unable to keep liquids down, be sure to go to ED in case of dehydration.  If sudden onset of chest pain or SOB, go to ED for evaluation.        Relevant Orders   MYCHART  COVID-19 HOME MONITORING PROGRAM   Temperature monitoring   Weakness    Ongoing problem since August.  Unclear etiology but differentials include: anxiety, anemia, low Vitamin D, low Vitamin B12, thyroid dysfunction, among other neurologic conditions.  Will order labs today and advised patient to come in as soon as cleared by [facility] to get them drawn and have a CPE.  If weakness worsens or sudden neurological symptoms develop, go to ED.      Relevant Orders   CBC with Differential/Platelet   TSH   Vitamin B12   VITAMIN D 25 Hydroxy (Vit-D Deficiency, Fractures)       Follow up plan: Return in about 3 weeks (around 09/20/2019) for CPE & labs.

## 2019-08-30 NOTE — Assessment & Plan Note (Signed)
Given acute onset of symptoms, likely viral cause for the fatigue, fever, diarrhea, nausea, and chills.  Patient reports receiving COVID-19 test today.  Will follow via mychart questionnaire.  Patient educated on length of symptoms for mild and severe disease.  Educated to hydrate, rest, and treat fever symptoms with Tylenol.  Return or call back to clinic if symptoms persist >14 days or new symptoms develop such as cough.  If unable to keep liquids down, be sure to go to ED in case of dehydration.  If sudden onset of chest pain or SOB, go to ED for evaluation.

## 2019-08-30 NOTE — Assessment & Plan Note (Signed)
Ongoing problem since August.  Unclear etiology but differentials include: anxiety, anemia, low Vitamin D, low Vitamin B12, thyroid dysfunction, among other neurologic conditions.  Will order labs today and advised patient to come in as soon as cleared by [facility] to get them drawn and have a CPE.  If weakness worsens or sudden neurological symptoms develop, go to ED.

## 2019-08-30 NOTE — Patient Instructions (Signed)
10 Things You Can Do to Manage Your COVID-19 Symptoms at Home If you have possible or confirmed COVID-19: 1. Stay home from work and school. And stay away from other public places. If you must go out, avoid using any kind of public transportation, ridesharing, or taxis. 2. Monitor your symptoms carefully. If your symptoms get worse, call your healthcare provider immediately. 3. Get rest and stay hydrated. 4. If you have a medical appointment, call the healthcare provider ahead of time and tell them that you have or may have COVID-19. 5. For medical emergencies, call 911 and notify the dispatch personnel that you have or may have COVID-19. 6. Cover your cough and sneezes with a tissue or use the inside of your elbow. 7. Wash your hands often with soap and water for at least 20 seconds or clean your hands with an alcohol-based hand sanitizer that contains at least 60% alcohol. 8. As much as possible, stay in a specific room and away from other people in your home. Also, you should use a separate bathroom, if available. If you need to be around other people in or outside of the home, wear a mask. 9. Avoid sharing personal items with other people in your household, like dishes, towels, and bedding. 10. Clean all surfaces that are touched often, like counters, tabletops, and doorknobs. Use household cleaning sprays or wipes according to the label instructions. [email or web address] 02/07/2019 This information is not intended to replace advice given to you by your health care provider. Make sure you discuss any questions you have with your health care provider. Document Revised: 07/12/2019 Document Reviewed: 07/12/2019 Elsevier Patient Education  2020 [employer]. 3 Key Steps to Take While Waiting for Your COVID-19 Test Result To help stop the spread of COVID-19, take these 3 key steps NOW while waiting for your test results: 1. Stay home and monitor your health. Stay home and monitor your health to  help protect your friends, family, and others from possibly getting COVID-19 from you. Stay home and away from others:  If possible, stay away from others, especially people who are at higher risk for getting very sick from COVID-19, such as older adults and people with other medical conditions.  If you have been in contact with someone with COVID-19, stay home and away from others for 14 days after your last contact with that person.  If you have a fever, cough or other symptoms of COVID-19, stay home and away from others (except to get medical care). Monitor your health:  Watch for fever, cough, shortness of breath, or other symptoms of COVID-19. Remember, symptoms may appear 2-14 days after exposure to COVID-19 and can include: ? Fever or chills ? Cough ? Shortness of breath or difficulty breathing ? Tiredness ? Muscle or body aches ? Headache ? New loss of taste or smell ? Sore throat ? Congestion or runny nose ? Nausea or vomiting ? Diarrhea 2. Think about the people you have recently been around. If you are diagnosed with COVID-19, a public health worker may call you to check on your health, discuss who you have been around, and ask where you spent time while you may have been able to spread COVID-19 to others. While you wait for your COVID-19 test result, think about everyone you have been around recently. This will be important information to give health workers if your test is positive.  Complete the information on the back of this page to help you remember everyone you have been  around. 3. Answer the phone call from the health department. If a public health worker calls you, answer the call to help slow the spread of COVID-19 in your community.  Discussions with health department staff are confidential. This means that your personal and medical information will be kept private and only shared with those who may need to know, like your health care provider.  Your name will not  be shared with those you came in contact with. The health department will only notify people you were in close contact with (within 6 feet for more than 15 minutes) that they might have been exposed to COVID-19. Think about the people you have recently been around If you test positive and are diagnosed with COVID-19, someone from the health department may call to check-in on your health, discuss who you have been around, and ask where you spent time while you may have been able to spread COVID-19 to others. This form can help you think about people you have recently been around so you will be ready if a public health worker calls you. Things to think about. Have you:  Gone to work or school?  Gotten together with others (eaten out at [employer], gone out for drinks, exercised with others or gone to a gym, had friends or family over to your house, volunteered, gone to a party, pool, or park)?  Gone to a store in person (e.g., grocery store, mall)?  Gone to in-person appointments (e.g., salon, barber, doctor's or dentist's office)?  Ridden in a car with others (e.g., [employer] or [employer]) or took public transportation?  Been inside a church, synagogue, mosque or other places of worship? Who lives with you?  ______________________________________________________________________  ______________________________________________________________________  ______________________________________________________________________  ______________________________________________________________________ Who have you been around (within 6 feet for more than 15 minutes) in the last 10 days? (You may have more people to list than the space provided. If so, write on the front of this sheet or a separate piece of paper.) Name ______________________________________________  Phone number ____________________________________  Date you last saw them _____________________________  Where you last saw them  ________________________________________________ Name ______________________________________________  Phone number ____________________________________  Date you last saw them _____________________________  Where you last saw them ________________________________________________ Name ______________________________________________  Phone number ____________________________________  Date you last saw them _____________________________  Where you last saw them ________________________________________________ Name ______________________________________________  Phone number ____________________________________  Date you last saw them _____________________________  Where you last saw them ________________________________________________ Name ______________________________________________  Phone number ____________________________________  Date you last saw them _____________________________  Where you last saw them ________________________________________________ What have you done in the last 10 days with other people? Activity _____________________________________________  Location _________________________________________  Date ____________________________________________ Activity _____________________________________________  Location _________________________________________  Date ____________________________________________ Activity _____________________________________________  Location _________________________________________  Date ____________________________________________ Activity _____________________________________________  Location _________________________________________  Date ____________________________________________ Activity _____________________________________________  Location _________________________________________  Date ____________________________________________ [email or web address] 03/05/2019 This information is not intended to  replace advice given to you by your health care provider. Make sure you discuss any questions you have with your health care provider. Document Revised: 07/12/2019 Document Reviewed: 07/12/2019 Elsevier Patient Education  2020 [employer].

## 2019-09-02 ENCOUNTER — Encounter: Payer: Self-pay | Admitting: Unknown Physician Specialty

## 2019-09-03 ENCOUNTER — Other Ambulatory Visit: Payer: Self-pay | Admitting: Nurse Practitioner

## 2019-09-03 MED ORDER — ONDANSETRON 4 MG PO TBDP: 4.0000 mg | ORAL_TABLET | Freq: Three times a day (TID) | ORAL | 0 refills | Status: DC | PRN

## 2019-09-06 ENCOUNTER — Telehealth: Payer: Self-pay | Admitting: Unknown Physician Specialty

## 2019-09-06 NOTE — Telephone Encounter (Signed)
Called pt to schedule appt for bump on her leg per provider. No answer, left vm

## 2019-09-10 ENCOUNTER — Other Ambulatory Visit: Payer: Self-pay

## 2019-09-10 ENCOUNTER — Ambulatory Visit (INDEPENDENT_AMBULATORY_CARE_PROVIDER_SITE_OTHER): Payer: No Typology Code available for payment source | Admitting: Nurse Practitioner

## 2019-09-10 ENCOUNTER — Encounter: Payer: Self-pay | Admitting: Nurse Practitioner

## 2019-09-10 VITALS — BP 104/66 | HR 95 | Temp 98.4°F | Ht 61.5 in | Wt 165.6 lb

## 2019-09-10 DIAGNOSIS — F410 Panic disorder [episodic paroxysmal anxiety] without agoraphobia: Secondary | ICD-10-CM | POA: Diagnosis not present

## 2019-09-10 DIAGNOSIS — Z0000 Encounter for general adult medical examination without abnormal findings: Secondary | ICD-10-CM | POA: Diagnosis not present

## 2019-09-10 DIAGNOSIS — Z30015 Encounter for initial prescription of vaginal ring hormonal contraceptive: Secondary | ICD-10-CM

## 2019-09-10 MED ORDER — SERTRALINE HCL 100 MG PO TABS: 100.0000 mg | ORAL_TABLET | Freq: Every day | ORAL | 0 refills | Status: DC

## 2019-09-10 MED ORDER — ETONOGESTREL-ETHINYL ESTRADIOL 0.12-0.015 MG/24HR VA RING: VAGINAL_RING | VAGINAL | 12 refills | Status: DC

## 2019-09-10 NOTE — Progress Notes (Signed)
BP 104/66 (BP Location: Left Arm, Patient Position: Sitting, Cuff Size: Normal)   Pulse 95   Temp 98.4 F (36.9 C) (Oral)   Ht 5' 1.5" (1.562 m)   Wt 165 lb 9.6 oz (75.1 kg)   SpO2 98%   BMI 30.78 kg/m    Subjective:    Patient ID: Katelyn Whitehead, female    DOB: September 25, 1990, 29 y.o.   MRN: [id number]  HPI: Katelyn Whitehead is a 29 y.o. female presenting on 09/10/2019 for comprehensive medical examination. Current medical complaints include:would like to change OCP, feels it is making her gain weight and is also the cause of her worsening mood symptoms.  She currently lives with: husband and daughter  ANXIETY/STRESS Duration:uncontrolled Anxious mood: yes  Excessive worrying: yes Irritability: yes  Sweating: no Nausea: no Palpitations:no Hyperventilation: no Panic attacks: no  Obscessions/compulsions: yes Depressed mood: yes Anhedonia: yes Weight changes: yes (gain) Insomnia: yes  Hypersomnia: no Fatigue/loss of energy: yes Feelings of worthlessness: no Feelings of guilt: no Impaired concentration/indecisiveness: yes Suicidal ideations: no  Crying spells: no Recent Stressors/Life Changes: yes   Relationship problems: no   Family stress: no     Financial stress: no    Job stress: yes    Recent death/loss: no  Depression Screen done today and results listed below:  Depression screen [facility] 2/9 09/10/2019 05/12/2016  Decreased Interest 1 3  Down, Depressed, Hopeless 1 3  PHQ - 2 Score 2 6  Altered sleeping 2 2  Tired, decreased energy 2 0  Change in appetite 2 1  Feeling bad or failure about yourself  2 2  Trouble concentrating 2 3  Moving slowly or fidgety/restless 1 2  Suicidal thoughts 0 0  PHQ-9 Score 13 16  Difficult doing work/chores Somewhat difficult -  Some encounter information is confidential and restricted. Go to Review Flowsheets activity to see all data.    The patient does not have a history of falls. I did complete a risk assessment for falls. A  plan of care for falls was not documented.   Past Medical History:  Past Medical History:  Diagnosis Date  . ADHD (attention deficit hyperactivity disorder)   . Anxiety   . Asthma    childhood  . Depression   . Mild mitral and aortic regurgitation    a. 04/2013 Echo: EF 50-55%, mild AI, mild MR, no MVP.  Marland Kitchen Palpitations    a. 2012 Holter: sinus tach, pvc's.  . Panic disorder     Surgical History:  Past Surgical History:  Procedure Laterality Date  . IUD REMOVAL N/A 11/22/2016   Procedure: LAPAROCOPIC INTRAUTERINE DEVICE (IUD) REMOVAL;  Surgeon: Linzie Collin, MD;  Location: [facility] ORS;  Service: Gynecology;  Laterality: N/A;  . WISDOM TOOTH EXTRACTION      Medications:  Current Outpatient Medications on File Prior to Visit  Medication Sig  . acetaminophen (TYLENOL) 500 MG tablet Take 500 mg by mouth every 6 (six) hours as needed.  . Azelastine-Fluticasone 137-50 MCG/ACT SUSP Place 1 spray into the nose 2 (two) times daily.  . cetirizine (ZYRTEC) 10 MG tablet Take 1 tablet (10 mg total) by mouth daily.  . diphenhydrAMINE HCl (BENADRYL ALLERGY PO) Take by mouth.  . Drospirenone (SLYND) 4 MG TABS Take 1 tablet by mouth daily.  Marland Kitchen LORazepam (ATIVAN) 0.5 MG tablet Take by mouth.  . ondansetron (ZOFRAN ODT) 4 MG disintegrating tablet Take 1 tablet (4 mg total) by mouth every 8 (eight) hours as  needed for nausea or vomiting. (Patient not taking: Reported on 09/10/2019)   No current facility-administered medications on file prior to visit.    Allergies:  Allergies  Allergen Reactions  . Influenza Vaccines Hives  . Sulfa Antibiotics     Hives Rash    Social History:  Social History   Socioeconomic History  . Marital status: Married    Spouse name: Not on file  . Number of children: Not on file  . Years of education: Not on file  . Highest education level: Not on file  Occupational History  . Not on file  Tobacco Use  . Smoking status: Never Smoker  . Smokeless  tobacco: Never Used  Substance and Sexual Activity  . Alcohol use: No    Alcohol/week: 0.0 standard drinks  . Drug use: No  . Sexual activity: Yes    Birth control/protection: Condom  Other Topics Concern  . Not on file  Social History Narrative  . Not on file   Social Determinants of Health   Financial Resource Strain:   . Difficulty of Paying Living Expenses: Not on file  Food Insecurity:   . Worried About Charity fundraiser in the Last Year: Not on file  . Ran Out of Food in the Last Year: Not on file  Transportation Needs:   . Lack of Transportation (Medical): Not on file  . Lack of Transportation (Non-Medical): Not on file  Physical Activity:   . Days of Exercise per Week: Not on file  . Minutes of Exercise per Session: Not on file  Stress:   . Feeling of Stress : Not on file  Social Connections:   . Frequency of Communication with Friends and Family: Not on file  . Frequency of Social Gatherings with Friends and Family: Not on file  . Attends Religious Services: Not on file  . Active Member of Clubs or Organizations: Not on file  . Attends Archivist Meetings: Not on file  . Marital Status: Not on file  Intimate Partner Violence:   . Fear of Current or Ex-Partner: Not on file  . Emotionally Abused: Not on file  . Physically Abused: Not on file  . Sexually Abused: Not on file   Social History   Tobacco Use  Smoking Status Never Smoker  Smokeless Tobacco Never Used   Social History   Substance and Sexual Activity  Alcohol Use No  . Alcohol/week: 0.0 standard drinks    Family History:  Family History  Problem Relation Age of Onset  . Depression Mother   . Diabetes Mother        type 2  . Hyperlipidemia Mother   . Hypertension Mother   . Melanoma Mother        skin  . Drug abuse Father   . Gout Father   . Depression Father   . Psychiatric Illness Father        PTSD  . ADD / ADHD Brother   . Depression Maternal Aunt   . Dementia  Maternal Grandmother   . Depression Maternal Grandmother   . Heart disease Maternal Grandfather   . Diabetes Paternal Grandmother        type 2  . Hypertension Paternal Grandmother     Past medical history, surgical history, medications, allergies, family history and social history reviewed with patient today and changes made to appropriate areas of the chart.   Review of Systems  Constitutional: Positive for malaise/fatigue.  HENT: Negative.  Negative  for congestion, sinus pain and sore throat.   Eyes: Negative.  Negative for blurred vision.  Respiratory: Negative.  Negative for cough, sputum production and wheezing.   Cardiovascular: Negative.  Negative for chest pain and palpitations.  Gastrointestinal: Negative.  Negative for constipation, diarrhea, nausea and vomiting.  Genitourinary: Negative.  Negative for dysuria and urgency.  Musculoskeletal: Negative.  Negative for back pain and myalgias.  Skin: Negative.  Negative for rash.  Neurological: Positive for weakness. Negative for dizziness, tingling, tremors and headaches.  Psychiatric/Behavioral: Positive for depression. Negative for suicidal ideas. The patient is nervous/anxious. The patient does not have insomnia.    All other ROS negative except what is listed above and in the HPI.      Objective:    BP 104/66 (BP Location: Left Arm, Patient Position: Sitting, Cuff Size: Normal)   Pulse 95   Temp 98.4 F (36.9 C) (Oral)   Ht 5' 1.5" (1.562 m)   Wt 165 lb 9.6 oz (75.1 kg)   SpO2 98%   BMI 30.78 kg/m   Wt Readings from Last 3 Encounters:  09/10/19 165 lb 9.6 oz (75.1 kg)  08/30/19 160 lb (72.6 kg)  03/12/19 159 lb (72.1 kg)    Physical Exam Vitals and nursing note reviewed.  Constitutional:      General: She is not in acute distress.    Appearance: Normal appearance. She is normal weight. She is not ill-appearing or toxic-appearing.  HENT:     Head: Normocephalic and atraumatic.     Right Ear: Tympanic  membrane, ear canal and external ear normal.     Left Ear: Tympanic membrane, ear canal and external ear normal.     Nose: Nose normal. No congestion or rhinorrhea.     Mouth/Throat:     Mouth: Mucous membranes are moist.     Pharynx: Oropharynx is clear. No oropharyngeal exudate.  Eyes:     General: No scleral icterus.    Extraocular Movements: Extraocular movements intact.     Pupils: Pupils are equal, round, and reactive to light.  Cardiovascular:     Rate and Rhythm: Normal rate and regular rhythm.     Pulses: Normal pulses.     Heart sounds: Normal heart sounds. No murmur.  Pulmonary:     Effort: Pulmonary effort is normal. No respiratory distress.     Breath sounds: No wheezing or rhonchi.  Abdominal:     General: Abdomen is flat. Bowel sounds are normal. There is no distension.     Palpations: Abdomen is soft.     Tenderness: There is no abdominal tenderness.  Musculoskeletal:        General: No swelling or tenderness. Normal range of motion.     Cervical back: Normal range of motion and neck supple. No rigidity or tenderness.     Right lower leg: No edema.     Left lower leg: No edema.  Skin:    General: Skin is warm and dry.     Capillary Refill: Capillary refill takes less than 2 seconds.     Coloration: Skin is not jaundiced or pale.  Neurological:     General: No focal deficit present.     Mental Status: She is alert and oriented to person, place, and time.     Motor: No weakness.     Gait: Gait normal.  Psychiatric:        Mood and Affect: Mood normal.        Behavior: Behavior normal.  Thought Content: Thought content normal.        Judgment: Judgment normal.    Results for orders placed or performed in visit on 09/10/19  Microscopic Examination   URINE  Result Value Ref Range   WBC, UA 6-10 (A) 0 - 5 /hpf   RBC 11-30 (A) 0 - 2 /hpf   Epithelial Cells (non renal) 0-10 0 - 10 /hpf   Crystals Present N/A   Crystal Type Amorphous Sediment N/A    Bacteria, UA Few (A) None seen/Few  Urine Culture, Reflex   URINE  Result Value Ref Range   Urine Culture, Routine WILL FOLLOW   UA/M w/rflx Culture, Routine   Specimen: Urine   URINE  Result Value Ref Range   Specific Gravity, UA 1.025 1.005 - 1.030   pH, UA 7.0 5.0 - 7.5   Color, UA Yellow Yellow   Appearance Ur Hazy (A) Clear   Leukocytes,UA 1+ (A) Negative   Protein,UA Trace (A) Negative/Trace   Glucose, UA Negative Negative   Ketones, UA Negative Negative   RBC, UA 2+ (A) Negative   Bilirubin, UA Negative Negative   Urobilinogen, Ur 0.2 0.2 - 1.0 mg/dL   Nitrite, UA Positive (A) Negative   Microscopic Examination See below:    Urinalysis Reflex Comment   Pregnancy, urine  Result Value Ref Range   Preg Test, Ur Negative Negative  Lipid Panel w/o Chol/HDL Ratio  Result Value Ref Range   Cholesterol, Total 172 100 - 199 mg/dL   Triglycerides 79 0 - 149 mg/dL   HDL 41 >32 mg/dL   VLDL Cholesterol Cal 15 5 - 40 mg/dL   LDL Chol Calc (NIH) 122 (H) 0 - 99 mg/dL      Assessment & Plan:   Problem List Items Addressed This Visit      Other   Panic disorder    Chronic, ongoing, exacerbated.  After discussion with patient, decision was made to increase Zoloft to 100mg  daily.  Patient advised to continue to f/u with therapist, next appointment in March.  Return to clinic in 4 weeks to f/u on mood.  Return or call to clinic in meantime with any concerns.      Relevant Medications   sertraline (ZOLOFT) 100 MG tablet   Annual physical exam - Primary    Will check UA, urine pregnancy test, A1c, CMP, and lipids today.  CBC, vitamin B12, vitamin D, and TSH checked from previous visit.  Hormonal contraceptive prescription for vaginal ring placed today, patient advised to finish current pill pack and then begin Nuva ring.  Educated patient that Nuva-ring is not ideal contraception to use while breastfeeding; patient currently trying to wean breastfeeding.      Relevant Medications     etonogestrel-ethinyl estradiol (NUVARING) 0.12-0.015 MG/24HR vaginal ring   Other Relevant Orders   UA/M w/rflx Culture, Routine (Completed)   Bayer DCA Hb A1c Waived   Comprehensive metabolic panel   Pregnancy, urine (Completed)   Lipid Panel w/o Chol/HDL Ratio (Completed)       Follow up plan: Return in about 4 weeks (around 10/08/2019) for f/u on mood.   LABORATORY TESTING:  - Pap smear: up to date  IMMUNIZATIONS:   - Tdap: Up to date - Influenza: Refused - allergic - Pneumovax: Not applicable - Prevnar: Not applicable - HPV: Refused - Zostavax vaccine: Not applicable  SCREENING: -Mammogram: Not applicable  - Colonoscopy: Not applicable  - Bone Density: Not applicable  -Hearing Test: Not applicable  -  Spirometry: Not applicable   PATIENT COUNSELING:   Advised to take 1 mg of folate supplement per day if capable of pregnancy.   Sexuality: Discussed sexually transmitted diseases, partner selection, use of condoms, avoidance of unintended pregnancy  and contraceptive alternatives.  dvised to avoid cigarette smoking.  I discussed with the patient that most people either abstain from alcohol or drink within safe limits (<=14/week and <=4 drinks/occasion for males, <=7/weeks and <= 3 drinks/occasion for females) and that the risk for alcohol disorders and other health effects rises proportionally with the number of drinks per week and how often a drinker exceeds daily limits.  Discussed cessation/primary prevention of drug use and availability of treatment for abuse.   Diet: Encouraged to adjust caloric intake to maintain  or achieve ideal body weight, to reduce intake of dietary saturated fat and total fat, to limit sodium intake by avoiding high sodium foods and not adding table salt, and to maintain adequate dietary potassium and calcium preferably from fresh fruits, vegetables, and low-fat dairy products.    Stressed the importance of regular exercise.  Injury  prevention: Discussed safety belts, safety helmets, smoke detector, smoking near bedding or upholstery.   Dental health: Discussed importance of regular tooth brushing, flossing, and dental visits.    NEXT PREVENTATIVE PHYSICAL DUE IN 1 YEAR. Return in about 4 weeks (around 10/08/2019) for f/u on mood.

## 2019-09-11 DIAGNOSIS — Z0000 Encounter for general adult medical examination without abnormal findings: Secondary | ICD-10-CM | POA: Insufficient documentation

## 2019-09-11 LAB — LIPID PANEL W/O CHOL/HDL RATIO
Cholesterol, Total: 172 mg/dL (ref 100–199)
HDL: 41 mg/dL (ref >39)
LDL Chol Calc (NIH): 116 mg/dL — ABNORMAL HIGH (ref 0–99)
Triglycerides: 79 mg/dL (ref 0–149)
VLDL Cholesterol Cal: 15 mg/dL (ref 5–40)

## 2019-09-11 NOTE — Assessment & Plan Note (Signed)
Chronic, ongoing, exacerbated.  After discussion with patient, decision was made to increase Zoloft to 100mg  daily.  Patient advised to continue to f/u with therapist, next appointment in March.  Return to clinic in 4 weeks to f/u on mood.  Return or call to clinic in meantime with any concerns.

## 2019-09-11 NOTE — Assessment & Plan Note (Addendum)
Will check UA, urine pregnancy test, A1c, CMP, and lipids today.  CBC, vitamin B12, vitamin D, and TSH checked from previous visit.  Hormonal contraceptive prescription for vaginal ring placed today, patient advised to finish current pill pack and then begin Nuva ring.  Educated patient that Nuva-ring is not ideal contraception to use while breastfeeding; patient currently trying to wean breastfeeding.

## 2019-09-13 ENCOUNTER — Telehealth: Payer: Self-pay | Admitting: Nurse Practitioner

## 2019-09-13 ENCOUNTER — Encounter: Payer: Self-pay | Admitting: Unknown Physician Specialty

## 2019-09-13 ENCOUNTER — Other Ambulatory Visit
Admission: RE | Admit: 2019-09-13 | Discharge: 2019-09-13 | Disposition: A | Discharge status: Home / Self Care | Payer: No Typology Code available for payment source | Source: Ambulatory Visit | Attending: Nurse Practitioner | Admitting: Nurse Practitioner

## 2019-09-13 DIAGNOSIS — R531 Weakness: Secondary | ICD-10-CM | POA: Insufficient documentation

## 2019-09-13 DIAGNOSIS — Z0000 Encounter for general adult medical examination without abnormal findings: Secondary | ICD-10-CM | POA: Insufficient documentation

## 2019-09-13 LAB — CBC WITH DIFFERENTIAL/PLATELET
Abs Immature Granulocytes: 0.03 10*3/uL (ref 0.00–0.07)
Basophils Absolute: 0.1 10*3/uL (ref 0.0–0.1)
Basophils Relative: 1 %
Eosinophils Absolute: 0.4 10*3/uL (ref 0.0–0.5)
Eosinophils Relative: 6 %
HCT: 41.8 % (ref 36.0–46.0)
Hemoglobin: 13.5 g/dL (ref 12.0–15.0)
Immature Granulocytes: 0 %
Lymphocytes Relative: 25 %
Lymphs Abs: 1.9 10*3/uL (ref 0.7–4.0)
MCH: 26.7 pg (ref 26.0–34.0)
MCHC: 32.3 g/dL (ref 30.0–36.0)
MCV: 82.6 fL (ref 80.0–100.0)
Monocytes Absolute: 0.8 10*3/uL (ref 0.1–1.0)
Monocytes Relative: 11 %
Neutro Abs: 4.2 10*3/uL (ref 1.7–7.7)
Neutrophils Relative %: 57 %
Platelets: 345 10*3/uL (ref 150–400)
RBC: 5.06 MIL/uL (ref 3.87–5.11)
RDW: 13.4 % (ref 11.5–15.5)
WBC: 7.3 10*3/uL (ref 4.0–10.5)
nRBC: 0 % (ref 0.0–0.2)

## 2019-09-13 LAB — COMPREHENSIVE METABOLIC PANEL
ALT: 21 U/L (ref 0–44)
AST: 20 U/L (ref 15–41)
Albumin: 3.8 g/dL (ref 3.5–5.0)
Alkaline Phosphatase: 77 U/L (ref 38–126)
Anion gap: 8 (ref 5–15)
BUN: 15 mg/dL (ref 6–20)
CO2: 25 mmol/L (ref 22–32)
Calcium: 9.1 mg/dL (ref 8.9–10.3)
Chloride: 104 mmol/L (ref 98–111)
Creatinine, Ser: 0.83 mg/dL (ref 0.44–1.00)
GFR calc Af Amer: >60 mL/min (ref >60)
GFR calc non Af Amer: >60 mL/min (ref >60)
Glucose, Bld: 113 mg/dL — ABNORMAL HIGH (ref 70–99)
Potassium: 3.7 mmol/L (ref 3.5–5.1)
Sodium: 137 mmol/L (ref 135–145)
Total Bilirubin: 0.4 mg/dL (ref 0.3–1.2)
Total Protein: 7.5 g/dL (ref 6.5–8.1)

## 2019-09-13 LAB — HEMOGLOBIN A1C
Hgb A1c MFr Bld: 5.5 % (ref 4.8–5.6)
Mean Plasma Glucose: 111.15 mg/dL

## 2019-09-13 LAB — TSH: TSH: 1.543 u[IU]/mL (ref 0.350–4.500)

## 2019-09-13 LAB — VITAMIN D 25 HYDROXY (VIT D DEFICIENCY, FRACTURES): Vit D, 25-Hydroxy: 39.03 ng/mL (ref 30–100)

## 2019-09-13 LAB — VITAMIN B12: Vitamin B-12: 255 pg/mL (ref 180–914)

## 2019-09-13 MED ORDER — NITROFURANTOIN MONOHYD MACRO 100 MG PO CAPS: 100.0000 mg | ORAL_CAPSULE | Freq: Two times a day (BID) | ORAL | 0 refills | Status: AC

## 2019-09-13 NOTE — Telephone Encounter (Signed)
I called and talked to Katelyn Whitehead about her labs.  I sent in treatment for her urine to her pharmacy.  Will await the rest of her lab results.

## 2019-09-14 ENCOUNTER — Other Ambulatory Visit: Payer: No Typology Code available for payment source

## 2019-09-14 LAB — UA/M W/RFLX CULTURE, ROUTINE
Bilirubin, UA: NEGATIVE
Glucose, UA: NEGATIVE
Ketones, UA: NEGATIVE
Nitrite, UA: POSITIVE — AB
Specific Gravity, UA: 1.025 (ref 1.005–1.030)
Urobilinogen, Ur: 0.2 mg/dL (ref 0.2–1.0)
pH, UA: 7 (ref 5.0–7.5)

## 2019-09-14 LAB — PREGNANCY, URINE: Preg Test, Ur: NEGATIVE

## 2019-09-14 LAB — MICROSCOPIC EXAMINATION

## 2019-09-14 LAB — URINE CULTURE, REFLEX

## 2019-10-08 ENCOUNTER — Ambulatory Visit: Payer: No Typology Code available for payment source | Admitting: Nurse Practitioner

## 2019-10-08 NOTE — Progress Notes (Deleted)
There were no vitals taken for this visit.   Subjective:    Patient ID: Katelyn Whitehead, female    DOB: Sep 23, 1990, 29 y.o.   MRN: [id number]  HPI: Katelyn Whitehead is a 29 y.o. female  No chief complaint on file.  ANXIETY/STRESS Duration:{Blank single:19197::"controlled","uncontrolled","better","worse","exacerbated","stable"} Anxious mood: {Blank single:19197::"yes","no"}  Excessive worrying: {Blank single:19197::"yes","no"} Irritability: {Blank single:19197::"yes","no"}  Sweating: {Blank single:19197::"yes","no"} Nausea: {Blank single:19197::"yes","no"} Palpitations:{Blank single:19197::"yes","no"} Hyperventilation: {Blank single:19197::"yes","no"} Panic attacks: {Blank single:19197::"yes","no"} Agoraphobia: {Blank single:19197::"yes","no"}  Obscessions/compulsions: {Blank single:19197::"yes","no"} Depressed mood: {Blank single:19197::"yes","no"} Depression screen [facility] 2/9 09/10/2019 05/12/2016  Decreased Interest 1 3  Down, Depressed, Hopeless 1 3  PHQ - 2 Score 2 6  Altered sleeping 2 2  Tired, decreased energy 2 0  Change in appetite 2 1  Feeling bad or failure about yourself  2 2  Trouble concentrating 2 3  Moving slowly or fidgety/restless 1 2  Suicidal thoughts 0 0  PHQ-9 Score 13 16  Difficult doing work/chores Somewhat difficult -  Some encounter information is confidential and restricted. Go to Review Flowsheets activity to see all data.   Anhedonia: {Blank single:19197::"yes","no"} Weight changes: {Blank single:19197::"yes","no"} Insomnia: {Blank single:19197::"yes","no"} {Blank single:19197::"hard to fall asleep","hard to stay asleep"}  Hypersomnia: {Blank single:19197::"yes","no"} Fatigue/loss of energy: {Blank single:19197::"yes","no"} Feelings of worthlessness: {Blank single:19197::"yes","no"} Feelings of guilt: {Blank single:19197::"yes","no"} Impaired concentration/indecisiveness: {Blank single:19197::"yes","no"} Suicidal ideations: {Blank  single:19197::"yes","no"}  Crying spells: {Blank single:19197::"yes","no"} Recent Stressors/Life Changes: {Blank single:19197::"yes","no"}   Relationship problems: {Blank single:19197::"yes","no"}   Family stress: {Blank single:19197::"yes","no"}     Financial stress: {Blank single:19197::"yes","no"}    Job stress: {Blank single:19197::"yes","no"}    Recent death/loss: {Blank single:19197::"yes","no"}  DEPRESSION Mood status: {Blank single:19197::"controlled","uncontrolled","better","worse","exacerbated","stable"} Satisfied with current treatment?: {Blank single:19197::"yes","no"} Symptom severity: {Blank single:19197::"mild","moderate","severe"}  Duration of current treatment : {Blank single:19197::"chronic","months","years"} Side effects: {Blank single:19197::"yes","no"} Medication compliance: {Blank single:19197::"excellent compliance","good compliance","fair compliance","poor compliance"} Psychotherapy/counseling: {Blank single:19197::"yes","no"} {Blank single:19197::"current","in the past"} Previous psychiatric medications: {Blank multiple:19196::"abilify","amitryptiline","buspar","celexa","cymbalta","depakote","effexor","lamictal","lexapro","lithium","nortryptiline","paxil","prozac","pristiq (desvenlafaxine","seroquel","wellbutrin","zoloft","zyprexa"} Depressed mood: {Blank single:19197::"yes","no"} Anxious mood: {Blank single:19197::"yes","no"} Anhedonia: {Blank single:19197::"yes","no"} Significant weight loss or gain: {Blank single:19197::"yes","no"} Insomnia: {Blank single:19197::"yes","no"} {Blank single:19197::"hard to fall asleep","hard to stay asleep"} Fatigue: {Blank single:19197::"yes","no"} Feelings of worthlessness or guilt: {Blank single:19197::"yes","no"} Impaired concentration/indecisiveness: {Blank single:19197::"yes","no"} Suicidal ideations: {Blank single:19197::"yes","no"} Hopelessness: {Blank single:19197::"yes","no"} Crying spells: {Blank  single:19197::"yes","no"} Depression screen [facility] 2/9 09/10/2019 05/12/2016  Decreased Interest 1 3  Down, Depressed, Hopeless 1 3  PHQ - 2 Score 2 6  Altered sleeping 2 2  Tired, decreased energy 2 0  Change in appetite 2 1  Feeling bad or failure about yourself  2 2  Trouble concentrating 2 3  Moving slowly or fidgety/restless 1 2  Suicidal thoughts 0 0  PHQ-9 Score 13 16  Difficult doing work/chores Somewhat difficult -  Some encounter information is confidential and restricted. Go to Review Flowsheets activity to see all data.   Allergies  Allergen Reactions  . Influenza Vaccines Hives  . Sulfa Antibiotics     Hives Rash   Outpatient Encounter Medications as of 10/08/2019  Medication Sig  . acetaminophen (TYLENOL) 500 MG tablet Take 500 mg by mouth every 6 (six) hours as needed.  . Azelastine-Fluticasone 137-50 MCG/ACT SUSP Place 1 spray into the nose 2 (two) times daily.  . cetirizine (ZYRTEC) 10 MG tablet Take 1 tablet (10 mg total) by mouth daily.  . diphenhydrAMINE HCl (BENADRYL ALLERGY PO) Take by mouth.  . Drospirenone (SLYND) 4 MG TABS Take 1 tablet by mouth daily.  Marland Kitchen etonogestrel-ethinyl estradiol (NUVARING) 0.12-0.015 MG/24HR vaginal ring Insert vaginally and leave in  place for 3 consecutive weeks, then remove for 1 week.  Marland Kitchen LORazepam (ATIVAN) 0.5 MG tablet Take by mouth.  . ondansetron (ZOFRAN ODT) 4 MG disintegrating tablet Take 1 tablet (4 mg total) by mouth every 8 (eight) hours as needed for nausea or vomiting. (Patient not taking: Reported on 09/10/2019)  . sertraline (ZOLOFT) 100 MG tablet Take 1 tablet (100 mg total) by mouth daily.   No facility-administered encounter medications on file as of 10/08/2019.   Patient Active Problem List   Diagnosis Date Noted  . Annual physical exam 09/11/2019  . Weakness 08/30/2019  . Mixed obsessional thoughts and acts 09/01/2018  . Fatigue 09/21/2017  . Weight gain 09/21/2017  . Numbness of toes 09/21/2017  . Bilateral low  back pain without sciatica 09/21/2017  . Arthralgia 09/21/2017  . Vitamin D deficiency 05/13/2016  . B12 deficiency 05/13/2016  . Mild mitral and aortic regurgitation   . Panic disorder   . Depression, major, recurrent, moderate (HCC) 12/04/2014   Past Medical History:  Diagnosis Date  . ADHD (attention deficit hyperactivity disorder)   . Anxiety   . Asthma    childhood  . Depression   . Mild mitral and aortic regurgitation    a. 04/2013 Echo: EF 50-55%, mild AI, mild MR, no MVP.  Marland Kitchen Palpitations    a. 2012 Holter: sinus tach, pvc's.  . Panic disorder     Review of Systems  Per HPI unless specifically indicated above     Objective:    There were no vitals taken for this visit.  Wt Readings from Last 3 Encounters:  09/10/19 165 lb 9.6 oz (75.1 kg)  08/30/19 160 lb (72.6 kg)  03/12/19 159 lb (72.1 kg)    Physical Exam  Results for orders placed or performed during the hospital encounter of 09/13/19  Comprehensive metabolic panel  Result Value Ref Range   Sodium 137 135 - 145 mmol/L   Potassium 3.7 3.5 - 5.1 mmol/L   Chloride 104 98 - 111 mmol/L   CO2 25 22 - 32 mmol/L   Glucose, Bld 113 (H) 70 - 99 mg/dL   BUN 15 6 - 20 mg/dL   Creatinine, Ser 1.74 0.44 - 1.00 mg/dL   Calcium 9.1 8.9 - 08.1 mg/dL   Total Protein 7.5 6.5 - 8.1 g/dL   Albumin 3.8 3.5 - 5.0 g/dL   AST 20 15 - 41 U/L   ALT 21 0 - 44 U/L   Alkaline Phosphatase 77 38 - 126 U/L   Total Bilirubin 0.4 0.3 - 1.2 mg/dL   GFR calc non Af Amer >60 >60 mL/min   GFR calc Af Amer >60 >60 mL/min   Anion gap 8 5 - 15  VITAMIN D 25 Hydroxy (Vit-D Deficiency, Fractures)  Result Value Ref Range   Vit D, 25-Hydroxy 39.03 30 - 100 ng/mL  Vitamin B12  Result Value Ref Range   Vitamin B-12 255 180 - 914 pg/mL  TSH  Result Value Ref Range   TSH 1.543 0.350 - 4.500 uIU/mL  CBC with Differential/Platelet  Result Value Ref Range   WBC 7.3 4.0 - 10.5 K/uL   RBC 5.06 3.87 - 5.11 MIL/uL   Hemoglobin 13.5 12.0 - 15.0  g/dL   HCT 44.8 18.5 - 63.1 %   MCV 82.6 80.0 - 100.0 fL   MCH 26.7 26.0 - 34.0 pg   MCHC 32.3 30.0 - 36.0 g/dL   RDW 49.7 02.6 - 37.8 %   Platelets 345  150 - 400 K/uL   nRBC 0.0 0.0 - 0.2 %   Neutrophils Relative % 57 %   Neutro Abs 4.2 1.7 - 7.7 K/uL   Lymphocytes Relative 25 %   Lymphs Abs 1.9 0.7 - 4.0 K/uL   Monocytes Relative 11 %   Monocytes Absolute 0.8 0.1 - 1.0 K/uL   Eosinophils Relative 6 %   Eosinophils Absolute 0.4 0.0 - 0.5 K/uL   Basophils Relative 1 %   Basophils Absolute 0.1 0.0 - 0.1 K/uL   Immature Granulocytes 0 %   Abs Immature Granulocytes 0.03 0.00 - 0.07 K/uL  Hemoglobin A1c  Result Value Ref Range   Hgb A1c MFr Bld 5.5 4.8 - 5.6 %   Mean Plasma Glucose 111.15 mg/dL      Assessment & Plan:   Problem List Items Addressed This Visit    None       Follow up plan: No follow-ups on file.

## 2019-11-15 ENCOUNTER — Ambulatory Visit: Payer: No Typology Code available for payment source | Admitting: Nurse Practitioner

## 2019-12-02 ENCOUNTER — Encounter: Payer: Self-pay | Admitting: Nurse Practitioner

## 2019-12-03 ENCOUNTER — Telehealth (INDEPENDENT_AMBULATORY_CARE_PROVIDER_SITE_OTHER): Payer: No Typology Code available for payment source | Admitting: Nurse Practitioner

## 2019-12-03 ENCOUNTER — Encounter: Payer: Self-pay | Admitting: Nurse Practitioner

## 2019-12-03 VITALS — HR 82

## 2019-12-03 DIAGNOSIS — U071 COVID-19: Secondary | ICD-10-CM | POA: Diagnosis not present

## 2019-12-03 DIAGNOSIS — Z8616 Personal history of COVID-19: Secondary | ICD-10-CM | POA: Insufficient documentation

## 2019-12-03 NOTE — Patient Instructions (Signed)
10 Things You Can Do to Manage Your COVID-19 Symptoms at Home If you have possible or confirmed COVID-19: 1. Stay home from work and school. And stay away from other public places. If you must go out, avoid using any kind of public transportation, ridesharing, or taxis. 2. Monitor your symptoms carefully. If your symptoms get worse, call your healthcare provider immediately. 3. Get rest and stay hydrated. 4. If you have a medical appointment, call the healthcare provider ahead of time and tell them that you have or may have COVID-19. 5. For medical emergencies, call 911 and notify the dispatch personnel that you have or may have COVID-19. 6. Cover your cough and sneezes with a tissue or use the inside of your elbow. 7. Wash your hands often with soap and water for at least 20 seconds or clean your hands with an alcohol-based hand sanitizer that contains at least 60% alcohol. 8. As much as possible, stay in a specific room and away from other people in your home. Also, you should use a separate bathroom, if available. If you need to be around other people in or outside of the home, wear a mask. 9. Avoid sharing personal items with other people in your household, like dishes, towels, and bedding. 10. Clean all surfaces that are touched often, like counters, tabletops, and doorknobs. Use household cleaning sprays or wipes according to the label instructions. cdc.gov/coronavirus 02/07/2019 This information is not intended to replace advice given to you by your health care provider. Make sure you discuss any questions you have with your health care provider. Document Revised: 07/12/2019 Document Reviewed: 07/12/2019 Elsevier Patient Education  2020 Elsevier Inc.  

## 2019-12-03 NOTE — Progress Notes (Signed)
Pulse 82   SpO2 97%    Subjective:    Patient ID: Katelyn Whitehead, female    DOB: 11-Jan-1991, 29 y.o.   MRN: [id number]  HPI: Katelyn Whitehead is a 29 y.o. female presenting with COVID-19 symptoms.  Chief Complaint  Patient presents with  . Covid Vaccine     Diagnosed with COVID on Saturday   UPPER RESPIRATORY TRACT INFECTION Onset: Saturday, tested positive 12/01/2019 Worst symptom: drainage, sore throat, some chest tightness Fever: low grade, 100.1 Cough: yes; in morning productive with yellow sputum Shortness of breath: yes with activity Wheezing: no Chest pain: no Chest tightness: yes Chest congestion: yes Nasal congestion: yes Runny nose: no Post nasal drip: yes Sneezing: yes Sore throat: yes Swollen glands: no Sinus pressure: no Headache: no Face pain: no Toothache: no Ear pain: yes left Ear pressure: no  Eyes red/itching:yes Eye drainage/crusting: no  Nausea: yes Vomiting: no Appetite: decreased Rash: no Fatigue: no Sick contacts: no Strep contacts: no  Context: stable Recurrent sinusitis: no Relief with OTC cold/cough medications: yes  Treatments attempted: vitamin D, Vitamin C, Zinc, Tylenol Cold/sinus    Allergies  Allergen Reactions  . Influenza Vaccines Hives  . Sulfa Antibiotics     Hives Rash   Outpatient Encounter Medications as of 12/03/2019  Medication Sig  . acetaminophen (TYLENOL) 500 MG tablet Take 500 mg by mouth every 6 (six) hours as needed.  . Azelastine-Fluticasone 137-50 MCG/ACT SUSP Place 1 spray into the nose 2 (two) times daily.  . cetirizine (ZYRTEC) 10 MG tablet Take 1 tablet (10 mg total) by mouth daily.  . diphenhydrAMINE HCl (BENADRYL ALLERGY PO) Take by mouth.  . Drospirenone (SLYND) 4 MG TABS Take 1 tablet by mouth daily.  Marland Kitchen etonogestrel-ethinyl estradiol (NUVARING) 0.12-0.015 MG/24HR vaginal ring Insert vaginally and leave in place for 3 consecutive weeks, then remove for 1 week.  Marland Kitchen LORazepam (ATIVAN) 0.5 MG tablet  Take by mouth.  . ondansetron (ZOFRAN ODT) 4 MG disintegrating tablet Take 1 tablet (4 mg total) by mouth every 8 (eight) hours as needed for nausea or vomiting. (Patient not taking: Reported on 09/10/2019)  . sertraline (ZOLOFT) 50 MG tablet 75 mg.   . VYVANSE 20 MG capsule Take 20 mg by mouth every morning.  . [DISCONTINUED] sertraline (ZOLOFT) 100 MG tablet Take 1 tablet (100 mg total) by mouth daily.   No facility-administered encounter medications on file as of 12/03/2019.   Patient Active Problem List   Diagnosis Date Noted  . COVID-19 12/03/2019  . Annual physical exam 09/11/2019  . Weakness 08/30/2019  . Mixed obsessional thoughts and acts 09/01/2018  . Fatigue 09/21/2017  . Weight gain 09/21/2017  . Numbness of toes 09/21/2017  . Bilateral low back pain without sciatica 09/21/2017  . Arthralgia 09/21/2017  . Vitamin D deficiency 05/13/2016  . B12 deficiency 05/13/2016  . Mild mitral and aortic regurgitation   . Panic disorder   . Depression, major, recurrent, moderate (HCC) 12/04/2014   Past Medical History:  Diagnosis Date  . ADHD (attention deficit hyperactivity disorder)   . Anxiety   . Asthma    childhood  . Depression   . Mild mitral and aortic regurgitation    a. 04/2013 Echo: EF 50-55%, mild AI, mild MR, no MVP.  Marland Kitchen Mitral valve prolapse   . Palpitations    a. 2012 Holter: sinus tach, pvc's.  . Panic disorder    Relevant past medical, surgical, family and social history reviewed and updated as  indicated. Interim medical history since our last visit reviewed.  Review of Systems  Constitutional: Positive for appetite change and fever (low grade). Negative for activity change, chills, diaphoresis and fatigue.  HENT: Positive for congestion, ear pain, postnasal drip, rhinorrhea and sore throat. Negative for ear discharge, hearing loss, nosebleeds, sinus pressure, sinus pain and voice change.   Eyes: Positive for redness and itching. Negative for pain, discharge and  visual disturbance.  Respiratory: Positive for cough (in morning), chest tightness and shortness of breath (with activity). Negative for wheezing.   Cardiovascular: Negative for chest pain and palpitations.  Gastrointestinal: Positive for nausea. Negative for abdominal pain, blood in stool, constipation and vomiting.  Skin: Negative.  Negative for color change and rash.  Neurological: Negative.  Negative for dizziness, weakness, light-headedness and headaches.  Hematological: Negative for adenopathy.  Psychiatric/Behavioral: Negative for confusion, decreased concentration and sleep disturbance. The patient is nervous/anxious.    Per HPI unless specifically indicated above     Objective:    Pulse 82   SpO2 97%   Wt Readings from Last 3 Encounters:  09/10/19 165 lb 9.6 oz (75.1 kg)  08/30/19 160 lb (72.6 kg)  03/12/19 159 lb (72.1 kg)    Physical Exam Vitals and nursing note reviewed.  Constitutional:      General: She is not in acute distress.    Appearance: Normal appearance. She is normal weight. She is not toxic-appearing.  HENT:     Head: Normocephalic.     Right Ear: External ear normal.     Left Ear: External ear normal.     Nose: Congestion present. No rhinorrhea.     Mouth/Throat:     Mouth: Mucous membranes are moist.     Pharynx: Oropharynx is clear. No oropharyngeal exudate or posterior oropharyngeal erythema.  Eyes:     General: No scleral icterus.    Extraocular Movements: Extraocular movements intact.  Cardiovascular:     Rate and Rhythm: Normal rate.     Comments: Unable to assess heart sounds via virtual visit Pulmonary:     Effort: Pulmonary effort is normal. No respiratory distress.     Comments: Unable to assess breath sounds via virtual visit Abdominal:     Comments: Unable to assess via virtual visit  Musculoskeletal:        General: Normal range of motion.     Cervical back: Normal range of motion.  Lymphadenopathy:     Cervical: No cervical  adenopathy.  Skin:    Coloration: Skin is not jaundiced or pale.  Neurological:     General: No focal deficit present.     Mental Status: She is alert and oriented to person, place, and time.     Motor: No weakness.     Gait: Gait normal.  Psychiatric:        Mood and Affect: Mood normal.        Behavior: Behavior normal.        Thought Content: Thought content normal.        Judgment: Judgment normal.       Assessment & Plan:   Problem List Items Addressed This Visit      Other   COVID-19 - Primary    Acute, ongoing.  Continue supportive care with plenty of hydration, rest, tylenol cold/sinus.  Advised to resume allergy medications for better control of allergy symptoms.  If symptoms do not improve by end of week, may consider steroids to help improve symptoms.  With any sudden  onset of chest pain or shortness of breath, go to ED.          Follow up plan: Return if symptoms worsen or fail to improve.

## 2019-12-03 NOTE — Assessment & Plan Note (Signed)
Acute, ongoing.  Continue supportive care with plenty of hydration, rest, tylenol cold/sinus.  Advised to resume allergy medications for better control of allergy symptoms.  If symptoms do not improve by end of week, may consider steroids to help improve symptoms.  With any sudden onset of chest pain or shortness of breath, go to ED.

## 2019-12-06 ENCOUNTER — Encounter: Payer: Self-pay | Admitting: Nurse Practitioner

## 2019-12-06 ENCOUNTER — Telehealth (INDEPENDENT_AMBULATORY_CARE_PROVIDER_SITE_OTHER): Payer: No Typology Code available for payment source | Admitting: Nurse Practitioner

## 2019-12-06 VITALS — HR 98 | Temp 98.9°F

## 2019-12-06 DIAGNOSIS — J069 Acute upper respiratory infection, unspecified: Secondary | ICD-10-CM | POA: Diagnosis not present

## 2019-12-06 DIAGNOSIS — U071 COVID-19: Secondary | ICD-10-CM | POA: Diagnosis not present

## 2019-12-06 MED ORDER — BENZONATATE 100 MG PO CAPS: 100.0000 mg | ORAL_CAPSULE | Freq: Three times a day (TID) | ORAL | 0 refills | Status: DC | PRN

## 2019-12-06 MED ORDER — PREDNISONE 20 MG PO TABS: 40.0000 mg | ORAL_TABLET | Freq: Every day | ORAL | 0 refills | Status: DC

## 2019-12-06 NOTE — Progress Notes (Signed)
Pulse 98   Temp 98.9 F (37.2 C)   SpO2 97%    Subjective:    Patient ID: Katelyn Whitehead, female    DOB: [birth year], 29 y.o.   MRN: [id number]  HPI: Katelyn Whitehead is a 29 y.o. female preesnting with back and chest pain  Chief Complaint  Patient presents with  . Back Pain  . Chest Pain    Patient states that it is better she was    UPPER RESPIRATORY TRACT INFECTION Worst symptom: Fever: yes Cough: yes; dry cough Shortness of breath: no Wheezing: no Chest pain: yes Chest tightness: yes Chest congestion: no Nasal congestion: yes Runny nose: no Post nasal drip: no Sneezing: yes Sore throat: no Swollen glands: no Sinus pressure: no Headache: no Face pain: no Toothache: no Ear pain: no  Ear pressure: no  Eyes red/itching:no Eye drainage/crusting: no  Vomiting: no Rash: no Fatigue: yes Sick contacts: no Strep contacts: no  Context: stable Recurrent sinusitis: no Relief with OTC cold/cough medications: no  Treatments attempted: Mucinex, hydration   Allergies  Allergen Reactions  . Influenza Vaccines Hives  . Sulfa Antibiotics     Hives Rash   Outpatient Encounter Medications as of 12/06/2019  Medication Sig  . acetaminophen (TYLENOL) 500 MG tablet Take 500 mg by mouth every 6 (six) hours as needed.  . Azelastine-Fluticasone 137-50 MCG/ACT SUSP Place 1 spray into the nose 2 (two) times daily.  . benzonatate (TESSALON) 100 MG capsule Take 1 capsule (100 mg total) by mouth 3 (three) times daily as needed for cough.  . cetirizine (ZYRTEC) 10 MG tablet Take 1 tablet (10 mg total) by mouth daily.  . diphenhydrAMINE HCl (BENADRYL ALLERGY PO) Take by mouth.  . etonogestrel-ethinyl estradiol (NUVARING) 0.12-0.015 MG/24HR vaginal ring Insert vaginally and leave in place for 3 consecutive weeks, then remove for 1 week.  Marland Kitchen LORazepam (ATIVAN) 0.5 MG tablet Take by mouth.  . ondansetron (ZOFRAN ODT) 4 MG disintegrating tablet Take 1 tablet (4 mg total) by mouth every  8 (eight) hours as needed for nausea or vomiting. (Patient not taking: Reported on 09/10/2019)  . predniSONE (DELTASONE) 20 MG tablet Take 2 tablets (40 mg total) by mouth daily with breakfast.  . sertraline (ZOLOFT) 50 MG tablet 75 mg.   . VYVANSE 20 MG capsule Take 20 mg by mouth every morning.  . [DISCONTINUED] benzonatate (TESSALON) 100 MG capsule Take 1 capsule (100 mg total) by mouth 3 (three) times daily as needed for cough.  . [DISCONTINUED] benzonatate (TESSALON) 100 MG capsule Take 1 capsule (100 mg total) by mouth 3 (three) times daily as needed for cough.  . [DISCONTINUED] benzonatate (TESSALON) 100 MG capsule Take 1 capsule (100 mg total) by mouth 3 (three) times daily as needed for cough.  . [DISCONTINUED] benzonatate (TESSALON) 100 MG capsule Take 1 capsule (100 mg total) by mouth 3 (three) times daily as needed for cough.  . [DISCONTINUED] Drospirenone (SLYND) 4 MG TABS Take 1 tablet by mouth daily.  . [DISCONTINUED] predniSONE (DELTASONE) 20 MG tablet Take 2 tablets (40 mg total) by mouth daily with breakfast.  . [DISCONTINUED] predniSONE (DELTASONE) 20 MG tablet Take 2 tablets (40 mg total) by mouth daily with breakfast.   No facility-administered encounter medications on file as of 12/06/2019.   Patient Active Problem List   Diagnosis Date Noted  . COVID-19 12/03/2019  . Annual physical exam 09/11/2019  . Weakness 08/30/2019  . Mixed obsessional thoughts and acts 09/01/2018  . Fatigue  09/21/2017  . Weight gain 09/21/2017  . Numbness of toes 09/21/2017  . Bilateral low back pain without sciatica 09/21/2017  . Arthralgia 09/21/2017  . Vitamin D deficiency 05/13/2016  . B12 deficiency 05/13/2016  . Mild mitral and aortic regurgitation   . Panic disorder   . Depression, major, recurrent, moderate (HCC) 12/04/2014   Past Medical History:  Diagnosis Date  . ADHD (attention deficit hyperactivity disorder)   . Anxiety   . Asthma    childhood  . Depression   . Mild mitral  and aortic regurgitation    a. 04/2013 Echo: EF 50-55%, mild AI, mild MR, no MVP.  Marland Kitchen Mitral valve prolapse   . Palpitations    a. 2012 Holter: sinus tach, pvc's.  . Panic disorder    Relevant past medical, surgical, family and social history reviewed and updated as indicated. Interim medical history since our last visit reviewed.  Review of Systems  Constitutional: Positive for fatigue and fever. Negative for activity change and appetite change.  HENT: Positive for congestion and sneezing. Negative for ear discharge, ear pain, postnasal drip, rhinorrhea, sinus pressure, sinus pain and sore throat.   Eyes: Negative.  Negative for pain, discharge, redness and itching.  Respiratory: Positive for cough and chest tightness. Negative for shortness of breath.   Cardiovascular: Negative for chest pain.  Gastrointestinal: Negative.  Negative for nausea and vomiting.  Skin: Negative.  Negative for rash.  Neurological: Negative.  Negative for dizziness, light-headedness and headaches.  Hematological: Negative.  Negative for adenopathy.  Psychiatric/Behavioral: Negative for confusion, decreased concentration and sleep disturbance. The patient is nervous/anxious.     Per HPI unless specifically indicated above     Objective:    Pulse 98   Temp 98.9 F (37.2 C)   SpO2 97%   Wt Readings from Last 3 Encounters:  09/10/19 165 lb 9.6 oz (75.1 kg)  08/30/19 160 lb (72.6 kg)  03/12/19 159 lb (72.1 kg)    Physical Exam Vitals and nursing note reviewed.  Constitutional:      General: She is not in acute distress.    Appearance: She is well-developed. She is not toxic-appearing.  HENT:     Head: Normocephalic and atraumatic.     Right Ear: External ear normal.     Left Ear: External ear normal.     Mouth/Throat:     Mouth: Mucous membranes are moist.     Pharynx: Oropharynx is clear.  Eyes:     General: No scleral icterus.    Extraocular Movements: Extraocular movements intact.   Cardiovascular:     Rate and Rhythm: Normal rate.     Comments: Unable to assess heart sounds via virtual visit. Pulmonary:     Effort: Pulmonary effort is normal. No respiratory distress.     Comments: Unable to assess lung sounds via virtual visit Musculoskeletal:     Cervical back: Normal range of motion. No rigidity.  Skin:    Coloration: Skin is not jaundiced or pale.  Neurological:     General: No focal deficit present.     Mental Status: She is alert and oriented to person, place, and time. Mental status is at baseline.     Motor: No weakness.  Psychiatric:        Mood and Affect: Mood normal.        Behavior: Behavior normal.        Thought Content: Thought content normal.        Judgment: Judgment normal.  Assessment & Plan:   Problem List Items Addressed This Visit      Other   COVID-19 - Primary    Acute, ongoing.  Continue supportive therapy with plenty of hydration, tylenol cold/sinus, and rest.  Given new chest tightness, will start steroid burst, advised to take in the morning with breakfast.  Also start tessalon up to three times daily as needed for dry cough.  With any sudden onset of chest pain or shortness of breath, go to ED.      Relevant Medications   predniSONE (DELTASONE) 20 MG tablet   benzonatate (TESSALON) 100 MG capsule       Follow up plan: Return if symptoms worsen or fail to improve.  Due to the catastrophic nature of the COVID-19 pandemic, this visit was completed via audio and visual contact via Mychart due to the restrictions of the COVID-19 pandemic. All issues as above were discussed and addressed. Physical exam was done as above through visual confirmation on Mychart. If it was felt that the patient should be evaluated in the office, they were directed there. The patient verbally consented to this visit."} . Location of the patient: home . Location of the provider: work . Those involved with this call:  . Provider: Mardene Celeste,  DNP . CMA: Tiffany Reel, CMA . Front Desk/Registration: Adela Ports  . Time spent on call: 12 minutes on the phone discussing health concerns. 20 minutes total spent in review of patient's record and preparation of their chart.  I verified patient identity using two factors (patient name and date of birth). Patient consents verbally to being seen via telemedicine visit today.

## 2019-12-06 NOTE — Telephone Encounter (Signed)
Please reach out to patient to make an appointment.

## 2019-12-06 NOTE — Patient Instructions (Signed)
Benzonatate capsules What is this medicine? BENZONATATE (ben ZOE na tate) is used to treat cough. This medicine may be used for other purposes; ask your health care provider or pharmacist if you have questions. COMMON BRAND NAME(S): Tessalon Perles, Zonatuss What should I tell my health care provider before I take this medicine? They need to know if you have any of these conditions:  kidney or liver disease  an unusual or allergic reaction to benzonatate, anesthetics, other medicines, foods, dyes, or preservatives  pregnant or trying to get pregnant  breast-feeding How should I use this medicine? Take this medicine by mouth with a glass of water. Follow the directions on the prescription label. Avoid breaking, chewing, or sucking the capsule, as this can cause serious side effects. Take your medicine at regular intervals. Do not take your medicine more often than directed. Talk to your pediatrician regarding the use of this medicine in children. While this drug may be prescribed for children as young as 10 years old for selected conditions, precautions do apply. Overdosage: If you think you have taken too much of this medicine contact a poison control center or emergency room at once. NOTE: This medicine is only for you. Do not share this medicine with others. What if I miss a dose? If you miss a dose, take it as soon as you can. If it is almost time for your next dose, take only that dose. Do not take double or extra doses. What may interact with this medicine? Do not take this medicine with any of the following medications:  MAOIs like Carbex, Eldepryl, Marplan, Nardil, and Parnate This list may not describe all possible interactions. Give your health care provider a list of all the medicines, herbs, non-prescription drugs, or dietary supplements you use. Also tell them if you smoke, drink alcohol, or use illegal drugs. Some items may interact with your medicine. What should I watch for  while using this medicine? Tell your doctor if your symptoms do not improve or if they get worse. If you have a high fever, skin rash, or headache, see your health care professional. You may get drowsy or dizzy. Do not drive, use machinery, or do anything that needs mental alertness until you know how this medicine affects you. Do not sit or stand up quickly, especially if you are an older patient. This reduces the risk of dizzy or fainting spells. What side effects may I notice from receiving this medicine? Side effects that you should report to your doctor or health care professional as soon as possible:  allergic reactions like skin rash, itching or hives, swelling of the face, lips, or tongue  breathing problems  chest pain  confusion or hallucinations  irregular heartbeat  numbness of mouth or throat  seizures Side effects that usually do not require medical attention (report to your doctor or health care professional if they continue or are bothersome):  burning feeling in the eyes  constipation  headache  nasal congestion  stomach upset This list may not describe all possible side effects. Call your doctor for medical advice about side effects. You may report side effects to FDA at 1-800-FDA-1088. Where should I keep my medicine? Keep out of the reach of children. Store at room temperature between 15 and 30 degrees C (59 and 86 degrees F). Keep tightly closed. Protect from light and moisture. Throw away any unused medicine after the expiration date. NOTE: This sheet is a summary. It may not cover all possible information.   If you have questions about this medicine, talk to your doctor, pharmacist, or health care provider.  2020 Elsevier/Gold Standard (2007-10-25 14:52:56)  

## 2019-12-07 MED ORDER — BENZONATATE 100 MG PO CAPS: 100.0000 mg | ORAL_CAPSULE | Freq: Three times a day (TID) | ORAL | 0 refills | Status: DC | PRN

## 2019-12-07 NOTE — Assessment & Plan Note (Signed)
Acute, ongoing.  Continue supportive therapy with plenty of hydration, tylenol cold/sinus, and rest.  Given new chest tightness, will start steroid burst, advised to take in the morning with breakfast.  Also start tessalon up to three times daily as needed for dry cough.  With any sudden onset of chest pain or shortness of breath, go to ED.

## 2019-12-10 ENCOUNTER — Telehealth: Payer: Self-pay | Admitting: Nurse Practitioner

## 2019-12-10 ENCOUNTER — Ambulatory Visit (INDEPENDENT_AMBULATORY_CARE_PROVIDER_SITE_OTHER): Payer: No Typology Code available for payment source | Admitting: Nurse Practitioner

## 2019-12-10 ENCOUNTER — Other Ambulatory Visit: Payer: Self-pay

## 2019-12-10 ENCOUNTER — Encounter: Payer: Self-pay | Admitting: Nurse Practitioner

## 2019-12-10 VITALS — HR 85 | Temp 98.9°F

## 2019-12-10 DIAGNOSIS — U071 COVID-19: Secondary | ICD-10-CM | POA: Diagnosis not present

## 2019-12-10 DIAGNOSIS — R300 Dysuria: Secondary | ICD-10-CM | POA: Insufficient documentation

## 2019-12-10 MED ORDER — NITROFURANTOIN MONOHYD MACRO 100 MG PO CAPS: 100.0000 mg | ORAL_CAPSULE | Freq: Two times a day (BID) | ORAL | 0 refills | Status: DC

## 2019-12-10 MED ORDER — AMOXICILLIN-POT CLAVULANATE 875-125 MG PO TABS: 1.0000 | ORAL_TABLET | Freq: Two times a day (BID) | ORAL | 0 refills | Status: AC

## 2019-12-10 MED ORDER — ALBUTEROL SULFATE HFA 108 (90 BASE) MCG/ACT IN AERS: 2.0000 | INHALATION_SPRAY | Freq: Four times a day (QID) | RESPIRATORY_TRACT | 1 refills | Status: DC | PRN

## 2019-12-10 NOTE — Telephone Encounter (Signed)
Called pt and let her know that letter has been sent via mychart pt verbalized understanding.

## 2019-12-10 NOTE — Patient Instructions (Signed)
COVID-19 COVID-19 is a respiratory infection that is caused by a virus called severe acute respiratory syndrome coronavirus 2 (SARS-CoV-2). The disease is also known as coronavirus disease or novel coronavirus. In some people, the virus may not cause any symptoms. In others, it may cause a serious infection. The infection can get worse quickly and can lead to complications, such as:  Pneumonia, or infection of the lungs.  Acute respiratory distress syndrome or ARDS. This is a condition in which fluid build-up in the lungs prevents the lungs from filling with air and passing oxygen into the blood.  Acute respiratory failure. This is a condition in which there is not enough oxygen passing from the lungs to the body or when carbon dioxide is not passing from the lungs out of the body.  Sepsis or septic shock. This is a serious bodily reaction to an infection.  Blood clotting problems.  Secondary infections due to bacteria or fungus.  Organ failure. This is when your body's organs stop working. The virus that causes COVID-19 is contagious. This means that it can spread from person to person through droplets from coughs and sneezes (respiratory secretions). What are the causes? This illness is caused by a virus. You may catch the virus by:  Breathing in droplets from an infected person. Droplets can be spread by a person breathing, speaking, singing, coughing, or sneezing.  Touching something, like a table or a doorknob, that was exposed to the virus (contaminated) and then touching your mouth, nose, or eyes. What increases the risk? Risk for infection You are more likely to be infected with this virus if you:  Are within 6 feet (2 meters) of a person with COVID-19.  Provide care for or live with a person who is infected with COVID-19.  Spend time in crowded indoor spaces or live in shared housing. Risk for serious illness You are more likely to become seriously ill from the virus if you:   Are 50 years of age or older. The higher your age, the more you are at risk for serious illness.  Live in a nursing home or long-term care facility.  Have cancer.  Have a long-term (chronic) disease such as: ? Chronic lung disease, including chronic obstructive pulmonary disease or asthma. ? A long-term disease that lowers your body's ability to fight infection (immunocompromised). ? Heart disease, including heart failure, a condition in which the arteries that lead to the heart become narrow or blocked (coronary artery disease), a disease which makes the heart muscle thick, weak, or stiff (cardiomyopathy). ? Diabetes. ? Chronic kidney disease. ? Sickle cell disease, a condition in which red blood cells have an abnormal "sickle" shape. ? Liver disease.  Are obese. What are the signs or symptoms? Symptoms of this condition can range from mild to severe. Symptoms may appear any time from 2 to 14 days after being exposed to the virus. They include:  A fever or chills.  A cough.  Difficulty breathing.  Headaches, body aches, or muscle aches.  Runny or stuffy (congested) nose.  A sore throat.  New loss of taste or smell. Some people may also have stomach problems, such as nausea, vomiting, or diarrhea. Other people may not have any symptoms of COVID-19. How is this diagnosed? This condition may be diagnosed based on:  Your signs and symptoms, especially if: ? You live in an area with a COVID-19 outbreak. ? You recently traveled to or from an area where the virus is common. ? You   provide care for or live with a person who was diagnosed with COVID-19. ? You were exposed to a person who was diagnosed with COVID-19.  A physical exam.  Lab tests, which may include: ? Taking a sample of fluid from the back of your nose and throat (nasopharyngeal fluid), your nose, or your throat using a swab. ? A sample of mucus from your lungs (sputum). ? Blood tests.  Imaging tests, which  may include, X-rays, CT scan, or ultrasound. How is this treated? At present, there is no medicine to treat COVID-19. Medicines that treat other diseases are being used on a trial basis to see if they are effective against COVID-19. Your health care provider will talk with you about ways to treat your symptoms. For most people, the infection is mild and can be managed at home with rest, fluids, and over-the-counter medicines. Treatment for a serious infection usually takes places in a hospital intensive care unit (ICU). It may include one or more of the following treatments. These treatments are given until your symptoms improve.  Receiving fluids and medicines through an IV.  Supplemental oxygen. Extra oxygen is given through a tube in the nose, a face mask, or a hood.  Positioning you to lie on your stomach (prone position). This makes it easier for oxygen to get into the lungs.  Continuous positive airway pressure (CPAP) or bi-level positive airway pressure (BPAP) machine. This treatment uses mild air pressure to keep the airways open. A tube that is connected to a motor delivers oxygen to the body.  Ventilator. This treatment moves air into and out of the lungs by using a tube that is placed in your windpipe.  Tracheostomy. This is a procedure to create a hole in the neck so that a breathing tube can be inserted.  Extracorporeal membrane oxygenation (ECMO). This procedure gives the lungs a chance to recover by taking over the functions of the heart and lungs. It supplies oxygen to the body and removes carbon dioxide. Follow these instructions at home: Lifestyle  If you are sick, stay home except to get medical care. Your health care provider will tell you how long to stay home. Call your health care provider before you go for medical care.  Rest at home as told by your health care provider.  Do not use any products that contain nicotine or tobacco, such as cigarettes, e-cigarettes, and  chewing tobacco. If you need help quitting, ask your health care provider.  Return to your normal activities as told by your health care provider. Ask your health care provider what activities are safe for you. General instructions  Take over-the-counter and prescription medicines only as told by your health care provider.  Drink enough fluid to keep your urine pale yellow.  Keep all follow-up visits as told by your health care provider. This is important. How is this prevented?  There is no vaccine to help prevent COVID-19 infection. However, there are steps you can take to protect yourself and others from this virus. To protect yourself:   Do not travel to areas where COVID-19 is a risk. The areas where COVID-19 is reported change often. To identify high-risk areas and travel restrictions, check the CDC travel website: wwwnc.cdc.gov/travel/notices  If you live in, or must travel to, an area where COVID-19 is a risk, take precautions to avoid infection. ? Stay away from people who are sick. ? Wash your hands often with soap and water for 20 seconds. If soap and water   are not available, use an alcohol-based hand sanitizer. ? Avoid touching your mouth, face, eyes, or nose. ? Avoid going out in public, follow guidance from your state and local health authorities. ? If you must go out in public, wear a cloth face covering or face mask. Make sure your mask covers your nose and mouth. ? Avoid crowded indoor spaces. Stay at least 6 feet (2 meters) away from others. ? Disinfect objects and surfaces that are frequently touched every day. This may include:  Counters and tables.  Doorknobs and light switches.  Sinks and faucets.  Electronics, such as phones, remote controls, keyboards, computers, and tablets. To protect others: If you have symptoms of COVID-19, take steps to prevent the virus from spreading to others.  If you think you have a COVID-19 infection, contact your health care  provider right away. Tell your health care team that you think you may have a COVID-19 infection.  Stay home. Leave your house only to seek medical care. Do not use public transport.  Do not travel while you are sick.  Wash your hands often with soap and water for 20 seconds. If soap and water are not available, use alcohol-based hand sanitizer.  Stay away from other members of your household. Let healthy household members care for children and pets, if possible. If you have to care for children or pets, wash your hands often and wear a mask. If possible, stay in your own room, separate from others. Use a different bathroom.  Make sure that all people in your household wash their hands well and often.  Cough or sneeze into a tissue or your sleeve or elbow. Do not cough or sneeze into your hand or into the air.  Wear a cloth face covering or face mask. Make sure your mask covers your nose and mouth. Where to find more information  Centers for Disease Control and Prevention: www.cdc.gov/coronavirus/2019-ncov/index.html  World Health Organization: www.who.int/health-topics/coronavirus Contact a health care provider if:  You live in or have traveled to an area where COVID-19 is a risk and you have symptoms of the infection.  You have had contact with someone who has COVID-19 and you have symptoms of the infection. Get help right away if:  You have trouble breathing.  You have pain or pressure in your chest.  You have confusion.  You have bluish lips and fingernails.  You have difficulty waking from sleep.  You have symptoms that get worse. These symptoms may represent a serious problem that is an emergency. Do not wait to see if the symptoms will go away. Get medical help right away. Call your local emergency services (911 in the [place] Do not drive yourself to the hospital. Let the emergency medical personnel know if you think you have COVID-19. Summary  COVID-19 is a  respiratory infection that is caused by a virus. It is also known as coronavirus disease or novel coronavirus. It can cause serious infections, such as pneumonia, acute respiratory distress syndrome, acute respiratory failure, or sepsis.  The virus that causes COVID-19 is contagious. This means that it can spread from person to person through droplets from breathing, speaking, singing, coughing, or sneezing.  You are more likely to develop a serious illness if you are 50 years of age or older, have a weak immune system, live in a nursing home, or have chronic disease.  There is no medicine to treat COVID-19. Your health care provider will talk with you about ways to treat your symptoms.    Take steps to protect yourself and others from infection. Wash your hands often and disinfect objects and surfaces that are frequently touched every day. Stay away from people who are sick and wear a mask if you are sick. This information is not intended to replace advice given to you by your health care provider. Make sure you discuss any questions you have with your health care provider. Document Revised: 05/25/2019 Document Reviewed: 08/31/2018 Elsevier Patient Education  2020 [employer]  

## 2019-12-10 NOTE — Assessment & Plan Note (Addendum)
Ongoing symptoms (fatigue and occasional SOB), but some improvement.  Discussed at length with patient Covid 19 virus and effects of virus, with possibility of fatigue taking a length of time to dissipate.  Recommend she continue Tessalon at home and will send in Albuterol inhaler for her to use as needed.  Educated her on this medication.  Discussed that abx are not recommended for Covid treatment.  Continue current home regimen and ensure plenty of rest and fluids daily.  Will write work note and have her follow-up in office in one week.  She is to immediately go to ER if any worsening SOB or symptoms present.

## 2019-12-10 NOTE — Telephone Encounter (Signed)
Just sent it via her MyChart

## 2019-12-10 NOTE — Telephone Encounter (Signed)
Please reach out to make virtual appt for UTI symptoms if patient desires appt with [place] for that.

## 2019-12-10 NOTE — Assessment & Plan Note (Signed)
Acute x 2 days with frequency.  Unable to obtain urine as patient on quarantine due to Covid.  Will treat with Augmentin prophylactic at this time due to her current symptoms, discussed with her that due to being able to obtain urine it is not 100% this will be effective and she reports understanding.  Recommend continue to increase fluid intake.  If any worsening symptoms immediately go to UC or ER for further evaluation.  She was able to verbalize this plan back to provider.

## 2019-12-10 NOTE — Telephone Encounter (Signed)
Pt has already called and scheduled appt to speak with Jolene virtually.

## 2019-12-10 NOTE — Progress Notes (Signed)
Pulse 85   Temp 98.9 F (37.2 C) (Oral)   SpO2 98%    Subjective:    Patient ID: Katelyn Whitehead, female    DOB: 1991/06/13, 29 y.o.   MRN: [id number]  HPI: Katelyn Whitehead is a 29 y.o. female  Chief Complaint  Patient presents with  . Covid Exposure    pt states today is supposed to be here last day of quarantine. States she is still not feeling great. States she is still very fatigued and has a lot of congestion  . Urinary Tract Infection    pt states she has been having urinary urgency and pain when urinating for the last few days    . This visit was completed via telephone due to the restrictions of the COVID-19 pandemic. All issues as above were discussed and addressed but no physical exam was performed. If it was felt that the patient should be evaluated in the office, they were directed there. The patient verbally consented to this visit. Patient was unable to complete an audio/visual visit due to Lack of equipment. Due to the catastrophic nature of the COVID-19 pandemic, this visit was done through audio contact only. . Location of the patient: home . Location of the provider: work . Those involved with this call:  . Provider: Aura Dials, DNP . CMA: Wilhemena Durie, CMA . Front Desk/Registration: Adela Ports  . Time spent on call: 15 minutes on the phone discussing health concerns. 10 minutes total spent in review of patient's record and preparation of their chart.  . I verified patient identity using two factors (patient name and date of birth). Patient consents verbally to being seen via telemedicine visit today.    COVID POSITIVE Today is last day of her quarantine and reports she still does not feel 100%. Reports ongoing exhaustion, has to lay down for 20 minutes and then can go back to what she was doing.  Reports having some palpitations and SOB with activity along with her fatigue, but no chest pain.  She tested positive 12/01/2019.  She has a 31 year old  at home and can not rest.    Taking Tylenol cold and sinus.  Was given Prednisone and Tessalon at previous visit. Fever: no, none today, has had them on and off Cough: yes Shortness of breath: yes Wheezing: no Chest pain: no Chest tightness: no Chest congestion: yes Nasal congestion: no Runny nose: no Post nasal drip: yes Sneezing: no Sore throat: no Swollen glands: no Sinus pressure: no Headache: yes Face pain: no Toothache: no Ear pain: none Ear pressure: none Eyes red/itching:no Eye drainage/crusting: no  Vomiting: no Rash: no Fatigue: yes Context: stable Recurrent sinusitis: no Relief with OTC cold/cough medications: yes  Treatments attempted: Tessalon and cough syrup   URINARY SYMPTOMS Reports she has been having burning and frequency.  Symptoms started on Saturday.   Denies currently being pregnant.   Dysuria: burning Urinary frequency: yes Urgency: no Small volume voids: no Symptom severity: no Urinary incontinence: no Foul odor: no Hematuria: no Abdominal pain: no Back pain: no Suprapubic pain/pressure: yes Flank pain: no Fever:  no Vomiting: no Status: stable Previous urinary tract infection: yes Recurrent urinary tract infection: no Sexual activity: mongamous Treatments attempted: increasing fluids   Relevant past medical, surgical, family and social history reviewed and updated as indicated. Interim medical history since our last visit reviewed. Allergies and medications reviewed and updated.  Review of Systems  Constitutional: Positive for fatigue. Negative for activity  change, appetite change, diaphoresis and fever.  HENT: Positive for postnasal drip. Negative for congestion, ear discharge, ear pain, rhinorrhea, sinus pressure, sinus pain, sneezing and sore throat.   Respiratory: Positive for cough and shortness of breath. Negative for chest tightness and wheezing.   Cardiovascular: Negative for chest pain, palpitations and leg swelling.    Gastrointestinal: Negative.   Genitourinary: Positive for dysuria and frequency. Negative for flank pain, hematuria, urgency and vaginal discharge.  Neurological: Negative.   Psychiatric/Behavioral: Negative.     Per HPI unless specifically indicated above     Objective:    Pulse 85   Temp 98.9 F (37.2 C) (Oral)   SpO2 98%   Wt Readings from Last 3 Encounters:  09/10/19 165 lb 9.6 oz (75.1 kg)  08/30/19 160 lb (72.6 kg)  03/12/19 159 lb (72.1 kg)    Physical Exam   Unable to perform due to telephone visit only.  Results for orders placed or performed during the hospital encounter of 09/13/19  Comprehensive metabolic panel  Result Value Ref Range   Sodium 137 135 - 145 mmol/L   Potassium 3.7 3.5 - 5.1 mmol/L   Chloride 104 98 - 111 mmol/L   CO2 25 22 - 32 mmol/L   Glucose, Bld 113 (H) 70 - 99 mg/dL   BUN 15 6 - 20 mg/dL   Creatinine, Ser 0.83 0.44 - 1.00 mg/dL   Calcium 9.1 8.9 - 10.3 mg/dL   Total Protein 7.5 6.5 - 8.1 g/dL   Albumin 3.8 3.5 - 5.0 g/dL   AST 20 15 - 41 U/L   ALT 21 0 - 44 U/L   Alkaline Phosphatase 77 38 - 126 U/L   Total Bilirubin 0.4 0.3 - 1.2 mg/dL   GFR calc non Af Amer >60 >60 mL/min   GFR calc Af Amer >60 >60 mL/min   Anion gap 8 5 - 15  VITAMIN D 25 Hydroxy (Vit-D Deficiency, Fractures)  Result Value Ref Range   Vit D, 25-Hydroxy 39.03 30 - 100 ng/mL  Vitamin B12  Result Value Ref Range   Vitamin B-12 255 180 - 914 pg/mL  TSH  Result Value Ref Range   TSH 1.543 0.350 - 4.500 uIU/mL  CBC with Differential/Platelet  Result Value Ref Range   WBC 7.3 4.0 - 10.5 K/uL   RBC 5.06 3.87 - 5.11 MIL/uL   Hemoglobin 13.5 12.0 - 15.0 g/dL   HCT 41.8 36.0 - 46.0 %   MCV 82.6 80.0 - 100.0 fL   MCH 26.7 26.0 - 34.0 pg   MCHC 32.3 30.0 - 36.0 g/dL   RDW 13.4 11.5 - 15.5 %   Platelets 345 150 - 400 K/uL   nRBC 0.0 0.0 - 0.2 %   Neutrophils Relative % 57 %   Neutro Abs 4.2 1.7 - 7.7 K/uL   Lymphocytes Relative 25 %   Lymphs Abs 1.9 0.7 - 4.0  K/uL   Monocytes Relative 11 %   Monocytes Absolute 0.8 0.1 - 1.0 K/uL   Eosinophils Relative 6 %   Eosinophils Absolute 0.4 0.0 - 0.5 K/uL   Basophils Relative 1 %   Basophils Absolute 0.1 0.0 - 0.1 K/uL   Immature Granulocytes 0 %   Abs Immature Granulocytes 0.03 0.00 - 0.07 K/uL  Hemoglobin A1c  Result Value Ref Range   Hgb A1c MFr Bld 5.5 4.8 - 5.6 %   Mean Plasma Glucose 111.15 mg/dL      Assessment & Plan:   Problem  List Items Addressed This Visit      Other   COVID-19 - Primary    Ongoing symptoms (fatigue and occasional SOB), but some improvement.  Discussed at length with patient Covid 19 virus and effects of virus, with possibility of fatigue taking a length of time to dissipate.  Recommend she continue Tessalon at home and will send in Albuterol inhaler for her to use as needed.  Educated her on this medication.  Discussed that abx are not recommended for Covid treatment.  Continue current home regimen and ensure plenty of rest and fluids daily.  Will write work note and have her follow-up in office in one week.  She is to immediately go to ER if any worsening SOB or symptoms present.      Dysuria    Acute x 2 days with frequency.  Unable to obtain urine as patient on quarantine due to Covid.  Will treat with Augmentin prophylactic at this time due to her current symptoms, discussed with her that due to being able to obtain urine it is not 100% this will be effective and she reports understanding.  Recommend continue to increase fluid intake.  If any worsening symptoms immediately go to UC or ER for further evaluation.  She was able to verbalize this plan back to provider.         I discussed the assessment and treatment plan with the patient. The patient was provided an opportunity to ask questions and all were answered. The patient agreed with the plan and demonstrated an understanding of the instructions.   The patient was advised to call back or seek an in-person  evaluation if the symptoms worsen or if the condition fails to improve as anticipated.   I provided 15+ minutes of time during this encounter.  Follow up plan: Return in about 1 week (around 12/17/2019) for Covid follow-up.

## 2019-12-10 NOTE — Telephone Encounter (Signed)
Copied from CRM [id number]. Topic: General - Other >> Dec 10, 2019  3:08 PM Marylen Ponto wrote: Reason for CRM: Pt stated she needs a doctor's note for her visit today along with date she can return. Pt requests call back when note is ready.

## 2019-12-11 ENCOUNTER — Other Ambulatory Visit: Payer: Self-pay

## 2019-12-11 ENCOUNTER — Ambulatory Visit
Admission: RE | Admit: 2019-12-11 | Discharge: 2019-12-11 | Disposition: A | Discharge status: Home / Self Care | Payer: No Typology Code available for payment source | Source: Ambulatory Visit | Attending: Nurse Practitioner | Admitting: Nurse Practitioner

## 2019-12-11 DIAGNOSIS — U071 COVID-19: Secondary | ICD-10-CM

## 2019-12-11 NOTE — Telephone Encounter (Signed)
Patient notified

## 2019-12-11 NOTE — Telephone Encounter (Signed)
Pt called in and stated that she would like to have a xray ordered. Please advise.

## 2019-12-11 NOTE — Addendum Note (Signed)
Addended by: Aura Dials T on: 12/11/2019 01:37 PM   Modules accepted: Orders

## 2019-12-11 NOTE — Telephone Encounter (Signed)
Have ordered since I saw her yesterday, she can go to [place] location to obtain

## 2019-12-11 NOTE — Telephone Encounter (Signed)
Can you order this?

## 2019-12-12 NOTE — Progress Notes (Signed)
Contacted via MyChart

## 2019-12-17 ENCOUNTER — Encounter: Payer: Self-pay | Admitting: Nurse Practitioner

## 2019-12-17 ENCOUNTER — Telehealth (INDEPENDENT_AMBULATORY_CARE_PROVIDER_SITE_OTHER): Payer: No Typology Code available for payment source | Admitting: Nurse Practitioner

## 2019-12-17 ENCOUNTER — Other Ambulatory Visit: Payer: Self-pay

## 2019-12-17 VITALS — HR 83 | Ht 62.0 in | Wt 163.0 lb

## 2019-12-17 DIAGNOSIS — U071 COVID-19: Secondary | ICD-10-CM | POA: Diagnosis not present

## 2019-12-17 DIAGNOSIS — R21 Rash and other nonspecific skin eruption: Secondary | ICD-10-CM

## 2019-12-17 NOTE — Assessment & Plan Note (Signed)
Symptoms much improved, no longer with shortness of breath or congestion.  Still with fatigue and headache and discussed potential prolonged length of these symptoms.  Will write return to work note for employer.

## 2019-12-17 NOTE — Progress Notes (Signed)
Pulse 83   Ht 5\' 2"  (1.575 m)   Wt 163 lb (73.9 kg)   SpO2 99%   BMI 29.81 kg/m    Subjective:    Patient ID: , female    DOB: 1991/05/03, 29 y.o.   MRN: [id number]  HPI: Katelyn Whitehead is a 29 y.o. female presenting for COVID-19 follow up.  Chief Complaint  Patient presents with  . COVID-19    Patient is doing better.    UPPER RESPIRATORY TRACT INFECTION Patient states she is feeling a lot better.  Reports her chest is clear. Worst symptom: headache, fatigue Fever: no Cough: no Shortness of breath: no Wheezing: no Chest pain: no Chest tightness: no Chest congestion: no Nasal congestion: no Runny nose: no Post nasal drip: no Sneezing: no Sore throat: no Swollen glands: no Sinus pressure: no Headache: yes; takes Tylenol cold/sinus Face pain: no Toothache: no Ear pain: no  Ear pressure: no  Eyes red/itching:no Eye drainage/crusting: no  Nausea/Vomiting: no Rash: yes; on arm Fatigue: yes Sick contacts: no Strep contacts: no  Context: stable Recurrent sinusitis: no Relief with OTC cold/cough medications: no  Treatments attempted: Tylenol cold/sinus  RASH Duration:  days  Location: arms  Itching: yes Burning: no Redness: no Oozing: yes Scaling: no Blisters: no Painful: no Fevers: no Change in detergents/soaps/personal care products: no Recent illness: no Recent travel:no History of same: yes Context: stable Alleviating factors: hydrocortisone cream Treatments attempted:hydrocortisone cream Shortness of breath: no  Throat/tongue swelling: no Myalgias/arthralgias: no   Allergies  Allergen Reactions  . Influenza Vaccines Hives  . Sulfa Antibiotics     Hives Rash   Outpatient Encounter Medications as of 12/17/2019  Medication Sig  . acetaminophen (TYLENOL) 500 MG tablet Take 500 mg by mouth every 6 (six) hours as needed.  02/16/2020 albuterol (VENTOLIN HFA) 108 (90 Base) MCG/ACT inhaler Inhale 2 puffs into the lungs every 6 (six)  hours as needed for wheezing or shortness of breath.  Marland Kitchen amoxicillin-clavulanate (AUGMENTIN) 875-125 MG tablet Take 1 tablet by mouth 2 (two) times daily for 7 days.  . cetirizine (ZYRTEC) 10 MG tablet Take 1 tablet (10 mg total) by mouth daily.  Marland Kitchen etonogestrel-ethinyl estradiol (NUVARING) 0.12-0.015 MG/24HR vaginal ring Insert vaginally and leave in place for 3 consecutive weeks, then remove for 1 week.  09-04-1975 LORazepam (ATIVAN) 0.5 MG tablet Take by mouth.  . sertraline (ZOLOFT) 50 MG tablet 75 mg.   . diphenhydrAMINE HCl (BENADRYL ALLERGY PO) Take by mouth.  Marland Kitchen VYVANSE 20 MG capsule Take 20 mg by mouth every morning.  . [DISCONTINUED] Azelastine-Fluticasone 137-50 MCG/ACT SUSP Place 1 spray into the nose 2 (two) times daily. (Patient not taking: Reported on 12/17/2019)  . [DISCONTINUED] benzonatate (TESSALON) 100 MG capsule Take 1 capsule (100 mg total) by mouth 3 (three) times daily as needed for cough. (Patient not taking: Reported on 12/17/2019)  . [DISCONTINUED] ondansetron (ZOFRAN ODT) 4 MG disintegrating tablet Take 1 tablet (4 mg total) by mouth every 8 (eight) hours as needed for nausea or vomiting. (Patient not taking: Reported on 09/10/2019)  . [DISCONTINUED] predniSONE (DELTASONE) 20 MG tablet Take 2 tablets (40 mg total) by mouth daily with breakfast. (Patient not taking: Reported on 12/17/2019)   No facility-administered encounter medications on file as of 12/17/2019.   Patient Active Problem List   Diagnosis Date Noted  . Dysuria 12/10/2019  . COVID-19 12/03/2019  . Annual physical exam 09/11/2019  . Weakness 08/30/2019  . Mixed obsessional thoughts and  acts 09/01/2018  . Fatigue 09/21/2017  . Weight gain 09/21/2017  . Numbness of toes 09/21/2017  . Bilateral low back pain without sciatica 09/21/2017  . Arthralgia 09/21/2017  . Vitamin D deficiency 05/13/2016  . B12 deficiency 05/13/2016  . Mild mitral and aortic regurgitation   . Panic disorder   . Depression, major, recurrent,  moderate ([facility]) 12/04/2014   Past Medical History:  Diagnosis Date  . ADHD (attention deficit hyperactivity disorder)   . Anxiety   . Asthma    childhood  . Depression   . Mild mitral and aortic regurgitation    a. 04/2013 Echo: EF 50-55%, mild AI, mild MR, no MVP.  Marland Kitchen Mitral valve prolapse   . Palpitations    a. 2012 Holter: sinus tach, pvc's.  . Panic disorder    Relevant past medical, surgical, family and social history reviewed and updated as indicated. Interim medical history since our last visit reviewed.  Review of Systems  Constitutional: Positive for fatigue. Negative for activity change, appetite change and fever.  HENT: Negative.  Negative for congestion, ear discharge, ear pain, postnasal drip, rhinorrhea, sinus pressure, sinus pain, sneezing and sore throat.   Eyes: Negative.  Negative for pain, discharge, redness, itching and visual disturbance.  Respiratory: Negative.  Negative for cough, chest tightness and shortness of breath.   Cardiovascular: Negative for chest pain.  Gastrointestinal: Negative.  Negative for nausea and vomiting.  Skin: Positive for rash. Negative for color change.  Neurological: Negative.  Negative for dizziness, light-headedness and headaches.  Hematological: Negative.  Negative for adenopathy.  Psychiatric/Behavioral: Negative.  Negative for confusion, decreased concentration and sleep disturbance. The patient is not nervous/anxious.    Per HPI unless specifically indicated above     Objective:    Pulse 83   Ht 5\' 2"  (1.575 m)   Wt 163 lb (73.9 kg)   SpO2 99%   BMI 29.81 kg/m   Wt Readings from Last 3 Encounters:  12/17/19 163 lb (73.9 kg)  09/10/19 165 lb 9.6 oz (75.1 kg)  08/30/19 160 lb (72.6 kg)    Physical Exam Vitals and nursing note reviewed.  Constitutional:      General: She is not in acute distress.    Appearance: She is well-developed. She is not toxic-appearing.  HENT:     Head: Normocephalic and atraumatic.      Right Ear: External ear normal.     Left Ear: External ear normal.     Mouth/Throat:     Mouth: Mucous membranes are moist.     Pharynx: Oropharynx is clear.  Eyes:     General: No scleral icterus.    Extraocular Movements: Extraocular movements intact.  Cardiovascular:     Comments: Unable to assess heart sounds via virtual visit. Pulmonary:     Effort: Pulmonary effort is normal. No respiratory distress.     Comments: Unable to assess lung sounds via virtual visit Musculoskeletal:     Cervical back: Normal range of motion. No rigidity.  Skin:    Coloration: Skin is not jaundiced or pale.  Neurological:     General: No focal deficit present.     Mental Status: She is alert and oriented to person, place, and time. Mental status is at baseline.     Motor: No weakness.  Psychiatric:        Mood and Affect: Mood normal.        Behavior: Behavior normal.        Thought Content: Thought content  normal.        Judgment: Judgment normal.       Assessment & Plan:   Problem List Items Addressed This Visit      Other   COVID-19 - Primary    Symptoms much improved, no longer with shortness of breath or congestion.  Still with fatigue and headache and discussed potential prolonged length of these symptoms.  Will write return to work note for employer.         Other Visit Diagnoses    Rash       Acute, ongoing.  Likely dermatitis from pulling weeds over the weekend.  Continue hydrocortisone use.  Return to [facility] if not better in 1-2 weeks.       Follow up plan: Return as scheduled.  Due to the catastrophic nature of the COVID-19 pandemic, this visit was completed via audio and visual contact via Mychart due to the restrictions of the COVID-19 pandemic. All issues as above were discussed and addressed. Physical exam was done as above through visual confirmation on Mychart. If it was felt that the patient should be evaluated in the office, they were directed there. The patient  verbally consented to this visit. . Location of the patient: home . Location of the provider: work . Those involved with this call:  . Provider: Mardene Celeste, DNP . CMA: Myrtha Mantis, CMA . Front Desk/Registration: Beverely Pace  . Time spent on call: 12 minutes on the phone discussing health concerns. 20 minutes total spent in review of patient's record and preparation of their chart.  I verified patient identity using two factors (patient name and date of birth). Patient consents verbally to being seen via telemedicine visit today.

## 2019-12-18 ENCOUNTER — Encounter: Payer: Self-pay | Admitting: Nurse Practitioner

## 2019-12-18 ENCOUNTER — Encounter: Payer: Self-pay | Admitting: Emergency Medicine

## 2019-12-18 ENCOUNTER — Other Ambulatory Visit: Payer: Self-pay

## 2019-12-18 ENCOUNTER — Ambulatory Visit
Admission: EM | Admit: 2019-12-18 | Discharge: 2019-12-18 | Disposition: A | Discharge status: Home / Self Care | Payer: No Typology Code available for payment source | Attending: Emergency Medicine | Admitting: Emergency Medicine

## 2019-12-18 DIAGNOSIS — B029 Zoster without complications: Secondary | ICD-10-CM

## 2019-12-18 MED ORDER — VALACYCLOVIR HCL 1 G PO TABS: 1000.0000 mg | ORAL_TABLET | Freq: Three times a day (TID) | ORAL | 0 refills | Status: DC

## 2019-12-18 NOTE — Telephone Encounter (Signed)
Yes please

## 2019-12-18 NOTE — ED Triage Notes (Signed)
Patient in today c/o rash on the left side of her face that she noticed this morning. Patient states her husband was diagnosed with shingles ~ 2 weeks ago. Patient has not taken any OTC medications.

## 2019-12-18 NOTE — ED Provider Notes (Signed)
Renaldo Fiddler    CSN: [id number] Arrival date & time: 12/18/19  0909      History   Chief Complaint Chief Complaint  Patient presents with  . Rash    HPI Katelyn Whitehead is a 29 y.o. female.   Patient presents with rash on the left side of her nose and left side of her face beside her ear since this morning.  Her husband had shingles 2 weeks ago.  Patient was COVID positive 3 weeks ago.  She denies fever, chills, sore throat, cough, shortness of breath, vomiting, diarrhea, or other symptoms.  No treatment attempted at home.  The history is provided by the patient.    Past Medical History:  Diagnosis Date  . ADHD (attention deficit hyperactivity disorder)   . Anxiety   . Asthma    childhood  . Depression   . Mild mitral and aortic regurgitation    a. 04/2013 Echo: EF 50-55%, mild AI, mild MR, no MVP.  Marland Kitchen Mitral valve prolapse   . Palpitations    a. 2012 Holter: sinus tach, pvc's.  . Panic disorder     Patient Active Problem List   Diagnosis Date Noted  . Dysuria 12/10/2019  . COVID-19 12/03/2019  . Annual physical exam 09/11/2019  . Weakness 08/30/2019  . Mixed obsessional thoughts and acts 09/01/2018  . Fatigue 09/21/2017  . Weight gain 09/21/2017  . Numbness of toes 09/21/2017  . Bilateral low back pain without sciatica 09/21/2017  . Arthralgia 09/21/2017  . Vitamin D deficiency 05/13/2016  . B12 deficiency 05/13/2016  . Mild mitral and aortic regurgitation   . Panic disorder   . Depression, major, recurrent, moderate ([facility]) 12/04/2014    Past Surgical History:  Procedure Laterality Date  . IUD REMOVAL N/A 11/22/2016   Procedure: LAPAROCOPIC INTRAUTERINE DEVICE (IUD) REMOVAL;  Surgeon: Linzie Collin, MD;  Location: [facility] ORS;  Service: Gynecology;  Laterality: N/A;  . WISDOM TOOTH EXTRACTION      OB History    Gravida  2   Para  1   Term  1   Preterm      AB  1   Living  1     SAB  1   TAB      Ectopic      Multiple  0   Live Births  1            Home Medications    Prior to Admission medications   Medication Sig Start Date End Date Taking? Authorizing Provider  acetaminophen (TYLENOL) 500 MG tablet Take 500 mg by mouth every 6 (six) hours as needed.   Yes [provider]  albuterol (VENTOLIN HFA) 108 (90 Base) MCG/ACT inhaler Inhale 2 puffs into the lungs every 6 (six) hours as needed for wheezing or shortness of breath. 12/10/19  Yes Cannady, Jolene T, NP  cetirizine (ZYRTEC) 10 MG tablet Take 1 tablet (10 mg total) by mouth daily. 03/12/19  Yes Janalyn Harder, PA-C  diphenhydrAMINE HCl (BENADRYL ALLERGY PO) Take by mouth.   Yes [provider]  etonogestrel-ethinyl estradiol (NUVARING) 0.12-0.015 MG/24HR vaginal ring Insert vaginally and leave in place for 3 consecutive weeks, then remove for 1 week. 09/10/19  Yes Mardene Celeste I, NP  LORazepam (ATIVAN) 0.5 MG tablet Take by mouth. 03/02/18  Yes [provider]  sertraline (ZOLOFT) 50 MG tablet 75 mg.  11/20/19  Yes [provider]  VYVANSE 20 MG capsule Take 20 mg by mouth every  morning. 11/20/19  Yes [provider]  valACYclovir (VALTREX) 1000 MG tablet Take 1 tablet (1,000 mg total) by mouth 3 (three) times daily. 12/18/19   Sharion Balloon, NP    Family History Family History  Problem Relation Age of Onset  . Depression Mother   . Diabetes Mother        type 2  . Hyperlipidemia Mother   . Hypertension Mother   . Melanoma Mother        skin  . Drug abuse Father   . Gout Father   . Depression Father   . Psychiatric Illness Father        PTSD  . ADD / ADHD Brother   . Depression Maternal Aunt   . Dementia Maternal Grandmother   . Depression Maternal Grandmother   . Heart disease Maternal Grandfather   . Diabetes Paternal Grandmother        type 2  . Hypertension Paternal Grandmother     Social History Social History   Tobacco Use  . Smoking status: Never Smoker  . Smokeless tobacco: Never  Used  Substance Use Topics  . Alcohol use: No    Alcohol/week: 0.0 standard drinks  . Drug use: No     Allergies   Influenza vaccines and Sulfa antibiotics   Review of Systems Review of Systems  Constitutional: Negative for chills and fever.  HENT: Negative for congestion, ear pain and sore throat.   Eyes: Negative for pain and visual disturbance.  Respiratory: Negative for cough and shortness of breath.   Cardiovascular: Negative for chest pain and palpitations.  Gastrointestinal: Negative for abdominal pain, diarrhea, nausea and vomiting.  Genitourinary: Negative for dysuria and hematuria.  Musculoskeletal: Negative for arthralgias and back pain.  Skin: Positive for rash. Negative for color change.  Neurological: Negative for seizures and syncope.  All other systems reviewed and are negative.    Physical Exam Triage Vital Signs ED Triage Vitals  Enc Vitals Group     BP 12/18/19 0912 110/75     Pulse Rate 12/18/19 0912 83     Resp 12/18/19 0912 18     Temp 12/18/19 0912 98.6 F (37 C)     Temp Source 12/18/19 0912 Oral     SpO2 12/18/19 0912 98 %     Weight 12/18/19 0914 163 lb (73.9 kg)     Height 12/18/19 0914 5\' 2"  (1.575 m)     Head Circumference --      Peak Flow --      Pain Score 12/18/19 0913 7     Pain Loc --      Pain Edu? --      Excl. in [place]? --    No data found.  Updated Vital Signs BP 110/75 (BP Location: Left Arm)   Pulse 83   Temp 98.6 F (37 C) (Oral)   Resp 18   Ht 5\' 2"  (1.575 m)   Wt 163 lb (73.9 kg)   LMP 12/08/2019 (Exact Date)   SpO2 98%   BMI 29.81 kg/m   Visual Acuity Right Eye Distance:   Left Eye Distance:   Bilateral Distance:    Right Eye Near:   Left Eye Near:    Bilateral Near:     Physical Exam Vitals and nursing note reviewed.  Constitutional:      General: She is not in acute distress.    Appearance: She is well-developed. She is not ill-appearing.  HENT:     Head:  Normocephalic and atraumatic.     Right  Ear: Tympanic membrane normal.     Left Ear: Tympanic membrane normal.     Nose: Nose normal.     Mouth/Throat:     Mouth: Mucous membranes are moist.     Pharynx: Oropharynx is clear.  Eyes:     Conjunctiva/sclera: Conjunctivae normal.  Cardiovascular:     Rate and Rhythm: Normal rate and regular rhythm.     Heart sounds: No murmur.  Pulmonary:     Effort: Pulmonary effort is normal. No respiratory distress.     Breath sounds: Normal breath sounds.  Abdominal:     Palpations: Abdomen is soft.     Tenderness: There is no abdominal tenderness. There is no guarding or rebound.  Musculoskeletal:     Cervical back: Neck supple.  Skin:    General: Skin is warm and dry.     Findings: Rash present.     Comments: Red vesicular patchy rash on left side of nose and left cheek in front of her ear.  Neurological:     General: No focal deficit present.     Mental Status: She is alert and oriented to person, place, and time.     Sensory: No sensory deficit.     Motor: No weakness.     Gait: Gait normal.  Psychiatric:        Mood and Affect: Mood normal.        Behavior: Behavior normal.      UC Treatments / Results  Labs (all labs ordered are listed, but only abnormal results are displayed) Labs Reviewed - No data to display  EKG   Radiology No results found.  Procedures Procedures (including critical care time)  Medications Ordered in UC Medications - No data to display  Initial Impression / Assessment and Plan / UC Course  I have reviewed the triage vital signs and the nursing notes.  Pertinent labs & imaging results that were available during my care of the patient were reviewed by me and considered in my medical decision making (see chart for details).   Herpes zoster.  Treating with Valtrex.  Patient reports her pain is well controlled with Tylenol or ibuprofen.  Instructed her to notify employee health today as she has been at work this morning.  Instructed her to  follow-up with her PCP as needed.  Patient agrees to plan of care.     Final Clinical Impressions(s) / UC Diagnoses   Final diagnoses:  Herpes zoster without complication     Discharge Instructions     Take the Valtrex as directed.    Follow up with Employee Health today.    Follow up with your PCP as needed.        ED Prescriptions    Medication Sig Dispense Auth. Provider   valACYclovir (VALTREX) 1000 MG tablet Take 1 tablet (1,000 mg total) by mouth 3 (three) times daily. 21 tablet Mickie Bail, NP     I have reviewed the PDMP during this encounter.   Mickie Bail, NP 12/18/19 [phone]

## 2019-12-18 NOTE — Discharge Instructions (Addendum)
Take the Valtrex as directed.    Follow up with Employee Health today.    Follow up with your PCP as needed.

## 2019-12-20 ENCOUNTER — Telehealth (INDEPENDENT_AMBULATORY_CARE_PROVIDER_SITE_OTHER): Payer: No Typology Code available for payment source | Admitting: Nurse Practitioner

## 2019-12-20 ENCOUNTER — Encounter: Payer: Self-pay | Admitting: Nurse Practitioner

## 2019-12-20 ENCOUNTER — Telehealth: Payer: Self-pay | Admitting: Nurse Practitioner

## 2019-12-20 ENCOUNTER — Telehealth: Payer: Self-pay

## 2019-12-20 ENCOUNTER — Ambulatory Visit: Payer: No Typology Code available for payment source | Admitting: Nurse Practitioner

## 2019-12-20 ENCOUNTER — Other Ambulatory Visit: Payer: Self-pay

## 2019-12-20 DIAGNOSIS — U071 COVID-19: Secondary | ICD-10-CM | POA: Diagnosis not present

## 2019-12-20 DIAGNOSIS — L247 Irritant contact dermatitis due to plants, except food: Secondary | ICD-10-CM | POA: Diagnosis not present

## 2019-12-20 DIAGNOSIS — B029 Zoster without complications: Secondary | ICD-10-CM | POA: Diagnosis not present

## 2019-12-20 MED ORDER — GABAPENTIN 100 MG PO CAPS: 100.0000 mg | ORAL_CAPSULE | Freq: Three times a day (TID) | ORAL | 0 refills | Status: DC | PRN

## 2019-12-20 MED ORDER — PREDNISONE 10 MG PO TABS: ORAL_TABLET | ORAL | 0 refills | Status: AC

## 2019-12-20 MED ORDER — PREDNISONE 10 MG PO TABS: ORAL_TABLET | ORAL | 0 refills | Status: DC

## 2019-12-20 NOTE — Assessment & Plan Note (Addendum)
Symptoms much improved today.  Has been out of work since 12/01/2019 due to positive COVID-19 test and has been unable to return due to new shingles that began 12/17/2019.  Will fill out FMLA paperwork and short term disability for this amount of time.

## 2019-12-20 NOTE — Telephone Encounter (Signed)
Pharmacy sent a fax requesting clarification on Prednisone RX. It has 2 different directions. Please review and resend with correct directions.

## 2019-12-20 NOTE — Telephone Encounter (Signed)
lvm to schedule f.u for 12/28/2019

## 2019-12-20 NOTE — Telephone Encounter (Signed)
-----   Message from Wells Guiles, NP sent at 12/20/2019 10:28 AM EDT ----- Please reach out to patient to schedule a virtual follow up for next Friday 12/28/2019

## 2019-12-20 NOTE — Patient Instructions (Signed)
Shingles  Shingles, which is also known as herpes zoster, is an infection that causes a painful skin rash and fluid-filled blisters. It is caused by a virus. Shingles only develops in people who:  Have had chickenpox.  Have been given a medicine to protect against chickenpox (have been vaccinated). Shingles is rare in this group. What are the causes? Shingles is caused by varicella-zoster virus (VZV). This is the same virus that causes chickenpox. After a person is exposed to VZV, the virus stays in the body in an inactive (dormant) state. Shingles develops if the virus is reactivated. This can happen many years after the first (initial) exposure to VZV. It is not known what causes this virus to be reactivated. What increases the risk? People who have had chickenpox or received the chickenpox vaccine are at risk for shingles. Shingles infection is more common in people who:  Are older than age 60.  Have a weakened disease-fighting system (immune system), such as people with: ? HIV. ? AIDS. ? Cancer.  Are taking medicines that weaken the immune system, such as transplant medicines.  Are experiencing a lot of stress. What are the signs or symptoms? Early symptoms of this condition include itching, tingling, and pain in an area on your skin. Pain may be described as burning, stabbing, or throbbing. A few days or weeks after early symptoms start, a painful red rash appears. The rash is usually on one side of the body and has a band-like or belt-like pattern. The rash eventually turns into fluid-filled blisters that break open, change into scabs, and dry up in about 2-3 weeks. At any time during the infection, you may also develop:  A fever.  Chills.  A headache.  An upset stomach. How is this diagnosed? This condition is diagnosed with a skin exam. Skin or fluid samples may be taken from the blisters before a diagnosis is made. These samples are examined under a microscope or sent to  a lab for testing. How is this treated? The rash may last for several weeks. There is not a specific cure for this condition. Your health care provider will probably prescribe medicines to help you manage pain, recover more quickly, and avoid long-term problems. Medicines may include:  Antiviral drugs.  Anti-inflammatory drugs.  Pain medicines.  Anti-itching medicines (antihistamines). If the area involved is on your face, you may be referred to a specialist, such as an eye doctor (ophthalmologist) or an ear, nose, and throat (ENT) doctor (otolaryngologist) to help you avoid eye problems, chronic pain, or disability. Follow these instructions at home: Medicines  Take over-the-counter and prescription medicines only as told by your health care provider.  Apply an anti-itch cream or numbing cream to the affected area as told by your health care provider. Relieving itching and discomfort   Apply cold, wet cloths (cold compresses) to the area of the rash or blisters as told by your health care provider.  Cool baths can be soothing. Try adding baking soda or dry oatmeal to the water to reduce itching. Do not bathe in hot water. Blister and rash care  Keep your rash covered with a loose bandage (dressing). Wear loose-fitting clothing to help ease the pain of material rubbing against the rash.  Keep your rash and blisters clean by washing the area with mild soap and cool water as told by your health care provider.  Check your rash every day for signs of infection. Check for: ? More redness, swelling, or pain. ? Fluid   or blood. ? Warmth. ? Pus or a bad smell.  Do not scratch your rash or pick at your blisters. To help avoid scratching: ? Keep your fingernails clean and cut short. ? Wear gloves or mittens while you sleep, if scratching is a problem. General instructions  Rest as told by your health care provider.  Keep all follow-up visits as told by your health care provider. This  is important.  Wash your hands often with soap and water. If soap and water are not available, use hand sanitizer. Doing this lowers your chance of getting a bacterial skin infection.  Before your blisters change into scabs, your shingles infection can cause chickenpox in people who have never had it or have never been vaccinated against it. To prevent this from happening, avoid contact with other people, especially: ? Babies. ? Pregnant women. ? Children who have eczema. ? Elderly people who have transplants. ? People who have chronic illnesses, such as cancer or AIDS. Contact a health care provider if:  Your pain is not relieved with prescribed medicines.  Your pain does not get better after the rash heals.  You have signs of infection in the rash area, such as: ? More redness, swelling, or pain around the rash. ? Fluid or blood coming from the rash. ? The rash area feeling warm to the touch. ? Pus or a bad smell coming from the rash. Get help right away if:  The rash is on your face or nose.  You have facial pain, pain around your eye area, or loss of feeling on one side of your face.  You have difficulty seeing.  You have ear pain or have ringing in your ear.  You have a loss of taste.  Your condition gets worse. Summary  Shingles, which is also known as herpes zoster, is an infection that causes a painful skin rash and fluid-filled blisters.  This condition is diagnosed with a skin exam. Skin or fluid samples may be taken from the blisters and examined before the diagnosis is made.  Keep your rash covered with a loose bandage (dressing). Wear loose-fitting clothing to help ease the pain of material rubbing against the rash.  Before your blisters change into scabs, your shingles infection can cause chickenpox in people who have never had it or have never been vaccinated against it. This information is not intended to replace advice given to you by your health care  provider. Make sure you discuss any questions you have with your health care provider. Document Revised: 11/17/2018 Document Reviewed: 03/30/2017 Elsevier Patient Education  [place].    Gabapentin capsules or tablets What is this medicine? GABAPENTIN (GA ba pen tin) is used to control seizures in certain types of epilepsy. It is also used to treat certain types of nerve pain. This medicine may be used for other purposes; ask your health care provider or pharmacist if you have questions. COMMON BRAND NAME(S): Active-PAC with Gabapentin, Gabarone, Neurontin What should I tell my health care provider before I take this medicine? They need to know if you have any of these conditions:  history of drug abuse or alcohol abuse problem  kidney disease  lung or breathing disease  suicidal thoughts, plans, or attempt; a previous suicide attempt by you or a family member  an unusual or allergic reaction to gabapentin, other medicines, foods, dyes, or preservatives  pregnant or trying to get pregnant  breast-feeding How should I use this medicine? Take this medicine by  mouth with a glass of water. Follow the directions on the prescription label. You can take it with or without food. If it upsets your stomach, take it with food. Take your medicine at regular intervals. Do not take it more often than directed. Do not stop taking except on your doctor's advice. If you are directed to break the 600 or 800 mg tablets in half as part of your dose, the extra half tablet should be used for the next dose. If you have not used the extra half tablet within 28 days, it should be thrown away. A special MedGuide will be given to you by the pharmacist with each prescription and refill. Be sure to read this information carefully each time. Talk to your pediatrician regarding the use of this medicine in children. While this drug may be prescribed for children as young as 3 years for selected conditions,  precautions do apply. Overdosage: If you think you have taken too much of this medicine contact a poison control center or emergency room at once. NOTE: This medicine is only for you. Do not share this medicine with others. What if I miss a dose? If you miss a dose, take it as soon as you can. If it is almost time for your next dose, take only that dose. Do not take double or extra doses. What may interact with this medicine? This medicine may interact with the following medications:  alcohol  antihistamines for allergy, cough, and cold  certain medicines for anxiety or sleep  certain medicines for depression like amitriptyline, fluoxetine, sertraline  certain medicines for seizures like phenobarbital, primidone  certain medicines for stomach problems  general anesthetics like halothane, isoflurane, methoxyflurane, propofol  local anesthetics like lidocaine, pramoxine, tetracaine  medicines that relax muscles for surgery  narcotic medicines for pain  phenothiazines like chlorpromazine, mesoridazine, prochlorperazine, thioridazine This list may not describe all possible interactions. Give your health care provider a list of all the medicines, herbs, non-prescription drugs, or dietary supplements you use. Also tell them if you smoke, drink alcohol, or use illegal drugs. Some items may interact with your medicine. What should I watch for while using this medicine? Visit your doctor or health care provider for regular checks on your progress. You may want to keep a record at home of how you feel your condition is responding to treatment. You may want to share this information with your doctor or health care provider at each visit. You should contact your doctor or health care provider if your seizures get worse or if you have any new types of seizures. Do not stop taking this medicine or any of your seizure medicines unless instructed by your doctor or health care provider. Stopping your  medicine suddenly can increase your seizures or their severity. This medicine may cause serious skin reactions. They can happen weeks to months after starting the medicine. Contact your health care provider right away if you notice fevers or flu-like symptoms with a rash. The rash may be red or purple and then turn into blisters or peeling of the skin. Or, you might notice a red rash with swelling of the face, lips or lymph nodes in your neck or under your arms. Wear a medical identification bracelet or chain if you are taking this medicine for seizures, and carry a card that lists all your medications. You may get drowsy, dizzy, or have blurred vision. Do not drive, use machinery, or do anything that needs mental alertness until you know  how this medicine affects you. To reduce dizzy or fainting spells, do not sit or stand up quickly, especially if you are an older patient. Alcohol can increase drowsiness and dizziness. Avoid alcoholic drinks. Your mouth may get dry. Chewing sugarless gum or sucking hard candy, and drinking plenty of water will help. The use of this medicine may increase the chance of suicidal thoughts or actions. Pay special attention to how you are responding while on this medicine. Any worsening of mood, or thoughts of suicide or dying should be reported to your health care provider right away. Women who become pregnant while using this medicine may enroll in the [place] American Antiepileptic Drug Pregnancy Registry by calling [phone]. This registry collects information about the safety of antiepileptic drug use during pregnancy. What side effects may I notice from receiving this medicine? Side effects that you should report to your doctor or health care professional as soon as possible:  allergic reactions like skin rash, itching or hives, swelling of the face, lips, or tongue  breathing problems  rash, fever, and swollen lymph nodes  redness, blistering, peeling or  loosening of the skin, including inside the mouth  suicidal thoughts, mood changes Side effects that usually do not require medical attention (report to your doctor or health care professional if they continue or are bothersome):  dizziness  drowsiness  headache  nausea, vomiting  swelling of ankles, feet, hands  tiredness This list may not describe all possible side effects. Call your doctor for medical advice about side effects. You may report side effects to FDA at 1-800-FDA-1088. Where should I keep my medicine? Keep out of reach of children. This medicine may cause accidental overdose and death if it taken by other adults, children, or pets. Mix any unused medicine with a substance like cat litter or coffee grounds. Then throw the medicine away in a sealed container like a sealed bag or a coffee can with a lid. Do not use the medicine after the expiration date. Store at room temperature between 15 and 30 degrees C (59 and 86 degrees F). NOTE: This sheet is a summary. It may not cover all possible information. If you have questions about this medicine, talk to your doctor, pharmacist, or health care provider.  2020 Elsevier/Gold Standard (2018-10-27 14:16:43)

## 2019-12-20 NOTE — Assessment & Plan Note (Addendum)
Acute, ongoing.  Herpetic lesion began 12/17/2019, will need to remain out of work until lesion is resolved or scabbed over, tentatively 12/28/2019 as lesion is still currently blistered.  If scabbed over by then, can return to normal work duties without restriction.  Will start gabapentin 100 mg three times daily for neuropathic pain related to lesion.  Follow up in 1 week.

## 2019-12-20 NOTE — Assessment & Plan Note (Signed)
Acute, ongoing.  Likely due to plant irritant from gardening over weekend.  Will start on steroid taper x 6 days.  Continue supportive therapy with antihistamines including topicals.

## 2019-12-20 NOTE — Telephone Encounter (Signed)
Rx fixed and re-sent.  Thanks!

## 2019-12-20 NOTE — Progress Notes (Signed)
LMP 12/08/2019 (Exact Date)    Subjective:    Patient ID: Katelyn Whitehead, female    DOB: Oct 31, 1990, 29 y.o.   MRN: [id number]  HPI: Katelyn Whitehead is a 29 y.o. female presenting for shingles and follow up.  Chief Complaint  Patient presents with  . Herpes Zoster    Patient is experiencing a lot of pain. Pain has now radiated to jaw and in ear.   . Poison [place]    Was in the flower bed last week. It's on patient's arms, hands, legs.   SHINGLES Duration: days Location:   Nose, ear, chin Painful:  yes Severity: moderate  Paresthesia:  no Hyperesthesia: yes Itching:  no Burning:  yes Oozing:  no Blisters:  yes Fevers:  no History of the same:  no Alleviating factors: nothing tried Status: worse  SKIN LESION Has been trying hydrocortisone cream and calamine lotion. Duration: days Location: trunk, neck, arm  Painful: no Itching: yes Onset: gradual Context: bigger Associated signs and symptoms: blisters History of skin cancer: no History of precancerous skin lesions: no Family history of skin cancer: no   Patient is requesting FMLA paperwork and Short-Term Disability paperwork for the time she has been out for both COVID-19 and now shingles.   Allergies  Allergen Reactions  . Influenza Vaccines Hives  . Sulfa Antibiotics     Hives Rash   Outpatient Encounter Medications as of 12/20/2019  Medication Sig  . acetaminophen (TYLENOL) 500 MG tablet Take 500 mg by mouth every 6 (six) hours as needed.  Marland Kitchen albuterol (VENTOLIN HFA) 108 (90 Base) MCG/ACT inhaler Inhale 2 puffs into the lungs every 6 (six) hours as needed for wheezing or shortness of breath.  . cetirizine (ZYRTEC) 10 MG tablet Take 1 tablet (10 mg total) by mouth daily.  . diphenhydrAMINE HCl (BENADRYL ALLERGY PO) Take by mouth.  . etonogestrel-ethinyl estradiol (NUVARING) 0.12-0.015 MG/24HR vaginal ring Insert vaginally and leave in place for 3 consecutive weeks, then remove for 1 week.  Marland Kitchen LORazepam  (ATIVAN) 0.5 MG tablet Take by mouth.  . sertraline (ZOLOFT) 50 MG tablet 75 mg.   . valACYclovir (VALTREX) 1000 MG tablet Take 1 tablet (1,000 mg total) by mouth 3 (three) times daily.  Marland Kitchen VYVANSE 20 MG capsule Take 20 mg by mouth every morning.  . gabapentin (NEURONTIN) 100 MG capsule Take 1 capsule (100 mg total) by mouth 3 (three) times daily as needed (nerve pain).  . predniSONE (DELTASONE) 10 MG tablet Take 6 tablets (60 mg total) by mouth daily with breakfast for 1 day, THEN 5 tablets (50 mg total) daily with breakfast for 1 day, THEN 4 tablets (40 mg total) daily with breakfast for 1 day, THEN 3 tablets (30 mg total) daily with breakfast for 1 day, THEN 2 tablets (20 mg total) daily with breakfast for 1 day, THEN 1 tablet (10 mg total) daily with breakfast for 1 day. Take 6 tablets by mouth daily for 2 days, then reduce by 1 tablet every 2 days until gone.   No facility-administered encounter medications on file as of 12/20/2019.   Patient Active Problem List   Diagnosis Date Noted  . Herpes zoster without complication 49/70/2637  . Irritant contact dermatitis due to plants, except food 12/20/2019  . COVID-19 12/03/2019  . Mixed obsessional thoughts and acts 09/01/2018  . Mild mitral and aortic regurgitation   . Panic disorder   . Depression, major, recurrent, moderate ([place]) 12/04/2014   Past Medical History:  Diagnosis Date  . ADHD (attention deficit hyperactivity disorder)   . Anxiety   . Asthma    childhood  . Depression   . Mild mitral and aortic regurgitation    a. 04/2013 Echo: EF 50-55%, mild AI, mild MR, no MVP.  Marland Kitchen Mitral valve prolapse   . Palpitations    a. 2012 Holter: sinus tach, pvc's.  . Panic disorder    Relevant past medical, surgical, family and social history reviewed and updated as indicated. Interim medical history since our last visit reviewed.  Review of Systems  Constitutional: Negative.  Negative for fatigue and fever.  HENT: Negative.  Negative for  congestion, postnasal drip, rhinorrhea and sinus pressure.   Respiratory: Negative.  Negative for chest tightness, shortness of breath and wheezing.   Cardiovascular: Negative.  Negative for chest pain and palpitations.  Musculoskeletal: Negative.   Skin: Positive for color change and rash. Negative for pallor and wound.  Neurological: Negative.  Negative for dizziness, facial asymmetry and headaches.  Psychiatric/Behavioral: Negative.  Negative for decreased concentration. The patient is not nervous/anxious.    Per HPI unless specifically indicated above     Objective:    LMP 12/08/2019 (Exact Date)   Wt Readings from Last 3 Encounters:  12/18/19 163 lb (73.9 kg)  12/17/19 163 lb (73.9 kg)  09/10/19 165 lb 9.6 oz (75.1 kg)    Physical Exam Vitals and nursing note reviewed.  Constitutional:      General: She is not in acute distress.    Appearance: Normal appearance. She is not toxic-appearing.  Neck:     Comments: Unable to assess heart sounds via virtual visit Pulmonary:     Effort: Pulmonary effort is normal. No respiratory distress.     Comments: Unable to auscultate lung sounds via virtual visit. Musculoskeletal:     Cervical back: Normal range of motion. No rigidity.  Skin:    Coloration: Skin is not jaundiced or pale.     Findings: Lesion (fluid-filled erythematous lesion to nose, nontender) and rash (multiple areas of ertyhema to neck, hand, wrist, and forearm that are pruritic, open, and oozing clear exudate) present.  Neurological:     General: No focal deficit present.     Mental Status: She is alert and oriented to person, place, and time.     Motor: No weakness.     Gait: Gait normal.  Psychiatric:        Mood and Affect: Mood normal.        Behavior: Behavior normal.        Thought Content: Thought content normal.        Judgment: Judgment normal.       Assessment & Plan:   Problem List Items Addressed This Visit      Musculoskeletal and Integument    Irritant contact dermatitis due to plants, except food - Primary    Acute, ongoing.  Likely due to plant irritant from gardening over weekend.  Will start on steroid taper x 6 days.  Continue supportive therapy with antihistamines including topicals.      Relevant Medications   predniSONE (DELTASONE) 10 MG tablet     Other   COVID-19    Symptoms much improved today.  Has been out of work since 12/01/2019 due to positive COVID-19 test and has been unable to return due to new shingles that began 12/17/2019.  Will fill out FMLA paperwork and short term disability for this amount of time.  Herpes zoster without complication    Acute, ongoing.  Herpetic lesion began 12/17/2019, will need to remain out of work until lesion is resolved or scabbed over, tentatively 12/28/2019 as lesion is still currently blistered.  If scabbed over by then, can return to normal work duties without restriction.  Will start gabapentin 100 mg three times daily for neuropathic pain related to lesion.  Follow up in 1 week.      Relevant Medications   gabapentin (NEURONTIN) 100 MG capsule       Follow up plan: Return in about 8 days (around 12/28/2019) for follow up - virtual please.

## 2019-12-20 NOTE — Telephone Encounter (Signed)
Sent letter

## 2019-12-21 ENCOUNTER — Encounter: Payer: Self-pay | Admitting: Nurse Practitioner

## 2019-12-25 ENCOUNTER — Other Ambulatory Visit: Payer: Self-pay | Admitting: Nurse Practitioner

## 2019-12-25 ENCOUNTER — Encounter: Payer: Self-pay | Admitting: Nurse Practitioner

## 2019-12-25 DIAGNOSIS — L247 Irritant contact dermatitis due to plants, except food: Secondary | ICD-10-CM

## 2019-12-25 MED ORDER — PREDNISONE 10 MG PO TABS: ORAL_TABLET | ORAL | 0 refills | Status: DC

## 2019-12-25 NOTE — Progress Notes (Signed)
Medication sent in for prednisone taper

## 2019-12-28 ENCOUNTER — Other Ambulatory Visit: Payer: Self-pay

## 2019-12-28 ENCOUNTER — Encounter: Payer: Self-pay | Admitting: Nurse Practitioner

## 2019-12-28 ENCOUNTER — Ambulatory Visit (INDEPENDENT_AMBULATORY_CARE_PROVIDER_SITE_OTHER): Payer: No Typology Code available for payment source | Admitting: Nurse Practitioner

## 2019-12-28 VITALS — Temp 98.6°F

## 2019-12-28 DIAGNOSIS — L247 Irritant contact dermatitis due to plants, except food: Secondary | ICD-10-CM | POA: Diagnosis not present

## 2019-12-28 DIAGNOSIS — B029 Zoster without complications: Secondary | ICD-10-CM

## 2019-12-28 NOTE — Assessment & Plan Note (Signed)
Acute, improving with second steroid dose pack.  Advised to continue dose pack and finish completely.  If continues to persist after this dose pack, return to clinic.

## 2019-12-28 NOTE — Progress Notes (Signed)
Temp 98.6 F (37 C) (Tympanic)   LMP 12/08/2019 (Exact Date)    Subjective:    Patient ID: Katelyn Whitehead, female    DOB: 07-06-1991, 29 y.o.   MRN: [id number]  HPI: Katelyn Whitehead is a 29 y.o. female presenting for shingles and follow up.  Chief Complaint  Patient presents with  . Herpes Zoster    Follow-up. Patient states she's feeling better.   Marland Kitchen COVID-19  . Poison Oak   SHINGLES Patient reports the shingles is much improved and she does not have any evidence of it left on her face.  She reports the gabapentin helped with the pain, she only had to take it 1 time.  The valtrex also helped per patient. Duration: weeks Location: none Painful:  no Severity: no pain  Paresthesia:  no Hyperesthesia: no Itching:  no Burning:  no Oozing:  no Blisters:  no Fevers:  no History of the same:  no Alleviating factors: Valtrex and gabapentin Status: worse  SKIN LESION Poison oak has been improving.  The first round of corticosteroid did not work but the second round has begun to work and the rash has stopped spreading. Duration: weeks Location: trunk, neck, arms Painful: no Itching: yes Onset: gradual Context: smaller Associated signs and symptoms: blisters, itching History of skin cancer: no History of precancerous skin lesions: no Family history of skin cancer: no   Patient is requesting to return back to work starting Monday, 12/31/2019 without restrictions.  Allergies  Allergen Reactions  . Influenza Vaccines Hives  . Sulfa Antibiotics     Hives Rash   Outpatient Encounter Medications as of 12/28/2019  Medication Sig  . acetaminophen (TYLENOL) 500 MG tablet Take 500 mg by mouth every 6 (six) hours as needed.  Marland Kitchen albuterol (VENTOLIN HFA) 108 (90 Base) MCG/ACT inhaler Inhale 2 puffs into the lungs every 6 (six) hours as needed for wheezing or shortness of breath.  . cetirizine (ZYRTEC) 10 MG tablet Take 1 tablet (10 mg total) by mouth daily.  . diphenhydrAMINE  HCl (BENADRYL ALLERGY PO) Take by mouth.  . etonogestrel-ethinyl estradiol (NUVARING) 0.12-0.015 MG/24HR vaginal ring Insert vaginally and leave in place for 3 consecutive weeks, then remove for 1 week.  . gabapentin (NEURONTIN) 100 MG capsule Take 1 capsule (100 mg total) by mouth 3 (three) times daily as needed (nerve pain).  . LORazepam (ATIVAN) 0.5 MG tablet Take by mouth.  . predniSONE (DELTASONE) 10 MG tablet Take 6 tablets by mouth daily for 2 days, then reduce by 1 tablet every 2 days until gone  . sertraline (ZOLOFT) 50 MG tablet 75 mg.   . VYVANSE 20 MG capsule Take 20 mg by mouth every morning.  . [DISCONTINUED] valACYclovir (VALTREX) 1000 MG tablet Take 1 tablet (1,000 mg total) by mouth 3 (three) times daily. (Patient not taking: Reported on 12/28/2019)   No facility-administered encounter medications on file as of 12/28/2019.   Patient Active Problem List   Diagnosis Date Noted  . Herpes zoster without complication 12/20/2019  . Irritant contact dermatitis due to plants, except food 12/20/2019  . COVID-19 12/03/2019  . Mixed obsessional thoughts and acts 09/01/2018  . Mild mitral and aortic regurgitation   . Panic disorder   . Depression, major, recurrent, moderate (HCC) 12/04/2014   Past Medical History:  Diagnosis Date  . ADHD (attention deficit hyperactivity disorder)   . Anxiety   . Asthma    childhood  . Depression   . Mild mitral and  aortic regurgitation    a. 04/2013 Echo: EF 50-55%, mild AI, mild MR, no MVP.  Marland Kitchen Mitral valve prolapse   . Palpitations    a. 2012 Holter: sinus tach, pvc's.  . Panic disorder    Relevant past medical, surgical, family and social history reviewed and updated as indicated. Interim medical history since our last visit reviewed.  Review of Systems  Constitutional: Negative.  Negative for fatigue and fever.  HENT: Negative.  Negative for congestion, postnasal drip, rhinorrhea and sinus pressure.   Respiratory: Negative.  Negative for  chest tightness, shortness of breath and wheezing.   Cardiovascular: Negative.  Negative for chest pain and palpitations.  Musculoskeletal: Negative.   Skin: Positive for color change and rash. Negative for pallor and wound.  Neurological: Negative.  Negative for dizziness, facial asymmetry and headaches.  Psychiatric/Behavioral: Negative.  Negative for decreased concentration. The patient is not nervous/anxious.    Per HPI unless specifically indicated above     Objective:    Temp 98.6 F (37 C) (Tympanic)   LMP 12/08/2019 (Exact Date)   Wt Readings from Last 3 Encounters:  12/18/19 163 lb (73.9 kg)  12/17/19 163 lb (73.9 kg)  09/10/19 165 lb 9.6 oz (75.1 kg)    Physical Exam Vitals and nursing note reviewed.  Constitutional:      General: She is not in acute distress.    Appearance: Normal appearance. She is not toxic-appearing.  Neck:     Comments: Unable to assess heart sounds via virtual visit Pulmonary:     Effort: Pulmonary effort is normal. No respiratory distress.     Comments: Unable to auscultate lung sounds via virtual visit. Musculoskeletal:     Cervical back: Normal range of motion. No rigidity.  Skin:    Coloration: Skin is not jaundiced or pale.     Findings: Rash (multiple areas of ertyhema to neck, trunk, and arm that are crusted over) present.  Neurological:     General: No focal deficit present.     Mental Status: She is alert and oriented to person, place, and time.     Motor: No weakness.     Gait: Gait normal.  Psychiatric:        Mood and Affect: Mood normal.        Behavior: Behavior normal.        Thought Content: Thought content normal.        Judgment: Judgment normal.       Assessment & Plan:   Problem List Items Addressed This Visit      Musculoskeletal and Integument   Irritant contact dermatitis due to plants, except food - Primary    Acute, improving with second steroid dose pack.  Advised to continue dose pack and finish  completely.  If continues to persist after this dose pack, return to clinic.         Other   Herpes zoster without complication    Resolved.  Will release to return to work without restriction.  Note will be sent via mychart.          Follow up plan: Return if symptoms worsen or fail to improve.   Due to the catastrophic nature of the COVID-19 pandemic, this visit was completed via audio and visual contact via Mychart due to the restrictions of the COVID-19 pandemic. All issues as above were discussed and addressed. Physical exam was done as above through visual confirmation on Mychart. If it was felt that the patient should  be evaluated in the office, they were directed there. The patient verbally consented to this visit."} . Location of the patient: home . Location of the provider: work . Those involved with this call:  . Provider: Mardene Celeste, DNP . CMA: Myrtha Mantis, CMA . Front Desk/Registration: [facility]  . Time spent on call: 10 minutes on the phone discussing health concerns. 15 minutes total spent in review of patient's record and preparation of their chart.  I verified patient identity using two factors (patient name and date of birth). Patient consents verbally to being seen via telemedicine visit today.

## 2019-12-28 NOTE — Assessment & Plan Note (Signed)
Resolved.  Will release to return to work without restriction.  Note will be sent via mychart.

## 2020-02-27 ENCOUNTER — Other Ambulatory Visit: Payer: Self-pay | Admitting: Nurse Practitioner

## 2020-04-01 ENCOUNTER — Other Ambulatory Visit: Payer: Self-pay

## 2020-04-01 ENCOUNTER — Ambulatory Visit (INDEPENDENT_AMBULATORY_CARE_PROVIDER_SITE_OTHER): Payer: No Typology Code available for payment source | Admitting: Nurse Practitioner

## 2020-04-01 ENCOUNTER — Encounter: Payer: Self-pay | Admitting: Nurse Practitioner

## 2020-04-01 ENCOUNTER — Telehealth: Payer: Self-pay

## 2020-04-01 VITALS — BP 107/68 | HR 91 | Temp 98.5°F | Ht 61.0 in | Wt 164.0 lb

## 2020-04-01 DIAGNOSIS — R002 Palpitations: Secondary | ICD-10-CM | POA: Diagnosis not present

## 2020-04-01 DIAGNOSIS — Z0182 Encounter for allergy testing: Secondary | ICD-10-CM

## 2020-04-01 DIAGNOSIS — F331 Major depressive disorder, recurrent, moderate: Secondary | ICD-10-CM | POA: Diagnosis not present

## 2020-04-01 DIAGNOSIS — I080 Rheumatic disorders of both mitral and aortic valves: Secondary | ICD-10-CM | POA: Diagnosis not present

## 2020-04-01 NOTE — Progress Notes (Signed)
BP 107/68 (BP Location: Right Arm, Patient Position: Sitting, Cuff Size: Normal)    Pulse 91    Temp 98.5 F (36.9 C) (Oral)    Ht 5\' 1"  (1.549 m)    Wt 164 lb (74.4 kg)    SpO2 96%    BMI 30.99 kg/m    Subjective:    Patient ID: , female    DOB: 1990-10-05, 29 y.o.   MRN: [id number]  HPI: Katelyn Whitehead is a 29 y.o. female presenting with multiple concerns.  Chief Complaint  Patient presents with   Anxiety    Patient having increased anxiety.    Insomnia    Patient states she's been having more trouble sleeping.    Medication Management    Not taking Vyvanse due to it increased her heart palpitations. Patient see's cardiology.    COVID Immunity    Due to allergy to Flu Vaccine, patient's manager wanted her to be allergy tested for the vaccine and immunity.    ANXIETY Patient states that NP in [facility] manages medications for this - is trying to cut back on lorazepam. Feels more anxiety when her Nuva Ring is due to come out around her menses.  Wondering if she should increase her Zoloft.  Also interested in starting counseling. Duration:uncontrolled Anxious mood: yes  Excessive worrying: yes Irritability: yes  Sweating: no Nausea: no Palpitations:yes Hyperventilation: yes Panic attacks: no Agoraphobia: no  Obscessions/compulsions: no Depressed mood: yes Depression screen [facility] 2/9 04/01/2020 09/10/2019 05/12/2016  Decreased Interest 1 1 3   Down, Depressed, Hopeless 1 1 3   PHQ - 2 Score 2 2 6   Altered sleeping 2 2 2   Tired, decreased energy 2 2 0  Change in appetite 1 2 1   Feeling bad or failure about yourself  1 2 2   Trouble concentrating 3 2 3   Moving slowly or fidgety/restless 1 1 2   Suicidal thoughts 0 0 0  PHQ-9 Score 12 13 16   Difficult doing work/chores Not difficult at all Somewhat difficult -  Some encounter information is confidential and restricted. Go to Review Flowsheets activity to see all data.   GAD 7 : Generalized Anxiety Score  04/01/2020 09/10/2019  Nervous, Anxious, on Edge 3 3  Control/stop worrying 2 3  Worry too much - different things 3 3  Trouble relaxing 2 2  Restless 1 3  Easily annoyed or irritable 2 2  Afraid - awful might happen 2 3  Total GAD 7 Score 15 19  Anxiety Difficulty - Somewhat difficult  Some encounter information is confidential and restricted. Go to Review Flowsheets activity to see all data.   Anhedonia: yes Weight changes: no Insomnia: yes hard to fall asleep  Hypersomnia: no Fatigue/loss of energy: yes Feelings of worthlessness: no Feelings of guilt: no Impaired concentration/indecisiveness: yes Suicidal ideations: no  Crying spells: no Recent Stressors/Life Changes: no   Relationship problems: no   Family stress: no     Financial stress: no    Job stress: no   Recent death/loss: no  COVID ALLERGY TESTING  Patient reports the last time she got the flu vaccine, she breaks out in hives.  Also is highly allergic to poison oak, broke out last month in hives on legs and arms.    PALPITATIONS Sees Dr. for Cardiology - has a history of palpitations.  Allergies  Allergen Reactions   Influenza Vaccines Hives   Sulfa Antibiotics     Hives Rash   Outpatient Encounter Medications  as of 04/01/2020  Medication Sig   acetaminophen (TYLENOL) 500 MG tablet Take 500 mg by mouth every 6 (six) hours as needed.   albuterol (VENTOLIN HFA) 108 (90 Base) MCG/ACT inhaler TAKE 2 PUFFS BY MOUTH EVERY 6 HOURS AS NEEDED FOR WHEEZE OR SHORTNESS OF BREATH   diphenhydrAMINE HCl (BENADRYL ALLERGY PO) Take by mouth.   etonogestrel-ethinyl estradiol (NUVARING) 0.12-0.015 MG/24HR vaginal ring Insert vaginally and leave in place for 3 consecutive weeks, then remove for 1 week.   LORazepam (ATIVAN) 0.5 MG tablet Take by mouth.   sertraline (ZOLOFT) 50 MG tablet 75 mg.    [DISCONTINUED] cetirizine (ZYRTEC) 10 MG tablet Take 1 tablet (10 mg total) by mouth daily. (Patient not taking:  Reported on 04/01/2020)   [DISCONTINUED] gabapentin (NEURONTIN) 100 MG capsule Take 1 capsule (100 mg total) by mouth 3 (three) times daily as needed (nerve pain). (Patient not taking: Reported on 04/01/2020)   [DISCONTINUED] predniSONE (DELTASONE) 10 MG tablet Take 6 tablets by mouth daily for 2 days, then reduce by 1 tablet every 2 days until gone (Patient not taking: Reported on 04/01/2020)   [DISCONTINUED] VYVANSE 20 MG capsule Take 20 mg by mouth every morning. (Patient not taking: Reported on 04/01/2020)   No facility-administered encounter medications on file as of 04/01/2020.   Patient Active Problem List   Diagnosis Date Noted   Encounter for allergy testing 04/01/2020   Herpes zoster without complication 12/20/2019   Irritant contact dermatitis due to plants, except food 12/20/2019   COVID-19 12/03/2019   Mixed obsessional thoughts and acts 09/01/2018   Mild mitral and aortic regurgitation    Panic disorder    Depression, major, recurrent, moderate (HCC) 12/04/2014   Past Medical History:  Diagnosis Date   ADHD (attention deficit hyperactivity disorder)    Anxiety    Asthma    childhood   Depression    Mild mitral and aortic regurgitation    a. 04/2013 Echo: EF 50-55%, mild AI, mild MR, no MVP.   Mitral valve prolapse    Palpitations    a. 2012 Holter: sinus tach, pvc's.   Panic disorder    Relevant past medical, surgical, family and social history reviewed and updated as indicated. Interim medical history since our last visit reviewed.  Review of Systems  Constitutional: Negative.  Negative for activity change, appetite change, fatigue and fever.  Respiratory: Negative.  Negative for shortness of breath.   Cardiovascular: Positive for palpitations. Negative for chest pain and leg swelling.  Skin: Negative.  Negative for color change and rash.  Neurological: Negative.  Negative for dizziness, light-headedness and headaches.  Psychiatric/Behavioral:  Positive for agitation and sleep disturbance. Negative for behavioral problems, confusion, decreased concentration and suicidal ideas. The patient is nervous/anxious.     Per HPI unless specifically indicated above     Objective:    BP 107/68 (BP Location: Right Arm, Patient Position: Sitting, Cuff Size: Normal)    Pulse 91    Temp 98.5 F (36.9 C) (Oral)    Ht 5\' 1"  (1.549 m)    Wt 164 lb (74.4 kg)    SpO2 96%    BMI 30.99 kg/m   Wt Readings from Last 3 Encounters:  04/01/20 164 lb (74.4 kg)  12/18/19 163 lb (73.9 kg)  12/17/19 163 lb (73.9 kg)    Physical Exam Vitals and nursing note reviewed.  Constitutional:      General: She is not in acute distress.    Appearance: Normal appearance. She is  not toxic-appearing.  HENT:     Head: Normocephalic and atraumatic.  Cardiovascular:     Rate and Rhythm: Normal rate and regular rhythm.     Heart sounds: Normal heart sounds. No murmur heard.   Pulmonary:     Effort: Pulmonary effort is normal. No respiratory distress.     Breath sounds: Normal breath sounds. No wheezing, rhonchi or rales.  Skin:    General: Skin is warm and dry.     Coloration: Skin is not jaundiced or pale.  Neurological:     General: No focal deficit present.     Mental Status: She is alert and oriented to person, place, and time.     Motor: No weakness.     Gait: Gait normal.  Psychiatric:        Mood and Affect: Mood normal.        Behavior: Behavior normal.        Thought Content: Thought content normal.        Judgment: Judgment normal.     Results for orders placed or performed during the hospital encounter of 09/13/19  Comprehensive metabolic panel  Result Value Ref Range   Sodium 137 135 - 145 mmol/L   Potassium 3.7 3.5 - 5.1 mmol/L   Chloride 104 98 - 111 mmol/L   CO2 25 22 - 32 mmol/L   Glucose, Bld 113 (H) 70 - 99 mg/dL   BUN 15 6 - 20 mg/dL   Creatinine, Ser 2.54 0.44 - 1.00 mg/dL   Calcium 9.1 8.9 - 27.0 mg/dL   Total Protein 7.5 6.5 -  8.1 g/dL   Albumin 3.8 3.5 - 5.0 g/dL   AST 20 15 - 41 U/L   ALT 21 0 - 44 U/L   Alkaline Phosphatase 77 38 - 126 U/L   Total Bilirubin 0.4 0.3 - 1.2 mg/dL   GFR calc non Af Amer >60 >60 mL/min   GFR calc Af Amer >60 >60 mL/min   Anion gap 8 5 - 15  VITAMIN D 25 Hydroxy (Vit-D Deficiency, Fractures)  Result Value Ref Range   Vit D, 25-Hydroxy 39.03 30 - 100 ng/mL  Vitamin B12  Result Value Ref Range   Vitamin B-12 255 180 - 914 pg/mL  TSH  Result Value Ref Range   TSH 1.543 0.350 - 4.500 uIU/mL  CBC with Differential/Platelet  Result Value Ref Range   WBC 7.3 4.0 - 10.5 K/uL   RBC 5.06 3.87 - 5.11 MIL/uL   Hemoglobin 13.5 12.0 - 15.0 g/dL   HCT 62.3 36 - 46 %   MCV 82.6 80.0 - 100.0 fL   MCH 26.7 26.0 - 34.0 pg   MCHC 32.3 30.0 - 36.0 g/dL   RDW 76.2 83.1 - 51.7 %   Platelets 345 150 - 400 K/uL   nRBC 0.0 0.0 - 0.2 %   Neutrophils Relative % 57 %   Neutro Abs 4.2 1.7 - 7.7 K/uL   Lymphocytes Relative 25 %   Lymphs Abs 1.9 0.7 - 4.0 K/uL   Monocytes Relative 11 %   Monocytes Absolute 0.8 0 - 1 K/uL   Eosinophils Relative 6 %   Eosinophils Absolute 0.4 0 - 0 K/uL   Basophils Relative 1 %   Basophils Absolute 0.1 0 - 0 K/uL   Immature Granulocytes 0 %   Abs Immature Granulocytes 0.03 0.00 - 0.07 K/uL  Hemoglobin A1c  Result Value Ref Range   Hgb A1c MFr Bld 5.5 4.8 -  5.6 %   Mean Plasma Glucose 111.15 mg/dL      Assessment & Plan:   Problem List Items Addressed This Visit      Cardiovascular and Mediastinum   Mild mitral and aortic regurgitation    With complaints of palpitations, EKG today normal.  Encouraged patient to follow up with Cardiology if palpitations are worsening, becoming more frequent, or having other symptoms associated with palpitations like dizziness or syncope.  Patient stated she would reach out to Cardiology to make appointment.      Relevant Orders   EKG 12-Lead (Completed)     Other   Depression, major, recurrent, moderate (HCC)     Chronic, ongoing.  Follows with Psychiatry in [place].  Continue to follow with them, next appointment in October.  Will increase sertraline to 100 mg today and monitor response.  Will place referral for CCM SW for counseling today also - encouraged to reach out to private counselors if interested also.      Relevant Orders   Referral to Chronic Care Management Services   Encounter for allergy testing    Patient desiring allergy testing prior to obtaining COVID vaccine for employer.  Has reaction to flu vaccine and would like to have allergy testing prior to receiving COVID vaccine.      Relevant Orders   Ambulatory referral to Allergy    Other Visit Diagnoses    Palpitations    -  Primary    EKG today normal.  Encouraged patient to follow up with Cardiology if palpitations become more frequent or with any associted symptoms.    Relevant Orders   EKG 12-Lead (Completed)       Follow up plan: Return if symptoms worsen or fail to improve.   35+ minutes spent with patient during encounter today.

## 2020-04-01 NOTE — Chronic Care Management (AMB) (Signed)
  Care Management   Outreach Note  04/01/2020 Name: Katelyn Whitehead MRN: [id number] DOB: 1990/12/18  Referred by: Gabriel Cirri, NP Reason for referral : Care Coordination (Outreach to schedule referral with LCSW )   An unsuccessful telephone outreach was attempted today. The patient was referred to the case management team for assistance with care management and care coordination.   Follow Up Plan: A HIPPA compliant phone message was left for the patient providing contact information and requesting a return call.  The care management team will reach out to the patient again over the next 7 days.  If patient returns call to provider office, please advise to call Embedded Care Management Care Guide Penne Lash at [phone]  Penne Lash, RMA Care Guide, Embedded Care Coordination [facility] [address]  [place], [place] [address] Direct Dial: [phone] [email or web address]

## 2020-04-01 NOTE — Assessment & Plan Note (Signed)
Chronic, ongoing.  Follows with Psychiatry in [place].  Continue to follow with them, next appointment in October.  Will increase sertraline to 100 mg today and monitor response.  Will place referral for CCM SW for counseling today also - encouraged to reach out to private counselors if interested also.

## 2020-04-01 NOTE — Assessment & Plan Note (Signed)
Patient desiring allergy testing prior to obtaining COVID vaccine for employer.  Has reaction to flu vaccine and would like to have allergy testing prior to receiving COVID vaccine.

## 2020-04-01 NOTE — Assessment & Plan Note (Addendum)
With complaints of palpitations, EKG today normal.  Encouraged patient to follow up with Cardiology if palpitations are worsening, becoming more frequent, or having other symptoms associated with palpitations like dizziness or syncope.  Patient stated she would reach out to Cardiology to make appointment.

## 2020-04-01 NOTE — Patient Instructions (Signed)
Allergy Skin Testing Why am I having this test?     Allergy skin testing is done to check whether you have an allergy to something. An allergy occurs when your body's defense system (immune system) is more sensitive to certain substances. The immune system overreacts to the substance, causing allergy symptoms. Allergy skin testing can be used to help find out which substance may be causing your symptoms. Skin testing may be done in one of three ways:  Injecting a small amount of the substance that you may be allergic to (allergen) under your skin (intradermal test).  Placing a drop of a solution that contains the allergen onto your forearm or back, then puncturing the skin where the droplet is and allowing the fluid to seep in (prick-puncture test or scratch test).  Applying the allergen to patches that are placed on your skin (patch test). What is being tested? This test checks to see if an allergic reaction develops on the skin where the allergen was injected or where the patches were applied. Common allergens tested include:  Pollens.  Dust.  Food.  Latex. How do I prepare for this test?  If you are having the intradermal or prick-puncture test, no preparation is needed.  If you are having a patch test: ? Do not apply ointments, creams, or lotion to the skin where the patch will be placed. Usually, the patches are placed on your forearm or on your back. ? Bring any items that you think you are allergic to, such as cosmetics, soaps, and perfume. Tell a health care provider about:  Any allergies you have, especially if you have ever had a severe allergic reaction.  All medicines you are taking, including vitamins, herbs, eye drops, creams, and over-the-counter medicines. Some medicines can affect test results. Your health care provider will let you know when to stop taking those medicines and when you can begin taking them again.  Any medical conditions you have. What happens  during the test? If you will receive an injection or prick-puncture, it will be done in your health care provider's office, and you will likely get your results before leaving. If patches will be applied to your skin, you will need to:  Wear them for 48 hours.  Return to your health care provider's office to have them removed. Do not remove them yourself.  Avoid bathing and activities that cause heavy sweating until after the patches are removed. How are the results reported? Your test results will be reported as measurements of any areas of skin affected. Your health care provider will compare your results to normal ranges that were established after testing a large group of people (reference ranges). Reference ranges may vary among labs and hospitals. For this test, a common normal reference range is:  A swollen area of skin (wheal) less than 3 mm in diameter.  Surrounding redness and swelling (flare) less than 10 mm in diameter. What do the results mean?  A result in which the wheal is 3 mm or more and the flare is 10 mm or more means that you are likely allergic to the allergen. Talk with your health care provider about what your results mean. Your health care provider will consider the results of your test in addition to your symptoms before diagnosing you with an allergy. Questions to ask your health care provider Ask your health care provider, or the department that is doing the test:  When will my results be ready?  How will I  get my results?  What are my treatment options?  What other tests do I need?  What are my next steps? Summary  Allergy skin testing is done to check whether you have an allergy to something.  This test checks to see if an allergic reaction develops on the skin where the allergen was injected or where the patches were applied.  Skin testing may be done in one of three ways: an intradermal test, a prick-puncture or scratch test, or a patch test.  A  wheal or flare on the skin that is larger than a certain size means that you are likely allergic to the allergen.  Talk with your health care provider about what your results mean. This information is not intended to replace advice given to you by your health care provider. Make sure you discuss any questions you have with your health care provider. Document Revised: 07/08/2017 Document Reviewed: 05/17/2017 Elsevier Patient Education  2020 [employer].

## 2020-04-04 NOTE — Chronic Care Management (AMB) (Signed)
  Care Management   Outreach Note  04/04/2020 Name: Katelyn Whitehead MRN: [id number] DOB: [birth year]  Referred by: Gabriel Cirri, NP Reason for referral : Care Coordination (Outreach to schedule referral with LCSW ) and Care Coordination (Second Outreach to schedule referral with LCSW )   A second unsuccessful telephone outreach was attempted today. The patient was referred to the case management team for assistance with care management and care coordination.   Follow Up Plan: A HIPPA compliant phone message was left for the patient providing contact information and requesting a return call.  The care management team will reach out to the patient again over the next 5 days.  If patient returns call to provider office, please advise to call Embedded Care Management Care Guide Penne Lash at [phone]  Penne Lash, RMA Care Guide, Embedded Care Coordination [facility]  [place], [place] [address] Direct Dial: [phone] [email or web address] Website: [email or web address]

## 2020-04-04 NOTE — Chronic Care Management (AMB) (Signed)
  Care Management   Note  04/04/2020 Name: PERCY COMP MRN: [id number] DOB: Mar 09, 1991  [name] is a 29 y.o. year old female who is a primary care patient of Gabriel Cirri, NP. I reached out to [name] by phone today in response to a referral sent by Ms. Pincus Badder Rann's PCP Gabriel Cirri NP.    Ms. Presswood was given information about care management services today including:  1. Care management services include personalized support from designated clinical staff supervised by her physician, including individualized plan of care and coordination with other care providers 2. 24/7 contact phone numbers for assistance for urgent and routine care needs. 3. The patient may stop care management services at any time by phone call to the office staff.  Patient agreed to services and verbal consent obtained.   Follow up plan: Telephone appointment with care management team member scheduled for:04/30/2020  Penne Lash, RMA Care Guide, Embedded Care Coordination [facility]  [place], [place] [address] Direct Dial: [phone] [email or web address]

## 2020-04-07 ENCOUNTER — Telehealth: Payer: Self-pay | Admitting: Allergy

## 2020-04-07 NOTE — Telephone Encounter (Signed)
Patient called and I scheduled her for wed. 04/09/2020/ at 9;00 for skin testing and has had problem with flu shot. Can you review her chart and see if she needs skin testing or compton testing.

## 2020-04-08 NOTE — Telephone Encounter (Signed)
This patient was scheduled already but I wanted to see if she was on your list?

## 2020-04-09 ENCOUNTER — Encounter: Payer: Self-pay | Admitting: Allergy

## 2020-04-09 ENCOUNTER — Other Ambulatory Visit: Payer: Self-pay

## 2020-04-09 ENCOUNTER — Ambulatory Visit (INDEPENDENT_AMBULATORY_CARE_PROVIDER_SITE_OTHER): Payer: No Typology Code available for payment source | Admitting: Allergy

## 2020-04-09 VITALS — BP 108/66 | HR 88 | Temp 97.7°F | Resp 18 | Ht 62.0 in | Wt 167.2 lb

## 2020-04-09 DIAGNOSIS — T50B95D Adverse effect of other viral vaccines, subsequent encounter: Secondary | ICD-10-CM

## 2020-04-09 DIAGNOSIS — J3089 Other allergic rhinitis: Secondary | ICD-10-CM

## 2020-04-09 NOTE — Telephone Encounter (Signed)
I scheduled this patient for 04/28/20 at 8:30 per [place], in [facility].

## 2020-04-09 NOTE — Patient Instructions (Addendum)
Adverse reaction to vaccine  - history of hives after previous administration of flu vaccine  - discussed today option of Covid vaccine component testing today with goal of vaccine administration if testing is negative.  We will get you scheduled for testing on 04/28/20 at 8:30am at our [facility] location.  Do not take any antihistamines for at least 3 days prior to this testing.   Allergies  - environmental allergy testing is positive to weed pollen, dust mite, cat, dog and horse.  Allergen avoidance measures provided  - for allergy symptom control can take antihistamine like Allegra 180mg , Xyzal 5mg  or Zyrtec 10mg  daily as needed  - for nasal congestion/drainage recommend use of nasal steroid like Flonase, Rhinocort or Nasacort 2 sprays each nostril daily for 1-2 weeks at a time before stopping once symptoms improve  - for itchy/watery/red eyes can use Pataday 1 drop each eye daily as needed  - have access to albuterol inhaler 2 puffs every 4-6 hours as needed for cough/wheeze/shortness of breath/chest tightness.  May use 15-20 minutes prior to activity.   Monitor frequency of use.    Follow-up for Covid vaccine component testing as above

## 2020-04-09 NOTE — Progress Notes (Signed)
New Patient Note  RE: Katelyn Whitehead MRN: [id number] DOB: 1991/03/22 Date of Office Visit: 04/09/2020  Referring provider: Gabriel Cirri, NP Primary care provider: Valentino Nose, NP  Chief Complaint: reaction to flu vaccine  History of present illness: Katelyn Whitehead is a 29 y.o. female presenting today for consultation for adverse reaction to vaccine.  She reports having reaction to flu vaccine.  She reports develoing hives within 15 minutes after having flu vaccine.  Hives were on chest, neck, stomach.  This occurred 6 years ago.  She had had flu vaccines in previous years without issue.  Sh states the hives lasted for about  weeks to 1 month.  She was seen in the employee health for this.  Has not had any flu vaccine since.  She does have a sulfa allergy and reports developing hives when she was little.  Her mother reported this to her as an allergy.   She has taken miralax before without issue.   She has had surgery before requiring anesthesia and she does report having had chest pain after anesthesia and EKG had signs of prolonged QT per patient.   Has not had any facial fillers.   She did have Covid in April 2021.  She reports symptoms including congestion, chest tightness, diarrhea, stomach pain, loss taste and smell, fever, runny nose, fatigue.  Did not require hospitalization.    She does report having environmental allergy symptoms especially with exposure to cats, dogs and horses.  She states she also has had wheezing and cough and has needed to use albuterol at times with cat dog course exposure.  Review of systems in the past 4 weeks: Review of Systems  Constitutional: Negative.   HENT: Negative.   Eyes: Negative.   Respiratory: Negative.   Cardiovascular: Negative.   Gastrointestinal: Negative.   Musculoskeletal: Negative.   Skin: Negative.   Neurological: Negative.     All other systems negative unless noted above in HPI  Past medical history: Past  Medical History:  Diagnosis Date  . ADHD (attention deficit hyperactivity disorder)   . Anxiety   . Asthma    childhood  . Depression   . Mild mitral and aortic regurgitation    a. 04/2013 Echo: EF 50-55%, mild AI, mild MR, no MVP.  Marland Kitchen Mitral valve prolapse   . Palpitations    a. 2012 Holter: sinus tach, pvc's.  . Panic disorder   . Urticaria     Past surgical history: Past Surgical History:  Procedure Laterality Date  . IUD REMOVAL N/A 11/22/2016   Procedure: LAPAROCOPIC INTRAUTERINE DEVICE (IUD) REMOVAL;  Surgeon: Linzie Collin, MD;  Location: [facility] ORS;  Service: Gynecology;  Laterality: N/A;  . TYMPANOSTOMY TUBE PLACEMENT    . WISDOM TOOTH EXTRACTION      Family history:  Family History  Problem Relation Age of Onset  . Depression Mother   . Diabetes Mother        type 2  . Hyperlipidemia Mother   . Hypertension Mother   . Melanoma Mother        skin  . Asthma Mother   . Allergic rhinitis Mother   . Drug abuse Father   . Gout Father   . Depression Father   . Psychiatric Illness Father        PTSD  . Allergic rhinitis Father   . Urticaria Father   . Allergic rhinitis Sister   . ADD / ADHD Brother   . Depression  Maternal Aunt   . Dementia Maternal Grandmother   . Depression Maternal Grandmother   . Heart disease Maternal Grandfather   . Diabetes Paternal Grandmother        type 2  . Hypertension Paternal Grandmother     Social history: She lives in a home without carpeting with gas heating and central cooling.  No pets in the home.  2 outdoor cats.  She denies any concern for water damage, mildew or roaches in the home.  She is a Lawyer.  She denies a smoking history.  Medication List: Current Outpatient Medications  Medication Sig Dispense Refill  . acetaminophen (TYLENOL) 500 MG tablet Take 500 mg by mouth every 6 (six) hours as needed.    Marland Kitchen albuterol (VENTOLIN HFA) 108 (90 Base) MCG/ACT inhaler TAKE 2 PUFFS BY MOUTH EVERY 6 HOURS AS NEEDED FOR WHEEZE OR  SHORTNESS OF BREATH 18 g 1  . diphenhydrAMINE HCl (BENADRYL ALLERGY PO) Take by mouth.    . etonogestrel-ethinyl estradiol (NUVARING) 0.12-0.015 MG/24HR vaginal ring Insert vaginally and leave in place for 3 consecutive weeks, then remove for 1 week. 1 each 12  . LORazepam (ATIVAN) 0.5 MG tablet Take by mouth.    . sertraline (ZOLOFT) 50 MG tablet 75 mg.      No current facility-administered medications for this visit.    Known medication allergies: Allergies  Allergen Reactions  . Influenza Vaccines Hives  . Sulfa Antibiotics     Hives Rash     Physical examination: Blood pressure 108/66, pulse 88, temperature 97.7 F (36.5 C), temperature source Temporal, resp. rate 18, height 5\' 2"  (1.575 m), weight 167 lb 3.2 oz (75.8 kg), SpO2 96 %, currently breastfeeding.  General: Alert, interactive, in no acute distress. HEENT: PERRLA, TMs pearly gray, turbinates non-edematous without discharge, post-pharynx non erythematous. Neck: Supple without lymphadenopathy. Lungs: Clear to auscultation without wheezing, rhonchi or rales. {no increased work of breathing. CV: Normal S1, S2 without murmurs. Abdomen: Nondistended, nontender. Skin: Warm and dry, without lesions or rashes. Extremities:  No clubbing, cyanosis or edema. Neuro:   Grossly intact.  Diagnositics/Labs:  Allergy testing: Environmental allergy skin prick testing is positive to short and giant ragweed, dust mite (DP), cat, dog and horse. Allergy testing results were read and interpreted by provider, documented by clinical staff.   Assessment and plan:   Adverse reaction to vaccine  - history of hives after previous administration of flu vaccine  -Based on history of previous vaccine reaction in patients comfortability level we decided to proceed with Covid component testing.  - discussed today option of Covid vaccine component testing today with goal of vaccine administration if testing is negative.  We will get you  scheduled for testing on 04/28/20 at 8:30am at our [facility] location.  Do not take any antihistamines for at least 3 days prior to this testing.   Allergic rhinitis with reactive airway  - environmental allergy testing is positive to weed pollen, dust mite, cat, dog and horse.  Allergen avoidance measures provided  - for allergy symptom control can take antihistamine like Allegra 180mg , Xyzal 5mg  or Zyrtec 10mg  daily as needed  - for nasal congestion/drainage recommend use of nasal steroid like Flonase, Rhinocort or Nasacort 2 sprays each nostril daily for 1-2 weeks at a time before stopping once symptoms improve  - for itchy/watery/red eyes can use Pataday 1 drop each eye daily as needed  - have access to albuterol inhaler 2 puffs every 4-6 hours as needed for  cough/wheeze/shortness of breath/chest tightness.  May use 15-20 minutes prior to activity.   Monitor frequency of use.    Follow-up for Covid vaccine component testing as above  I appreciate the opportunity to take part in Maygan's care. Please do not hesitate to contact me with questions.  Sincerely,   Margo Aye, MD Allergy/Immunology [facility]

## 2020-04-13 ENCOUNTER — Other Ambulatory Visit: Payer: Self-pay | Admitting: Nurse Practitioner

## 2020-04-28 ENCOUNTER — Ambulatory Visit: Payer: No Typology Code available for payment source

## 2020-04-28 ENCOUNTER — Encounter: Payer: Self-pay | Admitting: Allergy & Immunology

## 2020-04-28 ENCOUNTER — Ambulatory Visit (INDEPENDENT_AMBULATORY_CARE_PROVIDER_SITE_OTHER): Payer: No Typology Code available for payment source | Admitting: Allergy & Immunology

## 2020-04-28 ENCOUNTER — Other Ambulatory Visit: Payer: Self-pay

## 2020-04-28 VITALS — BP 118/72 | HR 85 | Temp 97.9°F | Resp 18 | Wt 166.0 lb

## 2020-04-28 DIAGNOSIS — T50B95D Adverse effect of other viral vaccines, subsequent encounter: Secondary | ICD-10-CM | POA: Diagnosis not present

## 2020-04-28 DIAGNOSIS — Z23 Encounter for immunization: Secondary | ICD-10-CM | POA: Diagnosis not present

## 2020-04-28 DIAGNOSIS — J3089 Other allergic rhinitis: Secondary | ICD-10-CM

## 2020-04-28 DIAGNOSIS — T50Z95D Adverse effect of other vaccines and biological substances, subsequent encounter: Secondary | ICD-10-CM

## 2020-04-28 NOTE — Progress Notes (Signed)
   Covid-19 Vaccination Clinic  Name:  Katelyn Whitehead    MRN: [id number] DOB: Apr 24, 1991  04/28/2020  Katelyn Whitehead was observed post Covid-19 immunization for 30 minutes based on pre-vaccination screening without incident. She was provided with Vaccine Information Sheet and instruction to access the V-Safe system.   Katelyn Whitehead was instructed to call 911 with any severe reactions post vaccine: Marland Kitchen Difficulty breathing  . Swelling of face and throat  . A fast heartbeat  . A bad rash all over body  . Dizziness and weakness   Immunizations Administered    Name Date Dose VIS Date Route   Pfizer COVID-19 Vaccine 04/28/2020 10:33 AM 0.3 mL 10/03/2018 Intramuscular   Manufacturer: [employer], [employer]   Lot: [id number]   NDC: [address]-5

## 2020-04-28 NOTE — Progress Notes (Signed)
FOLLOW UP  Date of Service/Encounter:  04/28/20   Assessment/Plan:   Vaccines and biological substances causing adverse effect  Katelyn Whitehead did remarkably well today with the testing.  We did not do the MiraLAX challenge because she has tolerated MiraLAX as recently as a month ago without a problem.  She tolerated the graded dosing of the COVID-19 vaccine without adverse event.  Vital signs remained normal.  She did take a part of her Ativan to help her get through some of the anxiety, which seems to have worked well.  Her vitals remained stable throughout the visit.  She is very appreciative and I offered her to get the second dose here in the office, but she might go ahead and get it at her workplace. She will call us and let us know.   Subjective:   Katelyn UNCAPHER is a 29 y.o. female presenting today for follow up of  Chief Complaint  Patient presents with  . Allergy Testing    Covid Component Testing    ADAMARI FREDE has a history of the following: Patient Active Problem List   Diagnosis Date Noted  . Encounter for allergy testing 04/01/2020  . Herpes zoster without complication 12/20/2019  . Irritant contact dermatitis due to plants, except food 12/20/2019  . COVID-19 12/03/2019  . Mixed obsessional thoughts and acts 09/01/2018  . Mild mitral and aortic regurgitation   . Panic disorder   . Depression, major, recurrent, moderate (HCC) 12/04/2014    History obtained from: chart review and patient.  Katelyn Whitehead is a 29 y.o. female presenting for skin testing.  She was last seen in September 2021 on September 1 for evaluation of an adverse vaccine reaction.  Her reaction consisted of hives within 15 minutes after getting her flu vaccine.  She had hives on her chest, neck, and stomach.  This occurred around 2015 and she has not had a flu vaccine since then.  At the last visit, she reported that she had taken MiraLAX without any issue.  She has never had an issue with anesthesia.  She  has never had any facial fillers.  After discussion with Dr. Delorse Lek, she decided to go ahead and do COVID component testing.  She also has a history of allergic rhinitis and had skin testing that was positive to ragweed, dust mite, cat, dog, and horse.  She was started on an over-the-counter antihistamine daily as well as an over-the-counter nasal steroid.  She also was given a prescription for Pataday.  Since the last visit, she has done very well.  She does have a history of anxiety and she realizes that there is a lot of influence on 1 over the other, so she did bring some Ativan to take just in case.  She is very motivated to get the vaccine.  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   Otherwise, there have been no changes to her past medical history, surgical history, family history, or social history.    Review of Systems  Constitutional: Negative.  Negative for chills, fever, malaise/fatigue and weight loss.  HENT: Positive for congestion. Negative for ear discharge, ear pain and sinus pain.   Eyes: Negative for pain, discharge and redness.  Respiratory: Negative for cough, sputum production, shortness of breath and wheezing.   Cardiovascular: Negative.  Negative for chest pain and palpitations.  Gastrointestinal: Negative for abdominal pain, constipation, diarrhea, heartburn, nausea and vomiting.  Skin: Negative.  Negative for itching and rash.  Neurological: Negative for dizziness and headaches.  Endo/Heme/Allergies: Positive for environmental allergies. Does not bruise/bleed easily.  Psychiatric/Behavioral: The patient is nervous/anxious.        Objective:   Blood pressure 118/72, pulse 85, temperature 97.9 F (36.6 C), temperature source Temporal, resp. rate 18, weight 166 lb (75.3 kg), currently breastfeeding. Body mass index is 30.36 kg/m.   Physical Exam:   Physical Exam Constitutional:       Appearance: She is well-developed.  HENT:     Head: Normocephalic and atraumatic.     Right Ear: Tympanic membrane, ear canal and external ear normal.     Left Ear: Tympanic membrane, ear canal and external ear normal.     Nose: No nasal deformity, septal deviation, mucosal edema or rhinorrhea.     Right Sinus: No maxillary sinus tenderness or frontal sinus tenderness.     Left Sinus: No maxillary sinus tenderness or frontal sinus tenderness.     Mouth/Throat:     Mouth: Mucous membranes are not pale and not dry.     Pharynx: Uvula midline.  Eyes:     General: Allergic shiner present.        Right eye: No discharge.        Left eye: No discharge.     Conjunctiva/sclera: Conjunctivae normal.     Right eye: Right conjunctiva is not injected. No chemosis.    Left eye: Left conjunctiva is not injected. No chemosis.    Pupils: Pupils are equal, round, and reactive to light.  Cardiovascular:     Rate and Rhythm: Normal rate and regular rhythm.     Heart sounds: Normal heart sounds.  Pulmonary:     Effort: Pulmonary effort is normal. No tachypnea, accessory muscle usage or respiratory distress.     Breath sounds: Normal breath sounds. No wheezing, rhonchi or rales.  Chest:     Chest wall: No tenderness.  Lymphadenopathy:     Cervical: No cervical adenopathy.  Skin:    Coloration: Skin is not pale.     Findings: No abrasion, erythema, petechiae or rash. Rash is not papular, urticarial or vesicular.  Neurological:     Mental Status: She is alert.      Diagnostic studies:     Allergy Studies:     COVID Vaccine Testing - 04/28/20 1055      Test Information   Consent Yes    Medications Miralax    Triamcinolone Lot # [id number]    Triamcinolone EXP DATE 06/09/21    Methylprednisolone Lot # [id number]    Methylprednisolone EXP DATE 10/07/20    Miralax Lot # [id number]    Miralax EXP DATE 12/07/20      Pre Test Vitals   BP 118/72    Pulse 85    Resp 18      SKIN PRICK TESTING - Arm  #1   Location Right Arm    Select Select      HISTAMINE (1mg /mL) Skin Prick Arm #1   Histamine Time Testing Placed 0900    Histamine Wheal 2+      Control (negative - HSA) Skin Prick Arm #1   Control Time Testing Placed 0900    Control Wheal Negative      Triamcinolone (40mg /mL) Skin Prick Arm #1   Triamcinolone Time Testing Placed 0900    Triamcinolone Wheal Negative      Methylprednisolone (40mg /mL) Skin Prick Arm #1   Methylprednisolone Time Testing  Placed 0900    Methylprednisolone Wheal Negative      Miralax (1:100 or 1.7 mg/mL) Skin Prick Arm #1   Miralax Time Testing Placed 0900    Miralax Wheal Negative      Miralax (1:10 or 17mg /mL) Skin Prick Arm #1   Miralax Time Testing Placed 0924    Miralax Wheal Negative      Miralax (1:1 or 170mg /mL) Skin Prick Arm #1   Miralax Time Testing Placed 0945    Miralax Wheal Negative      INTRADERMAL TESTING - Arm #2   Location Left Arm    Select Select      Control (negative - HSA) Intradermal Arm #2   Control Time Testing Placed  0924    Control Wheal Negative      Triamcinolone (1:100) Intradermal Arm #2   Triamcinolone Time Testing Placed 0924    Triamcinolone Wheal Negative      Methylprednisolone (1:100) Intradermal Arm #2   Methylprednisolone Time Testing Placed  0924    Methylprednisolone Wheal Negative      Triamcinolone (1:10) Intradermal Arm #2   Triamcinolone Time Testing Placed 0945    Triamcinolone Wheal Negative      Methylprednisolone (1:10) Intradermal Arm #2   Methylprednisolone Time Testing Placed  0945    Methylprednisolone Wheal Negative      Triamcinolone (1:1) Intradermal Arm #2   Triamcinolone Time Testing Placed 1012    Triamcinolone Wheal Negative      Skin Prick/Intradermal Post Testing   Skin Prick/Intradermal Testing Total Pricks 13      Post Test Vitals   BP 116/70    Pulse 84    Resp 18           We did not do the MiraLAX oral challenge.  She had already tolerated MiraLAX  fairly recently.  Therefore, we gave one third of the Pfizer dose and monitored her for 30 minutes.  Then we gave two thirds of a dose and monitored her for another 30 minutes.  She tolerated this very well.      , MD  [facility]

## 2020-04-30 ENCOUNTER — Telehealth: Payer: No Typology Code available for payment source

## 2020-04-30 ENCOUNTER — Telehealth: Payer: Self-pay | Admitting: Licensed Clinical Social Worker

## 2020-04-30 NOTE — Telephone Encounter (Signed)
  Chronic Care Management    Clinical Social Work General Follow Up Note  04/30/2020 Name: Katelyn Whitehead MRN: [id number] DOB: May 27, 1991  Katelyn Whitehead is a 29 y.o. year old female who is a primary care patient of Valentino Nose, NP. The CCM team was consulted for assistance with Mental Health Counseling and Resources.   Review of patient status, including review of consultants reports, relevant laboratory and other test results, and collaboration with appropriate care team members and the patient's provider was performed as part of comprehensive patient evaluation and provision of chronic care management services.    LCSW completed CCM outreach attempt today but was unable to reach patient successfully. A HIPPA compliant voice message was left encouraging patient to return call once available. LCSW will ask Scheduling Care Guide to reschedule CCM SW appointment with patient as well.   Outpatient Encounter Medications as of 04/30/2020  Medication Sig  . acetaminophen (TYLENOL) 500 MG tablet Take 500 mg by mouth every 6 (six) hours as needed.  Marland Kitchen albuterol (VENTOLIN HFA) 108 (90 Base) MCG/ACT inhaler TAKE 2 PUFFS BY MOUTH EVERY 6 HOURS AS NEEDED FOR WHEEZE OR SHORTNESS OF BREATH  . diphenhydrAMINE HCl (BENADRYL ALLERGY PO) Take by mouth.  . etonogestrel-ethinyl estradiol (NUVARING) 0.12-0.015 MG/24HR vaginal ring Insert vaginally and leave in place for 3 consecutive weeks, then remove for 1 week.  Marland Kitchen LORazepam (ATIVAN) 0.5 MG tablet Take by mouth.  . sertraline (ZOLOFT) 50 MG tablet 75 mg.    No facility-administered encounter medications on file as of 04/30/2020.    Follow Up Plan: Scheduling Care Guide will reach out to patient to reschedule appointment.   Dickie La, BSW, MSW, LCSW [facility] [place]  [facility] [email or web address] Phone: [phone]

## 2020-05-07 ENCOUNTER — Encounter: Payer: Self-pay | Admitting: Nurse Practitioner

## 2020-05-08 ENCOUNTER — Telehealth: Payer: Self-pay

## 2020-05-08 NOTE — Chronic Care Management (AMB) (Signed)
°  Care Management   Note  05/08/2020 Name: Katelyn Whitehead MRN: [id number] DOB: [birth year]  Katelyn Whitehead is a 29 y.o. year old female who is a primary care patient of Valentino Nose, NP and is actively engaged with the care management team. I reached out to [name] by phone today to assist with re-scheduling an initial visit with the Licensed Clinical Social Worker  Follow up plan: Unsuccessful telephone outreach attempt made. A HIPAA compliant phone message was left for the patient providing contact information and requesting a return call.  The care management team will reach out to the patient again over the next 7 days.  If patient returns call to provider office, please advise to call Embedded Care Management Care Guide Penne Lash  at [phone]  Penne Lash, RMA Care Guide, Embedded Care Coordination [facility]  [place], [place] [address] Direct Dial: [phone] [email or web address] Website: [email or web address]

## 2020-05-09 NOTE — Telephone Encounter (Signed)
Please schedule virtual 

## 2020-05-14 NOTE — Chronic Care Management (AMB) (Signed)
°  Care Management   Note  05/14/2020 Name: Katelyn Whitehead MRN: [id number] DOB: Dec 06, 1990  DELLE ANDRZEJEWSKI is a 29 y.o. year old female who is a primary care patient of Valentino Nose, NP and is actively engaged with the care management team. I reached out to [name] by phone today to assist with re-scheduling an initial visit with the Licensed Clinical Social Worker  Follow up plan: Unsuccessful telephone outreach attempt made. The care management team will reach out to the patient again over the next 7 days.  If patient returns call to provider office, please advise to call Embedded Care Management Care Guide Penne Lash  at [phone]  Penne Lash, RMA Care Guide, Embedded Care Coordination [facility]  [place], [place] [address] Direct Dial: [phone] [email or web address] Website: [email or web address]

## 2020-05-26 NOTE — Chronic Care Management (AMB) (Signed)
  Care Management   Note  05/26/2020 Name: Katelyn Whitehead MRN: [id number] DOB: 1990/11/14  Katelyn Whitehead is a 29 y.o. year old female who is a primary care patient of Valentino Nose, NP and is actively engaged with the care management team. I reached out to [name] by phone today to assist with re-scheduling an initial visit with the Licensed Clinical Social Worker  Follow up plan: Unable to make contact on outreach attempts x 3. PCP Cathlean Marseilles, NP notified via routed documentation in medical record.   Penne Lash, RMA Care Guide, Embedded Care Coordination [facility]  [place], [place] [address] Direct Dial: [phone] [email or web address] Website: [email or web address]

## 2020-06-14 ENCOUNTER — Other Ambulatory Visit: Payer: Self-pay | Admitting: Nurse Practitioner

## 2020-06-14 NOTE — Telephone Encounter (Signed)
Requested medications are due for refill today Too early  Requested medications are on the active medication list yes  Last refill 10/15  Last visit Do not see this med/dx addressed in visit  Notes to clinic Asking for inhaler  too soon, also do not  see dx addressed in office visit, please assess.

## 2020-06-16 NOTE — Telephone Encounter (Signed)
Please call patient and ask if she is still using this medication and if she needs a refill of it.

## 2020-06-17 NOTE — Telephone Encounter (Signed)
Called and LVM asking for patient to please return my call.  

## 2020-06-19 ENCOUNTER — Other Ambulatory Visit: Payer: Self-pay

## 2020-06-19 ENCOUNTER — Emergency Department: Payer: No Typology Code available for payment source

## 2020-06-19 ENCOUNTER — Emergency Department
Admission: EM | Admit: 2020-06-19 | Discharge: 2020-06-19 | Disposition: A | Discharge status: Home / Self Care | Payer: No Typology Code available for payment source | Attending: Emergency Medicine | Admitting: Emergency Medicine

## 2020-06-19 DIAGNOSIS — Z8616 Personal history of COVID-19: Secondary | ICD-10-CM | POA: Insufficient documentation

## 2020-06-19 DIAGNOSIS — R002 Palpitations: Secondary | ICD-10-CM | POA: Insufficient documentation

## 2020-06-19 DIAGNOSIS — J45909 Unspecified asthma, uncomplicated: Secondary | ICD-10-CM | POA: Diagnosis not present

## 2020-06-19 LAB — CBC
HCT: 41.7 % (ref 36.0–46.0)
Hemoglobin: 14.3 g/dL (ref 12.0–15.0)
MCH: 28 pg (ref 26.0–34.0)
MCHC: 34.3 g/dL (ref 30.0–36.0)
MCV: 81.6 fL (ref 80.0–100.0)
Platelets: 363 10*3/uL (ref 150–400)
RBC: 5.11 MIL/uL (ref 3.87–5.11)
RDW: 12.5 % (ref 11.5–15.5)
WBC: 6.5 10*3/uL (ref 4.0–10.5)
nRBC: 0 % (ref 0.0–0.2)

## 2020-06-19 LAB — BASIC METABOLIC PANEL
Anion gap: 8 (ref 5–15)
BUN: 13 mg/dL (ref 6–20)
CO2: 24 mmol/L (ref 22–32)
Calcium: 9 mg/dL (ref 8.9–10.3)
Chloride: 105 mmol/L (ref 98–111)
Creatinine, Ser: 0.72 mg/dL (ref 0.44–1.00)
GFR, Estimated: >60 mL/min (ref >60)
Glucose, Bld: 118 mg/dL — ABNORMAL HIGH (ref 70–99)
Potassium: 3.4 mmol/L — ABNORMAL LOW (ref 3.5–5.1)
Sodium: 137 mmol/L (ref 135–145)

## 2020-06-19 LAB — TROPONIN I (HIGH SENSITIVITY)
Troponin I (High Sensitivity): <2 ng/L (ref <18)
Troponin I (High Sensitivity): 3 ng/L (ref <18)
Troponin I (High Sensitivity): <2 ng/L (ref <18)

## 2020-06-19 LAB — POC URINE PREG, ED: Preg Test, Ur: NEGATIVE

## 2020-06-19 MED ORDER — SODIUM CHLORIDE 0.9 % IV BOLUS
1000.0000 mL | Freq: Once | INTRAVENOUS | Status: AC
  Administered 2020-06-19: 1000 mL via INTRAVENOUS

## 2020-06-19 NOTE — Discharge Instructions (Signed)
Please return for any further episodes of palpitation with a heart rate over 120.  Please follow-up with Dr. Mariah Milling your cardiologist as planned.  Give his office a call in the morning and let him know you are in the ER and you have had a heart rate that she counted up to 170 at home.  If the numbness and tingling in the legs continue I would also follow-up with neurology.  You can have your primary care doctor or Dr. Mariah Milling refer you or you can use this as a referral to either Dr. Malvin Johns or Dr. Sherryll Burger.  I will include there contact information in the follow-up section.

## 2020-06-19 NOTE — ED Provider Notes (Signed)
[facility] Emergency Department Provider Note   ____________________________________________   First MD Initiated Contact with Patient 06/19/20 1119     (approximate)  I have reviewed the triage vital signs and the nursing notes.   HISTORY  Chief Complaint Palpitations    HPI Katelyn Whitehead is a 29 y.o. female who reports she got her second Covid shot a couple days ago.  After that she woke up with a heart rate of 170 then went back down slowly she has had a couple episodes of heart racing since then.  She took some an antianxiety medicine initially but that did not seem to help.  She reports the first time she had Covid vaccine the same symptoms happen but she did not see anybody.  Patient also reports she has had episodes of walking down the hallway and feeling like she is veering off to 1 side.  There is has been going on off and on for a while prior to the Covid vaccine.  Additionally occasionally she will have numbness or tingling of the legs with burning in the feet the symptoms also come and go.  Patient's not having any current chest pain pleuritic or otherwise and she is not short of breath.         Past Medical History:  Diagnosis Date   ADHD (attention deficit hyperactivity disorder)    Anxiety    Asthma    childhood   Depression    Mild mitral and aortic regurgitation    a. 04/2013 Echo: EF 50-55%, mild AI, mild MR, no MVP.   Mitral valve prolapse    Palpitations    a. 2012 Holter: sinus tach, pvc's.   Panic disorder    Urticaria     Patient Active Problem List   Diagnosis Date Noted   Encounter for allergy testing 04/01/2020   Herpes zoster without complication 12/20/2019   Irritant contact dermatitis due to plants, except food 12/20/2019   COVID-19 12/03/2019   Mixed obsessional thoughts and acts 09/01/2018   Mild mitral and aortic regurgitation    Panic disorder    Depression, major, recurrent, moderate (HCC)  12/04/2014    Past Surgical History:  Procedure Laterality Date   IUD REMOVAL N/A 11/22/2016   Procedure: LAPAROCOPIC INTRAUTERINE DEVICE (IUD) REMOVAL;  Surgeon: Linzie Collinavid James Evans, MD;  Location: [facility] ORS;  Service: Gynecology;  Laterality: N/A;   TYMPANOSTOMY TUBE PLACEMENT     WISDOM TOOTH EXTRACTION      Prior to Admission medications   Medication Sig Start Date End Date Taking? Authorizing Provider  acetaminophen (TYLENOL) 500 MG tablet Take 500 mg by mouth every 6 (six) hours as needed.    [provider]  albuterol (VENTOLIN HFA) 108 (90 Base) MCG/ACT inhaler TAKE 2 PUFFS BY MOUTH EVERY 6 HOURS AS NEEDED FOR WHEEZE OR SHORTNESS OF BREATH 04/13/20   Cannady, Jolene T, NP  diphenhydrAMINE HCl (BENADRYL ALLERGY PO) Take by mouth.    [provider]  etonogestrel-ethinyl estradiol (NUVARING) 0.12-0.015 MG/24HR vaginal ring Insert vaginally and leave in place for 3 consecutive weeks, then remove for 1 week. 09/10/19   Valentino NoseMartinez, Jessica A, NP  LORazepam (ATIVAN) 0.5 MG tablet Take by mouth. 03/02/18   [provider]  sertraline (ZOLOFT) 50 MG tablet 75 mg.  11/20/19   [provider]    Allergies Influenza vaccines and Sulfa antibiotics  Family History  Problem Relation Age of Onset   Depression Mother    Diabetes Mother  type 2   Hyperlipidemia Mother    Hypertension Mother    Melanoma Mother        skin   Asthma Mother    Allergic rhinitis Mother    Drug abuse Father    Gout Father    Depression Father    Psychiatric Illness Father        PTSD   Allergic rhinitis Father    Urticaria Father    Allergic rhinitis Sister    ADD / ADHD Brother    Depression Maternal Aunt    Dementia Maternal Grandmother    Depression Maternal Grandmother    Heart disease Maternal Grandfather    Diabetes Paternal Grandmother        type 2   Hypertension Paternal Grandmother     Social History Social History   Tobacco Use     Smoking status: Never Smoker   Smokeless tobacco: Never Used  Building services engineer Use: Never used  Substance Use Topics   Alcohol use: No    Alcohol/week: 0.0 standard drinks   Drug use: No    Review of Systems  Constitutional: No fever/chills Eyes: No visual changes. ENT: No sore throat. Cardiovascular: Currently denies chest pain. Respiratory: Denies shortness of breath. Gastrointestinal: No abdominal pain.  No nausea, no vomiting.  No diarrhea.  No constipation. Genitourinary: Negative for dysuria. Musculoskeletal: Negative for back pain. Skin: Negative for rash. Neurological: Negative for headaches, focal weakness   ____________________________________________   PHYSICAL EXAM:  VITAL SIGNS: ED Triage Vitals  Enc Vitals Group     BP 06/19/20 0942 (!) 147/90     Pulse Rate 06/19/20 0942 100     Resp 06/19/20 0942 18     Temp 06/19/20 0942 98.6 F (37 C)     Temp Source 06/19/20 0942 Oral     SpO2 06/19/20 0942 100 %     Weight 06/19/20 0943 159 lb (72.1 kg)     Height 06/19/20 0943 5\' 2"  (1.575 m)     Head Circumference --      Peak Flow --      Pain Score 06/19/20 0943 4     Pain Loc --      Pain Edu? --      Excl. in GC? --     Constitutional: Alert and oriented. Well appearing and in no acute distress. Eyes: Conjunctivae are normal. PER. EOMI. Head: Atraumatic. Nose: No congestion/rhinnorhea. Mouth/Throat: Mucous membranes are moist.  Oropharynx non-erythematous. Neck: No stridor.   Cardiovascular: Normal rate, regular rhythm. Grossly normal heart sounds.  Good peripheral circulation. Respiratory: Normal respiratory effort.  No retractions. Lungs CTAB. Gastrointestinal: Soft and nontender. No distention. No abdominal bruits. No CVA tenderness. Musculoskeletal: No lower extremity tenderness nor edema.   Neurologic:  Normal speech and language. No gross focal neurologic deficits are appreciated.  Cranial nerves II through XII are intact although  visual fields were not checked motor strength is 5/5 throughout sensation is intact currently cerebellar finger-nose rapid alternating movements in the hands are normal patient is able to bring her leg up and touch my hand without any difficulty. Skin:  Skin is warm, dry and intact. No rash noted. Psychiatric: Mood and affect are normal. Speech and behavior are normal.  ____________________________________________   LABS (all labs ordered are listed, but only abnormal results are displayed)  Labs Reviewed  BASIC METABOLIC PANEL - Abnormal; Notable for the following components:      Result Value   Potassium 3.4 (*)  Glucose, Bld 118 (*)    All other components within normal limits  CBC  POC URINE PREG, ED  TROPONIN I (HIGH SENSITIVITY)  TROPONIN I (HIGH SENSITIVITY)  TROPONIN I (HIGH SENSITIVITY)  TROPONIN I (HIGH SENSITIVITY)   ____________________________________________  EKG  EKG read interpreted by me shows heart rate of 106 right axis no acute changes Monitor showing a heart rate of 88 occasionally will go up into the 90s but then comes down again.  EKG #2 read interpreted by me shows normal sinus rhythm rate of 93 rightward axis no acute ST-T wave changes very similar to last EKG in both of these are also similar to EKG from August. ____________________________________________  RADIOLOGY Jill Poling, personally viewed and evaluated these images (plain radiographs) as part of my medical decision making, as well as reviewing the written report by the radiologist.  ED MD interpretation: Chest x-ray read by radiology reviewed by me is negative  Official radiology report(s): DG Chest 2 View  Result Date: 06/19/2020 CLINICAL DATA:  Chest pain, palpitations and tachycardia. EXAM: CHEST - 2 VIEW COMPARISON:  12/11/2019 FINDINGS: The heart size and mediastinal contours are within normal limits. There is no evidence of pulmonary edema, consolidation, pneumothorax, nodule  or pleural fluid. The visualized skeletal structures are unremarkable. IMPRESSION: No active cardiopulmonary disease. Electronically Signed   By: Irish Lack M.D.   On: 06/19/2020 10:19   MR BRAIN WO CONTRAST  Result Date: 06/19/2020 CLINICAL DATA:  Dizziness, persistent/recurrent, cardiac or vascular cause suspected. EXAM: MRI HEAD WITHOUT CONTRAST TECHNIQUE: Multiplanar, multiecho pulse sequences of the brain and surrounding structures were obtained without intravenous contrast. COMPARISON:  MRI of the brain November 12, 2016. FINDINGS: Brain: No acute infarction, hemorrhage, hydrocephalus, extra-axial collection or mass lesion. The brain parenchyma has normal morphology and signal characteristics. Vascular: Normal flow voids. Skull and upper cervical spine: Normal marrow signal. Sinuses/Orbits: Negative. Other: None. IMPRESSION: No acute intracranial abnormality identified. Unremarkable MRI of the brain. Electronically Signed   By: Baldemar Lenis M.D.   On: 06/19/2020 14:13    ____________________________________________   PROCEDURES  Procedure(s) performed (including Critical Care):  Procedures   ____________________________________________   INITIAL IMPRESSION / ASSESSMENT AND PLAN / ED COURSE  Patient is concerned about possible myocarditis.  I reassured her that the initial blood work and EKG are okay.  I will repeat the blood work and EKG the troponin and EKG that is just to make sure.  Additionally I will order an MRI of her brain just to make sure that there is no MS or anything similar going on.  She is at the proper age for developing this problem.    ----------------------------------------- 4:13 PM on 06/19/2020 -----------------------------------------  Patient's troponins are negative there was a brief slight bump that was insignificant but returned negative completely.  MRI is negative.  Patient has a history which could be consistent with SVT or possibly  some unusual reaction to the Covid vaccine.  The MRIs not consistent with any intracranial process.  If her symptoms continue I have advised her to follow-up with neurology.  She still could have the possibility of some spinal MS or something.  This is less likely because it would not explain the leaning to one side or tendency to walk to one side or the other.  Patient is young and healthy and should not have any other sort of cardiac disease.  Additionally her troponin and EKG have been normal.  I will have  her follow-up with her cardiologist just for completeness.  Symptoms could also be explained by anxiety although anxiety would not give her heart rate of 170.         ____________________________________________   FINAL CLINICAL IMPRESSION(S) / ED DIAGNOSES  Final diagnoses:  Palpitations     ED Discharge Orders    None      *Please note:  Katelyn Whitehead was evaluated in Emergency Department on 06/19/2020 for the symptoms described in the history of present illness. She was evaluated in the context of the global COVID-19 pandemic, which necessitated consideration that the patient might be at risk for infection with the SARS-CoV-2 virus that causes COVID-19. Institutional protocols and algorithms that pertain to the evaluation of patients at risk for COVID-19 are in a state of rapid change based on information released by regulatory bodies including the CDC and federal and state organizations. These policies and algorithms were followed during the patient's care in the ED.  Some ED evaluations and interventions may be delayed as a result of limited staffing during and the pandemic.*   Note:  This document was prepared using Dragon voice recognition software and may include unintentional dictation errors.    Arnaldo Natal, MD 06/19/20 [phone]

## 2020-06-19 NOTE — ED Triage Notes (Signed)
Pt arrives from pre admit testing at [facility]. Pt reports feeling like heart is racing. Reports waking up a couple nights ago with hr of 170, since then she reports having several occasions feeling like heart racing. Reports HR prior to arriving in ed was 145. Pt hx of anxiety but denies any recent triggers. States "grabbing" pain in center chest. Reports taking .25mg  of po ativan prior to ED arrival. NAD noted at this time and ambulatory in triage. Denies SOB at this time.

## 2020-06-19 NOTE — ED Notes (Signed)
Pt presents to ED with c/o of having palpitations that have been ongoing intermittently since the day after her second COVID vaccination. Pt states a HX of anxiety but states "this is not the same", pt states taking .25mg  of PO ativan that is RX'ed to her but states there was no change. Pt states recent HR of 170's at rest, pt states this has been happening at rest. Pt states chest pain is more like a pressure. Pt denies SOB. Pt is A&Ox4. Pt denies any other complaints at this time.

## 2020-06-20 ENCOUNTER — Ambulatory Visit (INDEPENDENT_AMBULATORY_CARE_PROVIDER_SITE_OTHER): Payer: No Typology Code available for payment source | Admitting: Family

## 2020-06-20 ENCOUNTER — Encounter: Payer: Self-pay | Admitting: Family

## 2020-06-20 ENCOUNTER — Other Ambulatory Visit: Payer: Self-pay | Admitting: Family

## 2020-06-20 VITALS — BP 110/78 | HR 116 | Ht 62.0 in | Wt 163.0 lb

## 2020-06-20 DIAGNOSIS — R002 Palpitations: Secondary | ICD-10-CM | POA: Diagnosis not present

## 2020-06-20 DIAGNOSIS — R0789 Other chest pain: Secondary | ICD-10-CM

## 2020-06-20 DIAGNOSIS — R000 Tachycardia, unspecified: Secondary | ICD-10-CM | POA: Diagnosis not present

## 2020-06-20 DIAGNOSIS — R0600 Dyspnea, unspecified: Secondary | ICD-10-CM | POA: Diagnosis not present

## 2020-06-20 DIAGNOSIS — I080 Rheumatic disorders of both mitral and aortic valves: Secondary | ICD-10-CM

## 2020-06-20 DIAGNOSIS — E876 Hypokalemia: Secondary | ICD-10-CM

## 2020-06-20 MED ORDER — PROPRANOLOL HCL 20 MG PO TABS: 20.0000 mg | ORAL_TABLET | Freq: Two times a day (BID) | ORAL | 2 refills | Status: DC | PRN

## 2020-06-20 NOTE — Patient Instructions (Addendum)
Medication Instructions:  Your physician has recommended you make the following change in your medication:   START Propranolol 20mg  as needed for palpitations  *If you need a refill on your cardiac medications before your next appointment, please call your pharmacy*  Lab Work: None ordered today  Testing/Procedures: Your physician has requested that you have an echocardiogram. Echocardiography is a painless test that uses sound waves to create images of your heart. It provides your doctor with information about the size and shape of your heart and how well your heart's chambers and valves are working. This procedure takes approximately one hour. There are no restrictions for this procedure.  Follow-Up: At [facility], you and your health needs are our priority.  As part of our continuing mission to provide you with exceptional heart care, we have created designated Provider Care Teams.  These Care Teams include your primary Cardiologist (physician) and Advanced Practice Providers (APPs -  Physician Assistants and Nurse Practitioners) who all work together to provide you with the care you need, when you need it.  We recommend signing up for the patient portal called "MyChart".  Sign up information is provided on this After Visit Summary.  MyChart is used to connect with patients for Virtual Visits (Telemedicine).  Patients are able to view lab/test results, encounter notes, upcoming appointments, etc.  Non-urgent messages can be sent to your provider as well.   To learn more about what you can do with MyChart, go to [facility].    Your next appointment:   2 month(s)  The format for your next appointment:   In Person  Provider:   You may see [email or web address], MD or one of the following Advanced Practice Providers on your designated Care Team:    Julien Nordmann, NP  Nicolasa Ducking, PA-C  Eula Listen, PA-C  Cadence Marisue Ivan, Fransico Michael  [place], NP  Other  Instructions   Recommend trying to drink something with electrolytes to help stay well hydrated.   Kardia for monitoring of EKGs at home: You can look into the [facility] device by [facility].This device is purchased by you and it connects to an application you download to your smart phone.  It can detect abnormal heart rhythms and alert you to contact your doctor for further evaluation. The device is approximately $90 and the phone application is free.  The web site is:  https://www.alivecor.com    Supraventricular Tachycardia, Adult Supraventricular tachycardia (SVT) is a kind of abnormal heartbeat. It makes your heart beat very fast and then beat at a normal speed. A normal resting heartbeat is 60-100 times a minute. This condition can make your heart beat more than 150 times a minute. Times of having a fast heartbeat (episodes) can be scary, but they are usually not dangerous. In some cases, they may lead to heart failure if:  They happen many times per day.  Last longer than a few seconds. What are the causes?  A normal heartbeat starts when an area called the sinoatrial node sends out an electrical signal. In SVT, other areas of the heart send out signals that get in the way of the signal from the sinoatrial node. What increases the risk? You are more likely to develop this condition if you are:  10-57 years old.  A woman. The following factors may make you more likely to develop this condition:  Stress.  Tiredness.  Smoking.  Stimulant drugs, such as cocaine and methamphetamine.  Alcohol.  Caffeine.  Pregnancy.  Feeling  worried or nervous (anxiety). What are the signs or symptoms?  A pounding heart.  A feeling that your heart is skipping beats (palpitations).  Weakness.  Trouble getting enough air.  Pain or tightness in your chest.  Feeling like you are going to pass out (faint).  Feeling worried or nervous.  Dizziness.  Sweating.  Feeling sick  to your stomach (nausea).  Passing out.  Tiredness. Sometimes, there are no symptoms. How is this treated?  Vagal nerve stimulation. Ways to do this include: ? Holding your breath and pushing, as though you are pooping (having a bowel movement). ? Massaging an area on one side of your neck. Do not try this yourself. Only a doctor should do this. If done the wrong way, it can lead to a stroke. ? Bending forward with your head between your legs. ? Coughing while bending forward with your head between your legs. ? Closing your eyes and massaging your eyeballs. Ask a doctor how to do this.  Medicines that prevent attacks.  Medicine to stop an attack given through an IV tube at the hospital.  A small electric shock (cardioversion) that stops an attack.  Radiofrequency ablation. In this procedure, a small, thin tube (catheter) is used to send energy to the area that is causing the rapid heartbeats. If you do not have symptoms, you may not need treatment. Follow these instructions at home: Stress  Avoid things that make you feel stressed.  To deal with stress, try: ? Doing yoga or meditation, or being out in nature. ? Listening to relaxing music. ? Doing deep breathing. ? Taking steps to be healthy, such as getting lots of sleep, exercising, and eating a balanced diet. ? Talking with a mental health doctor. Lifestyle   Try to get at least 7 hours of sleep each night.  Do not use any products that contain nicotine or tobacco, such as cigarettes, e-cigarettes, and chewing tobacco. If you need help quitting, ask your doctor.  Be aware of how alcohol affects you. ? If alcohol gives you a fast heartbeat, do not drink alcohol. ? If alcohol does not seem to give you a fast heartbeat, limit alcohol use to no more than 1 drink a day for women who are not pregnant, and 2 drinks a day for men. In the [place] one drink is one of these:  12 oz of beer (355 mL).  5 oz of wine (148 mL).  1  oz of hard liquor (44 mL).  Be aware of how caffeine affects you. ? If caffeine gives you a fast heartbeat, do not eat, drink, or use anything with caffeine in it. ? If caffeine does not seem to give you a fast heartbeat, limit how much caffeine you eat, drink, or use.  Do not use stimulant drugs. If you need help quitting, ask your doctor. General instructions  Stay at a healthy weight.  Exercise regularly. Ask your doctor about good activities for you. Try one or a mixture of these: ? 150 minutes a week of gentle exercise, like walking or yoga. ? 75 minutes a week of exercise that is very active, like running or swimming.  Do vagus nerve treatments to slow down your heartbeat as told by your doctor.  Take over-the-counter and prescription medicines only as told by your doctor.  Keep all follow-up visits as told by your doctor. This is important. Contact a doctor if:  You have a fast heartbeat more often.  Times of having a fast  heartbeat last longer than before.  Home treatments to slow down your heartbeat do not help.  You have new symptoms. Get help right away if:  You have chest pain.  Your symptoms get worse.  You have trouble breathing.  Your heart beats very fast for more than 20 minutes.  You pass out. These symptoms may be an emergency. Do not wait to see if the symptoms will go away. Get medical help right away. Call your local emergency services (911 in the [place] Do not drive yourself to the hospital. Summary  SVT is a type of abnormal heart beat.  This condition can make your heart beat more than 150 times a minute.  Treatment depends on how often the condition happens and your symptoms. This information is not intended to replace advice given to you by your health care provider. Make sure you discuss any questions you have with your health care provider. Document Revised: 06/13/2018 Document Reviewed: 06/13/2018 Elsevier Patient Education  2020  [employer].

## 2020-06-20 NOTE — Progress Notes (Signed)
Office Visit    Patient Name: Katelyn Whitehead Date of Encounter: 06/20/2020  Primary Care Provider:  Valentino Nose, NP Primary Cardiologist:  Julien Nordmann, MD Electrophysiologist:  None   Chief Complaint    Katelyn Whitehead is a 29 y.o. female with a hx of ADHD nonstimulant, anxiety, panic attacks, palpitations, presyncope felt to be orthostasis in the setting of dehydration presents today for tachycardia  Past Medical History    Past Medical History:  Diagnosis Date  . ADHD (attention deficit hyperactivity disorder)   . Anxiety   . Asthma    childhood  . Depression   . Mild mitral and aortic regurgitation    a. 04/2013 Echo: EF 50-55%, mild AI, mild MR, no MVP.  Marland Kitchen Mitral valve prolapse   . Palpitations    a. 2012 Holter: sinus tach, pvc's.  . Panic disorder   . Urticaria    Past Surgical History:  Procedure Laterality Date  . IUD REMOVAL N/A 11/22/2016   Procedure: LAPAROCOPIC INTRAUTERINE DEVICE (IUD) REMOVAL;  Surgeon: Linzie Collin, MD;  Location: [facility] ORS;  Service: Gynecology;  Laterality: N/A;  . TYMPANOSTOMY TUBE PLACEMENT    . WISDOM TOOTH EXTRACTION      Allergies  Allergies  Allergen Reactions  . Influenza Vaccines Hives  . Sulfa Antibiotics     Hives Rash    History of Present Illness    Katelyn Whitehead is a 29 y.o. female with a hx of  ADHD nonstimulant, anxiety, panic attacks, palpitations, presyncope felt to be orthostasis in the setting of dehydration.  She was last seen in 2018 by Dr. Mariah Milling.  She has chronic baseline elevated heart rate.  She previously wore Holter monitor as an 12 in sinus rhythm and rare PVC.  Seen in clinic 09/2015 with palpitations and wore a 48-hour Holter monitor at the time which showed NSR with average heart rate of 94 bpm, rare PACs, single episode of sinus tachycardia with rate of 156 bpm that did not correlate with any diary entry.  Never diary injuries correlated with significant arrhythmia.  She had  previously reported history of MVP though echo in 2014 in 2017 did not indicate this. Echocardiogram September 2018 with LVEF 55 to 60%, no RWMA, normal diastolic parameters, mild AI/MR.  Her previous tachycardia has been suspected to be triggered by anxiety.  She was seen in the ED yesterday due to self-reported tachycardia after getting her second Covid vaccination a few days prior.  Chest x-ray with no active cardiopulmonary disease.  MRI of the brain unremarkable which was ordered secondary to feeling of veering off to the side when walking. She had HS troponin of <2 ? 3 ? <2. Lab work with K 3.4, GFR >60, creatinine 0.72, Hb 14.3. Previous TSH 09/13/19 of 1.543.  Works as a Lawyer in [employer].  She has a 32-year-old daughter at home.  She got her second Covid vaccination Friday.  On Saturday and Sunday she felt tired.  Monday she started having back pain between her shoulder blades described as sharp that self resolved.  She also noted she was nauseous.  The middle of Monday evening she noticed that her heart rate was elevated.  Tuesday she had multiple episodes of diarrhea.  Tells me this felt different than her previous elevated heart rates.  Tells me she feels tight in her chest as well as her mid back which she describes as a "sharp pain". Has not taken anything for this. Tells  me this happened the other day and it self resolved.    Tells me her heart rate goes up every time she gets up. Reports readings of 135-155 on her Apple Watch when getting up to walk. Previously 110bpm with ambulation  Tells me she is only drinking water. Drinks approximately 6 bottles of water per day. Not drinking Pepsi, but will have occasional Gingerale with dinner.    EKGs/Labs/Other Studies Reviewed:   The following studies were reviewed today:  Echo 04/2017 - Left ventricle: The cavity size was normal. Systolic function was    normal. The estimated ejection fraction was in the range of 55%    to 60%. Wall  motion was normal; there were no regional wall    motion abnormalities. Left ventricular diastolic function    parameters were normal.  - Aortic valve: There was mild regurgitation.  - Mitral valve: There was mild regurgitation.  - Left atrium: The atrium was normal in size.  - Right ventricle: Systolic function was normal.  - Pulmonary arteries: Systolic pressure was within the normal   Holter 06/2016 Holter monitor NSR, periods of sinus tachycardia Rare APCs No significant arrhythmia associated with patient symptoms  EKG: No EKG is  ordered today.  The ekg independently reviewed from the emergency department 06/19/2020 showed sinus tachycardia 106 bpm with no acute ST/T wave changes.  Recent Labs: 09/13/2019: ALT 21; TSH 1.543 06/19/2020: BUN 13; Creatinine, Ser 0.72; Hemoglobin 14.3; Platelets 363; Potassium 3.4; Sodium 137  Recent Lipid Panel    Component Value Date/Time   CHOL 172 09/10/2019 1619   TRIG 79 09/10/2019 1619   HDL 41 09/10/2019 1619   LDLCALC 116 (H) 09/10/2019 1619    Home Medications   Current Meds  Medication Sig  . acetaminophen (TYLENOL) 500 MG tablet Take 500 mg by mouth every 6 (six) hours as needed.  Marland Kitchen albuterol (VENTOLIN HFA) 108 (90 Base) MCG/ACT inhaler TAKE 2 PUFFS BY MOUTH EVERY 6 HOURS AS NEEDED FOR WHEEZE OR SHORTNESS OF BREATH  . diphenhydrAMINE HCl (BENADRYL ALLERGY PO) Take by mouth as needed.   . etonogestrel-ethinyl estradiol (NUVARING) 0.12-0.015 MG/24HR vaginal ring Insert vaginally and leave in place for 3 consecutive weeks, then remove for 1 week.  Marland Kitchen LORazepam (ATIVAN) 0.5 MG tablet Take 0.25 mg by mouth 2 (two) times daily.   . sertraline (ZOLOFT) 50 MG tablet Take 75 mg by mouth daily.      Review of Systems  All other systems reviewed and are otherwise negative except as noted above.  Physical Exam    VS:  BP 110/78 (BP Location: Left Arm, Patient Position: Sitting, Cuff Size: Normal)   Pulse (!) 116   Ht 5\' 2"  (1.575 m)    Wt 163 lb (73.9 kg)   LMP 06/02/2020   SpO2 98%   BMI 29.81 kg/m  , BMI Body mass index is 29.81 kg/m.  Wt Readings from Last 3 Encounters:  06/20/20 163 lb (73.9 kg)  06/19/20 159 lb (72.1 kg)  04/28/20 166 lb (75.3 kg)    GEN: Well nourished, well developed, in no acute distress. HEENT: normal. Neck: Supple, no JVD, carotid bruits, or masses. Cardiac: Tachycardic,, no murmurs, rubs, or gallops. No clubbing, cyanosis, edema.  Radials/DP/PT 2+ and equal bilaterally.  Respiratory:  Respirations regular and unlabored, clear to auscultation bilaterally. GI: Soft, nontender, nondistended. MS: No deformity or atrophy. Skin: Warm and dry, no rash. Neuro:  Strength and sensation are intact. Psych: Normal affect.  Assessment & Plan  1. Tachycardia - Longstanding history of paroxysmal tachycardia.  Previous Holter monitors without acute findings.  Anticipate most recent elevated heart rates are precipitated by volume depletion in the setting of diarrhea, nausea, vomiting.  Also anxiety and stress are likely contributory as well.  We will continue her Ativan as prescribed by her primary care provider.  Provided prescription for propanolol 20 mg twice daily as needed for palpitations.  She had negative pregnancy test yesterday and uses a NuvaRing.  We did discuss that if she does decide to have another child we may need to readdress beta-blocker therapy.  Her symptoms of sudden onset of tachycardia are consistent with a diagnosis of SVT -discussed vagal maneuver and recommend she purchase a Kardia device for monitoring.  2. Chest discomfort- Atypical for angina as it occurs at rest..  EKG in the ED 06/19/2020 with no acute ST/T wave changes.  High-sensitivity troponin negative x3.  No indication for ischemia evaluation at this time.  3. Hypokalemia - 06/19/20 K 3.4 in setting of GI upset, diarrhea, nausea.  Encouraged oral sources of potassium such as electrolyte drinks, banana, cantaloupe.  She  is not on a diuretic.  4. Mild Mitral and Aortic regurgitation -by echo 04/2017.  In the setting of worsening tachycardia plan for repeat echocardiogram.  Disposition: Follow up in 2 month(s) with Dr. Mariah Milling or APP  Signed, Alver Sorrow, NP 06/20/2020, 1:56 PM [facility]

## 2020-06-23 ENCOUNTER — Ambulatory Visit (INDEPENDENT_AMBULATORY_CARE_PROVIDER_SITE_OTHER): Payer: No Typology Code available for payment source

## 2020-06-23 ENCOUNTER — Other Ambulatory Visit: Payer: Self-pay

## 2020-06-23 DIAGNOSIS — R0600 Dyspnea, unspecified: Secondary | ICD-10-CM | POA: Diagnosis not present

## 2020-06-23 DIAGNOSIS — R002 Palpitations: Secondary | ICD-10-CM | POA: Diagnosis not present

## 2020-06-23 LAB — ECHOCARDIOGRAM COMPLETE
Area-P 1/2: 5.38 cm2
Calc EF: 60.7 %
P 1/2 time: 681 msec
S' Lateral: 2.9 cm
Single Plane A2C EF: 59.4 %
Single Plane A4C EF: 61.3 %

## 2020-07-02 ENCOUNTER — Other Ambulatory Visit: Payer: Self-pay

## 2020-08-09 DIAGNOSIS — U071 COVID-19: Secondary | ICD-10-CM

## 2020-08-09 HISTORY — DX: COVID-19: U07.1

## 2020-08-20 ENCOUNTER — Encounter: Payer: Self-pay | Admitting: Nurse Practitioner

## 2020-08-20 ENCOUNTER — Other Ambulatory Visit: Payer: Self-pay

## 2020-08-20 ENCOUNTER — Other Ambulatory Visit: Payer: Self-pay | Admitting: Nurse Practitioner

## 2020-08-20 ENCOUNTER — Ambulatory Visit (INDEPENDENT_AMBULATORY_CARE_PROVIDER_SITE_OTHER): Payer: No Typology Code available for payment source | Admitting: Nurse Practitioner

## 2020-08-20 ENCOUNTER — Ambulatory Visit: Payer: No Typology Code available for payment source | Admitting: Family

## 2020-08-20 VITALS — BP 103/71 | HR 78 | Temp 98.9°F | Ht 62.13 in | Wt 164.8 lb

## 2020-08-20 DIAGNOSIS — Z1159 Encounter for screening for other viral diseases: Secondary | ICD-10-CM | POA: Diagnosis not present

## 2020-08-20 DIAGNOSIS — M2559 Pain in other specified joint: Secondary | ICD-10-CM

## 2020-08-20 DIAGNOSIS — R21 Rash and other nonspecific skin eruption: Secondary | ICD-10-CM | POA: Diagnosis not present

## 2020-08-20 MED ORDER — TRIAMCINOLONE ACETONIDE 0.1 % EX CREA: 1.0000 "application " | TOPICAL_CREAM | Freq: Two times a day (BID) | CUTANEOUS | 2 refills | Status: DC

## 2020-08-20 MED ORDER — PREDNISONE 10 MG PO TABS: ORAL_TABLET | ORAL | 0 refills | Status: DC

## 2020-08-20 NOTE — Progress Notes (Signed)
BP 103/71   Pulse 78   Temp 98.9 F (37.2 C) (Oral)   Ht 5' 2.13" (1.578 m)   Wt 164 lb 12.8 oz (74.8 kg)   LMP 08/04/2020   SpO2 97%   BMI 30.02 kg/m    Subjective:    Patient ID: Katelyn Whitehead, female    DOB: 12/17/1990, 30 y.o.   MRN: [id number]  HPI: Katelyn Whitehead is a 30 y.o. female  Chief Complaint  Patient presents with  . Rash    Since Sunday, on right forearm now is all over body, very itchy. Has been using oatmeal bath, OTC benadryl and Claritin. Happened about 5 months ago on legs but cleared on it own. Patient states that she had Covid in April and again in Sept.  Patient states that she has changed her diet and eliminated soda and eating more vegetables and fruits. Patient states that she is feeling swollen.   RASH Started to right forearm on Sunday and then progressed to all over body.  Had similar presentation 5 months ago, lasted about a week at time and went away on own.  Attempting OTC Benadryl and Claritin.  Has history of Covid x 2, April 2021 and September 2021.  Has eliminated soda and eating more vegetables and fruit.  She was diagnosed 2 years ago at dermatology with lichen planus.  Had allergy testing for Covid vaccine past and animals, but not tested for animals.  Does endorse occasional joint pain hands -- with swelling in hands in morning and feet at times. Duration:  days  Location: arms and abdomen, back, under arms, and legs Itching: yes Burning: a little  Redness: yes Oozing: no Scaling: no Blisters: no Painful: no Fevers: no Change in detergents/soaps/personal care products: no Recent illness: no Recent travel:no History of same: yes Context: stable Alleviating factors: benadryl Treatments attempted:benadryl and Claritin Shortness of breath: no  Throat/tongue swelling: no Myalgias/arthralgias: no  Relevant past medical, surgical, family and social history reviewed and updated as indicated. Interim medical history since our last  visit reviewed. Allergies and medications reviewed and updated.  Review of Systems  Constitutional: Negative for activity change, appetite change, diaphoresis, fatigue and fever.  Respiratory: Negative for cough, chest tightness and shortness of breath.   Cardiovascular: Negative for chest pain, palpitations and leg swelling.  Endocrine: Negative.   Musculoskeletal: Positive for arthralgias.  Skin: Positive for rash.  Neurological: Negative.   Psychiatric/Behavioral: Negative.     Per HPI unless specifically indicated above     Objective:    BP 103/71   Pulse 78   Temp 98.9 F (37.2 C) (Oral)   Ht 5' 2.13" (1.578 m)   Wt 164 lb 12.8 oz (74.8 kg)   LMP 08/04/2020   SpO2 97%   BMI 30.02 kg/m   Wt Readings from Last 3 Encounters:  08/20/20 164 lb 12.8 oz (74.8 kg)  06/20/20 163 lb (73.9 kg)  06/19/20 159 lb (72.1 kg)    Physical Exam Vitals and nursing note reviewed.  Constitutional:      General: She is awake. She is not in acute distress.    Appearance: She is well-developed and well-groomed. She is obese. She is not ill-appearing.  HENT:     Head: Normocephalic.     Right Ear: Hearing normal.     Left Ear: Hearing normal.  Eyes:     General: Lids are normal.        Right eye: No discharge.  Left eye: No discharge.     Conjunctiva/sclera: Conjunctivae normal.     Pupils: Pupils are equal, round, and reactive to light.  Neck:     Thyroid: No thyromegaly.     Vascular: No carotid bruit.  Cardiovascular:     Rate and Rhythm: Normal rate and regular rhythm.     Heart sounds: Normal heart sounds. No murmur heard. No gallop.   Pulmonary:     Effort: Pulmonary effort is normal. No accessory muscle usage or respiratory distress.     Breath sounds: Normal breath sounds.  Abdominal:     General: Bowel sounds are normal.     Palpations: Abdomen is soft.  Musculoskeletal:     Cervical back: Normal range of motion and neck supple.     Right lower leg: No edema.      Left lower leg: No edema.  Skin:    General: Skin is warm and dry.     Findings: Rash present. Rash is macular and papular.     Comments: Scattered macular/papular rash present, significant to abdominal area and upper thighs.  Small patches to bilateral forearms and back.  Areas with moderate erythema, no vesicles.  Scattered patches.  No scaling.  Neurological:     Mental Status: She is alert and oriented to person, place, and time.  Psychiatric:        Attention and Perception: Attention normal.        Mood and Affect: Mood normal.        Speech: Speech normal.        Behavior: Behavior normal. Behavior is cooperative.        Thought Content: Thought content normal.    Results for orders placed or performed in visit on 06/23/20  ECHOCARDIOGRAM COMPLETE  Result Value Ref Range   S' Lateral 2.90 cm   Area-P 1/2 5.38 cm2   Single Plane A4C EF 61.3 %   Single Plane A2C EF 59.4 %   Calc EF 60.7 %   P 1/2 time 681 msec      Assessment & Plan:   Problem List Items Addressed This Visit      Musculoskeletal and Integument   Rash - Primary    Acute x 4 days -- history of diagnosis of lichen planus in past.  She reports this is second x this particular rash has presented, only worse this time.  ?lichen planus vs allergic rash.  Has history of Covid x 2 and rashes more prevalent since this.  At this time will treat both externally and systemically.  Prednisone taper and Triamcinolone cream sent to pharmacy.  Recommend she continue daily Claritin in morning and Benadryl at night for comfort.  Oatmeal baths.  Return to office in 2 weeks for recheck, if ongoing send to dermatology for recommendations.        Other   Joint pain    Ongoing with current rash episodes.  At this time will check CBC, CRP, ESR, BMP, ANA, uric acid level to assess further for underlying inflammatory autoimmune condition.  If abnormal labs consider referral to rheumatology.      Relevant Orders   ANA  w/Reflex if Positive   CBC with Differential/Platelet   Basic metabolic panel   C-reactive protein   Sed Rate (ESR)   Uric acid    Other Visit Diagnoses    Need for hepatitis C screening test       Hep C screening today.   Relevant Orders  Hepatitis C Antibody       Follow up plan: Return in about 2 weeks (around 09/03/2020) for Rash follow-up.

## 2020-08-20 NOTE — Assessment & Plan Note (Signed)
Ongoing with current rash episodes.  At this time will check CBC, CRP, ESR, BMP, ANA, uric acid level to assess further for underlying inflammatory autoimmune condition.  If abnormal labs consider referral to rheumatology.

## 2020-08-20 NOTE — Assessment & Plan Note (Signed)
Acute x 4 days -- history of diagnosis of lichen planus in past.  She reports this is second x this particular rash has presented, only worse this time.  ?lichen planus vs allergic rash.  Has history of Covid x 2 and rashes more prevalent since this.  At this time will treat both externally and systemically.  Prednisone taper and Triamcinolone cream sent to pharmacy.  Recommend she continue daily Claritin in morning and Benadryl at night for comfort.  Oatmeal baths.  Return to office in 2 weeks for recheck, if ongoing send to dermatology for recommendations.

## 2020-08-20 NOTE — Patient Instructions (Signed)
Rash, Adult  A rash is a change in the color of your skin. A rash can also change the way your skin feels. There are many different conditions and factors that can cause a rash. Follow these instructions at home: The goal of treatment is to stop the itching and keep the rash from spreading. Watch for any changes in your symptoms. Let your doctor know about them. Follow these instructions to help with your condition: Medicine Take or apply over-the-counter and prescription medicines only as told by your doctor. These may include medicines:  To treat red or swollen skin (corticosteroid creams).  To treat itching.  To treat an allergy (oral antihistamines).  To treat very bad symptoms (oral corticosteroids).   Skin care  Put cool cloths (compresses) on the affected areas.  Do not scratch or rub your skin.  Avoid covering the rash. Make sure that the rash is exposed to air as much as possible. Managing itching and discomfort  Avoid hot showers or baths. These can make itching worse. A cold shower may help.  Try taking a bath with: ? Epsom salts. You can get these at your local pharmacy or grocery store. Follow the instructions on the package. ? Baking soda. Pour a small amount into the bath as told by your doctor. ? Colloidal oatmeal. You can get this at your local pharmacy or grocery store. Follow the instructions on the package.  Try putting baking soda paste onto your skin. Stir water into baking soda until it gets like a paste.  Try putting on a lotion that relieves itchiness (calamine lotion).  Keep cool and out of the sun. Sweating and being hot can make itching worse. General instructions  Rest as needed.  Drink enough fluid to keep your pee (urine) pale yellow.  Wear loose-fitting clothing.  Avoid scented soaps, detergents, and perfumes. Use gentle soaps, detergents, perfumes, and other cosmetic products.  Avoid anything that causes your rash. Keep a journal to help  track what causes your rash. Write down: ? What you eat. ? What cosmetic products you use. ? What you drink. ? What you wear. This includes jewelry.  Keep all follow-up visits as told by your doctor. This is important.   Contact a doctor if:  You sweat at night.  You lose weight.  You pee (urinate) more than normal.  You pee less than normal, or you notice that your pee is a darker color than normal.  You feel weak.  You throw up (vomit).  Your skin or the whites of your eyes look yellow (jaundice).  Your skin: ? Tingles. ? Is numb.  Your rash: ? Does not go away after a few days. ? Gets worse.  You are: ? More thirsty than normal. ? More tired than normal.  You have: ? New symptoms. ? Pain in your belly (abdomen). ? A fever. ? Watery poop (diarrhea). Get help right away if:  You have a fever and your symptoms suddenly get worse.  You start to feel mixed up (confused).  You have a very bad headache or a stiff neck.  You have very bad joint pains or stiffness.  You have jerky movements that you cannot control (seizure).  Your rash covers all or most of your body. The rash may or may not be painful.  You have blisters that: ? Are on top of the rash. ? Grow larger. ? Grow together. ? Are painful. ? Are inside your nose or mouth.  You have   a rash that: ? Looks like purple pinprick-sized spots all over your body. ? Has a "bull's eye" or looks like a target. ? Is red and painful, causes your skin to peel, and is not from being in the sun too long. Summary  A rash is a change in the color of your skin. A rash can also change the way your skin feels.  The goal of treatment is to stop the itching and keep the rash from spreading.  Take or apply over-the-counter and prescription medicines only as told by your doctor.  Contact a doctor if you have new symptoms or symptoms that get worse.  Keep all follow-up visits as told by your doctor. This is  important. This information is not intended to replace advice given to you by your health care provider. Make sure you discuss any questions you have with your health care provider. Document Revised: 11/17/2018 Document Reviewed: 02/27/2018 Elsevier Patient Education  2021 [employer]  

## 2020-08-21 LAB — BASIC METABOLIC PANEL
BUN/Creatinine Ratio: 21 (ref 9–23)
BUN: 18 mg/dL (ref 6–20)
CO2: 22 mmol/L (ref 20–29)
Calcium: 9.7 mg/dL (ref 8.7–10.2)
Chloride: 103 mmol/L (ref 96–106)
Creatinine, Ser: 0.84 mg/dL (ref 0.57–1.00)
GFR calc Af Amer: 107 mL/min/{1.73_m2} (ref >59)
GFR calc non Af Amer: 93 mL/min/{1.73_m2} (ref >59)
Glucose: 89 mg/dL (ref 65–99)
Potassium: 3.8 mmol/L (ref 3.5–5.2)
Sodium: 140 mmol/L (ref 134–144)

## 2020-08-21 LAB — HEPATITIS C ANTIBODY: Hep C Virus Ab: <0.1 s/co ratio (ref 0.0–0.9)

## 2020-08-21 LAB — CBC WITH DIFFERENTIAL/PLATELET
Basophils Absolute: 0 10*3/uL (ref 0.0–0.2)
Basos: 0 %
EOS (ABSOLUTE): 0.4 10*3/uL (ref 0.0–0.4)
Eos: 6 %
Hematocrit: 41.1 % (ref 34.0–46.6)
Hemoglobin: 13.8 g/dL (ref 11.1–15.9)
Immature Grans (Abs): 0 10*3/uL (ref 0.0–0.1)
Immature Granulocytes: 0 %
Lymphocytes Absolute: 1.1 10*3/uL (ref 0.7–3.1)
Lymphs: 20 %
MCH: 27.8 pg (ref 26.6–33.0)
MCHC: 33.6 g/dL (ref 31.5–35.7)
MCV: 83 fL (ref 79–97)
Monocytes Absolute: 0.5 10*3/uL (ref 0.1–0.9)
Monocytes: 8 %
Neutrophils Absolute: 3.6 10*3/uL (ref 1.4–7.0)
Neutrophils: 66 %
Platelets: 365 10*3/uL (ref 150–450)
RBC: 4.96 x10E6/uL (ref 3.77–5.28)
RDW: 12.9 % (ref 11.7–15.4)
WBC: 5.6 10*3/uL (ref 3.4–10.8)

## 2020-08-21 LAB — URIC ACID: Uric Acid: 4.4 mg/dL (ref 2.6–6.2)

## 2020-08-21 LAB — ANA W/REFLEX IF POSITIVE: Anti Nuclear Antibody (ANA): NEGATIVE

## 2020-08-21 LAB — SEDIMENTATION RATE: Sed Rate: 7 mm/hr (ref 0–32)

## 2020-08-21 LAB — C-REACTIVE PROTEIN: CRP: 10 mg/L (ref 0–10)

## 2020-08-21 NOTE — Progress Notes (Signed)
Contacted via MyChart   Good evening Katelyn Whitehead, I have great news!!  All of your labs returned normal.  Your Hep C testing was negative.  All inflammatory and arthritis labs returned negative.  Uric acid is normal range.  So overall good news.  Any questions? Keep being awesome!!  Thank you for allowing me to participate in your care. Kindest regards, Hansen Carino

## 2020-09-02 ENCOUNTER — Other Ambulatory Visit: Payer: Self-pay | Admitting: Nurse Practitioner

## 2020-09-02 DIAGNOSIS — R509 Fever, unspecified: Secondary | ICD-10-CM

## 2020-09-03 ENCOUNTER — Telehealth: Payer: Self-pay

## 2020-09-03 ENCOUNTER — Ambulatory Visit: Payer: No Typology Code available for payment source | Admitting: Family Medicine

## 2020-09-03 ENCOUNTER — Encounter: Payer: Self-pay | Admitting: Family Medicine

## 2020-09-03 ENCOUNTER — Telehealth (INDEPENDENT_AMBULATORY_CARE_PROVIDER_SITE_OTHER): Payer: No Typology Code available for payment source | Admitting: Family Medicine

## 2020-09-03 ENCOUNTER — Other Ambulatory Visit: Payer: Self-pay

## 2020-09-03 VITALS — HR 107 | Temp 97.4°F

## 2020-09-03 DIAGNOSIS — U071 COVID-19: Secondary | ICD-10-CM

## 2020-09-03 MED ORDER — ALBUTEROL SULFATE HFA 108 (90 BASE) MCG/ACT IN AERS
INHALATION_SPRAY | RESPIRATORY_TRACT | 1 refills | Status: DC
Start: 2020-09-03 — End: 2020-11-02

## 2020-09-03 NOTE — Telephone Encounter (Signed)
-----   Message from Marjie Skiff, NP sent at 09/02/2020  7:57 PM EST ----- Reach out to patient for virtual visit -- recent + Covid testing at work.

## 2020-09-03 NOTE — Telephone Encounter (Signed)
Called pt unable to lvm to make apt.

## 2020-09-03 NOTE — Patient Instructions (Signed)
It was great to see you!  Our plans for today:  - See below for self-isolation guidelines. You may end your quarantine once you are 10 days from symptom onset and fever free for 24 hours without use of tylenol or ibuprofen.  - We are referring you for COVID treatment. Someone will be contacting you about scheduling this if you qualify. We are experiencing a national shortage of the monoclonal antibodies and the oral antivirals as well as a backlog of patients needing treatment so this may cause a delay. - I recommend getting your COVID booster once you are healed from your current infection and 5 months out from your 2nd dose. If you get the antibody infusion, you must wait 3 months before vaccination.  - Certainly, if you are having difficulties breathing or unable to keep down fluids, go to the Emergency Department.   Take care and seek immediate care sooner if you develop any concerns.   Dr. Linwood Dibbles     Person Under Monitoring Name: Katelyn Whitehead  Location: [address] [place] [address]   Infection Prevention Recommendations for Individuals Confirmed to have, or Being Evaluated for, 2019 Novel Coronavirus (COVID-19) Infection Who Receive Care at Home  Individuals who are confirmed to have, or are being evaluated for, COVID-19 should follow the prevention steps below until a healthcare provider or local or state health department says they can return to normal activities.  Stay home except to get medical care You should restrict activities outside your home, except for getting medical care. Do not go to work, school, or public areas, and do not use public transportation or taxis.  Call ahead before visiting your doctor Before your medical appointment, call the healthcare provider and tell them that you have, or are being evaluated for, COVID-19 infection. This will help the healthcare provider's office take steps to keep other people from getting infected. Ask your  healthcare provider to call the local or state health department.  Monitor your symptoms Seek prompt medical attention if your illness is worsening (e.g., difficulty breathing). Before going to your medical appointment, call the healthcare provider and tell them that you have, or are being evaluated for, COVID-19 infection. Ask your healthcare provider to call the local or state health department.  Wear a facemask You should wear a facemask that covers your nose and mouth when you are in the same room with other people and when you visit a healthcare provider. People who live with or visit you should also wear a facemask while they are in the same room with you.  Separate yourself from other people in your home As much as possible, you should stay in a different room from other people in your home. Also, you should use a separate bathroom, if available.  Avoid sharing household items You should not share dishes, drinking glasses, cups, eating utensils, towels, bedding, or other items with other people in your home. After using these items, you should wash them thoroughly with soap and water.  Cover your coughs and sneezes Cover your mouth and nose with a tissue when you cough or sneeze, or you can cough or sneeze into your sleeve. Throw used tissues in a lined trash can, and immediately wash your hands with soap and water for at least 20 seconds or use an alcohol-based hand rub.  Wash your [employer] your hands often and thoroughly with soap and water for at least 20 seconds. You can use an alcohol-based hand sanitizer if  soap and water are not available and if your hands are not visibly dirty. Avoid touching your eyes, nose, and mouth with unwashed hands.   Prevention Steps for Caregivers and Household Members of Individuals Confirmed to have, or Being Evaluated for, COVID-19 Infection Being Cared for in the Home  If you live with, or provide care at home for, a person confirmed to  have, or being evaluated for, COVID-19 infection please follow these guidelines to prevent infection:  Follow healthcare provider's instructions Make sure that you understand and can help the patient follow any healthcare provider instructions for all care.  Provide for the patient's basic needs You should help the patient with basic needs in the home and provide support for getting groceries, prescriptions, and other personal needs.  Monitor the patient's symptoms If they are getting sicker, call his or her medical provider and tell them that the patient has, or is being evaluated for, COVID-19 infection. This will help the healthcare provider's office take steps to keep other people from getting infected. Ask the healthcare provider to call the local or state health department.  Limit the number of people who have contact with the patient  If possible, have only one caregiver for the patient.  Other household members should stay in another home or place of residence. If this is not possible, they should stay  in another room, or be separated from the patient as much as possible. Use a separate bathroom, if available.  Restrict visitors who do not have an essential need to be in the home.  Keep older adults, very young children, and other sick people away from the patient Keep older adults, very young children, and those who have compromised immune systems or chronic health conditions away from the patient. This includes people with chronic heart, lung, or kidney conditions, diabetes, and cancer.  Ensure good ventilation Make sure that shared spaces in the home have good air flow, such as from an air conditioner or an opened window, weather permitting.  Wash your hands often  Wash your hands often and thoroughly with soap and water for at least 20 seconds. You can use an alcohol based hand sanitizer if soap and water are not available and if your hands are not visibly  dirty.  Avoid touching your eyes, nose, and mouth with unwashed hands.  Use disposable paper towels to dry your hands. If not available, use dedicated cloth towels and replace them when they become wet.  Wear a facemask and gloves  Wear a disposable facemask at all times in the room and gloves when you touch or have contact with the patient's blood, body fluids, and/or secretions or excretions, such as sweat, saliva, sputum, nasal mucus, vomit, urine, or feces.  Ensure the mask fits over your nose and mouth tightly, and do not touch it during use.  Throw out disposable facemasks and gloves after using them. Do not reuse.  Wash your hands immediately after removing your facemask and gloves.  If your personal clothing becomes contaminated, carefully remove clothing and launder. Wash your hands after handling contaminated clothing.  Place all used disposable facemasks, gloves, and other waste in a lined container before disposing them with other household waste.  Remove gloves and wash your hands immediately after handling these items.  Do not share dishes, glasses, or other household items with the patient  Avoid sharing household items. You should not share dishes, drinking glasses, cups, eating utensils, towels, bedding, or other items with a patient  who is confirmed to have, or being evaluated for, COVID-19 infection.  After the person uses these items, you should wash them thoroughly with soap and water.  Wash laundry thoroughly  Immediately remove and wash clothes or bedding that have blood, body fluids, and/or secretions or excretions, such as sweat, saliva, sputum, nasal mucus, vomit, urine, or feces, on them.  Wear gloves when handling laundry from the patient.  Read and follow directions on labels of laundry or clothing items and detergent. In general, wash and dry with the warmest temperatures recommended on the label.  Clean all areas the individual has used often  Clean  all touchable surfaces, such as counters, tabletops, doorknobs, bathroom fixtures, toilets, phones, keyboards, tablets, and bedside tables, every day. Also, clean any surfaces that may have blood, body fluids, and/or secretions or excretions on them.  Wear gloves when cleaning surfaces the patient has come in contact with.  Use a diluted bleach solution (e.g., dilute bleach with 1 part bleach and 10 parts water) or a household disinfectant with a label that says EPA-registered for coronaviruses. To make a bleach solution at home, add 1 tablespoon of bleach to 1 quart (4 cups) of water. For a larger supply, add  cup of bleach to 1 gallon (16 cups) of water.  Read labels of cleaning products and follow recommendations provided on product labels. Labels contain instructions for safe and effective use of the cleaning product including precautions you should take when applying the product, such as wearing gloves or eye protection and making sure you have good ventilation during use of the product.  Remove gloves and wash hands immediately after cleaning.  Monitor yourself for signs and symptoms of illness Caregivers and household members are considered close contacts, should monitor their health, and will be asked to limit movement outside of the home to the extent possible. Follow the monitoring steps for close contacts listed on the symptom monitoring form.   ? If you have additional questions, contact your local health department or call the epidemiologist on call at [phone] (available 24/7). ? This guidance is subject to change. For the most up-to-date guidance from [facility], please refer to their website: [email or web address]

## 2020-09-03 NOTE — Progress Notes (Signed)
Virtual Visit via Video Note  I connected with Katelyn Whitehead on 09/03/20 at 11:20 AM EST by a video enabled telemedicine application and verified that I am speaking with the correct person using two identifiers.  Location: Patient: home Provider: [facility]   I discussed the limitations of evaluation and management by telemedicine and the availability of in person appointments. The patient expressed understanding and agreed to proceed.  History of Present Illness:  UPPER RESPIRATORY TRACT INFECTION - finished prednisone taper 1/24 for rash 1/12. - symptom onset 09/01/20 - COVID+ 09/02/20, h/o prior COVID in 12/2019.  Worst symptom: body aches, fatigue Fever: yes 101.64F yesterday. No fever today. Cough: yes Shortness of breath: no Wheezing: no Chest pain: no Chest tightness: no Chest congestion: yes Nasal congestion: yes Runny nose: yes Post nasal drip: yes Sneezing: yes Sinus pressure: yes Headache: yes Face pain: yes Toothache: yes Ear pain: yes L>R Ear pressure: yes L>R Vomiting: no Fatigue: yes Context: better Recurrent sinusitis: no Relief with OTC cold/cough medications: hasn't tried  Treatments attempted: none     Observations/Objective:  Patient had trouble connecting to video visit, entirety of visit conducted over the phone.  Speaks in full sentences, no respiratory distress.   Assessment and Plan:  COVID-19 With mild sx. Will refer for COVID tx given h/o asthma. Reviewed OTC symptom relief, self-quarantine, emergency precautions. Recommend obtaining booster once eligible.    I discussed the assessment and treatment plan with the patient. The patient was provided an opportunity to ask questions and all were answered. The patient agreed with the plan and demonstrated an understanding of the instructions.   The patient was advised to call back or seek an in-person evaluation if the symptoms worsen or if the condition fails to improve as anticipated.  I  provided 12 minutes of non-face-to-face time during this encounter.   Caro Laroche, DO

## 2020-09-04 ENCOUNTER — Telehealth: Payer: Self-pay | Admitting: Physician Assistant

## 2020-09-04 ENCOUNTER — Telehealth: Payer: Self-pay | Admitting: Oncology

## 2020-09-04 ENCOUNTER — Telehealth: Payer: Self-pay | Admitting: Nurse Practitioner

## 2020-09-04 ENCOUNTER — Encounter: Payer: Self-pay | Admitting: Nurse Practitioner

## 2020-09-04 ENCOUNTER — Encounter: Payer: Self-pay | Admitting: Oncology

## 2020-09-04 NOTE — Telephone Encounter (Signed)
Called to discuss with patient about COVID-19 symptoms and the use of one of the available treatments for those with mild to moderate Covid symptoms and at a high risk of hospitalization.  Pt appears to qualify for outpatient treatment due to co-morbid conditions and/or a member of an at-risk group in accordance with the FDA Emergency Use Authorization.    Symptom onset: 1/24 Vaccinated: yes Booster? Not eligible yet Immunocompromised? no Qualifiers: BMI>25.   Unable to reach pt - left VM. Would be a good oral candidate if we can get a hold of her by tomorrow.   Cline Crock

## 2020-09-04 NOTE — Telephone Encounter (Signed)
Called to discuss with patient about COVID-19 symptoms and the use of one of the available treatments for those with mild to moderate Covid symptoms and at a high risk of hospitalization.  Pt appears to qualify for outpatient treatment due to co-morbid conditions and/or a member of an at-risk group in accordance with the FDA Emergency Use Authorization.    Symptom onset: 09/01/20 Vaccinated: Yes Booster? No Immunocompromised? Wss on steroids for rash  Qualifiers:  Past Medical History:  Diagnosis Date  . ADHD (attention deficit hyperactivity disorder)   . Anxiety   . Asthma    childhood  . COVID-19 virus infection 08/2020  . Depression   . Mild mitral and aortic regurgitation    a. 04/2013 Echo: EF 50-55%, mild AI, mild MR, no MVP.  Marland Kitchen Mitral valve prolapse   . Palpitations    a. 2012 Holter: sinus tach, pvc's.  . Panic disorder   . Urticaria    Obesity and asthma- no booster   Unable to reach pt - Left VM and MCM   Mauro Kaufmann

## 2020-09-04 NOTE — Telephone Encounter (Signed)
Called patient to discuss Covid symptoms and the use of oral antiviral or monoclonal antibody infusion for those with mild to moderate Covid symptoms and at a high risk of hospitalization.  Pt is qualified for this infusion at the [facility] due to; Specific high risk criteria : BMI > 25   Message left to call back our hotline [phone].  Nicolasa Ducking, NP

## 2020-09-04 NOTE — Telephone Encounter (Signed)
Called to discuss with [name] about Covid symptoms and the use of sotrovimab, remdisivir or oral therapies for those with mild to moderate Covid symptoms and at a high risk of hospitalization.     Pt is qualified due to co-morbid conditions and/or a member of an at-risk group (BMI 30), however declines infusion at this time. Symptoms tier reviewed as well as criteria for ending isolation.  Symptoms reviewed that would warrant ED/Hospital evaluation. Preventative practices reviewed. Patient verbalized understanding. Patient advised to call back if he decides that he does want to get infusion. Callback number to the infusion center given. Patient advised to go to Urgent care or ED with severe symptoms. Last date pt would be eligible for infusion is 09/07/20.   Pt is feeling better and on the fence about potential treatment options. She will call us back if she changes her mind.     Patient Active Problem List   Diagnosis Date Noted  . Rash 08/20/2020  . History of COVID-19 12/03/2019  . Mixed obsessional thoughts and acts 09/01/2018  . Joint pain 09/21/2017  . Mild mitral and aortic regurgitation   . Panic disorder   . Depression, major, recurrent, moderate (HCC) 12/04/2014    Cline Crock PA-C

## 2020-09-04 NOTE — Telephone Encounter (Signed)
Please forward to appropriate provider.

## 2020-09-04 NOTE — Telephone Encounter (Signed)
fyi

## 2020-09-18 ENCOUNTER — Other Ambulatory Visit: Payer: Self-pay | Admitting: Psychiatry

## 2020-09-23 ENCOUNTER — Other Ambulatory Visit: Payer: Self-pay | Admitting: Nurse Practitioner

## 2020-09-23 DIAGNOSIS — Z30015 Encounter for initial prescription of vaginal ring hormonal contraceptive: Secondary | ICD-10-CM

## 2020-09-23 MED ORDER — ETONOGESTREL-ETHINYL ESTRADIOL 0.12-0.015 MG/24HR VA RING: VAGINAL_RING | VAGINAL | 12 refills | Status: DC

## 2020-10-21 ENCOUNTER — Telehealth: Payer: Self-pay | Admitting: *Deleted

## 2020-10-21 NOTE — Telephone Encounter (Signed)
Pt. VM full need Physical scheduled

## 2020-11-02 ENCOUNTER — Other Ambulatory Visit: Payer: Self-pay | Admitting: Family Medicine

## 2020-11-02 NOTE — Telephone Encounter (Signed)
Approved per protocol.  Requested Prescriptions  Pending Prescriptions Disp Refills  . albuterol (VENTOLIN HFA) 108 (90 Base) MCG/ACT inhaler [Pharmacy Med Name: ALBUTEROL HFA (VENTOLIN) INH] 18 each 1    Sig: TAKE 2 PUFFS BY MOUTH EVERY 6 HOURS AS NEEDED FOR WHEEZE OR SHORTNESS OF BREATH     Pulmonology:  Beta Agonists Failed - 11/02/2020 12:51 AM      Failed - One inhaler should last at least one month. If the patient is requesting refills earlier, contact the patient to check for uncontrolled symptoms.      Passed - Valid encounter within last 12 months    Recent Outpatient Visits          2 months ago COVID-19   [facility] Caro Laroche, DO   2 months ago Rash   [facility] [place], Corrie Dandy T, NP   7 months ago Palpitations   [facility] Cathlean Marseilles A, NP   10 months ago Irritant contact dermatitis due to plants, except food   [facility] Valentino Nose, NP   10 months ago Irritant contact dermatitis due to plants, except food   [facility] Valentino Nose, NP

## 2020-11-11 ENCOUNTER — Other Ambulatory Visit: Payer: Self-pay

## 2020-11-13 ENCOUNTER — Other Ambulatory Visit: Payer: Self-pay

## 2020-11-13 MED ORDER — VYVANSE 30 MG PO CAPS
30.0000 mg | ORAL_CAPSULE | Freq: Every morning | ORAL | 0 refills | Status: DC
Start: 2020-11-13 — End: 2021-02-25
  Filled 2020-11-13: qty 30, 30d supply, fill #0

## 2020-11-13 MED FILL — Lorazepam Tab 0.5 MG: ORAL | Qty: 45 | Fill #0 | Status: CN

## 2020-11-14 ENCOUNTER — Other Ambulatory Visit: Payer: Self-pay

## 2020-11-14 MED FILL — Lorazepam Tab 0.5 MG: ORAL | 30 days supply | Qty: 45 | Fill #0 | Status: AC

## 2020-11-21 ENCOUNTER — Ambulatory Visit
Admission: EM | Admit: 2020-11-21 | Discharge: 2020-11-21 | Disposition: A | Discharge status: Home / Self Care | Payer: No Typology Code available for payment source | Attending: Emergency Medicine | Admitting: Emergency Medicine

## 2020-11-21 ENCOUNTER — Encounter: Payer: Self-pay | Admitting: Emergency Medicine

## 2020-11-21 ENCOUNTER — Other Ambulatory Visit: Payer: Self-pay

## 2020-11-21 DIAGNOSIS — R319 Hematuria, unspecified: Secondary | ICD-10-CM | POA: Diagnosis not present

## 2020-11-21 DIAGNOSIS — M545 Low back pain, unspecified: Secondary | ICD-10-CM | POA: Insufficient documentation

## 2020-11-21 DIAGNOSIS — R1030 Lower abdominal pain, unspecified: Secondary | ICD-10-CM | POA: Insufficient documentation

## 2020-11-21 DIAGNOSIS — N390 Urinary tract infection, site not specified: Secondary | ICD-10-CM | POA: Diagnosis not present

## 2020-11-21 LAB — POCT URINALYSIS DIP (MANUAL ENTRY)
Bilirubin, UA: NEGATIVE
Glucose, UA: NEGATIVE mg/dL
Ketones, POC UA: NEGATIVE mg/dL
Nitrite, UA: POSITIVE — AB
Protein Ur, POC: 30 mg/dL — AB
Spec Grav, UA: >=1.03 — AB (ref 1.010–1.025)
Urobilinogen, UA: 0.2 E.U./dL
pH, UA: 6 (ref 5.0–8.0)

## 2020-11-21 LAB — POCT URINE PREGNANCY: Preg Test, Ur: NEGATIVE

## 2020-11-21 MED ORDER — CEPHALEXIN 500 MG PO CAPS: 500.0000 mg | ORAL_CAPSULE | Freq: Two times a day (BID) | ORAL | 0 refills | Status: AC

## 2020-11-21 NOTE — ED Provider Notes (Signed)
Katelyn Whitehead    CSN: [id number] Arrival date & time: 11/21/20  1528      History   Chief Complaint Chief Complaint  Patient presents with  . Hematuria    HPI Katelyn Whitehead is a 30 y.o. female.   Patient presents with 1 day history of hematuria, suprapubic pressure, bilateral low back pain.  She denies dysuria, fever, chills, vomiting, diarrhea, constipation, vaginal discharge, pelvic pain, or other symptoms.  No treatments attempted at home.  Her medical history includes mitral and aortic regurgitation, mitral valve prolapse, asthma, depression, anxiety, panic disorder, ADHD, mixed obsessional thoughts and acts.  The history is provided by the patient and medical records.    Past Medical History:  Diagnosis Date  . ADHD (attention deficit hyperactivity disorder)   . Anxiety   . Asthma    childhood  . COVID-19 virus infection 08/2020  . Depression   . Mild mitral and aortic regurgitation    a. 04/2013 Echo: EF 50-55%, mild AI, mild MR, no MVP.  Marland Kitchen Mitral valve prolapse   . Palpitations    a. 2012 Holter: sinus tach, pvc's.  . Panic disorder   . Urticaria     Patient Active Problem List   Diagnosis Date Noted  . Rash 08/20/2020  . History of COVID-19 12/03/2019  . Mixed obsessional thoughts and acts 09/01/2018  . Joint pain 09/21/2017  . Mild mitral and aortic regurgitation   . Panic disorder   . Depression, major, recurrent, moderate (HCC) 12/04/2014    Past Surgical History:  Procedure Laterality Date  . IUD REMOVAL N/A 11/22/2016   Procedure: LAPAROCOPIC INTRAUTERINE DEVICE (IUD) REMOVAL;  Surgeon: Linzie Collin, MD;  Location: [facility] ORS;  Service: Gynecology;  Laterality: N/A;  . TYMPANOSTOMY TUBE PLACEMENT    . WISDOM TOOTH EXTRACTION      OB History    Gravida  2   Para  1   Term  1   Preterm      AB  1   Living  1     SAB  1   IAB      Ectopic      Multiple  0   Live Births  1            Home Medications     Prior to Admission medications   Medication Sig Start Date End Date Taking? Authorizing Provider  cephALEXin (KEFLEX) 500 MG capsule Take 1 capsule (500 mg total) by mouth 2 (two) times daily for 5 days. 11/21/20 11/26/20 Yes Mickie Bail, NP  acetaminophen (TYLENOL) 500 MG tablet Take 500 mg by mouth every 6 (six) hours as needed.    [provider]  albuterol (VENTOLIN HFA) 108 (90 Base) MCG/ACT inhaler TAKE 2 PUFFS BY MOUTH EVERY 6 HOURS AS NEEDED FOR WHEEZE OR SHORTNESS OF BREATH 11/02/20   McElwee, Lauren A, NP  diphenhydrAMINE HCl (BENADRYL ALLERGY PO) Take by mouth as needed.     [provider]  etonogestrel-ethinyl estradiol (NUVARING) 0.12-0.015 MG/24HR vaginal ring INSERT VAGINALLY AND LEAVE IN PLACE FOR 3 CONSECUTIVE WEEKS, THEN REMOVE FOR 1 WEEK. Patient taking differently: INSERT VAGINALLY AND LEAVE IN PLACE FOR 3 CONSECUTIVE WEEKS, THEN REMOVE FOR 1 WEEK. 09/23/20 09/23/21  Cannady, Corrie Dandy T, NP  lisdexamfetamine (VYVANSE) 30 MG capsule Take 1 capsule (30 mg total) by mouth every morning. 11/13/20     LORazepam (ATIVAN) 0.5 MG tablet Take 0.25 mg by mouth 2 (two) times daily.  03/02/18  [provider]  LORazepam (ATIVAN) 0.5 MG tablet TAKE 1/2 TABLET BY MOUTH DAILY EVERY MORNING AND 1/2 TO 1 TABLET EVERY EVENING AS NEEDED FOR HIGH ANXIETY 09/18/20 03/17/21    LORazepam (ATIVAN) 0.5 MG tablet TAKE 1/2 TABLET BY MOUTH DAILY IN THE MORNING AND 1/2 TO 1 TABLET BY MOUTH IN THE EVENING AS NEEDED FOR HIGH ANXIETY 07/02/20 12/29/20    predniSONE (DELTASONE) 10 MG tablet TAKE TABLETS BY MOUTH AS FOLLOWS: 6 TABLETS X 2 DAYS, 5 TABLETS X 2 DAYS, 4 TABLETS X 2 DAYS, 2 TABLETS X 2 DAYS, 1 TABLET X 2 DAYS 08/20/20 08/20/21  Verlee MonteGray, Bryan E, NP  propranolol (INDERAL) 20 MG tablet TAKE 1 TABLET BY MOUTH 2 TIMES DAILY AS NEEDED (PALPITATIONS). Patient not taking: No sig reported 06/20/20 06/20/21  Alver SorrowWalker, Caitlin S, NP  sertraline (ZOLOFT) 100 MG tablet TAKE 1 & 1/2 TABLETS (150 MG) BY  MOUTH DAILY. 09/18/20 09/18/21  Darci NeedleMyers, Susan Marie, NP  sertraline (ZOLOFT) 100 MG tablet TAKE 1 & 1/2 TABLETS BY MOUTH DAILY 07/02/20 07/02/21    sertraline (ZOLOFT) 50 MG tablet Take 75 mg by mouth daily.  11/20/19   [provider]  triamcinolone (KENALOG) 0.1 % APPLY TO THE AFFECTED AREA(S) 2 TIMES DAILY 08/20/20 08/20/21  Marjie Skiffannady, Jolene T, NP    Family History Family History  Problem Relation Age of Onset  . Depression Mother   . Diabetes Mother        type 2  . Hyperlipidemia Mother   . Hypertension Mother   . Melanoma Mother        skin  . Asthma Mother   . Allergic rhinitis Mother   . Drug abuse Father   . Gout Father   . Depression Father   . Psychiatric Illness Father        PTSD  . Allergic rhinitis Father   . Urticaria Father   . Allergic rhinitis Sister   . ADD / ADHD Brother   . Depression Maternal Aunt   . Dementia Maternal Grandmother   . Depression Maternal Grandmother   . Heart disease Maternal Grandfather   . Diabetes Paternal Grandmother        type 2  . Hypertension Paternal Grandmother     Social History Social History   Tobacco Use  . Smoking status: Never Smoker  . Smokeless tobacco: Never Used  Vaping Use  . Vaping Use: Never used  Substance Use Topics  . Alcohol use: No    Alcohol/week: 0.0 standard drinks  . Drug use: No     Allergies   Influenza vaccines and Sulfa antibiotics   Review of Systems Review of Systems  Constitutional: Negative for chills and fever.  HENT: Negative for ear pain and sore throat.   Eyes: Negative for pain and visual disturbance.  Respiratory: Negative for cough and shortness of breath.   Cardiovascular: Negative for chest pain and palpitations.  Gastrointestinal: Positive for abdominal pain. Negative for vomiting.  Genitourinary: Positive for hematuria. Negative for dysuria, pelvic pain and vaginal discharge.  Musculoskeletal: Positive for back pain. Negative for arthralgias.  Skin: Negative  for color change and rash.  Neurological: Negative for seizures and syncope.  All other systems reviewed and are negative.    Physical Exam Triage Vital Signs ED Triage Vitals  Enc Vitals Group     BP      Pulse      Resp      Temp      Temp src  SpO2      Weight      Height      Head Circumference      Peak Flow      Pain Score      Pain Loc      Pain Edu?      Excl. in GC?    No data found.  Updated Vital Signs BP 104/73 (BP Location: Left Arm)   Pulse 91   Temp 98.8 F (37.1 C) (Oral)   Resp 16   LMP 11/01/2020   SpO2 98%   Visual Acuity Right Eye Distance:   Left Eye Distance:   Bilateral Distance:    Right Eye Near:   Left Eye Near:    Bilateral Near:     Physical Exam Vitals and nursing note reviewed.  Constitutional:      General: She is not in acute distress.    Appearance: She is well-developed. She is not ill-appearing.  HENT:     Head: Normocephalic and atraumatic.     Mouth/Throat:     Mouth: Mucous membranes are moist.  Eyes:     Conjunctiva/sclera: Conjunctivae normal.  Cardiovascular:     Rate and Rhythm: Normal rate and regular rhythm.     Heart sounds: Normal heart sounds.  Pulmonary:     Effort: Pulmonary effort is normal. No respiratory distress.     Breath sounds: Normal breath sounds.  Abdominal:     General: Bowel sounds are normal.     Palpations: Abdomen is soft.     Tenderness: There is no abdominal tenderness. There is no right CVA tenderness, left CVA tenderness, guarding or rebound.  Musculoskeletal:     Cervical back: Neck supple.  Skin:    General: Skin is warm and dry.     Findings: No rash.  Neurological:     General: No focal deficit present.     Mental Status: She is alert and oriented to person, place, and time.     Gait: Gait normal.  Psychiatric:        Mood and Affect: Mood normal.        Behavior: Behavior normal.      UC Treatments / Results  Labs (all labs ordered are listed, but only  abnormal results are displayed) Labs Reviewed  POCT URINALYSIS DIP (MANUAL ENTRY) - Abnormal; Notable for the following components:      Result Value   Color, UA brown (*)    Clarity, UA cloudy (*)    Spec Grav, UA >=1.030 (*)    Blood, UA large (*)    Protein Ur, POC =30 (*)    Nitrite, UA Positive (*)    Leukocytes, UA Small (1+) (*)    All other components within normal limits  URINE CULTURE  POCT URINE PREGNANCY    EKG   Radiology No results found.  Procedures Procedures (including critical care time)  Medications Ordered in UC Medications - No data to display  Initial Impression / Assessment and Plan / UC Course  I have reviewed the triage vital signs and the nursing notes.  Pertinent labs & imaging results that were available during my care of the patient were reviewed by me and considered in my medical decision making (see chart for details).   UTI with hematuria, lower abdominal pain, low back pain.  Patient is afebrile, well-appearing, her exam is reassuring.  Urine culture pending.  Urine pregnancy negative.  Treating with Keflex.  Discussed with patient that we  will call her if the culture shows need to change or discontinue the antibiotic.  ED precautions discussed for acute abdominal pain or other concerning symptoms.  Instructed patient to follow-up with her PCP as needed.  She agrees to plan of care.   Final Clinical Impressions(s) / UC Diagnoses   Final diagnoses:  Urinary tract infection with hematuria, site unspecified  Lower abdominal pain  Acute bilateral low back pain without sciatica     Discharge Instructions     Take the antibiotic as directed.  The urine culture is pending.  We will call you if it shows the need to change or discontinue your antibiotic.  Follow up with your primary care provider if your symptoms are not improving.    Go to the Emergency Department if you have acute abdominal pain or other concerning symptoms.           ED Prescriptions    Medication Sig Dispense Auth. Provider   cephALEXin (KEFLEX) 500 MG capsule Take 1 capsule (500 mg total) by mouth 2 (two) times daily for 5 days. 10 capsule Mickie Bail, NP     PDMP not reviewed this encounter.   Mickie Bail, NP 11/21/20 1558

## 2020-11-21 NOTE — Discharge Instructions (Signed)
Take the antibiotic as directed.  The urine culture is pending.  We will call you if it shows the need to change or discontinue your antibiotic.  Follow up with your primary care provider if your symptoms are not improving.    Go to the Emergency Department if you have acute abdominal pain or other concerning symptoms.

## 2020-11-21 NOTE — ED Triage Notes (Signed)
Patient c/o hematuria x 1 day.   Patient endorses ABD discomfort and  bilateral back pain.   Patient denies dysuria and foul odor.   Patient denies abnormal vaginal discharge.   Patient hasn't taken any medications for symptoms.

## 2020-11-24 LAB — URINE CULTURE: Culture: >=100000 — AB

## 2020-11-27 ENCOUNTER — Encounter: Payer: Self-pay | Admitting: Nurse Practitioner

## 2020-11-27 ENCOUNTER — Other Ambulatory Visit: Payer: Self-pay

## 2020-11-27 ENCOUNTER — Ambulatory Visit (INDEPENDENT_AMBULATORY_CARE_PROVIDER_SITE_OTHER): Payer: No Typology Code available for payment source | Admitting: Nurse Practitioner

## 2020-11-27 VITALS — BP 107/70 | HR 105 | Temp 98.9°F | Wt 159.6 lb

## 2020-11-27 DIAGNOSIS — Z3009 Encounter for other general counseling and advice on contraception: Secondary | ICD-10-CM

## 2020-11-27 DIAGNOSIS — R2231 Localized swelling, mass and lump, right upper limb: Secondary | ICD-10-CM | POA: Diagnosis not present

## 2020-11-27 DIAGNOSIS — N390 Urinary tract infection, site not specified: Secondary | ICD-10-CM

## 2020-11-27 LAB — MICROSCOPIC EXAMINATION
Bacteria, UA: NONE SEEN
WBC, UA: NONE SEEN /hpf (ref 0–5)

## 2020-11-27 LAB — URINALYSIS, ROUTINE W REFLEX MICROSCOPIC
Bilirubin, UA: NEGATIVE
Glucose, UA: NEGATIVE
Ketones, UA: NEGATIVE
Leukocytes,UA: NEGATIVE
Nitrite, UA: NEGATIVE
Protein,UA: NEGATIVE
Specific Gravity, UA: 1.025 (ref 1.005–1.030)
Urobilinogen, Ur: 0.2 mg/dL (ref 0.2–1.0)
pH, UA: 7 (ref 5.0–7.5)

## 2020-11-27 MED ORDER — NORETHINDRONE 0.35 MG PO TABS
1.0000 | ORAL_TABLET | Freq: Every day | ORAL | 11 refills | Status: DC
  Filled 2020-11-27: qty 28, 28d supply, fill #0

## 2020-11-27 NOTE — Assessment & Plan Note (Signed)
Discussed progesterone only options including minipill, IUD, implant, and injection. She opted for minipill. Discussed that timing and not missing a progesterone only pill for contraception is extremely important. Discussed using a back-up method for at least 2 weeks while changing from the nuvaring since she removed her last one 1 week ago. Prescription sent to pharmacy. Follow-up with any questions or concerns.

## 2020-11-27 NOTE — Patient Instructions (Signed)
Norethindrone tablets (contraception) What is this medicine? NORETHINDRONE (nor eth IN drone) is an oral contraceptive. The product contains a female hormone known as a progestin. It is used to prevent pregnancy. This medicine may be used for other purposes; ask your health care provider or pharmacist if you have questions. COMMON BRAND NAME(S): Camila, Deblitane 28-Day, Errin, Heather, Jencycla, Jolivette, Lyza, Nor-QD, Nora-BE, Norlyroc, Ortho Micronor, Sharobel 28-Day What should I tell my health care provider before I take this medicine? They need to know if you have any of these conditions:  blood vessel disease or blood clots  breast, cervical, or vaginal cancer  diabetes  heart disease  kidney disease  liver disease  mental depression  migraine  seizures  stroke  vaginal bleeding  an unusual or allergic reaction to norethindrone, other medicines, foods, dyes, or preservatives  pregnant or trying to get pregnant  breast-feeding How should I use this medicine? Take this medicine by mouth with a glass of water. You may take it with or without food. Follow the directions on the prescription label. Take this medicine at the same time each day and in the order directed on the package. Do not take your medicine more often than directed. Contact your pediatrician regarding the use of this medicine in children. Special care may be needed. This medicine has been used in female children who have started having menstrual periods. A patient package insert for the product will be given with each prescription and refill. Read this sheet carefully each time. The sheet may change frequently. Overdosage: If you think you have taken too much of this medicine contact a poison control center or emergency room at once. NOTE: This medicine is only for you. Do not share this medicine with others. What if I miss a dose? Try not to miss a dose. Every time you miss a dose or take a dose late  your chance of pregnancy increases. When 1 pill is missed (even if only 3 hours late), take the missed pill as soon as possible and continue taking a pill each day at the regular time (use a back up method of birth control for the next 48 hours). If more than 1 dose is missed, use an additional birth control method for the rest of your pill pack until menses occurs. Contact your health care professional if more than 1 dose has been missed. What may interact with this medicine? Do not take this medicine with any of the following medications:  amprenavir or fosamprenavir  bosentan This medicine may also interact with the following medications:  antibiotics or medicines for infections, especially rifampin, rifabutin, rifapentine, and griseofulvin, and possibly penicillins or tetracyclines  aprepitant  barbiturate medicines, such as phenobarbital  carbamazepine  felbamate  modafinil  oxcarbazepine  phenytoin  ritonavir or other medicines for HIV infection or AIDS  St. John's wort  topiramate This list may not describe all possible interactions. Give your health care provider a list of all the medicines, herbs, non-prescription drugs, or dietary supplements you use. Also tell them if you smoke, drink alcohol, or use illegal drugs. Some items may interact with your medicine. What should I watch for while using this medicine? Visit your doctor or health care professional for regular checks on your progress. You will need a regular breast and pelvic exam and Pap smear while on this medicine. Use an additional method of birth control during the first cycle that you take these tablets. If you have any reason to think you   are pregnant, stop taking this medicine right away and contact your doctor or health care professional. If you are taking this medicine for hormone related problems, it may take several cycles of use to see improvement in your condition. This medicine does not protect you  against HIV infection (AIDS) or any other sexually transmitted diseases. What side effects may I notice from receiving this medicine? Side effects that you should report to your doctor or health care professional as soon as possible:  breast tenderness or discharge  pain in the abdomen, chest, groin or leg  severe headache  skin rash, itching, or hives  sudden shortness of breath  unusually weak or tired  vision or speech problems  yellowing of skin or eyes Side effects that usually do not require medical attention (report to your doctor or health care professional if they continue or are bothersome):  changes in sexual desire  change in menstrual flow  facial hair growth  fluid retention and swelling  headache  irritability  nausea  weight gain or loss This list may not describe all possible side effects. Call your doctor for medical advice about side effects. You may report side effects to FDA at 1-800-FDA-1088. Where should I keep my medicine? Keep out of the reach of children. Store at room temperature between 15 and 30 degrees C (59 and 86 degrees F). Throw away any unused medicine after the expiration date. NOTE: This sheet is a summary. It may not cover all possible information. If you have questions about this medicine, talk to your doctor, pharmacist, or health care provider.  2021 Elsevier/Gold Standard (2012-04-14 16:41:35)  

## 2020-11-27 NOTE — Progress Notes (Addendum)
Acute Office Visit  Subjective:    Patient ID: Katelyn Whitehead, female    DOB: 1991/02/09, 30 y.o.   MRN: [id number]  Chief Complaint  Patient presents with  . Contraception    Patient is here to discuss birth options and also patient would like to follow up from a recent diagnosed UTI at urgent care.    HPI Patient is in today to discuss birth control options, frequent UTIs, and a lump she noticed on her wrist.   CONTRACEPTION  She recently heard of a young woman who died potentially died from a pulmonary embolism related to her birth control. She wants to discuss options that don't increase her risk of blood clots. She does not have a history of blood clots and doesn't smoke. She is still interested in the possibility of having another baby in the future. She is currently using the nuvaring and has tried an IUD in the past.   UTI  She was recently diagnosed with a UTI and treated. She has a history of frequent UTIs, approximately 2-3 per year. Her daughter was diagnosed with urinary reflux and is wondering if she could have this as well. She states she maintains good urinary hygiene habits.   SKIN LUMP  Has noticed a lump on her right posterior wrist. It started about 4 months ago. It is not painful unless touched. She has not tried anything to help. No alleviating or aggravating factors. Denies redness or warmth.   Past Medical History:  Diagnosis Date  . ADHD (attention deficit hyperactivity disorder)   . Anxiety   . Asthma    childhood  . COVID-19 virus infection 08/2020  . Depression   . Mild mitral and aortic regurgitation    a. 04/2013 Echo: EF 50-55%, mild AI, mild MR, no MVP.  Marland Kitchen Mitral valve prolapse   . Palpitations    a. 2012 Holter: sinus tach, pvc's.  . Panic disorder   . Urticaria     Past Surgical History:  Procedure Laterality Date  . IUD REMOVAL N/A 11/22/2016   Procedure: LAPAROCOPIC INTRAUTERINE DEVICE (IUD) REMOVAL;  Surgeon: Linzie Collin, MD;   Location: [facility] ORS;  Service: Gynecology;  Laterality: N/A;  . TYMPANOSTOMY TUBE PLACEMENT    . WISDOM TOOTH EXTRACTION      Family History  Problem Relation Age of Onset  . Depression Mother   . Diabetes Mother        type 2  . Hyperlipidemia Mother   . Hypertension Mother   . Melanoma Mother        skin  . Asthma Mother   . Allergic rhinitis Mother   . Drug abuse Father   . Gout Father   . Depression Father   . Psychiatric Illness Father        PTSD  . Allergic rhinitis Father   . Urticaria Father   . Allergic rhinitis Sister   . ADD / ADHD Brother   . Depression Maternal Aunt   . Dementia Maternal Grandmother   . Depression Maternal Grandmother   . Heart disease Maternal Grandfather   . Diabetes Paternal Grandmother        type 2  . Hypertension Paternal Grandmother   . Crohn's disease Daughter   . Stroke Paternal Grandfather     Social History   Socioeconomic History  . Marital status: Married    Spouse name: Not on file  . Number of children: Not on file  . Years of education:  Not on file  . Highest education level: Not on file  Occupational History  . Not on file  Tobacco Use  . Smoking status: Never Smoker  . Smokeless tobacco: Never Used  Vaping Use  . Vaping Use: Never used  Substance and Sexual Activity  . Alcohol use: No    Alcohol/week: 0.0 standard drinks  . Drug use: No  . Sexual activity: Yes    Birth control/protection: Condom  Other Topics Concern  . Not on file  Social History Narrative  . Not on file   Social Determinants of Health   Financial Resource Strain: Not on file  Food Insecurity: Not on file  Transportation Needs: Not on file  Physical Activity: Not on file  Stress: Not on file  Social Connections: Not on file  Intimate Partner Violence: Not on file    Outpatient Medications Prior to Visit  Medication Sig Dispense Refill  . acetaminophen (TYLENOL) 500 MG tablet Take 500 mg by mouth every 6 (six) hours as needed.     Marland Kitchen. albuterol (VENTOLIN HFA) 108 (90 Base) MCG/ACT inhaler TAKE 2 PUFFS BY MOUTH EVERY 6 HOURS AS NEEDED FOR WHEEZE OR SHORTNESS OF BREATH 18 each 1  . diphenhydrAMINE HCl (BENADRYL ALLERGY PO) Take by mouth as needed.     Marland Kitchen. lisdexamfetamine (VYVANSE) 30 MG capsule Take 1 capsule (30 mg total) by mouth every morning. 30 capsule 0  . LORazepam (ATIVAN) 0.5 MG tablet Take 0.25 mg by mouth 2 (two) times daily.     . propranolol (INDERAL) 20 MG tablet TAKE 1 TABLET BY MOUTH 2 TIMES DAILY AS NEEDED (PALPITATIONS). 60 tablet 2  . sertraline (ZOLOFT) 100 MG tablet TAKE 1 & 1/2 TABLETS (150 MG) BY MOUTH DAILY. 135 tablet 1  . sertraline (ZOLOFT) 50 MG tablet Take 75 mg by mouth daily.     Marland Kitchen. LORazepam (ATIVAN) 0.5 MG tablet TAKE 1/2 TABLET BY MOUTH DAILY EVERY MORNING AND 1/2 TO 1 TABLET EVERY EVENING AS NEEDED FOR HIGH ANXIETY (Patient not taking: Reported on 11/27/2020) 45 tablet 2  . LORazepam (ATIVAN) 0.5 MG tablet TAKE 1/2 TABLET BY MOUTH DAILY IN THE MORNING AND 1/2 TO 1 TABLET BY MOUTH IN THE EVENING AS NEEDED FOR HIGH ANXIETY (Patient not taking: Reported on 11/27/2020) 45 tablet 2  . predniSONE (DELTASONE) 10 MG tablet TAKE TABLETS BY MOUTH AS FOLLOWS: 6 TABLETS X 2 DAYS, 5 TABLETS X 2 DAYS, 4 TABLETS X 2 DAYS, 2 TABLETS X 2 DAYS, 1 TABLET X 2 DAYS (Patient not taking: Reported on 11/27/2020) 36 tablet 0  . sertraline (ZOLOFT) 100 MG tablet TAKE 1 & 1/2 TABLETS BY MOUTH DAILY (Patient not taking: Reported on 11/27/2020) 60 tablet 1  . triamcinolone (KENALOG) 0.1 % APPLY TO THE AFFECTED AREA(S) 2 TIMES DAILY (Patient not taking: Reported on 11/27/2020) 30 g 2  . etonogestrel-ethinyl estradiol (NUVARING) 0.12-0.015 MG/24HR vaginal ring INSERT VAGINALLY AND LEAVE IN PLACE FOR 3 CONSECUTIVE WEEKS, THEN REMOVE FOR 1 WEEK. (Patient not taking: Reported on 11/27/2020) 1 each 12   No facility-administered medications prior to visit.    Allergies  Allergen Reactions  . Influenza Vaccines Hives  . Sulfa  Antibiotics     Hives Rash    Review of Systems  Constitutional: Positive for fatigue. Negative for fever.  HENT: Negative.   Eyes: Negative.   Respiratory: Negative.   Cardiovascular: Negative.   Gastrointestinal: Negative.   Genitourinary:       Frequent UTIs  Musculoskeletal: Negative.  Skin:       Lump on right posterior wrist  Neurological: Negative.        Objective:    Physical Exam Vitals and nursing note reviewed.  Constitutional:      General: She is not in acute distress.    Appearance: Normal appearance.  HENT:     Head: Normocephalic.  Eyes:     Conjunctiva/sclera: Conjunctivae normal.  Cardiovascular:     Rate and Rhythm: Normal rate and regular rhythm.     Pulses: Normal pulses.     Heart sounds: Normal heart sounds.  Pulmonary:     Effort: Pulmonary effort is normal.     Breath sounds: Normal breath sounds.  Musculoskeletal:     Cervical back: Normal range of motion.  Skin:    General: Skin is warm.     Comments: 1cm round cyst like lesion to right posterior wrist. No redness or warmth  Neurological:     General: No focal deficit present.     Mental Status: She is alert and oriented to person, place, and time.  Psychiatric:        Mood and Affect: Mood normal.        Behavior: Behavior normal.        Thought Content: Thought content normal.        Judgment: Judgment normal.     BP 107/70   Pulse (!) 105   Temp 98.9 F (37.2 C) (Oral)   Wt 159 lb 9.6 oz (72.4 kg)   LMP 11/01/2020   SpO2 98%   BMI 29.07 kg/m  Wt Readings from Last 3 Encounters:  11/27/20 159 lb 9.6 oz (72.4 kg)  08/20/20 164 lb 12.8 oz (74.8 kg)  06/20/20 163 lb (73.9 kg)    There are no preventive care reminders to display for this patient.  There are no preventive care reminders to display for this patient.   Lab Results  Component Value Date   TSH 1.543 09/13/2019   Lab Results  Component Value Date   WBC 5.6 08/20/2020   HGB 13.8 08/20/2020   HCT  41.1 08/20/2020   MCV 83 08/20/2020   PLT 365 08/20/2020   Lab Results  Component Value Date   NA 140 08/20/2020   K 3.8 08/20/2020   CO2 22 08/20/2020   GLUCOSE 89 08/20/2020   BUN 18 08/20/2020   CREATININE 0.84 08/20/2020   BILITOT 0.4 09/13/2019   ALKPHOS 77 09/13/2019   AST 20 09/13/2019   ALT 21 09/13/2019   PROT 7.5 09/13/2019   ALBUMIN 3.8 09/13/2019   CALCIUM 9.7 08/20/2020   ANIONGAP 8 06/19/2020   Lab Results  Component Value Date   CHOL 172 09/10/2019   Lab Results  Component Value Date   HDL 41 09/10/2019   Lab Results  Component Value Date   LDLCALC 116 (H) 09/10/2019   Lab Results  Component Value Date   TRIG 79 09/10/2019   No results found for: CHOLHDL Lab Results  Component Value Date   HGBA1C 5.5 09/13/2019       Assessment & Plan:   Problem List Items Addressed This Visit      Genitourinary   Frequent UTI - Primary   Relevant Orders   Urinalysis, Routine w reflex microscopic (Completed)   Ambulatory referral to Urology   Microscopic Examination (Completed)     Other   Birth control counseling    Discussed progesterone only options including minipill, IUD, implant, and injection. She  opted for minipill. Discussed that timing and not missing a progesterone only pill for contraception is extremely important. Discussed using a back-up method for at least 2 weeks while changing from the nuvaring since she removed her last one 1 week ago. Prescription sent to pharmacy. Follow-up with any questions or concerns.        Other Visit Diagnoses    Lump of skin of right upper extremity       Will check ultrasound of right posterior wrist    Relevant Orders   [place] RT UPPER EXTREM LTD SOFT TISSUE NON VASCULAR       Meds ordered this encounter  Medications  . DISCONTD: norethindrone (CAMILA) 0.35 MG tablet    Sig: Take 1 tablet (0.35 mg total) by mouth daily.    Dispense:  28 tablet    Refill:  11     Gerre Scull, NP

## 2020-12-01 ENCOUNTER — Other Ambulatory Visit: Payer: Self-pay

## 2020-12-01 ENCOUNTER — Other Ambulatory Visit: Payer: Self-pay | Admitting: Nurse Practitioner

## 2020-12-01 MED ORDER — NORETHINDRONE 0.35 MG PO TABS
1.0000 | ORAL_TABLET | Freq: Every day | ORAL | 11 refills | Status: DC
  Filled 2020-12-01: qty 84, 84d supply, fill #0

## 2020-12-01 MED ORDER — SLYND 4 MG PO TABS
4.0000 mg | ORAL_TABLET | Freq: Every day | ORAL | 11 refills | Status: DC
  Filled 2020-12-01: qty 28, 28d supply, fill #0
  Filled 2020-12-29: qty 28, 28d supply, fill #1
  Filled 2021-01-26: qty 28, 28d supply, fill #2
  Filled 2021-02-20: qty 84, 84d supply, fill #3
  Filled 2021-05-15: qty 84, 84d supply, fill #4
  Filled 2021-08-06: qty 84, 84d supply, fill #5

## 2020-12-02 ENCOUNTER — Other Ambulatory Visit: Payer: Self-pay

## 2020-12-02 MED FILL — Sertraline HCl Tab 100 MG: ORAL | 90 days supply | Qty: 135 | Fill #0 | Status: AC

## 2020-12-15 ENCOUNTER — Other Ambulatory Visit: Payer: Self-pay

## 2020-12-15 ENCOUNTER — Encounter: Payer: Self-pay | Admitting: Urology

## 2020-12-15 ENCOUNTER — Ambulatory Visit (INDEPENDENT_AMBULATORY_CARE_PROVIDER_SITE_OTHER): Payer: No Typology Code available for payment source | Admitting: Urology

## 2020-12-15 VITALS — BP 102/68 | HR 87 | Ht 62.0 in | Wt 158.1 lb

## 2020-12-15 DIAGNOSIS — R109 Unspecified abdominal pain: Secondary | ICD-10-CM | POA: Diagnosis not present

## 2020-12-15 DIAGNOSIS — N390 Urinary tract infection, site not specified: Secondary | ICD-10-CM

## 2020-12-15 LAB — BLADDER SCAN AMB NON-IMAGING

## 2020-12-15 MED ORDER — CIPROFLOXACIN HCL 500 MG PO TABS
500.0000 mg | ORAL_TABLET | Freq: Two times a day (BID) | ORAL | 0 refills | Status: DC
  Filled 2020-12-15: qty 10, 5d supply, fill #0

## 2020-12-15 NOTE — Patient Instructions (Addendum)
Start cranberry tablets 1-2 times daily, avoid baths, avoid diet sodas as these can worsen urinary symptoms.  We will call with your CT results.  Urinary Tract Infection, Adult  A urinary tract infection (UTI) is an infection of any part of the urinary tract. The urinary tract includes the kidneys, ureters, bladder, and urethra. These organs make, store, and get rid of urine in the body. An upper UTI affects the ureters and kidneys. A lower UTI affects the bladder and urethra. What are the causes? Most urinary tract infections are caused by bacteria in your genital area around your urethra, where urine leaves your body. These bacteria grow and cause inflammation of your urinary tract. What increases the risk? You are more likely to develop this condition if:  You have a urinary catheter that stays in place.  You are not able to control when you urinate or have a bowel movement (incontinence).  You are female and you: ? Use a spermicide or diaphragm for birth control. ? Have low estrogen levels. ? Are pregnant.  You have certain genes that increase your risk.  You are sexually active.  You take antibiotic medicines.  You have a condition that causes your flow of urine to slow down, such as: ? An enlarged prostate, if you are female. ? Blockage in your urethra. ? A kidney stone. ? A nerve condition that affects your bladder control (neurogenic bladder). ? Not getting enough to drink, or not urinating often.  You have certain medical conditions, such as: ? Diabetes. ? A weak disease-fighting system (immunesystem). ? Sickle cell disease. ? Gout. ? Spinal cord injury. What are the signs or symptoms? Symptoms of this condition include:  Needing to urinate right away (urgency).  Frequent urination. This may include small amounts of urine each time you urinate.  Pain or burning with urination.  Blood in the urine.  Urine that smells bad or unusual.  Trouble  urinating.  Cloudy urine.  Vaginal discharge, if you are female.  Pain in the abdomen or the lower back. You may also have:  Vomiting or a decreased appetite.  Confusion.  Irritability or tiredness.  A fever or chills.  Diarrhea. The first symptom in older adults may be confusion. In some cases, they may not have any symptoms until the infection has worsened. How is this diagnosed? This condition is diagnosed based on your medical history and a physical exam. You may also have other tests, including:  Urine tests.  Blood tests.  Tests for STIs (sexually transmitted infections). If you have had more than one UTI, a cystoscopy or imaging studies may be done to determine the cause of the infections. How is this treated? Treatment for this condition includes:  Antibiotic medicine.  Over-the-counter medicines to treat discomfort.  Drinking enough water to stay hydrated. If you have frequent infections or have other conditions such as a kidney stone, you may need to see a health care provider who specializes in the urinary tract (urologist). In rare cases, urinary tract infections can cause sepsis. Sepsis is a life-threatening condition that occurs when the body responds to an infection. Sepsis is treated in the hospital with IV antibiotics, fluids, and other medicines. Follow these instructions at home: Medicines  Take over-the-counter and prescription medicines only as told by your health care provider.  If you were prescribed an antibiotic medicine, take it as told by your health care provider. Do not stop using the antibiotic even if you start to feel better. General  instructions  Make sure you: ? Empty your bladder often and completely. Do not hold urine for long periods of time. ? Empty your bladder after sex. ? Wipe from front to back after urinating or having a bowel movement if you are female. Use each tissue only one time when you wipe.  Drink enough fluid to  keep your urine pale yellow.  Keep all follow-up visits. This is important.   Contact a health care provider if:  Your symptoms do not get better after 1-2 days.  Your symptoms go away and then return. Get help right away if:  You have severe pain in your back or your lower abdomen.  You have a fever or chills.  You have nausea or vomiting. Summary  A urinary tract infection (UTI) is an infection of any part of the urinary tract, which includes the kidneys, ureters, bladder, and urethra.  Most urinary tract infections are caused by bacteria in your genital area.  Treatment for this condition often includes antibiotic medicines.  If you were prescribed an antibiotic medicine, take it as told by your health care provider. Do not stop using the antibiotic even if you start to feel better.  Keep all follow-up visits. This is important. This information is not intended to replace advice given to you by your health care provider. Make sure you discuss any questions you have with your health care provider. Document Revised: 03/07/2020 Document Reviewed: 03/07/2020 Elsevier Patient Education  2021 [employer] Inc.   Asymptomatic Bacteriuria Asymptomatic bacteriuria is the presence of a large number of bacteria in the urine without the usual symptoms of burning or frequent urination. This is usually not harmful, and treatment may not be needed. A person with this condition will not be more likely to develop an infection in the future. What are the causes? This condition is caused by an increase in bacteria in the urine. This increase can be caused by:  Bacteria entering the urinary tract, such as during sex.  A blockage in the urinary tract, such as from kidney stones or a tumor.  Bladder problems that prevent the bladder from emptying.   What increases the risk? You are more likely to develop this condition if:  You have diabetes.  You are an older adult. This especially affects  older adults in long-term care facilities.  You are pregnant and in the first trimester.  You have kidney stones.  You are female.  You have had a kidney transplant.  You have a leaky kidney tube valve (reflux).  You had a urinary catheter for a long period of time. This is a long, thin tube that collects urine. What are the signs or symptoms? There are no symptoms of this condition. How is this diagnosed? This condition is diagnosed with a urine test. Because this condition does not cause symptoms, it is usually diagnosed when a urine sample is taken to treat or diagnose another condition, such as pregnancy or kidney problems. Most women who are in their first trimester of pregnancy are screened for asymptomatic bacteriuria. How is this treated? Usually, treatment is not needed for this condition. Treating the condition can lead to other problems, such as a yeast infection or the growth of bacteria that do not respond to treatment (antibiotic-resistant bacteria). Some people do need treatment with antibiotic medicines to prevent kidney infection, known as pyelonephritis. Treatment is needed if:  You are pregnant. In pregnant women, kidney infection can lead to: ? Early labor (premature labor). ?  Very low birth weight (fetal growth restriction). ? Newborn death.  You are having a procedure that affects the urinary tract.  You have had a kidney transplant. If you are diagnosed with this condition, talk with your health care provider about any concerns that you have. Follow these instructions at home: Medicines  Take over-the-counter and prescription medicines only as told by your health care provider.  If you were prescribed an antibiotic medicine, take it as told by your health care provider. Do not stop using the antibiotic even if you start to feel better. General instructions  Monitor your condition for any changes.  Drink enough fluid to keep your urine pale  yellow.  Urinate more often to keep your bladder empty.  If you are female, keep the area around your vagina and rectum clean. Wipe from front to back after urinating or having a bowel movement. Use each piece of toilet paper only once.  Keep all follow-up visits. This is important. Contact a health care provider if:  You have symptoms of a urine infection, such as: ? A burning sensation, or pain when you urinate. ? A strong need to urinate, or urinating more often. ? Urine turning discolored or cloudy. ? Blood in your urine. ? Urine that smells bad. Get help right away if:  You develop signs of a kidney infection, such as: ? Back pain or pelvic pain. ? A fever or chills. ? Nausea or vomiting. ? Severe pain that cannot be controlled with medicine. Summary  Asymptomatic bacteriuria is the presence of a large number of bacteria in the urine without the usual symptoms of burning or frequent urination.  Usually, treatment is not needed for this condition. Treating the condition can lead to other problems, such as a yeast infection or the growth of bacteria that do not respond to treatment.  Some people do need treatment. Treatment is needed if you are pregnant, if you are having a procedure that affects the urinary tract, or if you have had a kidney transplant.  If you were prescribed an antibiotic medicine, take it as told by your health care provider. Do not stop using the antibiotic even if you start to feel better. This information is not intended to replace advice given to you by your health care provider. Make sure you discuss any questions you have with your health care provider. Document Revised: 03/07/2020 Document Reviewed: 03/07/2020 Elsevier Patient Education  2021 [employer].

## 2020-12-15 NOTE — Progress Notes (Signed)
12/15/20 1:53 PM   Katelyn Whitehead 16-Oct-1990 [id number]  CC: Recurrent UTI  HPI: I saw Katelyn Whitehead for the above issue.  She works here [employer].  She has had recurrent urinary infections with symptoms of pelvic discomfort, some dysuria, and urgency/frequency.  At baseline she has some overactive symptoms.  Recent urine culture in mid April grew > 100 K E. coli, and she was treated with antibiotics.  She has a daughter with ureteral reflux.  She has had kidney stones previously.  Most recent cross-sectional imaging was a CT in 2014 and renal ultrasound in 2012, neither of which showed hydronephrosis.  She has had some gross hematuria as well, she thinks primarily during time of infections.  She has minimal trouble with constipation.  She does not think these infections are associated with sexual activity.  She does take tub baths.  Urinalysis today appears infected again with greater than 30 WBCs, 11-30 RBCs, moderate bacteria, nitrite positive, 1+ leukocytes.  Will send for culture.   PMH: Past Medical History:  Diagnosis Date  . ADHD (attention deficit hyperactivity disorder)   . Anxiety   . Asthma    childhood  . COVID-19 virus infection 08/2020  . Depression   . Mild mitral and aortic regurgitation    a. 04/2013 Echo: EF 50-55%, mild AI, mild MR, no MVP.  Marland Kitchen Mitral valve prolapse   . Palpitations    a. 2012 Holter: sinus tach, pvc's.  . Panic disorder   . Urticaria     Surgical History: Past Surgical History:  Procedure Laterality Date  . IUD REMOVAL N/A 11/22/2016   Procedure: LAPAROCOPIC INTRAUTERINE DEVICE (IUD) REMOVAL;  Surgeon: Linzie Collin, MD;  Location: [facility] ORS;  Service: Gynecology;  Laterality: N/A;  . TYMPANOSTOMY TUBE PLACEMENT    . WISDOM TOOTH EXTRACTION      Family History: Family History  Problem Relation Age of Onset  . Depression Mother   . Diabetes Mother        type 2  . Hyperlipidemia Mother   . Hypertension Mother   . Melanoma  Mother        skin  . Asthma Mother   . Allergic rhinitis Mother   . Drug abuse Father   . Gout Father   . Depression Father   . Psychiatric Illness Father        PTSD  . Allergic rhinitis Father   . Urticaria Father   . Allergic rhinitis Sister   . ADD / ADHD Brother   . Depression Maternal Aunt   . Dementia Maternal Grandmother   . Depression Maternal Grandmother   . Heart disease Maternal Grandfather   . Diabetes Paternal Grandmother        type 2  . Hypertension Paternal Grandmother   . Crohn's disease Daughter   . Stroke Paternal Grandfather     Social History:  reports that she has never smoked. She has never used smokeless tobacco. She reports that she does not drink alcohol and does not use drugs.  Physical Exam: BP 102/68 (BP Location: Right Arm, Patient Position: Sitting, Cuff Size: Normal)   Pulse 87   Ht 5\' 2"  (1.575 m)   Wt 158 lb 1.6 oz (71.7 kg)   Breastfeeding No   BMI 28.92 kg/m    Constitutional:  Alert and oriented, No acute distress. Cardiovascular: No clubbing, cyanosis, or edema. Respiratory: Normal respiratory effort, no increased work of breathing. GI: Abdomen is soft, nontender, nondistended, no abdominal  masses  Laboratory Data: Reviewed, see HPI  Pertinent Imaging: None to review  Assessment & Plan:   30 year old female with recurrent UTIs as well as overlap with some overactive bladder symptoms.  We discussed the evaluation and treatment of patients with recurrent UTIs at length.  We specifically discussed the differences between asymptomatic bacteriuria and true urinary tract infection.  We discussed the AUA definition of recurrent UTI of at least 2 culture proven symptomatic acute cystitis episodes in a 36-month period, or 3 within a 1 year period.  We discussed the importance of culture directed antibiotic treatment, and antibiotic stewardship.  First-line therapy includes nitrofurantoin(5 days), Bactrim(3 days), or fosfomycin(3 g single  dose).  Possible etiologies of recurrent infection include periurethral tissue atrophy in postmenopausal woman, constipation, sexual activity, incomplete emptying, anatomic abnormalities, and even genetic predisposition.  Finally, we discussed the role of perineal hygiene, timed voiding, adequate hydration, topical vaginal estrogen, cranberry prophylaxis, and low-dose antibiotic prophylaxis.  -I recommended a CT without contrast for evaluation of any hydronephrosis or kidney stones with her recurrent infections and symptoms.  Call with CT results -Cranberry tablet prophylaxis for UTI prevention -Consider 3 to 11-month course of low-dose prophylaxis if recurrent infections in the future   Legrand Rams, MD 12/15/2020  [facility] [address], [address] [place], [place] [address] [phone]

## 2020-12-17 LAB — URINALYSIS, COMPLETE
Bilirubin, UA: NEGATIVE
Glucose, UA: NEGATIVE
Ketones, UA: NEGATIVE
Nitrite, UA: POSITIVE — AB
Specific Gravity, UA: 1.025 (ref 1.005–1.030)
Urobilinogen, Ur: 0.2 mg/dL (ref 0.2–1.0)
pH, UA: 6 (ref 5.0–7.5)

## 2020-12-17 LAB — MICROSCOPIC EXAMINATION: WBC, UA: >30 /hpf — AB (ref 0–5)

## 2020-12-20 LAB — CULTURE, URINE COMPREHENSIVE

## 2020-12-22 ENCOUNTER — Other Ambulatory Visit: Payer: Self-pay

## 2020-12-22 LAB — MYCOPLASMA / UREAPLASMA CULTURE
Mycoplasma hominis Culture: NEGATIVE
Ureaplasma urealyticum: NEGATIVE

## 2020-12-23 ENCOUNTER — Other Ambulatory Visit: Payer: Self-pay

## 2020-12-23 ENCOUNTER — Ambulatory Visit
Admission: RE | Admit: 2020-12-23 | Discharge: 2020-12-23 | Disposition: A | Discharge status: Home / Self Care | Payer: No Typology Code available for payment source | Source: Ambulatory Visit | Attending: Urology | Admitting: Urology

## 2020-12-23 ENCOUNTER — Ambulatory Visit
Admission: RE | Admit: 2020-12-23 | Discharge: 2020-12-23 | Disposition: A | Discharge status: Home / Self Care | Payer: No Typology Code available for payment source | Source: Ambulatory Visit | Attending: Nurse Practitioner | Admitting: Nurse Practitioner

## 2020-12-23 DIAGNOSIS — R2231 Localized swelling, mass and lump, right upper limb: Secondary | ICD-10-CM | POA: Diagnosis not present

## 2020-12-23 DIAGNOSIS — R109 Unspecified abdominal pain: Secondary | ICD-10-CM | POA: Insufficient documentation

## 2020-12-24 ENCOUNTER — Other Ambulatory Visit: Payer: Self-pay

## 2020-12-24 ENCOUNTER — Other Ambulatory Visit: Payer: Self-pay | Admitting: Urology

## 2020-12-24 MED ORDER — VYVANSE 40 MG PO CAPS
ORAL_CAPSULE | ORAL | 0 refills | Status: DC
  Filled 2020-12-24: qty 30, 30d supply, fill #0

## 2020-12-24 MED ORDER — NITROFURANTOIN MACROCRYSTAL 50 MG PO CAPS
50.0000 mg | ORAL_CAPSULE | Freq: Every day | ORAL | 0 refills | Status: DC
  Filled 2020-12-24: qty 60, 60d supply, fill #0

## 2020-12-24 NOTE — Progress Notes (Signed)
Urology telephone note  We reviewed her CT findings.  Healthy 30 year old female with recurrent UTIs.  CT shows a 1 cm right upper pole stone, and small left-sided renal stones, no hydronephrosis.  We discussed options including observation, low-dose prophylaxis, cranberry tablets, right ureteroscopy/laser/stent, or shockwave.  I discouraged her from shockwave lithotripsy with the 1400hu of the stone.  Her daughter has been sick and so she has a lot going on at home and would like to start with low-dose nitrofurantoin prophylaxis and cranberry tablets, and she will think further about right ureteroscopy, laser and stent and let us know.  Nitrofurantoin 50 mg daily x60 days Continue cranberry tablets Okay to schedule right ureteroscopy, laser lithotripsy, stent placement if patient desires for right 1 cm renal stone  Legrand Rams, MD 12/24/2020

## 2020-12-29 ENCOUNTER — Other Ambulatory Visit: Payer: Self-pay

## 2020-12-31 ENCOUNTER — Other Ambulatory Visit: Payer: Self-pay

## 2020-12-31 MED ORDER — IBUPROFEN 800 MG PO TABS
800.0000 mg | ORAL_TABLET | Freq: Three times a day (TID) | ORAL | 0 refills | Status: DC | PRN
  Filled 2020-12-31: qty 20, 7d supply, fill #0

## 2020-12-31 MED ORDER — AMOXICILLIN 875 MG PO TABS
ORAL_TABLET | ORAL | 0 refills | Status: DC
  Filled 2020-12-31: qty 11, 5d supply, fill #0

## 2021-01-09 ENCOUNTER — Other Ambulatory Visit: Payer: Self-pay

## 2021-01-09 MED FILL — Lorazepam Tab 0.5 MG: ORAL | 30 days supply | Qty: 45 | Fill #1 | Status: AC

## 2021-01-12 ENCOUNTER — Other Ambulatory Visit: Payer: Self-pay | Admitting: Nurse Practitioner

## 2021-01-12 NOTE — Telephone Encounter (Signed)
Requested medication (s) are due for refill today: no  Requested medication (s) are on the active medication list: yes  Last refill: 12/14/2020  Future visit scheduled yes  Notes to clinic:  One inhaler should last at least one month. If the patient is requesting refills earlier, contact the patient to check for uncontrolled symptoms   Requested Prescriptions  Pending Prescriptions Disp Refills   albuterol (VENTOLIN HFA) 108 (90 Base) MCG/ACT inhaler [Pharmacy Med Name: ALBUTEROL HFA (VENTOLIN) INH] 18 each 1    Sig: TAKE 2 PUFFS BY MOUTH EVERY 6 HOURS AS NEEDED FOR WHEEZE OR SHORTNESS OF BREATH      Pulmonology:  Beta Agonists Failed - 01/12/2021  1:34 AM      Failed - One inhaler should last at least one month. If the patient is requesting refills earlier, contact the patient to check for uncontrolled symptoms.      Passed - Valid encounter within last 12 months    Recent Outpatient Visits           1 month ago Frequent UTI   [facility], Jake Church, NP   4 months ago COVID-19   [facility] Caro Laroche, DO   4 months ago Rash   [facility] [place], Corrie Dandy T, NP   9 months ago Palpitations   [facility] Valentino Nose, NP   1 year ago Irritant contact dermatitis due to plants, except food   [facility] Valentino Nose, NP       Future Appointments             In 2 weeks Mc[place], Jake Church, NP [facility]

## 2021-01-12 NOTE — Telephone Encounter (Signed)
Scheduled 6/23

## 2021-01-12 NOTE — Telephone Encounter (Signed)
Routing to provider  

## 2021-01-26 ENCOUNTER — Other Ambulatory Visit: Payer: Self-pay

## 2021-01-29 ENCOUNTER — Encounter: Payer: No Typology Code available for payment source | Admitting: Nurse Practitioner

## 2021-02-02 ENCOUNTER — Other Ambulatory Visit: Payer: Self-pay | Admitting: Nurse Practitioner

## 2021-02-02 ENCOUNTER — Other Ambulatory Visit: Payer: Self-pay

## 2021-02-02 MED ORDER — ALBUTEROL SULFATE HFA 108 (90 BASE) MCG/ACT IN AERS
INHALATION_SPRAY | RESPIRATORY_TRACT | 0 refills | Status: DC
  Filled 2021-02-02: qty 18, 25d supply, fill #0

## 2021-02-02 NOTE — Telephone Encounter (Signed)
Future visit in 1 month  

## 2021-02-03 ENCOUNTER — Other Ambulatory Visit: Payer: Self-pay

## 2021-02-14 ENCOUNTER — Other Ambulatory Visit: Payer: Self-pay | Admitting: Family Medicine

## 2021-02-16 ENCOUNTER — Encounter: Payer: Self-pay | Admitting: Nurse Practitioner

## 2021-02-16 ENCOUNTER — Telehealth: Payer: Self-pay

## 2021-02-16 NOTE — Telephone Encounter (Signed)
Called pt to r/s 7/28 appt no answer vm full

## 2021-02-19 ENCOUNTER — Other Ambulatory Visit: Payer: Self-pay

## 2021-02-19 MED ORDER — VYVANSE 40 MG PO CAPS
ORAL_CAPSULE | ORAL | 0 refills | Status: DC
  Filled 2021-02-19: qty 30, 30d supply, fill #0

## 2021-02-19 MED ORDER — LORAZEPAM 0.5 MG PO TABS
ORAL_TABLET | ORAL | 0 refills | Status: DC
  Filled 2021-02-19: qty 45, 30d supply, fill #0

## 2021-02-19 MED ORDER — SERTRALINE HCL 100 MG PO TABS
ORAL_TABLET | ORAL | 1 refills | Status: DC
  Filled 2021-02-19: qty 135, 90d supply, fill #0

## 2021-02-20 ENCOUNTER — Other Ambulatory Visit: Payer: Self-pay

## 2021-02-20 ENCOUNTER — Other Ambulatory Visit: Payer: Self-pay | Admitting: Nurse Practitioner

## 2021-02-20 NOTE — Telephone Encounter (Signed)
Requested medication (s) are due for refill today: yes 03/04/21  Requested medication (s) are on the active medication list: yes  Last refill:  02/02/21 #18 g 0 refills  Future visit scheduled: no  Notes to clinic:  [facility]     Requested Prescriptions  Pending Prescriptions Disp Refills   albuterol (VENTOLIN HFA) 108 (90 Base) MCG/ACT inhaler 18 g 0    Sig: TAKE 2 PUFFS BY MOUTH EVERY 6 HOURS AS NEEDED FOR WHEEZE OR SHORTNESS OF BREATH      Pulmonology:  Beta Agonists Failed - 02/20/2021  3:49 PM      Failed - One inhaler should last at least one month. If the patient is requesting refills earlier, contact the patient to check for uncontrolled symptoms.      Passed - Valid encounter within last 12 months    Recent Outpatient Visits           2 months ago Frequent UTI   [facility], Jake Church, NP   5 months ago COVID-19   [facility] Caro Laroche, DO   6 months ago Rash   [facility] [place], Corrie Dandy T, NP   10 months ago Palpitations   [facility] Valentino Nose, NP   1 year ago Irritant contact dermatitis due to plants, except food   [facility] Valentino Nose, NP

## 2021-02-23 NOTE — Telephone Encounter (Signed)
Pt has apt on 02/24/2021 

## 2021-02-23 NOTE — Telephone Encounter (Signed)
Lvm to make this apt. 

## 2021-02-24 ENCOUNTER — Other Ambulatory Visit: Payer: Self-pay | Admitting: Nurse Practitioner

## 2021-02-24 ENCOUNTER — Encounter: Payer: Self-pay | Admitting: Nurse Practitioner

## 2021-02-24 ENCOUNTER — Telehealth (INDEPENDENT_AMBULATORY_CARE_PROVIDER_SITE_OTHER): Payer: No Typology Code available for payment source | Admitting: Nurse Practitioner

## 2021-02-24 ENCOUNTER — Other Ambulatory Visit: Payer: Self-pay

## 2021-02-24 VITALS — HR 82

## 2021-02-24 DIAGNOSIS — J45909 Unspecified asthma, uncomplicated: Secondary | ICD-10-CM | POA: Diagnosis not present

## 2021-02-24 DIAGNOSIS — N200 Calculus of kidney: Secondary | ICD-10-CM

## 2021-02-24 MED FILL — Albuterol Sulfate Inhal Aero 108 MCG/ACT (90MCG Base Equiv): RESPIRATORY_TRACT | 25 days supply | Qty: 18 | Fill #0 | Status: AC

## 2021-02-24 NOTE — Progress Notes (Signed)
Established Patient Office Visit  Subjective:  Patient ID: Katelyn Whitehead, female    DOB: Jan 25, 1991  Age: 30 y.o. MRN: [id number]  CC:  Chief Complaint  Patient presents with   Asthma    Patient states she has been using her inhaler more often due to recently getting over a cold and also having a cat in her house now.     HPI Katelyn Whitehead presents for asthma. She has been using her albuterol more frequently since she had a cold about 3 weeks ago and she got a new cat in the house. She uses her albuterol inhaler once a day if she is at work and twice a day if she is at home. She has had asthma since she was a child. She has not been on any maintenance inhalers, only albuterol prn. She takes Claritin in the morning and benadryl at night. She endorses coughing, chest tightness, wheezing, and feeling itchy during these acute episodes and the albuterol helps her symptoms. She denies chest pain and shortness of breath.    Past Medical History:  Diagnosis Date   ADHD (attention deficit hyperactivity disorder)    Anxiety    Asthma    childhood   COVID-19 virus infection 08/2020   Depression    Mild mitral and aortic regurgitation    a. 04/2013 Echo: EF 50-55%, mild AI, mild MR, no MVP.   Mitral valve prolapse    Palpitations    a. 2012 Holter: sinus tach, pvc's.   Panic disorder    Urticaria     Past Surgical History:  Procedure Laterality Date   IUD REMOVAL N/A 11/22/2016   Procedure: LAPAROCOPIC INTRAUTERINE DEVICE (IUD) REMOVAL;  Surgeon: Linzie Collin, MD;  Location: [facility] ORS;  Service: Gynecology;  Laterality: N/A;   TYMPANOSTOMY TUBE PLACEMENT     WISDOM TOOTH EXTRACTION      Family History  Problem Relation Age of Onset   Depression Mother    Diabetes Mother        type 2   Hyperlipidemia Mother    Hypertension Mother    Melanoma Mother        skin   Asthma Mother    Allergic rhinitis Mother    Drug abuse Father    Gout Father    Depression Father     Psychiatric Illness Father        PTSD   Allergic rhinitis Father    Urticaria Father    Allergic rhinitis Sister    ADD / ADHD Brother    Depression Maternal Aunt    Dementia Maternal Grandmother    Depression Maternal Grandmother    Heart disease Maternal Grandfather    Diabetes Paternal Grandmother        type 2   Hypertension Paternal Grandmother    Crohn's disease Daughter    Stroke Paternal Grandfather     Social History   Socioeconomic History   Marital status: Married    Spouse name: Not on file   Number of children: Not on file   Years of education: Not on file   Highest education level: Not on file  Occupational History   Not on file  Tobacco Use   Smoking status: Never   Smokeless tobacco: Never  Vaping Use   Vaping Use: Never used  Substance and Sexual Activity   Alcohol use: No    Alcohol/week: 0.0 standard drinks   Drug use: No   Sexual activity: Yes  Birth control/protection: Pill  Other Topics Concern   Not on file  Social History Narrative   Not on file   Social Determinants of Health   Financial Resource Strain: Not on file  Food Insecurity: Not on file  Transportation Needs: Not on file  Physical Activity: Not on file  Stress: Not on file  Social Connections: Not on file  Intimate Partner Violence: Not on file    Outpatient Medications Prior to Visit  Medication Sig Dispense Refill   acetaminophen (TYLENOL) 500 MG tablet Take 500 mg by mouth every 6 (six) hours as needed.     diphenhydrAMINE HCl (BENADRYL ALLERGY PO) Take by mouth as needed.      Drospirenone (SLYND) 4 MG TABS Take 4 mg by mouth daily. 28 tablet 11   ibuprofen (ADVIL) 800 MG tablet Take 1 tablet (800 mg total) by mouth every 8 (eight) hours as needed. 20 tablet 0   lisdexamfetamine (VYVANSE) 40 MG capsule Take 1 capsule (40 mg total) by mouth every morning. 30 capsule 0   LORazepam (ATIVAN) 0.5 MG tablet Take 1/2 in morning and 1/2 to 1 tab in evening as needed for  high anxiety 45 tablet 0   nitrofurantoin (MACRODANTIN) 50 MG capsule Take 1 capsule (50 mg total) by mouth daily. 60 capsule 0   propranolol (INDERAL) 20 MG tablet TAKE 1 TABLET BY MOUTH 2 TIMES DAILY AS NEEDED (PALPITATIONS). 60 tablet 2   sertraline (ZOLOFT) 100 MG tablet Take 1.5 tablets (150 mg total) by mouth in the morning. 135 tablet 1   albuterol (VENTOLIN HFA) 108 (90 Base) MCG/ACT inhaler TAKE 2 PUFFS BY MOUTH EVERY 6 HOURS AS NEEDED FOR WHEEZE OR SHORTNESS OF BREATH 18 g 0   lisdexamfetamine (VYVANSE) 30 MG capsule Take 1 capsule (30 mg total) by mouth every morning. (Patient not taking: Reported on 02/24/2021) 30 capsule 0   sertraline (ZOLOFT) 100 MG tablet TAKE 1 & 1/2 TABLETS BY MOUTH DAILY (Patient not taking: No sig reported) 60 tablet 1   triamcinolone (KENALOG) 0.1 % APPLY TO THE AFFECTED AREA(S) 2 TIMES DAILY (Patient not taking: No sig reported) 30 g 2   amoxicillin (AMOXIL) 875 MG tablet Take 1 tablet by mouth three times daily today, then 1 tablet twice a day until gone. (Patient not taking: Reported on 02/24/2021) 11 tablet 0   ciprofloxacin (CIPRO) 500 MG tablet Take 1 tablet (500 mg total) by mouth every 12 (twelve) hours. (Patient not taking: Reported on 02/24/2021) 10 tablet 0   No facility-administered medications prior to visit.    Allergies  Allergen Reactions   Influenza Vaccines Hives   Sulfa Antibiotics     Hives Rash    ROS Review of Systems  Constitutional: Negative.   Respiratory:  Positive for cough (intermittent), chest tightness (intermittent) and wheezing (intermittent). Negative for shortness of breath.   Cardiovascular: Negative.   Gastrointestinal:  Positive for abdominal pain (has kidney stone, f/u with urology tomorrow).  Genitourinary:  Positive for flank pain. Negative for difficulty urinating and dysuria.  Neurological: Negative.      Objective:    Physical Exam Vitals and nursing note reviewed.  HENT:     Head: Normocephalic.   Eyes:     Conjunctiva/sclera: Conjunctivae normal.  Pulmonary:     Effort: Pulmonary effort is normal.     Comments: Able to talk in complete sentences Neurological:     Mental Status: She is alert and oriented to person, place, and time.  Psychiatric:  Mood and Affect: Mood normal.        Behavior: Behavior normal.        Thought Content: Thought content normal.        Judgment: Judgment normal.    Pulse 82   SpO2 95%  Wt Readings from Last 3 Encounters:  12/15/20 158 lb 1.6 oz (71.7 kg)  11/27/20 159 lb 9.6 oz (72.4 kg)  08/20/20 164 lb 12.8 oz (74.8 kg)     Health Maintenance Due  Topic Date Due   COVID-19 Vaccine (3 - Booster for Pfizer series) 12/06/2020    There are no preventive care reminders to display for this patient.  Lab Results  Component Value Date   TSH 1.543 09/13/2019   Lab Results  Component Value Date   WBC 5.6 08/20/2020   HGB 13.8 08/20/2020   HCT 41.1 08/20/2020   MCV 83 08/20/2020   PLT 365 08/20/2020   Lab Results  Component Value Date   NA 140 08/20/2020   K 3.8 08/20/2020   CO2 22 08/20/2020   GLUCOSE 89 08/20/2020   BUN 18 08/20/2020   CREATININE 0.84 08/20/2020   BILITOT 0.4 09/13/2019   ALKPHOS 77 09/13/2019   AST 20 09/13/2019   ALT 21 09/13/2019   PROT 7.5 09/13/2019   ALBUMIN 3.8 09/13/2019   CALCIUM 9.7 08/20/2020   ANIONGAP 8 06/19/2020   Lab Results  Component Value Date   CHOL 172 09/10/2019   Lab Results  Component Value Date   HDL 41 09/10/2019   Lab Results  Component Value Date   LDLCALC 116 (H) 09/10/2019   Lab Results  Component Value Date   TRIG 79 09/10/2019   No results found for: CHOLHDL Lab Results  Component Value Date   HGBA1C 5.5 09/13/2019      Assessment & Plan:   Problem List Items Addressed This Visit       Respiratory   Persistent asthma without complication - Primary    Had asthma diagnosed when she was a child. She recently is getting over a cold and has a cat in  her house which has been worsening her symptoms. Previously she was controlled with albuterol prn. Continue claritin daily and benadryl prn. Will refill her albuterol. She is due for a physical and will perform spirometry at this visit. May need daily maintenance inhaler. Return sooner if symptoms worsen.        Other Visit Diagnoses     Kidney stone       States she was diagnosed with kidney stone and has follow-up with urology tomorrow. Will await and review their note.        No orders of the defined types were placed in this encounter.   Follow-up: Return in about 1 week (around 03/03/2021) for physical with spirometry.    This visit was completed via MyChart due to the restrictions of the COVID-19 pandemic. All issues as above were discussed and addressed. Physical exam was done as above through visual confirmation on MyChart. If it was felt that the patient should be evaluated in the office, they were directed there. The patient verbally consented to this visit. Location of the patient: home Location of the provider: work Those involved with this call:  Provider: Rodman Pickle, DNP CMA: Rolley Sims, CMA Front Desk/Registration: Harriet Pho  Time spent on call:  10 minutes with patient face to face via video conference. More than 50% of this time was spent in counseling and coordination of  care. 10 minutes total spent in review of patient's record and preparation of their chart.   Gerre Scull, NP

## 2021-02-24 NOTE — Assessment & Plan Note (Signed)
Had asthma diagnosed when she was a child. She recently is getting over a cold and has a cat in her house which has been worsening her symptoms. Previously she was controlled with albuterol prn. Continue claritin daily and benadryl prn. Will refill her albuterol. She is due for a physical and will perform spirometry at this visit. May need daily maintenance inhaler. Return sooner if symptoms worsen.

## 2021-02-24 NOTE — Telephone Encounter (Signed)
Possible duplicate pt scheduled for an appt today

## 2021-02-24 NOTE — Telephone Encounter (Signed)
   Notes to clinic: Failed protocol: One inhaler should last at least one month. If the patient is requesting refills earlier, contact the patient to check for uncontrolled symptoms Patient has appt today    Requested Prescriptions  Pending Prescriptions Disp Refills   albuterol (VENTOLIN HFA) 108 (90 Base) MCG/ACT inhaler 18 g 0    Sig: TAKE 2 PUFFS BY MOUTH EVERY 6 HOURS AS NEEDED FOR WHEEZE OR SHORTNESS OF BREATH      Pulmonology:  Beta Agonists Failed - 02/24/2021  8:11 AM      Failed - One inhaler should last at least one month. If the patient is requesting refills earlier, contact the patient to check for uncontrolled symptoms.      Passed - Valid encounter within last 12 months    Recent Outpatient Visits           2 months ago Frequent UTI   [facility], Jake Church, NP   5 months ago COVID-19   [facility] Caro Laroche, DO   6 months ago Rash   [facility], Corrie Dandy T, NP   10 months ago Palpitations   [facility] Valentino Nose, NP   1 year ago Irritant contact dermatitis due to plants, except food   [facility] Valentino Nose, NP       Future Appointments             Today Gerre Scull, NP [facility]

## 2021-02-25 ENCOUNTER — Ambulatory Visit
Admission: RE | Admit: 2021-02-25 | Discharge: 2021-02-25 | Disposition: A | Discharge status: Home / Self Care | Payer: No Typology Code available for payment source | Attending: Urology | Admitting: Urology

## 2021-02-25 ENCOUNTER — Other Ambulatory Visit: Payer: Self-pay

## 2021-02-25 ENCOUNTER — Ambulatory Visit (INDEPENDENT_AMBULATORY_CARE_PROVIDER_SITE_OTHER): Payer: No Typology Code available for payment source | Admitting: Urology

## 2021-02-25 ENCOUNTER — Ambulatory Visit
Admission: RE | Admit: 2021-02-25 | Discharge: 2021-02-25 | Disposition: A | Discharge status: Home / Self Care | Payer: No Typology Code available for payment source | Source: Ambulatory Visit | Attending: Urology | Admitting: Urology

## 2021-02-25 ENCOUNTER — Other Ambulatory Visit: Payer: Self-pay | Admitting: Urology

## 2021-02-25 ENCOUNTER — Encounter: Payer: Self-pay | Admitting: Urology

## 2021-02-25 VITALS — BP 113/71 | HR 99 | Ht 62.0 in | Wt 155.0 lb

## 2021-02-25 DIAGNOSIS — N390 Urinary tract infection, site not specified: Secondary | ICD-10-CM

## 2021-02-25 DIAGNOSIS — N200 Calculus of kidney: Secondary | ICD-10-CM

## 2021-02-25 MED ORDER — CIPROFLOXACIN HCL 500 MG PO TABS
500.0000 mg | ORAL_TABLET | Freq: Two times a day (BID) | ORAL | 0 refills | Status: DC
  Filled 2021-02-25: qty 10, 5d supply, fill #0

## 2021-02-25 NOTE — Progress Notes (Signed)
   02/25/2021 12:47 PM   Katelyn Whitehead Jan 06, 1991 [id number]  Reason for visit: Right flank pain  HPI: Healthy 30 year old female with recurrent UTIs and a known 1 cm right upper pole stone who was previously deferred intervention.  She was added on to clinic today with 1 week of right-sided flank pain that has been intermittent and severe.  She has some urinary frequency but no dysuria, pelvic pain, fevers, or chills.  I personally viewed and interpreted her KUB today which shows migration of the 1 cm right upper pole stone to the right UPJ.  Urinalysis today with 3-10 RBCs, greater than 30 WBCs, many bacteria, nitrite positive, 2+ leukocytes.  Will send for culture.  She has been on nitrofurantoin prophylaxis.  We discussed various treatment options for urolithiasis including observation with or without medical expulsive therapy, shockwave lithotripsy (SWL), ureteroscopy and laser lithotripsy with stent placement, and percutaneous nephrolithotomy.  We discussed that management is based on stone size, location, density, patient co-morbidities, and patient preference.   Stones <19mm in size have a >80% spontaneous passage rate. Data surrounding the use of tamsulosin for medical expulsive therapy is controversial, but meta analyses suggests it is most efficacious for distal stones between 5-48mm in size. Possible side effects include dizziness/lightheadedness, and retrograde ejaculation.  SWL has a lower stone free rate in a single procedure, but also a lower complication rate compared to ureteroscopy and avoids a stent and associated stent related symptoms. Possible complications include renal hematoma, steinstrasse, and need for additional treatment.  Shockwave is not a great option for her with her suspicious urine and very dense stone.  Ureteroscopy with laser lithotripsy and stent placement has a higher stone free rate than SWL in a single procedure, however increased complication rate  including possible infection, ureteral injury, bleeding, and stent related morbidity. Common stent related symptoms include dysuria, urgency/frequency, and flank pain.  We discussed the risk of possible UTI in the setting of an obstructing stone, including pyelonephritis, sepsis.  We discussed that typically this is managed with urgent ureteral stent placement with delayed management.  She would like to avoid a staged procedure if at all possible, and would like to try a course of antibiotics followed by definitive ureteroscopy this week.  Return precautions were discussed extensively.  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  We discussed possible need for staged treatment regardless if she is found to have any purulence Intra-Op or fevers.  Cipro 500 mg twice daily x5 days, follow-up culture results Schedule right ureteroscopy, laser lithotripsy, stent placement this Friday   Sondra Come, MD  [facility] [address] [place], [place] [address] [phone]

## 2021-02-25 NOTE — H&P (View-Only) (Signed)
   02/25/2021 12:47 PM   Katelyn Whitehead 06/03/1991 [id number]  Reason for visit: Right flank pain  HPI: Healthy 29-year-old female with recurrent UTIs and a known 1 cm right upper pole stone who was previously deferred intervention.  She was added on to clinic today with 1 week of right-sided flank pain that has been intermittent and severe.  She has some urinary frequency but no dysuria, pelvic pain, fevers, or chills.  I personally viewed and interpreted her KUB today which shows migration of the 1 cm right upper pole stone to the right UPJ.  Urinalysis today with 3-10 RBCs, greater than 30 WBCs, many bacteria, nitrite positive, 2+ leukocytes.  Will send for culture.  She has been on nitrofurantoin prophylaxis.  We discussed various treatment options for urolithiasis including observation with or without medical expulsive therapy, shockwave lithotripsy (SWL), ureteroscopy and laser lithotripsy with stent placement, and percutaneous nephrolithotomy.  We discussed that management is based on stone size, location, density, patient co-morbidities, and patient preference.   Stones <5mm in size have a >80% spontaneous passage rate. Data surrounding the use of tamsulosin for medical expulsive therapy is controversial, but meta analyses suggests it is most efficacious for distal stones between 5-10mm in size. Possible side effects include dizziness/lightheadedness, and retrograde ejaculation.  SWL has a lower stone free rate in a single procedure, but also a lower complication rate compared to ureteroscopy and avoids a stent and associated stent related symptoms. Possible complications include renal hematoma, steinstrasse, and need for additional treatment.  Shockwave is not a great option for her with her suspicious urine and very dense stone.  Ureteroscopy with laser lithotripsy and stent placement has a higher stone free rate than SWL in a single procedure, however increased complication rate  including possible infection, ureteral injury, bleeding, and stent related morbidity. Common stent related symptoms include dysuria, urgency/frequency, and flank pain.  We discussed the risk of possible UTI in the setting of an obstructing stone, including pyelonephritis, sepsis.  We discussed that typically this is managed with urgent ureteral stent placement with delayed management.  She would like to avoid a staged procedure if at all possible, and would like to try a course of antibiotics followed by definitive ureteroscopy this week.  Return precautions were discussed extensively.  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  We discussed possible need for staged treatment regardless if she is found to have any purulence Intra-Op or fevers.  Cipro 500 mg twice daily x5 days, follow-up culture results Schedule right ureteroscopy, laser lithotripsy, stent placement this Friday   Katelyn Harvel C Leonette Tischer, MD  [facility] [address], [address] [place], [place] [address] [phone]   

## 2021-02-25 NOTE — Patient Instructions (Signed)
Laser Therapy for Kidney Stones Laser therapy for kidney stones is a procedure to break up small, hard mineral deposits that form in the kidney (kidney stones). The procedure is done using a device that produces a focused beam of light (laser). The laser breaks up kidney stones into pieces that are small enough to be passed out of the body through urination or removed from the body during the procedure. You may need laser therapy if you have kidney stones that arepainful or block your urinary tract. This procedure is done by inserting a tube (ureteroscope) into your kidney through the urethral opening. The urethra is the part of the body that drains urine from the bladder. In women, the urethra opens above the vaginal opening. In men, the urethra opens at the tip of the penis. The ureteroscope is inserted through the urethra, and surgical instruments are moved through the bladder and the muscular tube that connects the kidney to the bladder (ureter) until they reach the kidney. Tell a health care provider about: Any allergies you have. All medicines you are taking, including vitamins, herbs, eye drops, creams, and over-the-counter medicines. Any problems you or family members have had with anesthetic medicines. Any blood disorders you have. Any surgeries you have had. Any medical conditions you have. Whether you are pregnant or may be pregnant. What are the risks? Generally, this is a safe procedure. However, problems may occur, including: Infection. Bleeding. Allergic reactions to medicines. Damage to the urethra, bladder, or ureter. Urinary tract infection (UTI). Narrowing of the urethra (urethral stricture). Difficulty passing urine. Blockage of the kidney caused by a fragment of kidney stone. What happens before the procedure? Medicines Ask your health care provider about: Changing or stopping your regular medicines. This is especially important if you are taking diabetes medicines or  blood thinners. Taking medicines such as aspirin and ibuprofen. These medicines can thin your blood. Do not take these medicines unless your health care provider tells you to take them. Taking over-the-counter medicines, vitamins, herbs, and supplements. Eating and drinking Follow instructions from your health care provider about eating and drinking, which may include: 8 hours before the procedure - stop eating heavy meals or foods, such as meat, fried foods, or fatty foods. 6 hours before the procedure - stop eating light meals or foods, such as toast or cereal. 6 hours before the procedure - stop drinking milk or drinks that contain milk. 2 hours before the procedure - stop drinking clear liquids. Staying hydrated Follow instructions from your health care provider about hydration, which may include: Up to 2 hours before the procedure - you may continue to drink clear liquids, such as water, clear fruit juice, black coffee, and plain tea.  General instructions You may have a physical exam before the procedure. You may also have tests, such as imaging tests and blood or urine tests. If your ureter is too narrow, your health care provider may place a soft, flexible tube (stent) inside of it. The stent may be placed days or weeks before your laser therapy procedure. Plan to have someone take you home from the hospital or clinic. If you will be going home right after the procedure, plan to have someone stay with you for 24 hours. Do not use any products that contain nicotine or tobacco for at least 4 weeks before the procedure. These products include cigarettes, e-cigarettes, and chewing tobacco. If you need help quitting, ask your health care provider. Ask your health care provider: How your surgical   site will be marked or identified. What steps will be taken to help prevent infection. These may include: Removing hair at the surgery site. Washing skin with a germ-killing soap. Taking antibiotic  medicine. What happens during the procedure?  An IV will be inserted into one of your veins. You will be given one or more of the following: A medicine to help you relax (sedative). A medicine to numb the area (local anesthetic). A medicine to make you fall asleep (general anesthetic). A ureteroscope will be inserted into your urethra. The ureteroscope will send images to a video screen in the operating room to guide your surgeon to the area of your kidney that will be treated. A small, flexible tube will be threaded through the ureteroscope and into your bladder and ureter, up to your kidney. The laser device will be inserted into your kidney through the tube. Your surgeon will pulse the laser on and off to break up kidney stones. A surgical instrument that has a tiny wire basket may be inserted through the tube into your kidney to remove the pieces of broken kidney stone. The procedure may vary among health care providers and hospitals. What happens after the procedure? Your blood pressure, heart rate, breathing rate, and blood oxygen level will be monitored until you leave the hospital or clinic. You will be given pain medicine as needed. You may continue to receive antibiotics. You may have a stent temporarily placed in your ureter. Do not drive for 24 hours if you were given a sedative during your procedure. You may be given a strainer to collect any stone fragments that you pass in your urine. Your health care provider may have these tested. Summary Laser therapy for kidney stones is a procedure to break up kidney stones into pieces that are small enough to be passed out of the body through urination or removed during the procedure. Follow instructions from your health care provider about eating and drinking before the procedure. During the procedure, the ureteroscope will send images to a video screen to guide your surgeon to the area of your kidney that will be treated. Do not drive  for 24 hours if you were given a sedative during your procedure. This information is not intended to replace advice given to you by your health care provider. Make sure you discuss any questions you have with your healthcare provider. Document Revised: 04/06/2018 Document Reviewed: 04/06/2018 Elsevier Patient Education  2022 [employer]  Ureteral Stent Implantation  Ureteral stent implantation is a procedure to insert (implant) a flexible, soft, plastic tube (stent) into a ureter. Ureters are the tube-like parts of the body that drain urine from the kidneys. The stent supports the ureter while it heals and helps to drain urine. You may have a ureteral stent implanted after having a procedure to remove a blockage from the ureter (ureterolysis or pyeloplasty). You may also have a stent implanted to open the flow of urine when you havea blockage caused by a kidney stone, tumor, blood clot, or infection. You have two ureters, one on each side of the body. The ureters connect the kidneys to the organ that holds urine until it passes out of the body (bladder). The stent is placed so that one end is in the kidney, and one end is in the bladder. The stent is usually taken out after your ureter has healed. Depending on your condition, you may have a stent for just a few weeks, or you may have along-term stent   that will need to be replaced every few months. Tell a health care provider about: Any allergies you have. All medicines you are taking, including vitamins, herbs, eye drops, creams, and over-the-counter medicines. Any problems you or family members have had with anesthetic medicines. Any blood disorders you have. Any surgeries you have had. Any medical conditions you have. Whether you are pregnant or may be pregnant. What are the risks? Generally, this is a safe procedure. However, problems may occur, including: Infection. Bleeding. Allergic reactions to medicines. Damage to other structures or  organs. Tearing (perforation) of the ureter is possible. Movement of the stent away from where it is placed during surgery (migration). What happens before the procedure? Medicines Ask your health care provider about: Changing or stopping your regular medicines. This is especially important if you are taking diabetes medicines or blood thinners. Taking medicines such as aspirin and ibuprofen. These medicines can thin your blood. Do not take these medicines unless your health care provider tells you to take them. Taking over-the-counter medicines, vitamins, herbs, and supplements. Eating and drinking Follow instructions from your health care provider about eating and drinking, which may include: 8 hours before the procedure - stop eating heavy meals or foods, such as meat, fried foods, or fatty foods. 6 hours before the procedure - stop eating light meals or foods, such as toast or cereal. 6 hours before the procedure - stop drinking milk or drinks that contain milk. 2 hours before the procedure - stop drinking clear liquids. Staying hydrated Follow instructions from your health care provider about hydration, which may include: Up to 2 hours before the procedure - you may continue to drink clear liquids, such as water, clear fruit juice, black coffee, and plain tea. General instructions Do not drink alcohol. Do not use any products that contain nicotine or tobacco for at least 4 weeks before the procedure. These products include cigarettes, e-cigarettes, and chewing tobacco. If you need help quitting, ask your health care provider. You may have an exam or testing, such as imaging or blood tests. Ask your health care provider what steps will be taken to help prevent infection. These may include: Removing hair at the surgery site. Washing skin with a germ-killing soap. Taking antibiotic medicine. Plan to have someone take you home from the hospital or clinic. If you will be going home right  after the procedure, plan to have someone with you for 24 hours. What happens during the procedure? An IV will be inserted into one of your veins. You may be given a medicine to help you relax (sedative). You may be given a medicine to make you fall asleep (general anesthetic). A thin, tube-shaped instrument with a light and tiny camera at the end (cystoscope) will be inserted into your urethra. The urethra is the tube that drains urine from the bladder out of the body. In men, the urethra opens at the end of the penis. In women, the urethra opens in front of the vaginal opening. The cystoscope will be passed into your bladder. A thin wire (guide wire) will be passed through your bladder and into your ureter. This is used to guide the stent into your ureter. The stent will be inserted into your ureter. The guide wire and the cystoscope will be removed. A flexible tube (catheter) may be inserted through your urethra so that one end is in your bladder. This helps to drain urine from your bladder. The procedure may vary among hospitals and health care providers.   What happens after the procedure? Your blood pressure, heart rate, breathing rate, and blood oxygen level will be monitored until you leave the hospital or clinic. You may continue to receive medicine and fluids through an IV. You may have some soreness or pain in your abdomen and urethra. Medicines will be available to help you. You will be encouraged to get up and walk around as soon as you can. You may have a catheter draining your urine. You will have some blood in your urine. Do not drive for 24 hours if you were given a sedative during your procedure. Summary Ureteral stent implantation is a procedure to insert a flexible, soft, plastic tube (stent) into a ureter. You may have a stent implanted to support the ureter while it heals after a procedure or to open the flow of urine if there is a blockage. Follow instructions from your  health care provider about taking medicines and about eating and drinking before the procedure. Depending on your condition, you may have a stent for just a few weeks, or you may have a long-term stent that will need to be replaced every few months. This information is not intended to replace advice given to you by your health care provider. Make sure you discuss any questions you have with your healthcare provider. Document Revised: 05/02/2018 Document Reviewed: 05/03/2018 Elsevier Patient Education  2022 [employer] Inc.  

## 2021-02-26 ENCOUNTER — Inpatient Hospital Stay: Admission: RE | Admit: 2021-02-26 | Payer: No Typology Code available for payment source | Source: Ambulatory Visit

## 2021-02-26 ENCOUNTER — Ambulatory Visit: Payer: No Typology Code available for payment source

## 2021-02-26 ENCOUNTER — Encounter: Payer: Self-pay | Admitting: Urology

## 2021-02-26 ENCOUNTER — Other Ambulatory Visit: Payer: Self-pay

## 2021-02-26 ENCOUNTER — Ambulatory Visit
Admission: RE | Admit: 2021-02-26 | Discharge: 2021-02-26 | Disposition: A | Discharge status: Home / Self Care | Payer: No Typology Code available for payment source | Attending: Urology | Admitting: Urology

## 2021-02-26 ENCOUNTER — Ambulatory Visit: Payer: No Typology Code available for payment source | Admitting: Anesthesiology

## 2021-02-26 ENCOUNTER — Telehealth: Payer: Self-pay | Admitting: Urology

## 2021-02-26 ENCOUNTER — Encounter
Admission: RE | Disposition: A | Discharge status: Home / Self Care | Payer: Self-pay | Source: Home / Self Care | Attending: Urology

## 2021-02-26 DIAGNOSIS — N200 Calculus of kidney: Secondary | ICD-10-CM

## 2021-02-26 DIAGNOSIS — N390 Urinary tract infection, site not specified: Secondary | ICD-10-CM | POA: Diagnosis not present

## 2021-02-26 DIAGNOSIS — J45909 Unspecified asthma, uncomplicated: Secondary | ICD-10-CM | POA: Insufficient documentation

## 2021-02-26 DIAGNOSIS — N132 Hydronephrosis with renal and ureteral calculous obstruction: Secondary | ICD-10-CM | POA: Insufficient documentation

## 2021-02-26 DIAGNOSIS — R509 Fever, unspecified: Secondary | ICD-10-CM | POA: Diagnosis not present

## 2021-02-26 DIAGNOSIS — N1330 Unspecified hydronephrosis: Secondary | ICD-10-CM | POA: Diagnosis not present

## 2021-02-26 DIAGNOSIS — N201 Calculus of ureter: Secondary | ICD-10-CM | POA: Diagnosis not present

## 2021-02-26 HISTORY — PX: CYSTOSCOPY/URETEROSCOPY/HOLMIUM LASER/STENT PLACEMENT: SHX6546

## 2021-02-26 LAB — CBC
HCT: 39.8 % (ref 36.0–46.0)
Hemoglobin: 13.1 g/dL (ref 12.0–15.0)
MCH: 27.7 pg (ref 26.0–34.0)
MCHC: 32.9 g/dL (ref 30.0–36.0)
MCV: 84.1 fL (ref 80.0–100.0)
Platelets: 305 10*3/uL (ref 150–400)
RBC: 4.73 MIL/uL (ref 3.87–5.11)
RDW: 12.9 % (ref 11.5–15.5)
WBC: 10.7 10*3/uL — ABNORMAL HIGH (ref 4.0–10.5)
nRBC: 0 % (ref 0.0–0.2)

## 2021-02-26 LAB — POCT PREGNANCY, URINE: Preg Test, Ur: NEGATIVE

## 2021-02-26 SURGERY — CYSTOSCOPY/URETEROSCOPY/HOLMIUM LASER/STENT PLACEMENT
Anesthesia: General | Laterality: Right

## 2021-02-26 MED ORDER — PROPOFOL 10 MG/ML IV BOLUS
INTRAVENOUS | Status: DC | PRN
  Administered 2021-02-26: 150 mg via INTRAVENOUS

## 2021-02-26 MED ORDER — OXYBUTYNIN CHLORIDE 5 MG PO TABS
5.0000 mg | ORAL_TABLET | Freq: Once | ORAL | Status: AC
  Administered 2021-02-26: 5 mg via ORAL
  Filled 2021-02-26: qty 1

## 2021-02-26 MED ORDER — ONDANSETRON HCL 4 MG/2ML IJ SOLN
INTRAMUSCULAR | Status: DC | PRN
  Administered 2021-02-26: 4 mg via INTRAVENOUS

## 2021-02-26 MED ORDER — ACETAMINOPHEN 10 MG/ML IV SOLN
INTRAVENOUS | Status: DC | PRN
  Administered 2021-02-26: 1000 mg via INTRAVENOUS

## 2021-02-26 MED ORDER — OXYBUTYNIN CHLORIDE ER 10 MG PO TB24
10.0000 mg | ORAL_TABLET | Freq: Every day | ORAL | Status: DC
  Filled 2021-02-26: qty 1

## 2021-02-26 MED ORDER — PHENYLEPHRINE HCL (PRESSORS) 10 MG/ML IV SOLN
INTRAVENOUS | Status: DC | PRN
  Administered 2021-02-26 (×3): 100 ug via INTRAVENOUS

## 2021-02-26 MED ORDER — ROCURONIUM BROMIDE 10 MG/ML (PF) SYRINGE
PREFILLED_SYRINGE | INTRAVENOUS | Status: AC
  Filled 2021-02-26: qty 10

## 2021-02-26 MED ORDER — MIDAZOLAM HCL 2 MG/2ML IJ SOLN
INTRAMUSCULAR | Status: AC
  Filled 2021-02-26: qty 2

## 2021-02-26 MED ORDER — KETOROLAC TROMETHAMINE 30 MG/ML IJ SOLN
INTRAMUSCULAR | Status: DC | PRN
  Administered 2021-02-26: 30 mg via INTRAVENOUS

## 2021-02-26 MED ORDER — FENTANYL CITRATE (PF) 100 MCG/2ML IJ SOLN
INTRAMUSCULAR | Status: DC | PRN
  Administered 2021-02-26 (×2): 50 ug via INTRAVENOUS

## 2021-02-26 MED ORDER — CHLORHEXIDINE GLUCONATE 0.12 % MT SOLN
OROMUCOSAL | Status: AC
  Administered 2021-02-26: 15 mL via OROMUCOSAL
  Filled 2021-02-26: qty 15

## 2021-02-26 MED ORDER — OXYCODONE HCL 5 MG PO TABS
5.0000 mg | ORAL_TABLET | Freq: Once | ORAL | Status: AC | PRN
  Administered 2021-02-26: 5 mg via ORAL

## 2021-02-26 MED ORDER — ACETAMINOPHEN 10 MG/ML IV SOLN
INTRAVENOUS | Status: AC
  Filled 2021-02-26: qty 100

## 2021-02-26 MED ORDER — LIDOCAINE HCL (PF) 2 % IJ SOLN
INTRAMUSCULAR | Status: AC
  Filled 2021-02-26: qty 5

## 2021-02-26 MED ORDER — LACTATED RINGERS IV SOLN: INTRAVENOUS | Status: DC | PRN

## 2021-02-26 MED ORDER — SODIUM CHLORIDE 0.9 % IR SOLN
Status: DC | PRN
Start: 2021-02-26 — End: 2021-02-26
  Administered 2021-02-26: 1000 mL

## 2021-02-26 MED ORDER — FENTANYL CITRATE (PF) 100 MCG/2ML IJ SOLN: 25.0000 ug | INTRAMUSCULAR | Status: DC | PRN

## 2021-02-26 MED ORDER — SODIUM CHLORIDE 0.9 % IV SOLN: Freq: Once | INTRAVENOUS | Status: AC

## 2021-02-26 MED ORDER — OXYCODONE HCL 5 MG PO TABS
ORAL_TABLET | ORAL | Status: AC
  Filled 2021-02-26: qty 1

## 2021-02-26 MED ORDER — BELLADONNA ALKALOIDS-OPIUM 16.2-60 MG RE SUPP
RECTAL | Status: AC
  Filled 2021-02-26: qty 1

## 2021-02-26 MED ORDER — CHLORHEXIDINE GLUCONATE 0.12 % MT SOLN: 15.0000 mL | Freq: Once | OROMUCOSAL | Status: AC

## 2021-02-26 MED ORDER — LACTATED RINGERS IV SOLN: INTRAVENOUS | Status: DC

## 2021-02-26 MED ORDER — DEXAMETHASONE SODIUM PHOSPHATE 10 MG/ML IJ SOLN
INTRAMUSCULAR | Status: DC | PRN
  Administered 2021-02-26: 10 mg via INTRAVENOUS

## 2021-02-26 MED ORDER — FENTANYL CITRATE (PF) 100 MCG/2ML IJ SOLN
INTRAMUSCULAR | Status: AC
  Filled 2021-02-26: qty 2

## 2021-02-26 MED ORDER — OXYCODONE HCL 5 MG/5ML PO SOLN: 5.0000 mg | Freq: Once | ORAL | Status: AC | PRN

## 2021-02-26 MED ORDER — ORAL CARE MOUTH RINSE: 15.0000 mL | Freq: Once | OROMUCOSAL | Status: AC

## 2021-02-26 MED ORDER — CEPHALEXIN 500 MG PO CAPS
500.0000 mg | ORAL_CAPSULE | Freq: Two times a day (BID) | ORAL | 0 refills | Status: DC
  Filled 2021-02-26: qty 14, 7d supply, fill #0

## 2021-02-26 MED ORDER — MIDAZOLAM HCL 2 MG/2ML IJ SOLN
INTRAMUSCULAR | Status: DC | PRN
  Administered 2021-02-26: 2 mg via INTRAVENOUS

## 2021-02-26 MED ORDER — HYDROCODONE-ACETAMINOPHEN 5-325 MG PO TABS
1.0000 | ORAL_TABLET | Freq: Four times a day (QID) | ORAL | 0 refills | Status: AC | PRN
  Filled 2021-02-26: qty 10, 3d supply, fill #0

## 2021-02-26 MED ORDER — PROMETHAZINE HCL 25 MG/ML IJ SOLN: 6.2500 mg | INTRAMUSCULAR | Status: DC | PRN

## 2021-02-26 MED ORDER — SODIUM CHLORIDE 0.9 % IV SOLN
2.0000 g | Freq: Once | INTRAVENOUS | Status: AC
  Administered 2021-02-26: 2 g via INTRAVENOUS
  Filled 2021-02-26: qty 2

## 2021-02-26 MED ORDER — IOHEXOL 180 MG/ML  SOLN
INTRAMUSCULAR | Status: DC | PRN
  Administered 2021-02-26: 10 mL

## 2021-02-26 MED ORDER — LIDOCAINE HCL (CARDIAC) PF 100 MG/5ML IV SOSY
PREFILLED_SYRINGE | INTRAVENOUS | Status: DC | PRN
  Administered 2021-02-26: 60 mg via INTRAVENOUS

## 2021-02-26 SURGICAL SUPPLY — 30 items
BAG DRAIN CYSTO-URO LG1000N (MISCELLANEOUS) ×2 IMPLANT
BRUSH SCRUB EZ 1% IODOPHOR (MISCELLANEOUS) ×2 IMPLANT
CATH URET FLEX-TIP 2 LUMEN 10F (CATHETERS) ×2 IMPLANT
CATH URETL 5X70 OPEN END (CATHETERS) IMPLANT
CNTNR SPEC 2.5X3XGRAD LEK (MISCELLANEOUS)
CONT SPEC 4OZ STER OR WHT (MISCELLANEOUS)
CONT SPEC 4OZ STRL OR WHT (MISCELLANEOUS)
CONTAINER SPEC 2.5X3XGRAD LEK (MISCELLANEOUS) IMPLANT
DRAPE UTILITY 15X26 TOWEL STRL (DRAPES) ×2 IMPLANT
DRSG TEGADERM 2-3/8X2-3/4 SM (GAUZE/BANDAGES/DRESSINGS) ×2 IMPLANT
GAUZE 4X4 16PLY ~~LOC~~+RFID DBL (SPONGE) ×4 IMPLANT
GLOVE SURG UNDER POLY LF SZ7.5 (GLOVE) ×2 IMPLANT
GOWN STRL REUS W/ TWL LRG LVL3 (GOWN DISPOSABLE) ×1 IMPLANT
GOWN STRL REUS W/ TWL XL LVL3 (GOWN DISPOSABLE) ×1 IMPLANT
GOWN STRL REUS W/TWL LRG LVL3 (GOWN DISPOSABLE) ×2
GOWN STRL REUS W/TWL XL LVL3 (GOWN DISPOSABLE) ×2
GUIDEWIRE STR DUAL SENSOR (WIRE) ×2 IMPLANT
INFUSOR MANOMETER BAG 3000ML (MISCELLANEOUS) ×2 IMPLANT
IV NS IRRIG 3000ML ARTHROMATIC (IV SOLUTION) ×2 IMPLANT
KIT TURNOVER CYSTO (KITS) ×2 IMPLANT
PACK CYSTO AR (MISCELLANEOUS) ×2 IMPLANT
SET CYSTO W/LG BORE CLAMP LF (SET/KITS/TRAYS/PACK) ×2 IMPLANT
SHEATH URETERAL 12FRX35CM (MISCELLANEOUS) ×2 IMPLANT
STENT URET 6FRX24 CONTOUR (STENTS) ×2 IMPLANT
STENT URET 6FRX26 CONTOUR (STENTS) IMPLANT
SURGILUBE 2OZ TUBE FLIPTOP (MISCELLANEOUS) ×2 IMPLANT
SYR 10ML LL (SYRINGE) ×2 IMPLANT
TRACTIP FLEXIVA PULSE ID 200 (Laser) IMPLANT
VALVE UROSEAL ADJ ENDO (VALVE) IMPLANT
WATER STERILE IRR 1000ML POUR (IV SOLUTION) ×2 IMPLANT

## 2021-02-26 NOTE — Telephone Encounter (Signed)
Urology telephone note  Patient with right flank pain and KUB suggesting a 1 cm right proximal ureteral stone.  Seen in clinic yesterday and was afebrile and hemodynamically stable with no fevers or chills, urinalysis suspicious for infection was started on Cipro.  She called the clinic this morning with new fever to 101.9 overnight and flulike symptoms.  We discussed the need for drainage in the setting of an infected and obstructed system.  A ureteral stent is a small plastic tube that is placed cystoscopically with one end in the kidney and the other end in the bladder that allows the infection from the kidney to drain, and relieves pain from the obstructing stone.  We discussed the risks at length including bleeding, infection, sepsis, death, ureteral injury, and stent related symptoms including urgency/frequency/dysuria/flank pain/gross hematuria.  There is a low, but not 0, risk of inability to pass the ureteral stent alongside the stone from below which would require percutaneous nephrostomy tube by interventional radiology.  Finally, we discussed possible prolonged hospitalization and recovery, possible temporary Foley catheter placement, and 10 to 14-day course of antibiotics.  We reviewed the need for a follow-up procedure for definitive management of their stone when the infection has been treated in 2 to 3 weeks with either ureteroscopy/laser lithotripsy.  Discussed with patient and OR, plan for cystoscopy and right ureteral stent placement today at 1130.  We discussed she may require admission if she remains febrile or hemodynamically unstable postoperatively.  Legrand Rams, MD 02/26/2021

## 2021-02-26 NOTE — Anesthesia Procedure Notes (Signed)
Procedure Name: LMA Insertion Date/Time: 02/26/2021 12:29 PM Performed by: Hermenia Bers, CRNA Pre-anesthesia Checklist: Patient identified, Patient being monitored, Timeout performed, Emergency Drugs available and Suction available Patient Re-evaluated:Patient Re-evaluated prior to induction Oxygen Delivery Method: Circle system utilized Preoxygenation: Pre-oxygenation with 100% oxygen Induction Type: IV induction Ventilation: Mask ventilation without difficulty LMA: LMA inserted LMA Size: 4.0 Tube type: Oral Number of attempts: 1 Placement Confirmation: positive ETCO2 and breath sounds checked- equal and bilateral Tube secured with: Tape Dental Injury: Teeth and Oropharynx as per pre-operative assessment

## 2021-02-26 NOTE — Anesthesia Preprocedure Evaluation (Addendum)
Anesthesia Evaluation  Patient identified by MRN, date of birth, ID band Patient awake    Reviewed: Allergy & Precautions, NPO status , Patient's Chart, lab work & pertinent test results  Airway Mallampati: II       Dental no notable dental hx.    Pulmonary asthma ,    Pulmonary exam normal        Cardiovascular Normal cardiovascular exam+ Valvular Problems/Murmurs AI and MR   History of anxiety, panic attacks, palpitations, presyncope felt to beorthostasis in the setting of dehydration. Inappropriatetachycardia.     Neuro/Psych Anxiety Depression    GI/Hepatic negative GI ROS, Neg liver ROS,   Endo/Other  negative endocrine ROS  Renal/GU negative Renal ROS     Musculoskeletal negative musculoskeletal ROS (+)   Abdominal Normal abdominal exam  (+)   Peds  Hematology negative hematology ROS (+)   Anesthesia Other Findings   Reproductive/Obstetrics negative OB ROS                             Anesthesia Physical  Anesthesia Plan  ASA: II  Anesthesia Plan: General   Post-op Pain Management:    Induction: Intravenous  PONV Risk Score and Plan: 2 and Ondansetron and Dexamethasone  Airway Management Planned: Oral ETT  Additional Equipment:   Intra-op Plan:   Post-operative Plan: Extubation in OR  Informed Consent: I have reviewed the patients History and Physical, chart, labs and discussed the procedure including the risks, benefits and alternatives for the proposed anesthesia with the patient or authorized representative who has indicated his/her understanding and acceptance.       Plan Discussed with: CRNA, Surgeon and Anesthesiologist  Anesthesia Plan Comments:        Anesthesia Quick Evaluation

## 2021-02-26 NOTE — Op Note (Signed)
Date of procedure: 02/26/21  Preoperative diagnosis:  Right proximal ureteral stone UTI   Postoperative diagnosis:  Same  Procedure: Cystoscopy, right retrograde pyelogram with intraoperative interpretation, right ureteral stent placement  Surgeon: Legrand Rams, MD  Anesthesia: General  Complications: None  Intraoperative findings:  Erythematous bladder consistent with UTI Uncomplicated right ureteral stent placement with hydronephrotic drip, no significant purulence  EBL: None  Specimens: None  Drains: Right 6 French by 24 cm ureteral stent  Indication: Katelyn Whitehead is a 30 y.o. patient with 1 cm right proximal ureteral stone and UTI with fever of 102.  We discussed the need for urgent stent placement for drainage of an infected and obstructed system, need for definitive management in 2 to 3 weeks.  After reviewing the management options for treatment, they elected to proceed with the above surgical procedure(s). We have discussed the potential benefits and risks of the procedure, side effects of the proposed treatment, the likelihood of the patient achieving the goals of the procedure, and any potential problems that might occur during the procedure or recuperation. Informed consent has been obtained.  Description of procedure:  The patient was taken to the operating room and general anesthesia was induced. SCDs were placed for DVT prophylaxis. The patient was placed in the dorsal lithotomy position, prepped and draped in the usual sterile fashion, and preoperative antibiotics(ceftriaxone) were administered. A preoperative time-out was performed.   A 21 French rigid cystoscope was used to intubate the urethra and thorough cystoscopy was performed.  The ureteral orifices were orthotopic bilaterally.  The bladder was erythematous consistent with UTI.  A right retrograde pyelogram showed an obstructing 1 cm proximal ureteral stone with upstream hydronephrosis.  With the aid of  an access catheter, I was able to navigate a sensor wire into the collecting system.  The wire was removed, and a hydronephrotic drip of clear urine was noted.  The wire was replaced, and a 6 [place] by 24 cm ureteral stent was uneventfully placed with a curl in the renal pelvis, as well as under direct vision the bladder.  Urine was seen to drain through the side ports and alongside the stent.  The bladder was drained, and a belladonna suppository was placed, and this concluded procedure.  Disposition: Stable to PACU  Plan: Okay for discharge home today if afebrile and hemodynamically stable in PACU Keflex 500 mg twice daily x7 days, call with culture results Will schedule right ureteroscopy, laser, stent in 2 to 3 weeks after infection treated  Legrand Rams, MD

## 2021-02-26 NOTE — Interval H&P Note (Signed)
UROLOGY H&P UPDATE  Right 1 cm ureteral stone on KUB and new fevers with urinalysis suspicious for infection.  Plan for cystoscopy and right ureteral stent placement with delayed management of her stone.  We discussed possible need for admission if she is febrile or tachycardic.  Cardiac: RRR Lungs: CTA bilaterally  Laterality: Right Procedure: Cystoscopy and right ureteral stent placement  We discussed the need for drainage in the setting of an infected and obstructed system.  A ureteral stent is a small plastic tube that is placed cystoscopically with one end in the kidney and the other end in the bladder that allows the infection from the kidney to drain, and relieves pain from the obstructing stone.  We discussed the risks at length including bleeding, infection, sepsis, death, ureteral injury, and stent related symptoms including urgency/frequency/dysuria/flank pain/gross hematuria.  There is a low, but not 0, risk of inability to pass the ureteral stent alongside the stone from below which would require percutaneous nephrostomy tube by interventional radiology.  Finally, we discussed possible prolonged hospitalization and recovery, possible temporary Foley catheter placement, and 10 to 14-day course of antibiotics.  We reviewed the need for a follow-up procedure for definitive management of their stone when the infection has been treated in 2 to 3 weeks with either ureteroscopy/laser lithotripsy.   Sondra Come, MD 02/26/2021

## 2021-02-26 NOTE — Transfer of Care (Signed)
Immediate Anesthesia Transfer of Care Note  Patient: Katelyn Whitehead  Procedure(s) Performed: CYSTOSCOPY/URETEROSCOPY, RIGHT STENT PLACEMENT (Right)  Patient Location: PACU  Anesthesia Type:General  Level of Consciousness: awake, alert  and oriented  Airway & Oxygen Therapy: Patient Spontanous Breathing and Patient connected to face mask oxygen  Post-op Assessment: Report given to RN and Post -op Vital signs reviewed and stable  Post vital signs: Reviewed and stable  Last Vitals:  Vitals Value Taken Time  BP 105/76 02/26/21 1300  Temp    Pulse 93 02/26/21 1302  Resp 21 02/26/21 1302  SpO2 99 % 02/26/21 1302  Vitals shown include unvalidated device data.  Last Pain:  Vitals:   02/26/21 1200  TempSrc: Oral  PainSc:          Complications: No notable events documented.

## 2021-02-26 NOTE — Discharge Instructions (Signed)

## 2021-02-27 ENCOUNTER — Other Ambulatory Visit: Payer: Self-pay

## 2021-02-27 ENCOUNTER — Encounter: Payer: Self-pay | Admitting: Urology

## 2021-02-27 ENCOUNTER — Telehealth: Payer: Self-pay | Admitting: Urology

## 2021-02-27 DIAGNOSIS — N200 Calculus of kidney: Secondary | ICD-10-CM

## 2021-02-27 LAB — URINALYSIS, COMPLETE
Bilirubin, UA: NEGATIVE
Glucose, UA: NEGATIVE
Ketones, UA: NEGATIVE
Nitrite, UA: POSITIVE — AB
Protein,UA: NEGATIVE
Specific Gravity, UA: 1.025 (ref 1.005–1.030)
Urobilinogen, Ur: 0.2 mg/dL (ref 0.2–1.0)
pH, UA: 7 (ref 5.0–7.5)

## 2021-02-27 LAB — MICROSCOPIC EXAMINATION: WBC, UA: >30 /hpf — AB (ref 0–5)

## 2021-02-27 MED ORDER — OXYBUTYNIN CHLORIDE ER 10 MG PO TB24: 10.0000 mg | ORAL_TABLET | Freq: Every day | ORAL | 0 refills | Status: DC

## 2021-02-27 MED ORDER — TAMSULOSIN HCL 0.4 MG PO CAPS: 0.4000 mg | ORAL_CAPSULE | Freq: Every day | ORAL | 0 refills | Status: DC

## 2021-02-27 MED ORDER — OXYBUTYNIN CHLORIDE ER 10 MG PO TB24
10.0000 mg | ORAL_TABLET | Freq: Every day | ORAL | 0 refills | Status: DC
  Filled 2021-02-27: qty 30, 30d supply, fill #0

## 2021-02-27 MED ORDER — TAMSULOSIN HCL 0.4 MG PO CAPS
0.4000 mg | ORAL_CAPSULE | Freq: Every day | ORAL | 0 refills | Status: DC
  Filled 2021-02-27: qty 30, 30d supply, fill #0

## 2021-02-27 NOTE — Telephone Encounter (Signed)
I called pt. To schedule surgery and she said to let Dr. Richardo Hanks know that she was having some kind of reaction to the Keflex which included a red,flushed face so she discontinued the medication in fear of an allergic reaction. Pt. States she started taking the Cipro even though it causes nausea she would rather take that instead of the Keflex.

## 2021-02-28 NOTE — Anesthesia Postprocedure Evaluation (Signed)
Anesthesia Post Note  Patient: Katelyn Whitehead  Procedure(s) Performed: CYSTOSCOPY/URETEROSCOPY, RIGHT STENT PLACEMENT (Right)  Patient location during evaluation: PACU Anesthesia Type: General Level of consciousness: awake and alert Pain management: pain level controlled Vital Signs Assessment: post-procedure vital signs reviewed and stable Respiratory status: spontaneous breathing, nonlabored ventilation and respiratory function stable Cardiovascular status: blood pressure returned to baseline and stable Postop Assessment: no apparent nausea or vomiting Anesthetic complications: no   No notable events documented.   Last Vitals:  Vitals:   02/26/21 1505 02/26/21 1522  BP:  106/60  Pulse: (!) 104 78  Resp:  16  Temp: 36.9 C 36.8 C  SpO2: 98% 96%    Last Pain:  Vitals:   02/27/21 0849  TempSrc:   PainSc: 3                  Foye Deer

## 2021-03-01 LAB — CULTURE, URINE COMPREHENSIVE

## 2021-03-03 ENCOUNTER — Other Ambulatory Visit: Payer: Self-pay

## 2021-03-04 ENCOUNTER — Other Ambulatory Visit: Payer: Self-pay | Admitting: Urology

## 2021-03-04 DIAGNOSIS — N200 Calculus of kidney: Secondary | ICD-10-CM

## 2021-03-04 NOTE — Patient Instructions (Addendum)
Your procedure is scheduled on: Friday February 17, 2021. Report to Day Surgery inside [facility] 2nd floor. To find out your arrival time please call [phone] between 1PM - 3PM on Thursday February 16, 2021.  Remember: Instructions that are not followed completely may result in serious medical risk,  up to and including death, or upon the discretion of your surgeon and anesthesiologist your  surgery may need to be rescheduled.     _X__ 1. Do not eat food or drink fluids after midnight the night before your procedure.                 No chewing gum or hard candies.   __X__2.  On the morning of surgery brush your teeth with toothpaste and water, you                may rinse your mouth with mouthwash if you wish.  Do not swallow any toothpaste of mouthwash.     _X__ 3.  No Alcohol for 24 hours before or after surgery.   _X__ 4.  Do Not Smoke or use e-cigarettes For 24 Hours Prior to Your Surgery.                 Do not use any chewable tobacco products for at least 6 hours prior to                 Surgery.  _X__  5.  Do not use any recreational drugs (marijuana, cocaine, heroin, ecstasy, MDMA or other)                For at least one week prior to your surgery.  Combination of these drugs with anesthesia                May have life threatening results.   __X__ 6.  Notify your doctor if there is any change in your medical condition      (cold, fever, infections).     Do not wear jewelry, make-up, hairpins, clips or nail polish. Do not wear lotions, powders, or perfumes. You may wear deodorant. Do not shave 48 hours prior to surgery. Men may shave face and neck. Do not bring valuables to the hospital.    [facility] is not responsible for any belongings or valuables.  Contacts, dentures or bridgework may not be worn into surgery. Leave your suitcase in the car. After surgery it may be brought to your room. For patients admitted to the hospital, discharge time  is determined by your treatment team.   Patients discharged the day of surgery will not be allowed to drive home.   Make arrangements for someone to be with you for the first 24 hours of your Same Day Discharge.   __X__ Take these medicines the morning of surgery with A SIP OF WATER:    1. sertraline (ZOLOFT) 100 MG   2. lisdexamfetamine (VYVANSE) 40 MG   3. tamsulosin (FLOMAX) 0.4 MG   4. cephALEXin (KEFLEX) 500 MG   5.  6.  ____ Fleet Enema (as directed)   ____ Use CHG Soap (or wipes) as directed    ____ Use Benzoyl Peroxide Gel as instructed  __X__ Use inhalers on the day of surgery  albuterol (VENTOLIN HFA) 108 (90 Base) MCG/ACT inhaler  ____ Stop metformin 2 days prior to surgery    ____ Take 1/2 of usual insulin dose the night before surgery. No insulin the morning  of surgery.   ____ Call your PCP, cardiologist, or Pulmonologist if taking Coumadin/Plavix/aspirin and ask when to stop before your surgery.   __X__ One Week prior to surgery- Stop Anti-inflammatories such as Ibuprofen, Aleve, Advil, Motrin, meloxicam (MOBIC), diclofenac, etodolac, ketorolac, Toradol, Daypro, piroxicam, Goody's or BC powders. OK TO USE TYLENOL IF NEEDED   __X__ Stop supplements until after surgery.    ____ Bring C-Pap to the hospital.    If you have any questions regarding your pre-procedure instructions,  Please call Pre-admit Testing at [phone].

## 2021-03-05 ENCOUNTER — Encounter: Payer: No Typology Code available for payment source | Admitting: Nurse Practitioner

## 2021-03-16 ENCOUNTER — Other Ambulatory Visit: Payer: Self-pay

## 2021-03-16 ENCOUNTER — Telehealth: Payer: Self-pay

## 2021-03-16 DIAGNOSIS — N200 Calculus of kidney: Secondary | ICD-10-CM

## 2021-03-16 NOTE — Telephone Encounter (Signed)
Pt called asking to drop off urine. Appt made for 3:30. Pt aware.

## 2021-03-17 ENCOUNTER — Other Ambulatory Visit: Payer: Self-pay

## 2021-03-17 ENCOUNTER — Telehealth: Payer: Self-pay

## 2021-03-17 ENCOUNTER — Other Ambulatory Visit: Payer: No Typology Code available for payment source

## 2021-03-17 DIAGNOSIS — N200 Calculus of kidney: Secondary | ICD-10-CM

## 2021-03-17 DIAGNOSIS — N390 Urinary tract infection, site not specified: Secondary | ICD-10-CM

## 2021-03-17 LAB — URINALYSIS, COMPLETE
Bilirubin, UA: NEGATIVE
Glucose, UA: NEGATIVE
Ketones, UA: NEGATIVE
Nitrite, UA: NEGATIVE
Specific Gravity, UA: 1.02 (ref 1.005–1.030)
Urobilinogen, Ur: 0.2 mg/dL (ref 0.2–1.0)
pH, UA: 6 (ref 5.0–7.5)

## 2021-03-17 LAB — MICROSCOPIC EXAMINATION: RBC, Urine: >30 /hpf — AB (ref 0–2)

## 2021-03-17 MED ORDER — NITROFURANTOIN MONOHYD MACRO 100 MG PO CAPS: 100.0000 mg | ORAL_CAPSULE | Freq: Two times a day (BID) | ORAL | 0 refills | Status: AC

## 2021-03-17 NOTE — Telephone Encounter (Signed)
-----   Message from Sondra Come, MD sent at 03/17/2021 10:18 AM EDT ----- Regarding: abx Lets do nitrofurantoin 100 mg twice daily x5 days for possible UTI.  Urine does not look that concerning, but do not want to have to cancel surgery if she ended up with fever or developing UTI  Thanks Legrand Rams, MD 03/17/2021

## 2021-03-17 NOTE — Telephone Encounter (Signed)
RX sent in. See mychart encounter.

## 2021-03-20 ENCOUNTER — Ambulatory Visit: Payer: No Typology Code available for payment source | Admitting: Registered Nurse

## 2021-03-20 ENCOUNTER — Ambulatory Visit: Payer: No Typology Code available for payment source

## 2021-03-20 ENCOUNTER — Encounter: Payer: Self-pay | Admitting: Urology

## 2021-03-20 ENCOUNTER — Ambulatory Visit
Admission: RE | Admit: 2021-03-20 | Discharge: 2021-03-20 | Disposition: A | Discharge status: Home / Self Care | Payer: No Typology Code available for payment source | Attending: Urology | Admitting: Urology

## 2021-03-20 ENCOUNTER — Telehealth: Payer: Self-pay | Admitting: Urology

## 2021-03-20 ENCOUNTER — Encounter
Admission: RE | Disposition: A | Discharge status: Home / Self Care | Payer: Self-pay | Source: Home / Self Care | Attending: Urology

## 2021-03-20 ENCOUNTER — Other Ambulatory Visit: Payer: Self-pay

## 2021-03-20 DIAGNOSIS — N201 Calculus of ureter: Secondary | ICD-10-CM | POA: Diagnosis present

## 2021-03-20 DIAGNOSIS — N202 Calculus of kidney with calculus of ureter: Secondary | ICD-10-CM | POA: Insufficient documentation

## 2021-03-20 DIAGNOSIS — N200 Calculus of kidney: Secondary | ICD-10-CM | POA: Diagnosis not present

## 2021-03-20 HISTORY — PX: CYSTOSCOPY/URETEROSCOPY/HOLMIUM LASER/STENT PLACEMENT: SHX6546

## 2021-03-20 LAB — CULTURE, URINE COMPREHENSIVE

## 2021-03-20 LAB — POCT PREGNANCY, URINE: Preg Test, Ur: NEGATIVE

## 2021-03-20 SURGERY — CYSTOSCOPY/URETEROSCOPY/HOLMIUM LASER/STENT PLACEMENT
Anesthesia: General | Laterality: Right

## 2021-03-20 MED ORDER — LACTATED RINGERS IV SOLN: INTRAVENOUS | Status: DC

## 2021-03-20 MED ORDER — DEXAMETHASONE SODIUM PHOSPHATE 10 MG/ML IJ SOLN
INTRAMUSCULAR | Status: DC | PRN
  Administered 2021-03-20: 10 mg via INTRAVENOUS

## 2021-03-20 MED ORDER — ACETAMINOPHEN 10 MG/ML IV SOLN
INTRAVENOUS | Status: AC
  Filled 2021-03-20: qty 100

## 2021-03-20 MED ORDER — CHLORHEXIDINE GLUCONATE 0.12 % MT SOLN
OROMUCOSAL | Status: AC
  Administered 2021-03-20: 15 mL via OROMUCOSAL
  Filled 2021-03-20: qty 15

## 2021-03-20 MED ORDER — AMOXICILLIN 500 MG PO CAPS
500.0000 mg | ORAL_CAPSULE | Freq: Every day | ORAL | 0 refills | Status: DC
  Filled 2021-03-20: qty 5, 5d supply, fill #0

## 2021-03-20 MED ORDER — PROPOFOL 500 MG/50ML IV EMUL
INTRAVENOUS | Status: AC
  Filled 2021-03-20: qty 50

## 2021-03-20 MED ORDER — IOHEXOL 180 MG/ML  SOLN
INTRAMUSCULAR | Status: DC | PRN
  Administered 2021-03-20: 10 mL

## 2021-03-20 MED ORDER — PROPOFOL 10 MG/ML IV BOLUS
INTRAVENOUS | Status: DC | PRN
  Administered 2021-03-20: 150 mg via INTRAVENOUS

## 2021-03-20 MED ORDER — LACTATED RINGERS IV SOLN: INTRAVENOUS | Status: DC | PRN

## 2021-03-20 MED ORDER — FAMOTIDINE 20 MG PO TABS
ORAL_TABLET | ORAL | Status: AC
  Administered 2021-03-20: 20 mg via ORAL
  Filled 2021-03-20: qty 1

## 2021-03-20 MED ORDER — BELLADONNA ALKALOIDS-OPIUM 16.2-60 MG RE SUPP
RECTAL | Status: AC
  Filled 2021-03-20: qty 1

## 2021-03-20 MED ORDER — PHENYLEPHRINE HCL (PRESSORS) 10 MG/ML IV SOLN
INTRAVENOUS | Status: DC | PRN
  Administered 2021-03-20: 100 ug via INTRAVENOUS

## 2021-03-20 MED ORDER — LIDOCAINE HCL URETHRAL/MUCOSAL 2 % EX GEL
CUTANEOUS | Status: AC
  Filled 2021-03-20: qty 10

## 2021-03-20 MED ORDER — MIDAZOLAM HCL 2 MG/2ML IJ SOLN
INTRAMUSCULAR | Status: AC
  Filled 2021-03-20: qty 2

## 2021-03-20 MED ORDER — ROCURONIUM BROMIDE 100 MG/10ML IV SOLN
INTRAVENOUS | Status: DC | PRN
  Administered 2021-03-20: 40 mg via INTRAVENOUS

## 2021-03-20 MED ORDER — FENTANYL CITRATE (PF) 100 MCG/2ML IJ SOLN
INTRAMUSCULAR | Status: DC | PRN
  Administered 2021-03-20: 50 ug via INTRAVENOUS

## 2021-03-20 MED ORDER — ORAL CARE MOUTH RINSE: 15.0000 mL | Freq: Once | OROMUCOSAL | Status: AC

## 2021-03-20 MED ORDER — LIDOCAINE HCL URETHRAL/MUCOSAL 2 % EX GEL
CUTANEOUS | Status: DC | PRN
  Administered 2021-03-20: 1

## 2021-03-20 MED ORDER — ESMOLOL HCL 100 MG/10ML IV SOLN
INTRAVENOUS | Status: DC | PRN
  Administered 2021-03-20: 10 mg via INTRAVENOUS

## 2021-03-20 MED ORDER — CHLORHEXIDINE GLUCONATE 0.12 % MT SOLN: 15.0000 mL | Freq: Once | OROMUCOSAL | Status: AC

## 2021-03-20 MED ORDER — FENTANYL CITRATE (PF) 100 MCG/2ML IJ SOLN
INTRAMUSCULAR | Status: AC
  Filled 2021-03-20: qty 2

## 2021-03-20 MED ORDER — ONDANSETRON HCL 4 MG/2ML IJ SOLN
INTRAMUSCULAR | Status: DC | PRN
  Administered 2021-03-20: 4 mg via INTRAVENOUS

## 2021-03-20 MED ORDER — DEXMEDETOMIDINE (PRECEDEX) IN NS 20 MCG/5ML (4 MCG/ML) IV SYRINGE
PREFILLED_SYRINGE | INTRAVENOUS | Status: DC | PRN
  Administered 2021-03-20: 8 ug via INTRAVENOUS

## 2021-03-20 MED ORDER — GLYCOPYRROLATE 0.2 MG/ML IJ SOLN
INTRAMUSCULAR | Status: DC | PRN
Start: 2021-03-20 — End: 2021-03-20
  Administered 2021-03-20: .2 mg via INTRAVENOUS

## 2021-03-20 MED ORDER — FAMOTIDINE 20 MG PO TABS: 20.0000 mg | ORAL_TABLET | Freq: Once | ORAL | Status: AC

## 2021-03-20 MED ORDER — CEFAZOLIN SODIUM-DEXTROSE 2-4 GM/100ML-% IV SOLN
INTRAVENOUS | Status: AC
  Filled 2021-03-20: qty 100

## 2021-03-20 MED ORDER — LIDOCAINE HCL (PF) 2 % IJ SOLN
INTRAMUSCULAR | Status: DC | PRN
  Administered 2021-03-20: 100 mg

## 2021-03-20 MED ORDER — CEFAZOLIN SODIUM-DEXTROSE 2-4 GM/100ML-% IV SOLN
2.0000 g | INTRAVENOUS | Status: AC
  Administered 2021-03-20: 2 g via INTRAVENOUS

## 2021-03-20 MED ORDER — SUGAMMADEX SODIUM 200 MG/2ML IV SOLN
INTRAVENOUS | Status: DC | PRN
  Administered 2021-03-20: 400 mg via INTRAVENOUS

## 2021-03-20 MED ORDER — ACETAMINOPHEN 10 MG/ML IV SOLN
INTRAVENOUS | Status: DC | PRN
  Administered 2021-03-20: 1000 mg via INTRAVENOUS

## 2021-03-20 MED ORDER — HYDROCODONE-ACETAMINOPHEN 5-325 MG PO TABS
1.0000 | ORAL_TABLET | Freq: Four times a day (QID) | ORAL | 0 refills | Status: AC | PRN
  Filled 2021-03-20: qty 8, 2d supply, fill #0

## 2021-03-20 MED ORDER — KETOROLAC TROMETHAMINE 30 MG/ML IJ SOLN
INTRAMUSCULAR | Status: DC | PRN
  Administered 2021-03-20: 15 mg via INTRAVENOUS

## 2021-03-20 MED ORDER — LIDOCAINE HCL (CARDIAC) PF 100 MG/5ML IV SOSY
PREFILLED_SYRINGE | INTRAVENOUS | Status: DC | PRN
  Administered 2021-03-20: 70 mg via INTRAVENOUS

## 2021-03-20 MED ORDER — BELLADONNA ALKALOIDS-OPIUM 16.2-60 MG RE SUPP
RECTAL | Status: DC | PRN
  Administered 2021-03-20: 1 via RECTAL

## 2021-03-20 MED FILL — Albuterol Sulfate Inhal Aero 108 MCG/ACT (90MCG Base Equiv): RESPIRATORY_TRACT | 25 days supply | Qty: 18 | Fill #1 | Status: AC

## 2021-03-20 SURGICAL SUPPLY — 33 items
ADH LQ OCL WTPRF AMP STRL LF (MISCELLANEOUS) ×1
ADHESIVE MASTISOL STRL (MISCELLANEOUS) ×2 IMPLANT
BAG DRAIN CYSTO-URO LG1000N (MISCELLANEOUS) ×2 IMPLANT
BRUSH SCRUB EZ 1% IODOPHOR (MISCELLANEOUS) ×2 IMPLANT
CATH URET FLEX-TIP 2 LUMEN 10F (CATHETERS) IMPLANT
CATH URETL OPEN 5X70 (CATHETERS) IMPLANT
CNTNR SPEC 2.5X3XGRAD LEK (MISCELLANEOUS)
CONT SPEC 4OZ STER OR WHT (MISCELLANEOUS)
CONT SPEC 4OZ STRL OR WHT (MISCELLANEOUS)
CONTAINER SPEC 2.5X3XGRAD LEK (MISCELLANEOUS) IMPLANT
DRAPE UTILITY 15X26 TOWEL STRL (DRAPES) ×2 IMPLANT
DRSG TEGADERM 2-3/8X2-3/4 SM (GAUZE/BANDAGES/DRESSINGS) ×4 IMPLANT
FIBER LASER MOSES 200 DFL (Laser) ×2 IMPLANT
GAUZE 4X4 16PLY ~~LOC~~+RFID DBL (SPONGE) ×2 IMPLANT
GLOVE SURG UNDER POLY LF SZ7.5 (GLOVE) ×2 IMPLANT
GOWN STRL REUS W/ TWL LRG LVL3 (GOWN DISPOSABLE) ×1 IMPLANT
GOWN STRL REUS W/ TWL XL LVL3 (GOWN DISPOSABLE) ×1 IMPLANT
GOWN STRL REUS W/TWL LRG LVL3 (GOWN DISPOSABLE) ×2
GOWN STRL REUS W/TWL XL LVL3 (GOWN DISPOSABLE) ×2
GUIDEWIRE STR DUAL SENSOR (WIRE) ×4 IMPLANT
INFUSOR MANOMETER BAG 3000ML (MISCELLANEOUS) ×2 IMPLANT
IV NS IRRIG 3000ML ARTHROMATIC (IV SOLUTION) ×2 IMPLANT
KIT TURNOVER CYSTO (KITS) ×2 IMPLANT
PACK CYSTO AR (MISCELLANEOUS) ×2 IMPLANT
SET CYSTO W/LG BORE CLAMP LF (SET/KITS/TRAYS/PACK) ×2 IMPLANT
SHEATH URETERAL 12FRX35CM (MISCELLANEOUS) IMPLANT
STENT URET 6FRX24 CONTOUR (STENTS) ×2 IMPLANT
STENT URET 6FRX26 CONTOUR (STENTS) IMPLANT
SURGILUBE 2OZ TUBE FLIPTOP (MISCELLANEOUS) ×2 IMPLANT
SYR 10ML LL (SYRINGE) ×2 IMPLANT
TRACTIP FLEXIVA PULSE ID 200 (Laser) IMPLANT
VALVE UROSEAL ADJ ENDO (VALVE) ×2 IMPLANT
WATER STERILE IRR 1000ML POUR (IV SOLUTION) ×2 IMPLANT

## 2021-03-20 NOTE — Anesthesia Preprocedure Evaluation (Addendum)
Anesthesia Evaluation  Patient identified by MRN, date of birth, ID band Patient awake    Reviewed: Allergy & Precautions, NPO status , Patient's Chart, lab work & pertinent test results  Airway Mallampati: II       Dental no notable dental hx.    Pulmonary asthma ,    Pulmonary exam normal        Cardiovascular Exercise Tolerance: Good Normal cardiovascular exam+ Valvular Problems/Murmurs AI and MR   History of anxiety, panic attacks, palpitations, presyncope felt to beorthostasis in the setting of dehydration. Inappropriatetachycardia.     Neuro/Psych PSYCHIATRIC DISORDERS Anxiety Depression    GI/Hepatic negative GI ROS, Neg liver ROS,   Endo/Other  negative endocrine ROS  Renal/GU negative Renal ROS     Musculoskeletal negative musculoskeletal ROS (+)   Abdominal Normal abdominal exam  (+)   Peds  Hematology negative hematology ROS (+)   Anesthesia Other Findings   Reproductive/Obstetrics negative OB ROS                            Anesthesia Physical  Anesthesia Plan  ASA: II  Anesthesia Plan: General   Post-op Pain Management:    Induction: Intravenous  PONV Risk Score and Plan: 2 and Ondansetron, Dexamethasone and Midazolam  Airway Management Planned: Oral ETT  Additional Equipment:   Intra-op Plan:   Post-operative Plan: Extubation in OR  Informed Consent: I have reviewed the patients History and Physical, chart, labs and discussed the procedure including the risks, benefits and alternatives for the proposed anesthesia with the patient or authorized representative who has indicated his/her understanding and acceptance.       Plan Discussed with: CRNA, Surgeon and Anesthesiologist  Anesthesia Plan Comments:        Anesthesia Quick Evaluation

## 2021-03-20 NOTE — H&P (Signed)
03/20/21 7:09 AM   Katelyn Whitehead 1991-07-01 [id number]  CC: Right ureteral stone   HPI: 30 year old female who previously presented with a 1 cm right UPJ stone and fever to 102 and underwent urgent stent placement.  Here today for definitive management with ureteroscopy, laser lithotripsy, and stent placement.   PMH: Past Medical History:  Diagnosis Date   ADHD (attention deficit hyperactivity disorder)    Anxiety    Asthma    childhood   COVID-19 virus infection 08/2020   Depression    Mild mitral and aortic regurgitation    a. 04/2013 Echo: EF 50-55%, mild AI, mild MR, no MVP.   Mitral valve prolapse    Palpitations    a. 2012 Holter: sinus tach, pvc's.   Panic disorder    Urticaria     Surgical History: Past Surgical History:  Procedure Laterality Date   CYSTOSCOPY/URETEROSCOPY/HOLMIUM LASER/STENT PLACEMENT Right 02/26/2021   Procedure: CYSTOSCOPY/URETEROSCOPY, RIGHT STENT PLACEMENT;  Surgeon: Sondra Come, MD;  Location: [facility] ORS;  Service: Urology;  Laterality: Right;   IUD REMOVAL N/A 11/22/2016   Procedure: LAPAROCOPIC INTRAUTERINE DEVICE (IUD) REMOVAL;  Surgeon: Linzie Collin, MD;  Location: [facility] ORS;  Service: Gynecology;  Laterality: N/A;   TYMPANOSTOMY TUBE PLACEMENT     WISDOM TOOTH EXTRACTION       Family History: Family History  Problem Relation Age of Onset   Depression Mother    Diabetes Mother        type 2   Hyperlipidemia Mother    Hypertension Mother    Melanoma Mother        skin   Asthma Mother    Allergic rhinitis Mother    Drug abuse Father    Gout Father    Depression Father    Psychiatric Illness Father        PTSD   Allergic rhinitis Father    Urticaria Father    Allergic rhinitis Sister    ADD / ADHD Brother    Depression Maternal Aunt    Dementia Maternal Grandmother    Depression Maternal Grandmother    Heart disease Maternal Grandfather    Diabetes Paternal Grandmother        type 2   Hypertension Paternal  Grandmother    Crohn's disease Daughter    Stroke Paternal Grandfather     Social History:  reports that she has never smoked. She has never used smokeless tobacco. She reports that she does not drink alcohol and does not use drugs.  Physical Exam: BP 114/83   Pulse 88   Temp 98.2 F (36.8 C) (Oral)   Resp 16   Ht 5\' 2"  (1.575 m)   Wt 69.9 kg   SpO2 99%   BMI 28.17 kg/m    Constitutional:  Alert and oriented, No acute distress. Cardiovascular: No clubbing, cyanosis, or edema. Respiratory: Normal respiratory effort, no increased work of breathing. GI: Abdomen is soft, nontender, nondistended, no abdominal masses  Laboratory Data: Reviewed, recent urine culture 03/17/2021 with low growth of E. coli and Streptococcus, has been on culture appropriate antibiotics   Assessment & Plan:   30 year old female with 1 cm right UPJ stone previously stented for infection, here today for definitive management with ureteroscopy  We specifically discussed the risks ureteroscopy including bleeding, infection/sepsis, stent related symptoms including flank pain/urgency/frequency/incontinence/dysuria, ureteral injury, inability to access stone, or need for staged or additional procedures.  Right ureteroscopy, laser lithotripsy, stent placement today  32, MD 03/20/2021  [facility] [address] [place], [place] [address] [phone]

## 2021-03-20 NOTE — Transfer of Care (Signed)
Immediate Anesthesia Transfer of Care Note  Patient: Katelyn Whitehead  Procedure(s) Performed: CYSTOSCOPY/URETEROSCOPY/HOLMIUM LASER/STENT EXCHANGE (Right)  Patient Location: PACU  Anesthesia Type:General  Level of Consciousness: awake, drowsy and patient cooperative  Airway & Oxygen Therapy: Patient Spontanous Breathing  Post-op Assessment: Report given to RN and Post -op Vital signs reviewed and stable  Post vital signs: Reviewed and stable  Last Vitals:  Vitals Value Taken Time  BP 94/60 03/20/21 0818  Temp    Pulse 89 03/20/21 0821  Resp 14 03/20/21 0821  SpO2 96 % 03/20/21 0821  Vitals shown include unvalidated device data.  Last Pain:  Vitals:   03/20/21 0630  TempSrc: Oral  PainSc: 5          Complications: No notable events documented.

## 2021-03-20 NOTE — Telephone Encounter (Signed)
LMOM for pt to return call to schedule  Please schedule follow-up in 3 months for symptom check

## 2021-03-20 NOTE — Anesthesia Postprocedure Evaluation (Signed)
Anesthesia Post Note  Patient: Katelyn Whitehead  Procedure(s) Performed: CYSTOSCOPY/URETEROSCOPY/HOLMIUM LASER/STENT EXCHANGE (Right)  Patient location during evaluation: PACU Anesthesia Type: General Level of consciousness: awake and alert and oriented Pain management: pain level controlled Vital Signs Assessment: post-procedure vital signs reviewed and stable Respiratory status: spontaneous breathing, nonlabored ventilation and respiratory function stable Cardiovascular status: blood pressure returned to baseline and stable Postop Assessment: no signs of nausea or vomiting Anesthetic complications: no   No notable events documented.   Last Vitals:  Vitals:   03/20/21 0849 03/20/21 0910  BP:  100/66  Pulse: 88 87  Resp: 17 18  Temp: 36.7 C (!) 36.1 C  SpO2: 99% 98%    Last Pain:  Vitals:   03/20/21 0910  TempSrc: Temporal  PainSc: 0-No pain                 Ayza Ripoll

## 2021-03-20 NOTE — Anesthesia Procedure Notes (Signed)
Procedure Name: Intubation Date/Time: 03/20/2021 7:34 AM Performed by: Mohammed Kindle, CRNA Pre-anesthesia Checklist: Patient identified, Emergency Drugs available, Suction available and Patient being monitored Patient Re-evaluated:Patient Re-evaluated prior to induction Oxygen Delivery Method: Circle system utilized Preoxygenation: Pre-oxygenation with 100% oxygen Induction Type: IV induction Ventilation: Mask ventilation without difficulty Laryngoscope Size: McGraph and 3 Grade View: Grade I Tube type: Oral Tube size: 6.5 mm Number of attempts: 1 Airway Equipment and Method: Stylet and Oral airway Placement Confirmation: ETT inserted through vocal cords under direct vision, positive ETCO2, breath sounds checked- equal and bilateral and CO2 detector Secured at: 21 cm Tube secured with: Tape Dental Injury: Teeth and Oropharynx as per pre-operative assessment

## 2021-03-20 NOTE — Discharge Instructions (Signed)
AMBULATORY SURGERY  ?DISCHARGE INSTRUCTIONS ? ? ?The drugs that you were given will stay in your system until tomorrow so for the next 24 hours you should not: ? ?Drive an automobile ?Make any legal decisions ?Drink any alcoholic beverage ? ? ?You may resume regular meals tomorrow.  Today it is better to start with liquids and gradually work up to solid foods. ? ?You may eat anything you prefer, but it is better to start with liquids, then soup and crackers, and gradually work up to solid foods. ? ? ?Please notify your doctor immediately if you have any unusual bleeding, trouble breathing, redness and pain at the surgery site, drainage, fever, or pain not relieved by medication. ? ? ? ?Additional Instructions: ? ? ? ?Please contact your physician with any problems or Same Day Surgery at [phone], Monday through Friday 6 am to 4 pm, or [facility] [place] Main number at [phone].  ?

## 2021-03-20 NOTE — Op Note (Signed)
Date of procedure: 03/20/21  Preoperative diagnosis:  Right renal stone  Postoperative diagnosis:  Same  Procedure: Cystoscopy, right ureteroscopy, laser lithotripsy, right retrograde pyelogram with intraoperative interpretation, right ureteral stent placement  Surgeon: Legrand Rams, MD  Anesthesia: General  Complications: None  Intraoperative findings:  Uncomplicated dusting of right renal stone, stent placed with Dangler  EBL: Minimal  Specimens: None  Drains: Right 6 French by 24 cm ureteral stent with Dangler  Indication: Katelyn Whitehead is a 30 y.o. patient with 1 cm UPJ stone who previously presented with fever and infection and underwent stent placement, and is here today for definitive management with ureteroscopy.  After reviewing the management options for treatment, they elected to proceed with the above surgical procedure(s). We have discussed the potential benefits and risks of the procedure, side effects of the proposed treatment, the likelihood of the patient achieving the goals of the procedure, and any potential problems that might occur during the procedure or recuperation. Informed consent has been obtained.  Description of procedure:  The patient was taken to the operating room and general anesthesia was induced. SCDs were placed for DVT prophylaxis. The patient was placed in the dorsal lithotomy position, prepped and draped in the usual sterile fashion, and preoperative antibiotics were administered. A preoperative time-out was performed.   A 21 French rigid cystoscope was used to intubate the urethra and thorough cystoscopy was performed.  The bladder was grossly normal.  A sensor wire advanced easily alongside the stent up to the kidney under fluoroscopic vision.  The stone could clearly be seen on fluoroscopy.  The stent was then grasped and pulled to the meatus, and a second safety sensor wire was added.  The single channel digital ureteroscope advanced  easily over the wire up to the kidney, and the stone was clearly identified in the renal pelvis.  This was pushed into a midpole calyx to facilitate ease of dusting.  A 200 m laser fiber on settings of 0.3 J and 60 Hz was used to methodically dust the stone to <24mm pieces.  Thorough pyeloscopy revealed no other stone fragments.  Retrograde pyelogram performed from the proximal ureter showed no extravasation or filling defects.  Careful pullback ureteroscopy showed no ureteral injury or residual fragments.  A 6 French by 24 cm ureteral stent was advanced over the wire fluoroscopically with an excellent curl in the renal pelvis, as well as in the bladder.  The bladder was drained, the Dangler was secured to the suprapubic region with a Tegaderm, lidojet was instilled into the bladder, and a belladonna suppository was placed.  Disposition: Stable to PACU  Plan: Amoxicillin prophylaxis with stent in Remove stent at home Tuesday morning Consider shockwave lithotripsy for any future stone episodes, as she did not tolerate the stent well  Legrand Rams, MD

## 2021-03-21 ENCOUNTER — Encounter: Payer: Self-pay | Admitting: Urology

## 2021-03-21 ENCOUNTER — Other Ambulatory Visit: Payer: Self-pay | Admitting: Urology

## 2021-03-21 DIAGNOSIS — N200 Calculus of kidney: Secondary | ICD-10-CM

## 2021-04-01 ENCOUNTER — Other Ambulatory Visit: Payer: Self-pay

## 2021-04-01 MED ORDER — VYVANSE 40 MG PO CAPS
ORAL_CAPSULE | ORAL | 0 refills | Status: DC
  Filled 2021-04-01: qty 30, 30d supply, fill #0

## 2021-04-01 MED ORDER — VYVANSE 40 MG PO CAPS
ORAL_CAPSULE | ORAL | 0 refills | Status: DC
  Filled 2021-06-12: qty 30, 30d supply, fill #0

## 2021-04-01 MED ORDER — SERTRALINE HCL 100 MG PO TABS
ORAL_TABLET | ORAL | 1 refills | Status: DC
  Filled 2021-04-01: qty 135, 90d supply, fill #0

## 2021-04-01 MED ORDER — LORAZEPAM 0.5 MG PO TABS
ORAL_TABLET | ORAL | 1 refills | Status: DC
  Filled 2021-04-01: qty 45, 30d supply, fill #0
  Filled 2021-06-12: qty 45, 30d supply, fill #1

## 2021-04-01 MED ORDER — VYVANSE 40 MG PO CAPS: ORAL_CAPSULE | ORAL | 0 refills | Status: DC

## 2021-04-07 ENCOUNTER — Encounter: Payer: No Typology Code available for payment source | Admitting: Nurse Practitioner

## 2021-04-10 ENCOUNTER — Other Ambulatory Visit: Payer: Self-pay

## 2021-04-10 MED ORDER — ACETAMINOPHEN-CODEINE 300-30 MG PO TABS
ORAL_TABLET | ORAL | 4 refills | Status: DC
  Filled 2021-04-10: qty 16, 4d supply, fill #0

## 2021-04-10 MED ORDER — AMOXICILLIN 875 MG PO TABS
ORAL_TABLET | ORAL | 0 refills | Status: AC
  Filled 2021-04-10: qty 11, 5d supply, fill #0

## 2021-04-10 MED FILL — Albuterol Sulfate Inhal Aero 108 MCG/ACT (90MCG Base Equiv): RESPIRATORY_TRACT | 25 days supply | Qty: 18 | Fill #2 | Status: AC

## 2021-05-15 ENCOUNTER — Other Ambulatory Visit: Payer: Self-pay

## 2021-05-22 ENCOUNTER — Other Ambulatory Visit: Payer: Self-pay | Admitting: Nurse Practitioner

## 2021-05-22 ENCOUNTER — Other Ambulatory Visit: Payer: Self-pay

## 2021-05-23 MED FILL — Albuterol Sulfate Inhal Aero 108 MCG/ACT (90MCG Base Equiv): RESPIRATORY_TRACT | 25 days supply | Qty: 18 | Fill #0 | Status: AC

## 2021-05-23 NOTE — Telephone Encounter (Signed)
Requested Prescriptions  Pending Prescriptions Disp Refills  . albuterol (VENTOLIN HFA) 108 (90 Base) MCG/ACT inhaler 18 g 2    Sig: TAKE 2 PUFFS BY MOUTH EVERY 6 HOURS AS NEEDED FOR WHEEZE OR SHORTNESS OF BREATH     Pulmonology:  Beta Agonists Failed - 05/22/2021  1:42 PM      Failed - One inhaler should last at least one month. If the patient is requesting refills earlier, contact the patient to check for uncontrolled symptoms.      Passed - Valid encounter within last 12 months    Recent Outpatient Visits          2 months ago Persistent asthma without complication, unspecified asthma severity   [facility] McElwee, Lauren A, NP   5 months ago Frequent UTI   [facility] McElwee, Jake Church, NP   8 months ago COVID-19   [facility] Caro Laroche, DO   9 months ago Rash   [facility] [place], Corrie Dandy T, NP   1 year ago Palpitations   [facility] Valentino Nose, NP      Future Appointments            In 2 weeks McElwee, Jake Church, NP [facility]   In 3 weeks Richardo Hanks, Laurette Schimke, MD [facility]

## 2021-05-25 ENCOUNTER — Other Ambulatory Visit: Payer: Self-pay

## 2021-06-04 ENCOUNTER — Other Ambulatory Visit: Payer: Self-pay

## 2021-06-05 ENCOUNTER — Other Ambulatory Visit: Payer: Self-pay

## 2021-06-08 ENCOUNTER — Ambulatory Visit (INDEPENDENT_AMBULATORY_CARE_PROVIDER_SITE_OTHER): Payer: No Typology Code available for payment source | Admitting: Nurse Practitioner

## 2021-06-08 ENCOUNTER — Encounter: Payer: Self-pay | Admitting: Nurse Practitioner

## 2021-06-08 ENCOUNTER — Other Ambulatory Visit: Payer: Self-pay

## 2021-06-08 VITALS — BP 93/61 | HR 92 | Temp 98.7°F | Ht 61.8 in | Wt 161.8 lb

## 2021-06-08 DIAGNOSIS — F331 Major depressive disorder, recurrent, moderate: Secondary | ICD-10-CM

## 2021-06-08 DIAGNOSIS — R5383 Other fatigue: Secondary | ICD-10-CM

## 2021-06-08 DIAGNOSIS — F410 Panic disorder [episodic paroxysmal anxiety] without agoraphobia: Secondary | ICD-10-CM

## 2021-06-08 DIAGNOSIS — Z13220 Encounter for screening for lipoid disorders: Secondary | ICD-10-CM | POA: Diagnosis not present

## 2021-06-08 DIAGNOSIS — Z136 Encounter for screening for cardiovascular disorders: Secondary | ICD-10-CM

## 2021-06-08 DIAGNOSIS — Z0000 Encounter for general adult medical examination without abnormal findings: Secondary | ICD-10-CM

## 2021-06-08 DIAGNOSIS — G2581 Restless legs syndrome: Secondary | ICD-10-CM

## 2021-06-08 NOTE — Assessment & Plan Note (Signed)
Chronic, stable. Follows with psychiatry and is currently taking zoloft and ativan prn. Continue collaboration and recommendations from psychiatry.

## 2021-06-08 NOTE — Progress Notes (Signed)
BP 93/61   Pulse 92   Temp 98.7 F (37.1 C) (Oral)   Ht 5' 1.8" (1.57 m)   Wt 161 lb 12.8 oz (73.4 kg)   SpO2 98%   BMI 29.79 kg/m    Subjective:    Patient ID: Katelyn Whitehead, female    DOB: 10/10/1990, 30 y.o.   MRN: [id number]  CC: Chief Complaint  Patient presents with   Annual Exam   HPI: Katelyn Whitehead is a 30 y.o. female presenting on 06/08/2021 for comprehensive medical examination. Current medical complaints include:none  She currently lives with: husband and daughter Menopausal Symptoms: no  Depression Screen done today and results listed below:  Depression screen [facility] 2/9 06/08/2021 08/20/2020 04/01/2020 09/10/2019  Decreased Interest 0 0 1 1  Down, Depressed, Hopeless 0 1 1 1   PHQ - 2 Score 0 1 2 2   Altered sleeping 3 1 2 2   Tired, decreased energy 3 1 2 2   Change in appetite 2 0 1 2  Feeling bad or failure about yourself  1 0 1 2  Trouble concentrating 3 3 3 2   Moving slowly or fidgety/restless 0 0 1 1  Suicidal thoughts 0 0 0 0  PHQ-9 Score 12 6 12 13   Difficult doing work/chores Somewhat difficult - Not difficult at all Somewhat difficult  Some encounter information is confidential and restricted. Go to Review Flowsheets activity to see all data.  Some recent data might be hidden   GAD 7 : Generalized Anxiety Score 06/08/2021 04/01/2020 09/10/2019  Nervous, Anxious, on Edge 1 3 3   Control/stop worrying 2 2 3   Worry too much - different things 2 3 3   Trouble relaxing 2 2 2   Restless 1 1 3   Easily annoyed or irritable 1 2 2   Afraid - awful might happen 3 2 3   Total GAD 7 Score 12 15 19   Anxiety Difficulty Somewhat difficult - Somewhat difficult  Some encounter information is confidential and restricted. Go to Review Flowsheets activity to see all data.     The patient does not have a history of falls. I did not complete a risk assessment for falls. A plan of care for falls was not documented.   Past Medical History:  Past Medical History:   Diagnosis Date   ADHD (attention deficit hyperactivity disorder)    Anxiety    Asthma    childhood   COVID-19 virus infection 08/2020   Depression    Mild mitral and aortic regurgitation    a. 04/2013 Echo: EF 50-55%, mild AI, mild MR, no MVP.   Mitral valve prolapse    Palpitations    a. 2012 Holter: sinus tach, pvc's.   Panic disorder    Urticaria     Surgical History:  Past Surgical History:  Procedure Laterality Date   CYSTOSCOPY/URETEROSCOPY/HOLMIUM LASER/STENT PLACEMENT Right 02/26/2021   Procedure: CYSTOSCOPY/URETEROSCOPY, RIGHT STENT PLACEMENT;  Surgeon: 04/03/2020, MD;  Location: [facility];  Service: Urology;  Laterality: Right;   CYSTOSCOPY/URETEROSCOPY/HOLMIUM LASER/STENT PLACEMENT Right 03/20/2021   Procedure: CYSTOSCOPY/URETEROSCOPY/HOLMIUM LASER/STENT EXCHANGE;  Surgeon: , MD;  Location: [facility];  Service: Urology;  Laterality: Right;   IUD REMOVAL N/A 11/22/2016   Procedure: LAPAROCOPIC INTRAUTERINE DEVICE (IUD) REMOVAL;  Surgeon: , MD;  Location: [facility];  Service: Gynecology;  Laterality: N/A;   TYMPANOSTOMY TUBE PLACEMENT     WISDOM TOOTH EXTRACTION      Medications:  Current Outpatient Medications on File Prior to Visit  Medication Sig   acetaminophen (TYLENOL) 500 MG tablet Take 500 mg by mouth every 6 (six) hours as needed.   albuterol (VENTOLIN HFA) 108 (90 Base) MCG/ACT inhaler TAKE 2 PUFFS BY MOUTH EVERY 6 HOURS AS NEEDED FOR WHEEZE OR SHORTNESS OF BREATH   diphenhydrAMINE HCl (BENADRYL ALLERGY PO) Take by mouth as needed.    Drospirenone (SLYND) 4 MG TABS Take 4 mg by mouth daily.   ibuprofen (ADVIL) 800 MG tablet Take 1 tablet (800 mg total) by mouth every 8 (eight) hours as needed.   lisdexamfetamine (VYVANSE) 40 MG capsule Take 1 capsule (40 mg total) by mouth every morning.   lisdexamfetamine (VYVANSE) 40 MG capsule Take 1 capsule (40 mg total) by mouth every morning.   lisdexamfetamine (VYVANSE) 40 MG capsule  Take 1 capsule (40 mg total) by mouth every morning.   lisdexamfetamine (VYVANSE) 40 MG capsule Take 1 capsule (40 mg total) by mouth every morning.   LORazepam (ATIVAN) 0.5 MG tablet Take 1/2 in morning and 1/2 to 1 tab in evening as needed for high anxiety   sertraline (ZOLOFT) 100 MG tablet Take 1 and a half tablets by mouth daily   No current facility-administered medications on file prior to visit.    Allergies:  Allergies  Allergen Reactions   Influenza Vaccines Hives   Sulfa Antibiotics     Hives Rash    Social History:  Social History   Socioeconomic History   Marital status: Married    Spouse name: Not on file   Number of children: Not on file   Years of education: Not on file   Highest education level: Not on file  Occupational History   Not on file  Tobacco Use   Smoking status: Never   Smokeless tobacco: Never  Vaping Use   Vaping Use: Never used  Substance and Sexual Activity   Alcohol use: No    Alcohol/week: 0.0 standard drinks   Drug use: No   Sexual activity: Yes    Birth control/protection: Pill  Other Topics Concern   Not on file  Social History Narrative   Not on file   Social Determinants of Health   Financial Resource Strain: Not on file  Food Insecurity: Not on file  Transportation Needs: Not on file  Physical Activity: Not on file  Stress: Not on file  Social Connections: Not on file  Intimate Partner Violence: Not on file   Social History   Tobacco Use  Smoking Status Never  Smokeless Tobacco Never   Social History   Substance and Sexual Activity  Alcohol Use No   Alcohol/week: 0.0 standard drinks    Family History:  Family History  Problem Relation Age of Onset   Depression Mother    Diabetes Mother        type 2   Hyperlipidemia Mother    Hypertension Mother    Melanoma Mother        skin   Asthma Mother    Allergic rhinitis Mother    Drug abuse Father    Gout Father    Depression Father    Psychiatric  Illness Father        PTSD   Allergic rhinitis Father    Urticaria Father    Allergic rhinitis Sister    ADD / ADHD Brother    Crohn's disease Daughter    Depression Maternal Aunt    Dementia Maternal Grandmother    Depression Maternal Grandmother    Heart disease Maternal  Grandfather    Diabetes Paternal Grandmother        type 2   Hypertension Paternal Grandmother    Stroke Paternal Grandfather     Past medical history, surgical history, medications, allergies, family history and social history reviewed with patient today and changes made to appropriate areas of the chart.   Review of Systems  Constitutional:  Positive for malaise/fatigue. Negative for fever.  HENT: Negative.    Eyes: Negative.   Respiratory: Negative.    Cardiovascular: Negative.   Gastrointestinal:  Positive for heartburn. Negative for constipation, diarrhea, nausea and vomiting.  Genitourinary: Negative.   Musculoskeletal:  Negative for falls, joint pain and myalgias.       Restless leg at night. She had this after delivering her daughter and now the symptoms have returned  Neurological:  Positive for headaches (with menstrual cycle). Negative for dizziness.  Psychiatric/Behavioral:  The patient is nervous/anxious.   All other ROS negative except what is listed above and in the HPI.      Objective:    BP 93/61   Pulse 92   Temp 98.7 F (37.1 C) (Oral)   Ht 5' 1.8" (1.57 m)   Wt 161 lb 12.8 oz (73.4 kg)   SpO2 98%   BMI 29.79 kg/m   Wt Readings from Last 3 Encounters:  06/08/21 161 lb 12.8 oz (73.4 kg)  03/20/21 154 lb (69.9 kg)  02/26/21 154 lb 15.7 oz (70.3 kg)    Physical Exam Vitals and nursing note reviewed.  Constitutional:      General: She is not in acute distress.    Appearance: Normal appearance.  HENT:     Head: Normocephalic and atraumatic.     Right Ear: Tympanic membrane, ear canal and external ear normal.     Left Ear: Tympanic membrane, ear canal and external ear normal.      Nose: Nose normal.     Mouth/Throat:     Mouth: Mucous membranes are moist.     Pharynx: Oropharynx is clear.  Eyes:     Conjunctiva/sclera: Conjunctivae normal.  Cardiovascular:     Rate and Rhythm: Normal rate and regular rhythm.     Pulses: Normal pulses.     Heart sounds: Normal heart sounds.  Pulmonary:     Effort: Pulmonary effort is normal.     Breath sounds: Normal breath sounds.  Abdominal:     General: Bowel sounds are normal.     Palpations: Abdomen is soft.     Tenderness: There is no abdominal tenderness.  Musculoskeletal:        General: Normal range of motion.     Cervical back: Normal range of motion.     Comments: Strength 5/5 in bilateral upper and lower extremities   Skin:    General: Skin is warm and dry.  Neurological:     General: No focal deficit present.     Mental Status: She is alert and oriented to person, place, and time.     Cranial Nerves: No cranial nerve deficit.     Coordination: Coordination normal.     Gait: Gait normal.  Psychiatric:        Mood and Affect: Mood normal.        Behavior: Behavior normal.        Thought Content: Thought content normal.        Judgment: Judgment normal.    Results for orders placed or performed during the hospital encounter of 03/20/21  Pregnancy, urine  POC  Result Value Ref Range   Preg Test, Ur NEGATIVE NEGATIVE      Assessment & Plan:   Problem List Items Addressed This Visit       Other   Depression, major, recurrent, moderate (HCC)    Chronic, stable. Follows with psychiatry and is currently taking zoloft and ativan prn. Continue collaboration and recommendations from psychiatry.       Panic disorder    Chronic, stable. Follows with psychiatry and is currently taking zoloft and ativan prn. Continue collaboration and recommendations from psychiatry.       Other Visit Diagnoses     Fatigue, unspecified type    -  Primary   Chronic, ongoing. Will check CMP, CBC, TSH, A1C, and vitamin  D today. Consider sleep study in future if labs unrevealing   Relevant Orders   Bayer DCA Hb A1c Waived   CBC with Differential/Platelet   Comprehensive metabolic panel   TSH   Vitamin D (25 hydroxy)   Restless leg       Occurs intermittently at night, worse recently. Will check CBC and vitamin B12.    Relevant Orders   Vitamin B12   Routine general medical examination at a health care facility       Health maintenance reviewed and updated. Check CMP, CBC, and TSH today.    Relevant Orders   CBC with Differential/Platelet   Comprehensive metabolic panel   TSH   Encounter for lipid screening for cardiovascular disease       Check lipid panel today   Relevant Orders   Lipid Panel w/o Chol/HDL Ratio        Follow up plan: Return in about 1 year (around 06/08/2022) for physical .   LABORATORY TESTING:  - Pap smear: up to date  IMMUNIZATIONS:   - Tdap: Tetanus vaccination status reviewed: last tetanus booster within 10 years. - Influenza:  Allergic to flu vaccine - Pneumovax: Not applicable - Prevnar: Not applicable - HPV: Not applicable - Zostavax vaccine: Not applicable  SCREENING: -Mammogram: Not applicable  - Colonoscopy: Not applicable  - Bone Density: Not applicable  -Hearing Test: Not applicable  -Spirometry: Not applicable   PATIENT COUNSELING:   Advised to take 1 mg of folate supplement per day if capable of pregnancy.   Sexuality: Discussed sexually transmitted diseases, partner selection, use of condoms, avoidance of unintended pregnancy  and contraceptive alternatives.   Advised to avoid cigarette smoking.  I discussed with the patient that most people either abstain from alcohol or drink within safe limits (<=14/week and <=4 drinks/occasion for males, <=7/weeks and <= 3 drinks/occasion for females) and that the risk for alcohol disorders and other health effects rises proportionally with the number of drinks per week and how often a drinker exceeds daily  limits.  Discussed cessation/primary prevention of drug use and availability of treatment for abuse.   Diet: Encouraged to adjust caloric intake to maintain  or achieve ideal body weight, to reduce intake of dietary saturated fat and total fat, to limit sodium intake by avoiding high sodium foods and not adding table salt, and to maintain adequate dietary potassium and calcium preferably from fresh fruits, vegetables, and low-fat dairy products.    stressed the importance of regular exercise  Injury prevention: Discussed safety belts, safety helmets, smoke detector, smoking near bedding or upholstery.   Dental health: Discussed importance of regular tooth brushing, flossing, and dental visits.    NEXT PREVENTATIVE PHYSICAL DUE IN 1 YEAR. Return in  about 1 year (around 06/08/2022) for physical .

## 2021-06-09 LAB — VITAMIN D 25 HYDROXY (VIT D DEFICIENCY, FRACTURES): Vit D, 25-Hydroxy: 19 ng/mL — ABNORMAL LOW (ref 30.0–100.0)

## 2021-06-09 LAB — COMPREHENSIVE METABOLIC PANEL
ALT: 18 IU/L (ref 0–32)
AST: 21 IU/L (ref 0–40)
Albumin/Globulin Ratio: 1.5 (ref 1.2–2.2)
Albumin: 4.4 g/dL (ref 3.8–4.8)
Alkaline Phosphatase: 102 IU/L (ref 44–121)
BUN/Creatinine Ratio: 13 (ref 9–23)
BUN: 11 mg/dL (ref 6–20)
Bilirubin Total: 0.2 mg/dL (ref 0.0–1.2)
CO2: 25 mmol/L (ref 20–29)
Calcium: 9.6 mg/dL (ref 8.7–10.2)
Chloride: 102 mmol/L (ref 96–106)
Creatinine, Ser: 0.82 mg/dL (ref 0.57–1.00)
Globulin, Total: 2.9 g/dL (ref 1.5–4.5)
Glucose: 95 mg/dL (ref 70–99)
Potassium: 3.7 mmol/L (ref 3.5–5.2)
Sodium: 138 mmol/L (ref 134–144)
Total Protein: 7.3 g/dL (ref 6.0–8.5)
eGFR: 97 mL/min/{1.73_m2} (ref >59)

## 2021-06-09 LAB — CBC WITH DIFFERENTIAL/PLATELET
Basophils Absolute: 0 10*3/uL (ref 0.0–0.2)
Basos: 1 %
EOS (ABSOLUTE): 0.6 10*3/uL — ABNORMAL HIGH (ref 0.0–0.4)
Eos: 8 %
Hematocrit: 42 % (ref 34.0–46.6)
Hemoglobin: 14 g/dL (ref 11.1–15.9)
Immature Grans (Abs): 0 10*3/uL (ref 0.0–0.1)
Immature Granulocytes: 0 %
Lymphocytes Absolute: 1.6 10*3/uL (ref 0.7–3.1)
Lymphs: 23 %
MCH: 27.4 pg (ref 26.6–33.0)
MCHC: 33.3 g/dL (ref 31.5–35.7)
MCV: 82 fL (ref 79–97)
Monocytes Absolute: 0.5 10*3/uL (ref 0.1–0.9)
Monocytes: 7 %
Neutrophils Absolute: 4.2 10*3/uL (ref 1.4–7.0)
Neutrophils: 61 %
Platelets: 358 10*3/uL (ref 150–450)
RBC: 5.11 x10E6/uL (ref 3.77–5.28)
RDW: 13.1 % (ref 11.7–15.4)
WBC: 6.9 10*3/uL (ref 3.4–10.8)

## 2021-06-09 LAB — LIPID PANEL W/O CHOL/HDL RATIO
Cholesterol, Total: 188 mg/dL (ref 100–199)
HDL: 49 mg/dL (ref >39)
LDL Chol Calc (NIH): 127 mg/dL — ABNORMAL HIGH (ref 0–99)
Triglycerides: 67 mg/dL (ref 0–149)
VLDL Cholesterol Cal: 12 mg/dL (ref 5–40)

## 2021-06-09 LAB — VITAMIN B12: Vitamin B-12: 372 pg/mL (ref 232–1245)

## 2021-06-09 LAB — BAYER DCA HB A1C WAIVED: HB A1C (BAYER DCA - WAIVED): 5.2 % (ref 4.8–5.6)

## 2021-06-09 LAB — TSH: TSH: 1.15 u[IU]/mL (ref 0.450–4.500)

## 2021-06-12 ENCOUNTER — Other Ambulatory Visit: Payer: Self-pay

## 2021-06-15 ENCOUNTER — Ambulatory Visit
Admission: RE | Admit: 2021-06-15 | Discharge: 2021-06-15 | Disposition: A | Discharge status: Home / Self Care | Payer: No Typology Code available for payment source | Source: Ambulatory Visit | Attending: Nurse Practitioner | Admitting: Nurse Practitioner

## 2021-06-15 ENCOUNTER — Other Ambulatory Visit: Payer: Self-pay

## 2021-06-15 DIAGNOSIS — R1011 Right upper quadrant pain: Secondary | ICD-10-CM

## 2021-06-16 ENCOUNTER — Other Ambulatory Visit: Payer: Self-pay

## 2021-06-18 ENCOUNTER — Ambulatory Visit
Admission: RE | Admit: 2021-06-18 | Discharge: 2021-06-18 | Disposition: A | Discharge status: Home / Self Care | Payer: No Typology Code available for payment source | Attending: Urology | Admitting: Urology

## 2021-06-18 ENCOUNTER — Telehealth: Payer: Self-pay

## 2021-06-18 ENCOUNTER — Ambulatory Visit (INDEPENDENT_AMBULATORY_CARE_PROVIDER_SITE_OTHER): Payer: No Typology Code available for payment source | Admitting: Urology

## 2021-06-18 ENCOUNTER — Other Ambulatory Visit: Payer: Self-pay

## 2021-06-18 ENCOUNTER — Encounter: Payer: Self-pay | Admitting: Urology

## 2021-06-18 ENCOUNTER — Ambulatory Visit
Admission: RE | Admit: 2021-06-18 | Discharge: 2021-06-18 | Disposition: A | Discharge status: Home / Self Care | Payer: No Typology Code available for payment source | Source: Ambulatory Visit | Attending: Urology | Admitting: Urology

## 2021-06-18 VITALS — BP 118/77 | HR 102 | Ht 61.8 in | Wt 157.0 lb

## 2021-06-18 DIAGNOSIS — Z87442 Personal history of urinary calculi: Secondary | ICD-10-CM

## 2021-06-18 DIAGNOSIS — N200 Calculus of kidney: Secondary | ICD-10-CM | POA: Diagnosis present

## 2021-06-18 DIAGNOSIS — N390 Urinary tract infection, site not specified: Secondary | ICD-10-CM | POA: Diagnosis not present

## 2021-06-18 NOTE — Telephone Encounter (Signed)
-----   Message from Sondra Come, MD sent at 06/18/2021  5:01 PM EST ----- Regarding: UA results UA contaminated with skin cells, no infection. If no improvement in right sided pain in the next week we could consider CT, but doesn't sound like kidney stone based on her symptoms, and KUB looking normal  Legrand Rams, MD 06/18/2021

## 2021-06-18 NOTE — Progress Notes (Signed)
   06/18/2021 3:06 PM   Katelyn Whitehead Oh May 26, 1991 [id number]  Reason for visit: Follow up nephrolithiasis, recurrent UTIs  HPI: 30 year old female with recurrent UTIs who was found to have a 1 cm right UPJ stone and July 2022 and 1 urgent stent placement at that time followed by definitive right ureteroscopy, laser lithotripsy, and stent placement on 03/20/2021.    She had been doing very well until about a week ago when she started having some soreness on her right upper flank.  A right upper quadrant ultrasound was ordered by PCP and was benign for any gallbladder pathology.  She denies any urinary symptoms.  I personally reviewed and interpreted her KUB today that shows no right-sided stones, and possible small left upper pole stones.  Urinalysis today is pending.  We discussed prevention strategies for UTIs including cranberry tablets, avoiding tub baths, and timed voiding. We discussed general stone prevention strategies including adequate hydration with goal of producing 2.5 L of urine daily, increasing citric acid intake, increasing calcium intake during high oxalate meals, minimizing animal protein, and decreasing salt intake. Information about dietary recommendations given today.   Call with urinalysis results, if microscopic hematuria consider CT If urinalysis benign, continue yearly follow-up with KUB for history of stones and recurrent infections   Sondra Come, MD  [facility] [address] [place], [place] [address] [phone]

## 2021-06-18 NOTE — Patient Instructions (Signed)
Dietary Guidelines to Help Prevent Kidney Stones Kidney stones are deposits of minerals and salts that form inside your kidneys. Your risk of developing kidney stones may be greater depending on your diet, your lifestyle, the medicines you take, and whether you have certain medical conditions. Most people can lower their chances of developing kidney stones by following the instructions below. Your dietitian may give you more specific instructions depending on your overall health and the type of kidney stones you tend to develop. What are tips for following this plan? Reading food labels  Choose foods with "no salt added" or "low-salt" labels. Limit your salt (sodium) intake to less than 1,500 mg a day. Choose foods with calcium for each meal and snack. Try to eat about 300 mg of calcium at each meal. Foods that contain 200-500 mg of calcium a serving include: 8 oz (237 mL) of milk, calcium-fortifiednon-dairy milk, and calcium-fortifiedfruit juice. Calcium-fortified means that calcium has been added to these drinks. 8 oz (237 mL) of kefir, yogurt, and soy yogurt. 4 oz (114 g) of tofu. 1 oz (28 g) of cheese. 1 cup (150 g) of dried figs. 1 cup (91 g) of cooked broccoli. One 3 oz (85 g) can of sardines or mackerel. Most people need 1,000-1,500 mg of calcium a day. Talk to your dietitian about how much calcium is recommended for you. Shopping Buy plenty of fresh fruits and vegetables. Most people do not need to avoid fruits and vegetables, even if these foods contain nutrients that may contribute to kidney stones. When shopping for convenience foods, choose: Whole pieces of fruit. Pre-made salads with dressing on the side. Low-fat fruit and yogurt smoothies. Avoid buying frozen meals or prepared deli foods. These can be high in sodium. Look for foods with live cultures, such as yogurt and kefir. Choose high-fiber grains, such as whole-wheat breads, oat bran, and wheat cereals. Cooking Do not add  salt to food when cooking. Place a salt shaker on the table and allow each person to add his or her own salt to taste. Use vegetable protein, such as beans, textured vegetable protein (TVP), or tofu, instead of meat in pasta, casseroles, and soups. Meal planning Eat less salt, if told by your dietitian. To do this: Avoid eating processed or pre-made food. Avoid eating fast food. Eat less animal protein, including cheese, meat, poultry, or fish, if told by your dietitian. To do this: Limit the number of times you have meat, poultry, fish, or cheese each week. Eat a diet free of meat at least 2 days a week. Eat only one serving each day of meat, poultry, fish, or seafood. When you prepare animal protein, cut pieces into small portion sizes. For most meat and fish, one serving is about the size of the palm of your hand. Eat at least five servings of fresh fruits and vegetables each day. To do this: Keep fruits and vegetables on hand for snacks. Eat one piece of fruit or a handful of berries with breakfast. Have a salad and fruit at lunch. Have two kinds of vegetables at dinner. Limit foods that are high in a substance called oxalate. These include: Spinach (cooked), rhubarb, beets, sweet potatoes, and Swiss chard. Peanuts. Potato chips, french fries, and baked potatoes with skin on. Nuts and nut products. Chocolate. If you regularly take a diuretic medicine, make sure to eat at least 1 or 2 servings of fruits or vegetables that are high in potassium each day. These include: Avocado. Banana. Orange, prune,   carrot, or tomato juice. Baked potato. Cabbage. Beans and split peas. Lifestyle  Drink enough fluid to keep your urine pale yellow. This is the most important thing you can do. Spread your fluid intake throughout the day. If you drink alcohol: Limit how much you use to: 0-1 drink a day for women who are not pregnant. 0-2 drinks a day for men. Be aware of how much alcohol is in your  drink. In the [place] one drink equals one 12 oz bottle of beer (355 mL), one 5 oz glass of wine (148 mL), or one 1 oz glass of hard liquor (44 mL). Lose weight if told by your health care provider. Work with your dietitian to find an eating plan and weight loss strategies that work best for you. General information Talk to your health care provider and dietitian about taking daily supplements. You may be told the following depending on your health and the cause of your kidney stones: Not to take supplements with vitamin C. To take a calcium supplement. To take a daily probiotic supplement. To take other supplements such as magnesium, fish oil, or vitamin B6. Take over-the-counter and prescription medicines only as told by your health care provider. These include supplements. What foods should I limit? Limit your intake of the following foods, or eat them as told by your dietitian. Vegetables Spinach. Rhubarb. Beets. Canned vegetables. Pickles. Olives. Baked potatoes with skin. Grains Wheat bran. Baked goods. Salted crackers. Cereals high in sugar. Meats and other proteins Nuts. Nut butters. Large portions of meat, poultry, or fish. Salted, precooked, or cured meats, such as sausages, meat loaves, and hot dogs. Dairy Cheese. Beverages Regular soft drinks. Regular vegetable juice. Seasonings and condiments Seasoning blends with salt. Salad dressings. Soy sauce. Ketchup. Barbecue sauce. Other foods Canned soups. Canned pasta sauce. Casseroles. Pizza. Lasagna. Frozen meals. Potato chips. French fries. The items listed above may not be a complete list of foods and beverages you should limit. Contact a dietitian for more information. What foods should I avoid? Talk to your dietitian about specific foods you should avoid based on the type of kidney stones you have and your overall health. Fruits Grapefruit. The item listed above may not be a complete list of foods and beverages you should  avoid. Contact a dietitian for more information. Summary Kidney stones are deposits of minerals and salts that form inside your kidneys. You can lower your risk of kidney stones by making changes to your diet. The most important thing you can do is drink enough fluid. Drink enough fluid to keep your urine pale yellow. Talk to your dietitian about how much calcium you should have each day, and eat less salt and animal protein as told by your dietitian. This information is not intended to replace advice given to you by your health care provider. Make sure you discuss any questions you have with your health care provider. Document Revised: 07/19/2019 Document Reviewed: 07/19/2019 Elsevier Patient Education  2022 [employer]  

## 2021-06-18 NOTE — Addendum Note (Signed)
Addended by: Frankey Shown on: 06/18/2021 03:58 PM   Modules accepted: Orders

## 2021-06-19 LAB — MICROSCOPIC EXAMINATION
Bacteria, UA: NONE SEEN
Epithelial Cells (non renal): >10 /hpf — AB (ref 0–10)

## 2021-06-19 LAB — URINALYSIS, COMPLETE
Bilirubin, UA: NEGATIVE
Glucose, UA: NEGATIVE
Ketones, UA: NEGATIVE
Leukocytes,UA: NEGATIVE
Nitrite, UA: NEGATIVE
Protein,UA: NEGATIVE
Specific Gravity, UA: >1.03 — ABNORMAL HIGH (ref 1.005–1.030)
Urobilinogen, Ur: 0.2 mg/dL (ref 0.2–1.0)
pH, UA: 6 (ref 5.0–7.5)

## 2021-06-19 NOTE — Progress Notes (Deleted)
Acute Office Visit  Subjective:    Patient ID: Katelyn Whitehead, female    DOB: February 19, 1991, 30 y.o.   MRN: [id number]  No chief complaint on file.   HPI Patient is in today for back pain and hematuria. She recently went to her urologist and had a u/a which showed 2+ blood and no bacteria. KUB at urologist on 06/18/21 showed no kidney stones in the right kidney and possible small left upper kidney stones. Urology recommends a CT if u/a showed hematuria.   Past Medical History:  Diagnosis Date   ADHD (attention deficit hyperactivity disorder)    Anxiety    Asthma    childhood   COVID-19 virus infection 08/2020   Depression    Kidney stone    Mild mitral and aortic regurgitation    a. 04/2013 Echo: EF 50-55%, mild AI, mild MR, no MVP.   Mitral valve prolapse    Palpitations    a. 2012 Holter: sinus tach, pvc's.   Panic disorder    Urticaria     Past Surgical History:  Procedure Laterality Date   CYSTOSCOPY/URETEROSCOPY/HOLMIUM LASER/STENT PLACEMENT Right 02/26/2021   Procedure: CYSTOSCOPY/URETEROSCOPY, RIGHT STENT PLACEMENT;  Surgeon: Billey Co, MD;  Location: [facility];  Service: Urology;  Laterality: Right;   CYSTOSCOPY/URETEROSCOPY/HOLMIUM LASER/STENT PLACEMENT Right 03/20/2021   Procedure: CYSTOSCOPY/URETEROSCOPY/HOLMIUM LASER/STENT EXCHANGE;  Surgeon: Billey Co, MD;  Location: [facility];  Service: Urology;  Laterality: Right;   IUD REMOVAL N/A 11/22/2016   Procedure: LAPAROCOPIC INTRAUTERINE DEVICE (IUD) REMOVAL;  Surgeon: Harlin Heys, MD;  Location: [facility];  Service: Gynecology;  Laterality: N/A;   TYMPANOSTOMY TUBE PLACEMENT     WISDOM TOOTH EXTRACTION      Family History  Problem Relation Age of Onset   Depression Mother    Diabetes Mother        type 2   Hyperlipidemia Mother    Hypertension Mother    Melanoma Mother        skin   Asthma Mother    Allergic rhinitis Mother    Drug abuse Father    Gout Father    Depression Father     Psychiatric Illness Father        PTSD   Allergic rhinitis Father    Urticaria Father    Allergic rhinitis Sister    ADD / ADHD Brother    Crohn's disease Daughter    Depression Maternal Aunt    Dementia Maternal Grandmother    Depression Maternal Grandmother    Heart disease Maternal Grandfather    Diabetes Paternal Grandmother        type 2   Hypertension Paternal Grandmother    Stroke Paternal Grandfather     Social History   Socioeconomic History   Marital status: Married    Spouse name: Not on file   Number of children: Not on file   Years of education: Not on file   Highest education level: Not on file  Occupational History   Not on file  Tobacco Use   Smoking status: Never   Smokeless tobacco: Never  Vaping Use   Vaping Use: Never used  Substance and Sexual Activity   Alcohol use: No    Alcohol/week: 0.0 standard drinks   Drug use: No   Sexual activity: Yes    Birth control/protection: Pill  Other Topics Concern   Not on file  Social History Narrative   Not on file   Social Determinants of Health  Financial Resource Strain: Not on file  Food Insecurity: Not on file  Transportation Needs: Not on file  Physical Activity: Not on file  Stress: Not on file  Social Connections: Not on file  Intimate Partner Violence: Not on file    Outpatient Medications Prior to Visit  Medication Sig Dispense Refill   acetaminophen (TYLENOL) 500 MG tablet Take 500 mg by mouth every 6 (six) hours as needed.     albuterol (VENTOLIN HFA) 108 (90 Base) MCG/ACT inhaler TAKE 2 PUFFS BY MOUTH EVERY 6 HOURS AS NEEDED FOR WHEEZE OR SHORTNESS OF BREATH 18 g 2   diphenhydrAMINE HCl (BENADRYL ALLERGY PO) Take by mouth as needed.      Drospirenone (SLYND) 4 MG TABS Take 4 mg by mouth daily. 28 tablet 11   ibuprofen (ADVIL) 800 MG tablet Take 1 tablet (800 mg total) by mouth every 8 (eight) hours as needed. 20 tablet 0   lisdexamfetamine (VYVANSE) 40 MG capsule Take 1 capsule (40 mg  total) by mouth every morning. 30 capsule 0   lisdexamfetamine (VYVANSE) 40 MG capsule Take 1 capsule (40 mg total) by mouth every morning. 30 capsule 0   lisdexamfetamine (VYVANSE) 40 MG capsule Take 1 capsule (40 mg total) by mouth every morning. 30 capsule 0   lisdexamfetamine (VYVANSE) 40 MG capsule Take 1 capsule (40 mg total) by mouth every morning. 30 capsule 0   LORazepam (ATIVAN) 0.5 MG tablet Take 1/2 in morning and 1/2 to 1 tab in evening as needed for high anxiety 45 tablet 1   sertraline (ZOLOFT) 100 MG tablet Take 1 and a half tablets by mouth daily 135 tablet 1   No facility-administered medications prior to visit.    Allergies  Allergen Reactions   Influenza Vaccines Hives   Sulfa Antibiotics     Hives Rash    Review of Systems     Objective:    Physical Exam  There were no vitals taken for this visit. Wt Readings from Last 3 Encounters:  06/18/21 157 lb (71.2 kg)  06/08/21 161 lb 12.8 oz (73.4 kg)  03/20/21 154 lb (69.9 kg)    Health Maintenance Due  Topic Date Due   Pneumococcal Vaccine 35-57 Years old (1 - PCV) Never done   COVID-19 Vaccine (3 - Booster for Pfizer series) 09/02/2020    There are no preventive care reminders to display for this patient.   Lab Results  Component Value Date   TSH 1.150 06/08/2021   Lab Results  Component Value Date   WBC 6.9 06/08/2021   HGB 14.0 06/08/2021   HCT 42.0 06/08/2021   MCV 82 06/08/2021   PLT 358 06/08/2021   Lab Results  Component Value Date   NA 138 06/08/2021   K 3.7 06/08/2021   CO2 25 06/08/2021   GLUCOSE 95 06/08/2021   BUN 11 06/08/2021   CREATININE 0.82 06/08/2021   BILITOT 0.2 06/08/2021   ALKPHOS 102 06/08/2021   AST 21 06/08/2021   ALT 18 06/08/2021   PROT 7.3 06/08/2021   ALBUMIN 4.4 06/08/2021   CALCIUM 9.6 06/08/2021   ANIONGAP 8 06/19/2020   EGFR 97 06/08/2021   Lab Results  Component Value Date   CHOL 188 06/08/2021   Lab Results  Component Value Date   HDL 49  06/08/2021   Lab Results  Component Value Date   LDLCALC 127 (H) 06/08/2021   Lab Results  Component Value Date   TRIG 67 06/08/2021   No results  found for: [facility] Lab Results  Component Value Date   HGBA1C 5.2 06/08/2021       Assessment & Plan:   Problem List Items Addressed This Visit   None    No orders of the defined types were placed in this encounter.    Charyl Dancer, NP

## 2021-06-22 ENCOUNTER — Ambulatory Visit: Payer: No Typology Code available for payment source | Admitting: Nurse Practitioner

## 2021-06-22 DIAGNOSIS — R310 Gross hematuria: Secondary | ICD-10-CM

## 2021-06-23 ENCOUNTER — Other Ambulatory Visit: Payer: Self-pay

## 2021-06-23 MED FILL — Albuterol Sulfate Inhal Aero 108 MCG/ACT (90MCG Base Equiv): RESPIRATORY_TRACT | 25 days supply | Qty: 18 | Fill #1 | Status: AC

## 2021-06-25 ENCOUNTER — Encounter: Payer: Self-pay | Admitting: Nurse Practitioner

## 2021-06-25 ENCOUNTER — Other Ambulatory Visit: Payer: Self-pay

## 2021-06-25 ENCOUNTER — Ambulatory Visit (INDEPENDENT_AMBULATORY_CARE_PROVIDER_SITE_OTHER): Payer: No Typology Code available for payment source | Admitting: Nurse Practitioner

## 2021-06-25 VITALS — BP 111/67 | HR 118 | Temp 102.5°F | Wt 160.1 lb

## 2021-06-25 DIAGNOSIS — R509 Fever, unspecified: Secondary | ICD-10-CM | POA: Diagnosis not present

## 2021-06-25 DIAGNOSIS — J101 Influenza due to other identified influenza virus with other respiratory manifestations: Secondary | ICD-10-CM | POA: Diagnosis not present

## 2021-06-25 DIAGNOSIS — R300 Dysuria: Secondary | ICD-10-CM | POA: Diagnosis not present

## 2021-06-25 LAB — URINALYSIS, ROUTINE W REFLEX MICROSCOPIC
Bilirubin, UA: NEGATIVE
Glucose, UA: NEGATIVE
Ketones, UA: NEGATIVE
Leukocytes,UA: NEGATIVE
Nitrite, UA: NEGATIVE
Protein,UA: NEGATIVE
Specific Gravity, UA: 1.025 (ref 1.005–1.030)
Urobilinogen, Ur: 0.2 mg/dL (ref 0.2–1.0)
pH, UA: 6 (ref 5.0–7.5)

## 2021-06-25 LAB — MICROSCOPIC EXAMINATION
Bacteria, UA: NONE SEEN
WBC, UA: NONE SEEN /hpf (ref 0–5)

## 2021-06-25 LAB — VERITOR FLU A/B WAIVED
Influenza A: POSITIVE — AB
Influenza B: NEGATIVE

## 2021-06-25 MED ORDER — XOFLUZA (80 MG DOSE) 1 X 80 MG PO TBPK: 1.0000 | ORAL_TABLET | Freq: Once | ORAL | 0 refills | Status: AC

## 2021-06-25 MED ORDER — ONDANSETRON 4 MG PO TBDP: 4.0000 mg | ORAL_TABLET | Freq: Three times a day (TID) | ORAL | 0 refills | Status: DC | PRN

## 2021-06-25 NOTE — Progress Notes (Signed)
Acute Office Visit  Subjective:    Patient ID: Katelyn Whitehead, female    DOB: 1990-10-03, 30 y.o.   MRN: [id number]  Chief Complaint  Patient presents with   Generalized Body Aches    Productive Cough with green mucous. Congestion. Fever of 102 on Tuesday night and still ongoing. Took at home Covid test today, was negative.    HPI Patient is in today for fever, congestion, and body aches since Tuesday.   UPPER RESPIRATORY TRACT INFECTION  Worst symptom: fever, body aches Fever: no Cough: yes Shortness of breath: no Wheezing: yes Chest pain: no Chest tightness: yes Chest congestion: yes Nasal congestion: yes Runny nose: yes Post nasal drip: yes Sneezing: yes Sore throat: yes Swollen glands: yes Sinus pressure: yes Headache: yes Face pain: yes Toothache: yes Ear pain: no  Ear pressure: yes bilateral Eyes red/itching:no Eye drainage/crusting: yes  Vomiting: yes Rash: no Fatigue: yes Sick contacts: no Strep contacts: no  Context: worse Recurrent sinusitis: no Relief with OTC cold/cough medications: yes  Treatments attempted: vicks vapor day/night, ibuprofen    Past Medical History:  Diagnosis Date   ADHD (attention deficit hyperactivity disorder)    Anxiety    Asthma    childhood   COVID-19 virus infection 08/2020   Depression    Kidney stone    Mild mitral and aortic regurgitation    a. 04/2013 Echo: EF 50-55%, mild AI, mild MR, no MVP.   Mitral valve prolapse    Palpitations    a. 2012 Holter: sinus tach, pvc's.   Panic disorder    Urticaria     Past Surgical History:  Procedure Laterality Date   CYSTOSCOPY/URETEROSCOPY/HOLMIUM LASER/STENT PLACEMENT Right 02/26/2021   Procedure: CYSTOSCOPY/URETEROSCOPY, RIGHT STENT PLACEMENT;  Surgeon: Billey Co, MD;  Location: [facility];  Service: Urology;  Laterality: Right;   CYSTOSCOPY/URETEROSCOPY/HOLMIUM LASER/STENT PLACEMENT Right 03/20/2021   Procedure: CYSTOSCOPY/URETEROSCOPY/HOLMIUM LASER/STENT  EXCHANGE;  Surgeon: Billey Co, MD;  Location: [facility];  Service: Urology;  Laterality: Right;   IUD REMOVAL N/A 11/22/2016   Procedure: LAPAROCOPIC INTRAUTERINE DEVICE (IUD) REMOVAL;  Surgeon: Harlin Heys, MD;  Location: [facility];  Service: Gynecology;  Laterality: N/A;   TYMPANOSTOMY TUBE PLACEMENT     WISDOM TOOTH EXTRACTION      Family History  Problem Relation Age of Onset   Depression Mother    Diabetes Mother        type 2   Hyperlipidemia Mother    Hypertension Mother    Melanoma Mother        skin   Asthma Mother    Allergic rhinitis Mother    Drug abuse Father    Gout Father    Depression Father    Psychiatric Illness Father        PTSD   Allergic rhinitis Father    Urticaria Father    Allergic rhinitis Sister    ADD / ADHD Brother    Crohn's disease Daughter    Depression Maternal Aunt    Dementia Maternal Grandmother    Depression Maternal Grandmother    Heart disease Maternal Grandfather    Diabetes Paternal Grandmother        type 2   Hypertension Paternal Grandmother    Stroke Paternal Grandfather     Social History   Socioeconomic History   Marital status: Married    Spouse name: Not on file   Number of children: Not on file   Years of education: Not on file  Highest education level: Not on file  Occupational History   Not on file  Tobacco Use   Smoking status: Never   Smokeless tobacco: Never  Vaping Use   Vaping Use: Never used  Substance and Sexual Activity   Alcohol use: No    Alcohol/week: 0.0 standard drinks   Drug use: No   Sexual activity: Yes    Birth control/protection: Pill  Other Topics Concern   Not on file  Social History Narrative   Not on file   Social Determinants of Health   Financial Resource Strain: Not on file  Food Insecurity: Not on file  Transportation Needs: Not on file  Physical Activity: Not on file  Stress: Not on file  Social Connections: Not on file  Intimate Partner Violence: Not on  file    Outpatient Medications Prior to Visit  Medication Sig Dispense Refill   acetaminophen (TYLENOL) 500 MG tablet Take 500 mg by mouth every 6 (six) hours as needed.     albuterol (VENTOLIN HFA) 108 (90 Base) MCG/ACT inhaler TAKE 2 PUFFS BY MOUTH EVERY 6 HOURS AS NEEDED FOR WHEEZE OR SHORTNESS OF BREATH 18 g 2   diphenhydrAMINE HCl (BENADRYL ALLERGY PO) Take by mouth as needed.      Drospirenone (SLYND) 4 MG TABS Take 4 mg by mouth daily. 28 tablet 11   ibuprofen (ADVIL) 800 MG tablet Take 1 tablet (800 mg total) by mouth every 8 (eight) hours as needed. 20 tablet 0   lisdexamfetamine (VYVANSE) 40 MG capsule Take 1 capsule (40 mg total) by mouth every morning. 30 capsule 0   lisdexamfetamine (VYVANSE) 40 MG capsule Take 1 capsule (40 mg total) by mouth every morning. 30 capsule 0   lisdexamfetamine (VYVANSE) 40 MG capsule Take 1 capsule (40 mg total) by mouth every morning. 30 capsule 0   lisdexamfetamine (VYVANSE) 40 MG capsule Take 1 capsule (40 mg total) by mouth every morning. 30 capsule 0   LORazepam (ATIVAN) 0.5 MG tablet Take 1/2 in morning and 1/2 to 1 tab in evening as needed for high anxiety 45 tablet 1   sertraline (ZOLOFT) 100 MG tablet Take 1 and a half tablets by mouth daily 135 tablet 1   No facility-administered medications prior to visit.    Allergies  Allergen Reactions   Influenza Vaccines Hives   Sulfa Antibiotics     Hives Rash    Review of Systems  Constitutional:  Positive for fatigue and fever.  HENT:  Positive for congestion, postnasal drip, rhinorrhea, sneezing and sore throat. Negative for ear pain and sinus pressure.   Eyes: Negative.   Respiratory:  Positive for cough. Negative for shortness of breath.   Cardiovascular: Negative.   Gastrointestinal:  Positive for diarrhea and nausea. Negative for abdominal pain, constipation and vomiting.  Endocrine: Negative.   Genitourinary:  Positive for dysuria and urgency. Negative for frequency and hematuria.   Musculoskeletal:  Positive for myalgias.  Skin: Negative.   Neurological:  Positive for headaches. Negative for dizziness.  Psychiatric/Behavioral: Negative.        Objective:    Physical Exam Vitals and nursing note reviewed.  Constitutional:      General: She is not in acute distress.    Appearance: Normal appearance.  HENT:     Head: Normocephalic.     Right Ear: Tympanic membrane, ear canal and external ear normal.     Left Ear: Tympanic membrane, ear canal and external ear normal.  Eyes:  Conjunctiva/sclera: Conjunctivae normal.  Cardiovascular:     Rate and Rhythm: Regular rhythm. Tachycardia present.     Pulses: Normal pulses.     Heart sounds: Normal heart sounds.  Pulmonary:     Effort: Pulmonary effort is normal.     Breath sounds: Normal breath sounds.  Abdominal:     Palpations: Abdomen is soft.     Tenderness: There is no abdominal tenderness.  Musculoskeletal:     Cervical back: Normal range of motion.  Skin:    General: Skin is warm.  Neurological:     General: No focal deficit present.     Mental Status: She is alert and oriented to person, place, and time.  Psychiatric:        Mood and Affect: Mood normal.        Behavior: Behavior normal.        Thought Content: Thought content normal.        Judgment: Judgment normal.    BP 111/67   Pulse (!) 118   Temp (!) 102.5 F (39.2 C) (Oral)   Wt 160 lb 2 oz (72.6 kg)   SpO2 98%   BMI 29.48 kg/m  Wt Readings from Last 3 Encounters:  06/25/21 160 lb 2 oz (72.6 kg)  06/18/21 157 lb (71.2 kg)  06/08/21 161 lb 12.8 oz (73.4 kg)    Health Maintenance Due  Topic Date Due   Pneumococcal Vaccine 22-34 Years old (1 - PCV) Never done   COVID-19 Vaccine (3 - Booster for Pfizer series) 09/02/2020    There are no preventive care reminders to display for this patient.   Lab Results  Component Value Date   TSH 1.150 06/08/2021   Lab Results  Component Value Date   WBC 6.9 06/08/2021   HGB 14.0  06/08/2021   HCT 42.0 06/08/2021   MCV 82 06/08/2021   PLT 358 06/08/2021   Lab Results  Component Value Date   NA 138 06/08/2021   K 3.7 06/08/2021   CO2 25 06/08/2021   GLUCOSE 95 06/08/2021   BUN 11 06/08/2021   CREATININE 0.82 06/08/2021   BILITOT 0.2 06/08/2021   ALKPHOS 102 06/08/2021   AST 21 06/08/2021   ALT 18 06/08/2021   PROT 7.3 06/08/2021   ALBUMIN 4.4 06/08/2021   CALCIUM 9.6 06/08/2021   ANIONGAP 8 06/19/2020   EGFR 97 06/08/2021   Lab Results  Component Value Date   CHOL 188 06/08/2021   Lab Results  Component Value Date   HDL 49 06/08/2021   Lab Results  Component Value Date   LDLCALC 127 (H) 06/08/2021   Lab Results  Component Value Date   TRIG 67 06/08/2021   No results found for: CHOLHDL Lab Results  Component Value Date   HGBA1C 5.2 06/08/2021       Assessment & Plan:   Problem List Items Addressed This Visit   None Visit Diagnoses     Fever, unspecified fever cause    -  Primary   Positive for influenza A. See plan below   Relevant Orders   Novel Coronavirus, NAA ([facility])   Veritor Flu A/B Waived   Dysuria       U/A shows 2+ blood, no bacteria, leukocytes, or nitrites. Increase fluids.    Relevant Orders   Urinalysis, Routine w reflex microscopic   Influenza A       Will treat with xofluza. Alternate tylenol and ibuprofen for fever and body aches. Encourage rest and fluids.  F/U if symptoms worsen or don't improve   Relevant Medications   Baloxavir Marboxil,80 MG Dose, (XOFLUZA, 80 MG DOSE,) 1 x 80 MG TBPK        Meds ordered this encounter  Medications   Baloxavir Marboxil,80 MG Dose, (XOFLUZA, 80 MG DOSE,) 1 x 80 MG TBPK    Sig: Take 1 tablet by mouth once for 1 dose.    Dispense:  1 each    Refill:  0   ondansetron (ZOFRAN ODT) 4 MG disintegrating tablet    Sig: Take 1 tablet (4 mg total) by mouth every 8 (eight) hours as needed for nausea or vomiting.    Dispense:  20 tablet    Refill:  0     Charyl Dancer, NP

## 2021-06-27 LAB — NOVEL CORONAVIRUS, NAA: SARS-CoV-2, NAA: NOT DETECTED

## 2021-06-27 LAB — SARS-COV-2, NAA 2 DAY TAT

## 2021-07-08 ENCOUNTER — Other Ambulatory Visit: Payer: Self-pay

## 2021-07-08 MED ORDER — AMPHETAMINE-DEXTROAMPHETAMINE 10 MG PO TABS
ORAL_TABLET | ORAL | 0 refills | Status: DC
  Filled 2021-07-08: qty 30, 30d supply, fill #0

## 2021-07-08 MED ORDER — VYVANSE 40 MG PO CAPS
ORAL_CAPSULE | ORAL | 0 refills | Status: DC
  Filled 2021-08-24: qty 30, 30d supply, fill #0

## 2021-07-08 MED ORDER — LORAZEPAM 0.5 MG PO TABS
ORAL_TABLET | ORAL | 1 refills | Status: DC
Start: 2021-07-08 — End: 2022-06-24
  Filled 2021-07-10: qty 45, 30d supply, fill #0
  Filled 2021-11-05: qty 45, 30d supply, fill #1

## 2021-07-08 MED ORDER — VYVANSE 40 MG PO CAPS: ORAL_CAPSULE | ORAL | 0 refills | Status: DC

## 2021-07-08 MED ORDER — VYVANSE 40 MG PO CAPS
ORAL_CAPSULE | ORAL | 0 refills | Status: DC
  Filled 2021-11-27: qty 30, 30d supply, fill #0

## 2021-07-10 ENCOUNTER — Other Ambulatory Visit: Payer: Self-pay

## 2021-08-06 ENCOUNTER — Other Ambulatory Visit: Payer: Self-pay

## 2021-08-24 ENCOUNTER — Other Ambulatory Visit: Payer: Self-pay

## 2021-08-24 MED FILL — Albuterol Sulfate Inhal Aero 108 MCG/ACT (90MCG Base Equiv): RESPIRATORY_TRACT | 25 days supply | Qty: 18 | Fill #2 | Status: AC

## 2021-08-27 ENCOUNTER — Other Ambulatory Visit: Payer: Self-pay

## 2021-08-27 MED ORDER — METFORMIN HCL 500 MG PO TABS
ORAL_TABLET | ORAL | 1 refills | Status: DC
  Filled 2021-08-27: qty 60, 30d supply, fill #0
  Filled 2021-11-27: qty 60, 30d supply, fill #1

## 2021-09-22 ENCOUNTER — Other Ambulatory Visit: Payer: Self-pay | Admitting: Nurse Practitioner

## 2021-09-22 ENCOUNTER — Other Ambulatory Visit: Payer: Self-pay

## 2021-09-22 MED ORDER — CARESTART COVID-19 HOME TEST VI KIT
PACK | 0 refills | Status: DC
  Filled 2021-09-22: qty 2, 4d supply, fill #0

## 2021-09-23 ENCOUNTER — Other Ambulatory Visit: Payer: Self-pay

## 2021-09-23 MED ORDER — ALBUTEROL SULFATE HFA 108 (90 BASE) MCG/ACT IN AERS
INHALATION_SPRAY | RESPIRATORY_TRACT | 2 refills | Status: DC
  Filled 2021-09-23: qty 18, 25d supply, fill #0
  Filled 2021-11-05: qty 18, 25d supply, fill #1
  Filled 2021-12-28 (×2): qty 18, 25d supply, fill #2

## 2021-09-29 ENCOUNTER — Other Ambulatory Visit: Payer: Self-pay

## 2021-09-29 MED ORDER — VYVANSE 40 MG PO CAPS
ORAL_CAPSULE | ORAL | 0 refills | Status: DC
  Filled 2022-02-24: qty 30, 30d supply, fill #0

## 2021-09-29 MED ORDER — SERTRALINE HCL 100 MG PO TABS
ORAL_TABLET | ORAL | 1 refills | Status: DC
  Filled 2021-09-29: qty 135, 90d supply, fill #0

## 2021-09-29 MED ORDER — VYVANSE 40 MG PO CAPS
ORAL_CAPSULE | ORAL | 0 refills | Status: DC
  Filled 2021-09-29: qty 30, 30d supply, fill #0

## 2021-09-29 MED ORDER — LORAZEPAM 0.5 MG PO TABS
ORAL_TABLET | ORAL | 1 refills | Status: DC
  Filled 2021-09-29: qty 45, 30d supply, fill #0
  Filled 2022-02-24: qty 45, 30d supply, fill #1

## 2021-09-29 MED ORDER — AMPHETAMINE-DEXTROAMPHETAMINE 10 MG PO TABS
ORAL_TABLET | ORAL | 0 refills | Status: DC
  Filled 2021-09-29: qty 60, 30d supply, fill #0

## 2021-09-29 MED ORDER — VYVANSE 40 MG PO CAPS: ORAL_CAPSULE | ORAL | 0 refills | Status: DC

## 2021-10-28 ENCOUNTER — Other Ambulatory Visit: Payer: Self-pay | Admitting: Nurse Practitioner

## 2021-10-29 ENCOUNTER — Other Ambulatory Visit: Payer: Self-pay

## 2021-10-29 MED ORDER — SLYND 4 MG PO TABS
4.0000 mg | ORAL_TABLET | Freq: Every day | ORAL | 11 refills | Status: DC
  Filled 2021-10-29: qty 84, 84d supply, fill #0
  Filled 2022-01-15: qty 84, 84d supply, fill #1
  Filled 2022-04-08: qty 84, 84d supply, fill #2

## 2021-11-05 ENCOUNTER — Other Ambulatory Visit: Payer: Self-pay

## 2021-11-27 ENCOUNTER — Other Ambulatory Visit: Payer: Self-pay

## 2021-12-09 ENCOUNTER — Other Ambulatory Visit: Payer: Self-pay

## 2021-12-09 MED ORDER — FLUOCINONIDE 0.05 % EX CREA
TOPICAL_CREAM | CUTANEOUS | 0 refills | Status: DC
  Filled 2021-12-09 (×2): qty 30, 20d supply, fill #0

## 2021-12-10 ENCOUNTER — Other Ambulatory Visit: Payer: Self-pay

## 2021-12-11 ENCOUNTER — Other Ambulatory Visit: Payer: Self-pay

## 2021-12-24 ENCOUNTER — Other Ambulatory Visit: Payer: Self-pay

## 2021-12-24 MED ORDER — LORAZEPAM 0.5 MG PO TABS
ORAL_TABLET | ORAL | 1 refills | Status: DC
  Filled 2021-12-24: qty 45, 30d supply, fill #0

## 2021-12-24 MED ORDER — VYVANSE 40 MG PO CAPS
ORAL_CAPSULE | ORAL | 0 refills | Status: DC
  Filled 2022-04-23: qty 30, 30d supply, fill #0

## 2021-12-24 MED ORDER — AMPHETAMINE-DEXTROAMPHETAMINE 10 MG PO TABS
ORAL_TABLET | ORAL | 0 refills | Status: DC
  Filled 2021-12-24: qty 60, 30d supply, fill #0

## 2021-12-24 MED ORDER — VYVANSE 40 MG PO CAPS
ORAL_CAPSULE | ORAL | 0 refills | Status: DC
  Filled 2022-03-26: qty 30, 30d supply, fill #0

## 2021-12-24 MED ORDER — VYVANSE 40 MG PO CAPS
ORAL_CAPSULE | ORAL | 0 refills | Status: DC
  Filled 2021-12-24: qty 30, 30d supply, fill #0

## 2021-12-28 ENCOUNTER — Other Ambulatory Visit: Payer: Self-pay

## 2021-12-28 ENCOUNTER — Other Ambulatory Visit: Payer: Self-pay | Admitting: Nurse Practitioner

## 2021-12-29 ENCOUNTER — Other Ambulatory Visit: Payer: Self-pay

## 2021-12-29 MED ORDER — ALBUTEROL SULFATE HFA 108 (90 BASE) MCG/ACT IN AERS
INHALATION_SPRAY | RESPIRATORY_TRACT | 2 refills | Status: DC
  Filled 2022-02-24: qty 18, 25d supply, fill #0
  Filled 2022-03-26: qty 18, 25d supply, fill #1
  Filled 2022-04-08: qty 18, 25d supply, fill #2
  Filled 2022-04-08: qty 6.7, 25d supply, fill #2
  Filled 2022-05-27: qty 6.7, 25d supply, fill #3
  Filled 2022-06-30: qty 6.7, 25d supply, fill #4

## 2021-12-29 NOTE — Telephone Encounter (Signed)
Requested Prescriptions  Pending Prescriptions Disp Refills  . albuterol (VENTOLIN HFA) 108 (90 Base) MCG/ACT inhaler 18 g 2    Sig: TAKE 2 PUFFS BY MOUTH EVERY 6 HOURS AS NEEDED FOR WHEEZE OR SHORTNESS OF BREATH     Pulmonology:  [facility] 2 Failed - 12/28/2021 12:15 PM      Failed - Last Heart Rate in normal range    Pulse Readings from Last 1 Encounters:  06/25/21 (!) 118         Passed - Last BP in normal range    BP Readings from Last 1 Encounters:  06/25/21 111/67         Passed - Valid encounter within last 12 months    Recent Outpatient Visits          6 months ago Fever, unspecified fever cause   [facility] McElwee, Lauren A, NP   6 months ago Fatigue, unspecified type   [facility] McElwee, Lauren A, NP   10 months ago Persistent asthma without complication, unspecified asthma severity   [facility] McElwee, Lauren A, NP   1 year ago Frequent UTI   [facility] McElwee, Lauren A, NP   1 year ago COVID-19   [facility], Darl Householder, DO      Future Appointments            In 5 months Larae Grooms, NP [facility]

## 2022-01-05 ENCOUNTER — Encounter: Payer: Self-pay | Admitting: Nurse Practitioner

## 2022-01-08 ENCOUNTER — Other Ambulatory Visit: Payer: Self-pay

## 2022-01-13 ENCOUNTER — Other Ambulatory Visit: Payer: Self-pay

## 2022-01-13 MED ORDER — SLYND 4 MG PO TABS: 4.0000 mg | ORAL_TABLET | Freq: Every day | ORAL | 3 refills | Status: DC

## 2022-01-13 MED ORDER — NORETHIN-ETH ESTRAD-FE BIPHAS 1 MG-10 MCG / 10 MCG PO TABS
1.0000 | ORAL_TABLET | Freq: Every day | ORAL | 3 refills | Status: DC
  Filled 2022-01-13: qty 84, 84d supply, fill #0

## 2022-01-14 ENCOUNTER — Other Ambulatory Visit: Payer: Self-pay

## 2022-01-15 ENCOUNTER — Other Ambulatory Visit: Payer: Self-pay

## 2022-01-15 MED ORDER — NORETHINDRONE 0.35 MG PO TABS
1.0000 | ORAL_TABLET | Freq: Every day | ORAL | 3 refills | Status: DC
  Filled 2022-01-15: qty 28, 28d supply, fill #0

## 2022-02-24 ENCOUNTER — Other Ambulatory Visit: Payer: Self-pay

## 2022-02-24 MED ORDER — SERTRALINE HCL 100 MG PO TABS
ORAL_TABLET | ORAL | 1 refills | Status: DC
  Filled 2022-02-24: qty 135, 90d supply, fill #0
  Filled 2022-06-30: qty 135, 90d supply, fill #1

## 2022-02-25 ENCOUNTER — Other Ambulatory Visit: Payer: Self-pay

## 2022-02-26 ENCOUNTER — Other Ambulatory Visit: Payer: Self-pay

## 2022-03-26 ENCOUNTER — Other Ambulatory Visit: Payer: Self-pay

## 2022-04-08 ENCOUNTER — Other Ambulatory Visit: Payer: Self-pay

## 2022-04-15 ENCOUNTER — Other Ambulatory Visit: Payer: Self-pay

## 2022-04-21 ENCOUNTER — Other Ambulatory Visit: Payer: Self-pay

## 2022-04-21 MED ORDER — LISDEXAMFETAMINE DIMESYLATE 40 MG PO CAPS
ORAL_CAPSULE | ORAL | 0 refills | Status: DC
  Filled 2022-06-30: qty 30, 30d supply, fill #0

## 2022-04-21 MED ORDER — LORAZEPAM 0.5 MG PO TABS
ORAL_TABLET | ORAL | 1 refills | Status: DC
  Filled 2022-04-21: qty 45, 30d supply, fill #0
  Filled 2022-06-30: qty 45, 30d supply, fill #1

## 2022-04-21 MED ORDER — LISDEXAMFETAMINE DIMESYLATE 40 MG PO CAPS: ORAL_CAPSULE | ORAL | 0 refills | Status: DC

## 2022-04-21 MED ORDER — AMPHETAMINE-DEXTROAMPHETAMINE 10 MG PO TABS
ORAL_TABLET | ORAL | 0 refills | Status: DC
  Filled 2022-04-21: qty 60, 30d supply, fill #0

## 2022-04-23 ENCOUNTER — Other Ambulatory Visit: Payer: Self-pay

## 2022-05-03 ENCOUNTER — Encounter: Payer: Self-pay | Admitting: Urology

## 2022-05-11 ENCOUNTER — Encounter: Payer: Self-pay | Admitting: Nurse Practitioner

## 2022-05-21 ENCOUNTER — Encounter: Payer: Self-pay | Admitting: Nurse Practitioner

## 2022-05-27 ENCOUNTER — Other Ambulatory Visit: Payer: Self-pay

## 2022-06-08 NOTE — Progress Notes (Deleted)
There were no vitals taken for this visit.   Subjective:    Patient ID: Katelyn Whitehead, female    DOB: 1991/02/20, 31 y.o.   MRN: [id number]  HPI: Katelyn Whitehead is a 32 y.o. female presenting on 06/09/2022 for comprehensive medical examination. Current medical complaints include:would like to change OCP, feels it is making her gain weight and is also the cause of her worsening mood symptoms.  She currently lives with: husband and daughter  ANXIETY/STRESS Duration:uncontrolled Anxious mood: yes  Excessive worrying: yes Irritability: yes  Sweating: no Nausea: no Palpitations:no Hyperventilation: no Panic attacks: no  Obscessions/compulsions: yes Depressed mood: yes Anhedonia: yes Weight changes: yes (gain) Insomnia: yes  Hypersomnia: no Fatigue/loss of energy: yes Feelings of worthlessness: no Feelings of guilt: no Impaired concentration/indecisiveness: yes Suicidal ideations: no  Crying spells: no Recent Stressors/Life Changes: yes   Relationship problems: no   Family stress: no     Financial stress: no    Job stress: yes    Recent death/loss: no  Depression Screen done today and results listed below:     06/08/2021    3:41 PM 08/20/2020    2:59 PM 04/01/2020    2:17 PM 09/10/2019    4:10 PM 01/27/2018   11:39 AM  Depression screen PHQ 2/9  Decreased Interest 0 0 1 1   Down, Depressed, Hopeless 0 _0 PHQ - 2 Score 0 _1 Altered sleeping _2 Tired, decreased energy _3 Change in appetite 2 0 1 2   Feeling bad or failure about yourself  1 0 1 2   Trouble concentrating _4 Moving slowly or fidgety/restless 0 0 1 1   Suicidal thoughts 0 0 0 0   PHQ-9 Score _5 Difficult doing work/chores Somewhat difficult  Not difficult at all Somewhat difficult      Information is confidential and restricted. Go to Review Flowsheets to unlock data.     The patient does not have a history of falls. I did complete a risk assessment for  falls. A plan of care for falls was not documented.   Past Medical History:  Past Medical History:  Diagnosis Date  . ADHD (attention deficit hyperactivity disorder)   . Anxiety   . Asthma    childhood  . COVID-19 virus infection 08/2020  . Depression   . Kidney stone   . Mild mitral and aortic regurgitation    a. 04/2013 Echo: EF 50-55%, mild AI, mild MR, no MVP.  Marland Kitchen Mitral valve prolapse   . Palpitations    a. 2012 Holter: sinus tach, pvc's.  . Panic disorder   . Urticaria     Surgical History:  Past Surgical History:  Procedure Laterality Date  . CYSTOSCOPY/URETEROSCOPY/HOLMIUM LASER/STENT PLACEMENT Right 02/26/2021   Procedure: CYSTOSCOPY/URETEROSCOPY, RIGHT STENT PLACEMENT;  Surgeon: Billey Co, MD;  Location: [facility];  Service: Urology;  Laterality: Right;  . CYSTOSCOPY/URETEROSCOPY/HOLMIUM LASER/STENT PLACEMENT Right 03/20/2021   Procedure: CYSTOSCOPY/URETEROSCOPY/HOLMIUM LASER/STENT EXCHANGE;  Surgeon: Billey Co, MD;  Location: [facility];  Service: Urology;  Laterality: Right;  . IUD REMOVAL N/A 11/22/2016   Procedure: LAPAROCOPIC INTRAUTERINE DEVICE (IUD) REMOVAL;  Surgeon: Harlin Heys, MD;  Location: [facility];  Service: Gynecology;  Laterality: N/A;  . TYMPANOSTOMY TUBE PLACEMENT    . WISDOM TOOTH EXTRACTION      Medications:  Current Outpatient Medications on File Prior to Visit  Medication Sig  . acetaminophen (TYLENOL) 500 MG tablet Take 500 mg by mouth every 6 (six) hours as needed.  Marland Kitchen albuterol (VENTOLIN HFA) 108 (90 Base) MCG/ACT inhaler TAKE 2 PUFFS BY MOUTH EVERY 6 HOURS AS NEEDED FOR WHEEZE OR SHORTNESS OF BREATH  . amphetamine-dextroamphetamine (ADDERALL) 10 MG tablet Take 1 tablet (10 mg total) by mouth Two (2) times a day.  . amphetamine-dextroamphetamine (ADDERALL) 10 MG tablet Take 1 tablet by mouth Two (2) times a day.  Marland Kitchen COVID-19 At Home Antigen Test [facility] COVID-19 HOME TEST) KIT Use as directed  . diphenhydrAMINE HCl (BENADRYL  ALLERGY PO) Take by mouth as needed.   . Drospirenone (SLYND) 4 MG TABS Take 4 mg by mouth daily.  . fluocinonide cream (LIDEX) 0.05 % Apply thin layer to affected areas bid prn flares. Do not apply on clear skin.  Marland Kitchen ibuprofen (ADVIL) 800 MG tablet Take 1 tablet (800 mg total) by mouth every 8 (eight) hours as needed.  Marland Kitchen lisdexamfetamine (VYVANSE) 40 MG capsule Take 1 capsule (40 mg total) by mouth every morning.  . lisdexamfetamine (VYVANSE) 40 MG capsule Take 1 capsule (40 mg total) by mouth every morning.  . lisdexamfetamine (VYVANSE) 40 MG capsule Take 1 capsule (40 mg total) by mouth every morning.  . lisdexamfetamine (VYVANSE) 40 MG capsule Take 1 capsule (40 mg total) by mouth every morning.  . lisdexamfetamine (VYVANSE) 40 MG capsule Take 1 capsule (40 mg total) by mouth every morning.  . lisdexamfetamine (VYVANSE) 40 MG capsule Take 1 capsule (40 mg total) by mouth every morning.  . lisdexamfetamine (VYVANSE) 40 MG capsule Take 1 capsule (40 mg total) by mouth every morning.  . lisdexamfetamine (VYVANSE) 40 MG capsule Take 1 capsule (40 mg total) by mouth every morning.  . lisdexamfetamine (VYVANSE) 40 MG capsule Take 1 capsule (40 mg total) by mouth every morning.  . lisdexamfetamine (VYVANSE) 40 MG capsule Take 1 capsule (40 mg total) by mouth every morning.  . lisdexamfetamine (VYVANSE) 40 MG capsule Take 1 capsule (40 mg total) by mouth every morning.  . lisdexamfetamine (VYVANSE) 40 MG capsule Take 1 capsule (40 mg total) by mouth every morning.  . lisdexamfetamine (VYVANSE) 40 MG capsule Take 1 capsule (40 mg total) by mouth every morning.  . lisdexamfetamine (VYVANSE) 40 MG capsule Take 1 capsule by mouth every morning  . lisdexamfetamine (VYVANSE) 40 MG capsule Take 1 capsule by mouth every morning  . [START ON 06/22/2022] lisdexamfetamine (VYVANSE) 40 MG capsule Take 1 capsule by mouth every morning  . LORazepam (ATIVAN) 0.5 MG tablet Take 1/2 in morning and 1/2 to 1 tab in  evening as needed for high anxiety  . LORazepam (ATIVAN) 0.5 MG tablet Take 1/2 in morning and 1/2 to 1 tab in evening as needed for high anxiety  . LORazepam (ATIVAN) 0.5 MG tablet Take 1/2 in morning and 1/2 to 1 tab in evening as needed for high anxiety  . LORazepam (ATIVAN) 0.5 MG tablet Take 1/2 tablet by mouth in morning and 1/2 to 1 tablet in evening as needed for high anxiety  . metFORMIN (GLUCOPHAGE) 500 MG tablet Take 1/2 tab twice daily x 7 days then take 1 tab twice daily  . norethindrone (ORTHO MICRONOR) 0.35 MG tablet Take 1 tablet (0.35 mg total) by mouth daily.  . ondansetron (ZOFRAN ODT) 4 MG disintegrating tablet Take 1 tablet (4 mg total) by mouth every 8 (eight) hours as needed  for nausea or vomiting.  . sertraline (ZOLOFT) 100 MG tablet Take 1 and a half tablets by mouth daily  . sertraline (ZOLOFT) 100 MG tablet Take 1 & 1/2 tablets by mouth daily  . sertraline (ZOLOFT) 100 MG tablet Take 1 & 1/2 tablets (1109m) by mouth daily   No current facility-administered medications on file prior to visit.    Allergies:  Allergies  Allergen Reactions  . Influenza Vaccines Hives  . Sulfa Antibiotics     Hives Rash    Social History:  Social History   Socioeconomic History  . Marital status: Married    Spouse name: Not on file  . Number of children: Not on file  . Years of education: Not on file  . Highest education level: Not on file  Occupational History  . Not on file  Tobacco Use  . Smoking status: Never  . Smokeless tobacco: Never  Vaping Use  . Vaping Use: Never used  Substance and Sexual Activity  . Alcohol use: No    Alcohol/week: 0.0 standard drinks of alcohol  . Drug use: No  . Sexual activity: Yes    Birth control/protection: Pill  Other Topics Concern  . Not on file  Social History Narrative  . Not on file   Social Determinants of Health   Financial Resource Strain: Not on file  Food Insecurity: Not on file  Transportation Needs: Not on file   Physical Activity: Not on file  Stress: Not on file  Social Connections: Not on file  Intimate Partner Violence: Not on file   Social History   Tobacco Use  Smoking Status Never  Smokeless Tobacco Never   Social History   Substance and Sexual Activity  Alcohol Use No  . Alcohol/week: 0.0 standard drinks of alcohol    Family History:  Family History  Problem Relation Age of Onset  . Depression Mother   . Diabetes Mother        type 2  . Hyperlipidemia Mother   . Hypertension Mother   . Melanoma Mother        skin  . Asthma Mother   . Allergic rhinitis Mother   . Drug abuse Father   . Gout Father   . Depression Father   . Psychiatric Illness Father        PTSD  . Allergic rhinitis Father   . Urticaria Father   . Allergic rhinitis Sister   . ADD / ADHD Brother   . Crohn's disease Daughter   . Depression Maternal Aunt   . Dementia Maternal Grandmother   . Depression Maternal Grandmother   . Heart disease Maternal Grandfather   . Diabetes Paternal Grandmother        type 2  . Hypertension Paternal Grandmother   . Stroke Paternal Grandfather     Past medical history, surgical history, medications, allergies, family history and social history reviewed with patient today and changes made to appropriate areas of the chart.   Review of Systems  Constitutional:  Positive for malaise/fatigue.  HENT: Negative.  Negative for congestion, sinus pain and sore throat.   Eyes: Negative.  Negative for blurred vision.  Respiratory: Negative.  Negative for cough, sputum production and wheezing.   Cardiovascular: Negative.  Negative for chest pain and palpitations.  Gastrointestinal: Negative.  Negative for constipation, diarrhea, nausea and vomiting.  Genitourinary: Negative.  Negative for dysuria and urgency.  Musculoskeletal: Negative.  Negative for back pain and myalgias.  Skin: Negative.  Negative for  rash.  Neurological:  Positive for weakness. Negative for dizziness,  tingling, tremors and headaches.  Psychiatric/Behavioral:  Positive for depression. Negative for suicidal ideas. The patient is nervous/anxious. The patient does not have insomnia.    All other ROS negative except what is listed above and in the HPI.      Objective:    There were no vitals taken for this visit.  Wt Readings from Last 3 Encounters:  06/25/21 160 lb 2 oz (72.6 kg)  06/18/21 157 lb (71.2 kg)  06/08/21 161 lb 12.8 oz (73.4 kg)    Physical Exam Vitals and nursing note reviewed.  Constitutional:      General: She is not in acute distress.    Appearance: Normal appearance. She is normal weight. She is not ill-appearing or toxic-appearing.  HENT:     Head: Normocephalic and atraumatic.     Right Ear: Tympanic membrane, ear canal and external ear normal.     Left Ear: Tympanic membrane, ear canal and external ear normal.     Nose: Nose normal. No congestion or rhinorrhea.     Mouth/Throat:     Mouth: Mucous membranes are moist.     Pharynx: Oropharynx is clear. No oropharyngeal exudate.  Eyes:     General: No scleral icterus.    Extraocular Movements: Extraocular movements intact.     Pupils: Pupils are equal, round, and reactive to light.  Cardiovascular:     Rate and Rhythm: Normal rate and regular rhythm.     Pulses: Normal pulses.     Heart sounds: Normal heart sounds. No murmur heard. Pulmonary:     Effort: Pulmonary effort is normal. No respiratory distress.     Breath sounds: No wheezing or rhonchi.  Abdominal:     General: Abdomen is flat. Bowel sounds are normal. There is no distension.     Palpations: Abdomen is soft.     Tenderness: There is no abdominal tenderness.  Musculoskeletal:        General: No swelling or tenderness. Normal range of motion.     Cervical back: Normal range of motion and neck supple. No rigidity or tenderness.     Right lower leg: No edema.     Left lower leg: No edema.  Skin:    General: Skin is warm and dry.     Capillary  Refill: Capillary refill takes less than 2 seconds.     Coloration: Skin is not jaundiced or pale.  Neurological:     General: No focal deficit present.     Mental Status: She is alert and oriented to person, place, and time.     Motor: No weakness.     Gait: Gait normal.  Psychiatric:        Mood and Affect: Mood normal.        Behavior: Behavior normal.        Thought Content: Thought content normal.        Judgment: Judgment normal.   Results for orders placed or performed in visit on 06/25/21  Novel Coronavirus, NAA ([facility])   Specimen: Nasopharyngeal(NP) swabs in vial transport medium  Result Value Ref Range   SARS-CoV-2, NAA Not Detected Not Detected  Microscopic Examination   Naso  Result Value Ref Range   WBC, UA None seen 0 - 5 /hpf   RBC, Urine 3-10 (A) 0 - 2 /hpf   Epithelial Cells (non renal) 0-10 0 - 10 /hpf   Mucus, UA Present (A) Not Estab.  Bacteria, UA None seen None seen/Few  SARS-COV-2, NAA 2 DAY TAT  Result Value Ref Range   SARS-CoV-2, NAA 2 DAY TAT Performed   Veritor Flu A/B Waived  Result Value Ref Range   Influenza A Positive (A) Negative   Influenza B Negative Negative  Urinalysis, Routine w reflex microscopic  Result Value Ref Range   Specific Gravity, UA 1.025 1.005 - 1.030   pH, UA 6.0 5.0 - 7.5   Color, UA Yellow Yellow   Appearance Ur Clear Clear   Leukocytes,UA Negative Negative   Protein,UA Negative Negative/Trace   Glucose, UA Negative Negative   Ketones, UA Negative Negative   RBC, UA 2+ (A) Negative   Bilirubin, UA Negative Negative   Urobilinogen, Ur 0.2 0.2 - 1.0 mg/dL   Nitrite, UA Negative Negative   Microscopic Examination See below:       Assessment & Plan:   Problem List Items Addressed This Visit      Other   Depression, major, recurrent, moderate ([place])  Other Visit Diagnoses    Annual physical exam    -  Primary       Follow up plan: No follow-ups on file.   LABORATORY TESTING:  - Pap smear: up to  date  IMMUNIZATIONS:   - Tdap: Up to date - Influenza: Refused - allergic - Pneumovax: Not applicable - Prevnar: Not applicable - HPV: Refused - Zostavax vaccine: Not applicable  SCREENING: -Mammogram: Not applicable  - Colonoscopy: Not applicable  - Bone Density: Not applicable  -Hearing Test: Not applicable  -Spirometry: Not applicable   PATIENT COUNSELING:   Advised to take 1 mg of folate supplement per day if capable of pregnancy.   Sexuality: Discussed sexually transmitted diseases, partner selection, use of condoms, avoidance of unintended pregnancy  and contraceptive alternatives.  dvised to avoid cigarette smoking.  I discussed with the patient that most people either abstain from alcohol or drink within safe limits (<=14/week and <=4 drinks/occasion for males, <=7/weeks and <= 3 drinks/occasion for females) and that the risk for alcohol disorders and other health effects rises proportionally with the number of drinks per week and how often a drinker exceeds daily limits.  Discussed cessation/primary prevention of drug use and availability of treatment for abuse.   Diet: Encouraged to adjust caloric intake to maintain  or achieve ideal body weight, to reduce intake of dietary saturated fat and total fat, to limit sodium intake by avoiding high sodium foods and not adding table salt, and to maintain adequate dietary potassium and calcium preferably from fresh fruits, vegetables, and low-fat dairy products.    Stressed the importance of regular exercise.  Injury prevention: Discussed safety belts, safety helmets, smoke detector, smoking near bedding or upholstery.   Dental health: Discussed importance of regular tooth brushing, flossing, and dental visits.    NEXT PREVENTATIVE PHYSICAL DUE IN 1 YEAR. No follow-ups on file.

## 2022-06-09 ENCOUNTER — Encounter: Payer: No Typology Code available for payment source | Admitting: Nurse Practitioner

## 2022-06-23 ENCOUNTER — Ambulatory Visit: Payer: No Typology Code available for payment source | Admitting: Urology

## 2022-06-24 ENCOUNTER — Ambulatory Visit
Admission: RE | Admit: 2022-06-24 | Discharge: 2022-06-24 | Disposition: A | Discharge status: Home / Self Care | Payer: No Typology Code available for payment source | Source: Ambulatory Visit | Attending: Urology | Admitting: Urology

## 2022-06-24 ENCOUNTER — Encounter: Payer: Self-pay | Admitting: Urology

## 2022-06-24 ENCOUNTER — Ambulatory Visit (INDEPENDENT_AMBULATORY_CARE_PROVIDER_SITE_OTHER): Payer: No Typology Code available for payment source | Admitting: Urology

## 2022-06-24 ENCOUNTER — Other Ambulatory Visit: Payer: Self-pay | Admitting: *Deleted

## 2022-06-24 ENCOUNTER — Ambulatory Visit
Admission: RE | Admit: 2022-06-24 | Discharge: 2022-06-24 | Disposition: A | Discharge status: Home / Self Care | Payer: No Typology Code available for payment source | Attending: Urology | Admitting: Urology

## 2022-06-24 VITALS — BP 106/71 | HR 89 | Ht 62.0 in | Wt 155.0 lb

## 2022-06-24 DIAGNOSIS — N200 Calculus of kidney: Secondary | ICD-10-CM

## 2022-06-24 DIAGNOSIS — Z87440 Personal history of urinary (tract) infections: Secondary | ICD-10-CM | POA: Diagnosis not present

## 2022-06-24 DIAGNOSIS — N390 Urinary tract infection, site not specified: Secondary | ICD-10-CM

## 2022-06-24 DIAGNOSIS — Z87442 Personal history of urinary calculi: Secondary | ICD-10-CM | POA: Diagnosis not present

## 2022-06-24 NOTE — Progress Notes (Signed)
   06/24/2022 9:07 AM   Florentina Addison Rondel Oh [birth year] [id number]  Reason for visit: Follow up nephrolithiasis, recurrent UTIs, back pain  HPI: 31 year old female with recurrent UTIs who was found to have a 1 cm right UPJ stone and underwent ureteroscopy, laser lithotripsy and stent placement in August 2022.  She has done very well since that time and denies any recurrent infections since stone treatment.  She thinks she may have passed a small kidney stone in July 2023.  She does report some intermittent low back pain bilaterally that comes and goes over the last few months.  This does not feel similar to prior stone events.  I personally viewed and interpreted her KUB today that shows no definite ureteral stones, punctate left lower pole stone.  Her symptoms seem more consistent with musculoskeletal low back pain, and we discussed strengthening and stretching exercises.  Extensive patient information provided.  We discussed considering CT scan for more definitive evaluation of stones, but she would like to hold off at this time which I think is reasonable.  RTC 1 year KUB prior  Sondra Come, MD  [facility] [address] [place], [place] [address] [phone]

## 2022-06-24 NOTE — Patient Instructions (Signed)
Low Back Sprain or Strain Rehab Ask your health care provider which exercises are safe for you. Do exercises exactly as told by your health care provider and adjust them as directed. It is normal to feel mild stretching, pulling, tightness, or discomfort as you do these exercises. Stop right away if you feel sudden pain or your pain gets worse. Do not begin these exercises until told by your health care provider. Stretching and range-of-motion exercises These exercises warm up your muscles and joints and improve the movement and flexibility of your back. These exercises also help to relieve pain, numbness, and tingling. Lumbar rotation  Lie on your back on a firm bed or the floor with your knees bent. Straighten your arms out to your sides so each arm forms a 90-degree angle (right angle) with a side of your body. Slowly move (rotate) both of your knees to one side of your body until you feel a stretch in your lower back (lumbar). Try not to let your shoulders lift off the floor. Hold this position for __________ seconds. Tense your abdominal muscles and slowly move your knees back to the starting position. Repeat this exercise on the other side of your body. Repeat __________ times. Complete this exercise __________ times a day. Single knee to chest  Lie on your back on a firm bed or the floor with both legs straight. Bend one of your knees. Use your hands to move your knee up toward your chest until you feel a gentle stretch in your lower back and buttock. Hold your leg in this position by holding on to the front of your knee. Keep your other leg as straight as possible. Hold this position for __________ seconds. Slowly return to the starting position. Repeat with your other leg. Repeat __________ times. Complete this exercise __________ times a day. Prone extension on elbows  Lie on your abdomen on a firm bed or the floor (prone position). Prop yourself up on your elbows. Use your arms  to help lift your chest up until you feel a gentle stretch in your abdomen and your lower back. This will place some of your body weight on your elbows. If this is uncomfortable, try stacking pillows under your chest. Your hips should stay down, against the surface that you are lying on. Keep your hip and back muscles relaxed. Hold this position for __________ seconds. Slowly relax your upper body and return to the starting position. Repeat __________ times. Complete this exercise __________ times a day. Strengthening exercises These exercises build strength and endurance in your back. Endurance is the ability to use your muscles for a long time, even after they get tired. Pelvic tilt This exercise strengthens the muscles that lie deep in the abdomen. Lie on your back on a firm bed or the floor with your legs extended. Bend your knees so they are pointing toward the ceiling and your feet are flat on the floor. Tighten your lower abdominal muscles to press your lower back against the floor. This motion will tilt your pelvis so your tailbone points up toward the ceiling instead of pointing to your feet or the floor. To help with this exercise, you may place a small towel under your lower back and try to push your back into the towel. Hold this position for __________ seconds. Let your muscles relax completely before you repeat this exercise. Repeat __________ times. Complete this exercise __________ times a day. Alternating arm and leg raises  Get on your hands   and knees on a firm surface. If you are on a hard floor, you may want to use padding, such as an exercise mat, to cushion your knees. Line up your arms and legs. Your hands should be directly below your shoulders, and your knees should be directly below your hips. Lift your left leg behind you. At the same time, raise your right arm and straighten it in front of you. Do not lift your leg higher than your hip. Do not lift your arm higher  than your shoulder. Keep your abdominal and back muscles tight. Keep your hips facing the ground. Do not arch your back. Keep your balance carefully, and do not hold your breath. Hold this position for __________ seconds. Slowly return to the starting position. Repeat with your right leg and your left arm. Repeat __________ times. Complete this exercise __________ times a day. Abdominal set with straight leg raise  Lie on your back on a firm bed or the floor. Bend one of your knees and keep your other leg straight. Tense your abdominal muscles and lift your straight leg up, 4-6 inches (10-15 cm) off the ground. Keep your abdominal muscles tight and hold this position for __________ seconds. Do not hold your breath. Do not arch your back. Keep it flat against the ground. Keep your abdominal muscles tense as you slowly lower your leg back to the starting position. Repeat with your other leg. Repeat __________ times. Complete this exercise __________ times a day. Single leg lower with bent knees Lie on your back on a firm bed or the floor. Tense your abdominal muscles and lift your feet off the floor, one foot at a time, so your knees and hips are bent in 90-degree angles (right angles). Your knees should be over your hips and your lower legs should be parallel to the floor. Keeping your abdominal muscles tense and your knee bent, slowly lower one of your legs so your toe touches the ground. Lift your leg back up to return to the starting position. Do not hold your breath. Do not let your back arch. Keep your back flat against the ground. Repeat with your other leg. Repeat __________ times. Complete this exercise __________ times a day. Posture and body mechanics Good posture and healthy body mechanics can help to relieve stress in your body's tissues and joints. Body mechanics refers to the movements and positions of your body while you do your daily activities. Posture is part of body  mechanics. Good posture means: Your spine is in its natural S-curve position (neutral). Your shoulders are pulled back slightly. Your head is not tipped forward (neutral). Follow these guidelines to improve your posture and body mechanics in your everyday activities. Standing  When standing, keep your spine neutral and your feet about hip-width apart. Keep a slight bend in your knees. Your ears, shoulders, and hips should line up. When you do a task in which you stand in one place for a long time, place one foot up on a stable object that is 2-4 inches (5-10 cm) high, such as a footstool. This helps keep your spine neutral. Sitting  When sitting, keep your spine neutral and keep your feet flat on the floor. Use a footrest, if necessary, and keep your thighs parallel to the floor. Avoid rounding your shoulders, and avoid tilting your head forward. When working at a desk or a computer, keep your desk at a height where your hands are slightly lower than your elbows. Slide your   chair under your desk so you are close enough to maintain good posture. When working at a computer, place your monitor at a height where you are looking straight ahead and you do not have to tilt your head forward or downward to look at the screen. Resting When lying down and resting, avoid positions that are most painful for you. If you have pain with activities such as sitting, bending, stooping, or squatting, lie in a position in which your body does not bend very much. For example, avoid curling up on your side with your arms and knees near your chest (fetal position). If you have pain with activities such as standing for a long time or reaching with your arms, lie with your spine in a neutral position and bend your knees slightly. Try the following positions: Lying on your side with a pillow between your knees. Lying on your back with a pillow under your knees. Lifting  When lifting objects, keep your feet at least  shoulder-width apart and tighten your abdominal muscles. Bend your knees and hips and keep your spine neutral. It is important to lift using the strength of your legs, not your back. Do not lock your knees straight out. Always ask for help to lift heavy or awkward objects. This information is not intended to replace advice given to you by your health care provider. Make sure you discuss any questions you have with your health care provider. Document Revised: 10/13/2020 Document Reviewed: 10/13/2020 Elsevier Patient Education  2023 [employer]  Managing Chronic Back Pain Chronic back pain is back pain that lasts for 12 weeks or longer. It often affects the lower back. Back pain may feel like a muscle ache or a sharp, stabbing pain. It can be mild, moderate, or severe. If you have been diagnosed with chronic back pain, there are things you can do to manage your symptoms. You may have to try different things to see what works best for you. Your health care provider may also give you specific instructions. How to manage lifestyle changes Treating chronic back pain often starts with rest and pain relief, followed by exercises to restore movement and strength to your back (physical therapy). You may need surgery if other treatments do not help, or if your pain is caused by a condition or an injury. Follow your treatment plan as told by your health care provider. This may include: Relaxation techniques. Talk therapy or counseling with a mental health specialist. A form of talk therapy called cognitive behavioral therapy (CBT) can be especially helpful. This therapy helps you set goals and follow up on the changes that you make. Acupuncture or massage therapy. Local electrical stimulation. Injections. These deliver numbing or pain-relieving medicines into your spine or the area of pain. How to recognize changes in your chronic back pain Your condition may improve with treatment. However, back pain may  not go away or may get worse over time. Watch your symptoms carefully and let your health care provider know if your symptoms get worse or do not improve. Your back pain may be getting worse if you have: Pain that begins to cause problems with posture. Pain that gets worse when you are sitting, standing, walking, bending, or lifting. Pain that affects you while you are active, or at rest, or both. Pain that eventually makes it hard to move around (limits mobility). Pain that occurs with fever, weight loss, or difficulty urinating. Pain that causes numbness and tingling. How to use body mechanics  and posture to help with pain Healthy body mechanics and good posture can help to relieve stress on your back. Body mechanics refers to the movements and positions of your body during your daily activities. Posture is part of body mechanics. Good posture means: Your spine is in its natural S-curve, or neutral, position. Your shoulders are pulled back slightly. Your head is not tipped forward. Follow these guidelines to improve your posture and body mechanics in your everyday activities. Standing  When standing, keep your spine neutral and your feet about hip-width apart. Keep your knees slightly bent. Your ears, shoulders, and hips should line up. When you do a task in which you stand in one place for a long time, place one foot on a stable object that is 2-4 inches (5-10 cm) high, such as a footstool. This helps keep your spine neutral. Sitting  When sitting, keep your spine neutral and your feet flat on the floor. Use a footrest, if necessary, and keep your thighs parallel to the floor. Avoid rounding your shoulders, and avoid tilting your head forward. When working at a desk or a computer, keep your desk at a height where your hands are slightly lower than your elbows. Slide your chair under your desk so you are close enough to maintain good posture. When working at a computer, place your monitor at  a height where you are looking straight ahead and you do not have to tilt your head forward or downward to view the screen. Lifting  Keep your feet at least shoulder-width apart and tighten the muscles of your abdomen. Bend your knees and hips and keep your spine neutral. Be sure to lift using the strength of your legs, not your back. Do not lock your knees straight out. Always ask for help to lift heavy or awkward objects. Resting  When lying down and resting, avoid positions that are most painful. If you have pain with activities such as sitting, bending, stooping, or squatting, lie in a position in which your body does not bend very much. For example, avoid curling up on your side with your arms and knees near your chest (fetal position). If you have pain with activities such as standing for a long time or reaching with your arms, lie with your spine in a neutral position and bend your knees slightly. Try: Lying on your side with a pillow between your knees. Lying on your back with a pillow under your knees. Follow these instructions at home: Medicines Treatment may include over-the-counter or prescription medicines for pain and inflammation that are taken by mouth or applied to the skin. Another treatment may include muscle relaxants. Take over-the-counter and prescription medicines only as told by your health care provider. Ask your health care provider if the medicine prescribed to you: Requires you to avoid driving or using machinery. Can cause constipation. You may need to take these actions to prevent or treat constipation: Drink enough fluid to keep your urine pale yellow. Take over-the-counter or prescription medicines. Eat foods that are high in fiber, such as beans, whole grains, and fresh fruits and vegetables. Limit foods that are high in fat and processed sugars, such as fried or sweet foods. Lifestyle Do not use any products that contain nicotine or tobacco, such as  cigarettes, e-cigarettes, and chewing tobacco. If you need help quitting, ask your health care provider. Eat a healthy diet that includes foods such as vegetables, fruits, fish, and lean meats. Work with your health care provider to  achieve or maintain a healthy weight. General instructions Get regular exercise as told. Exercise improves flexibility and strength. If physical therapy was prescribed, do exercises as told by your health care provider. Use ice or heat therapy as told by your health care provider. Keep all follow-up visits as told by your health care provider. This is important. Where can I get support? Consider joining a support group for people managing chronic back pain. Ask your health care provider about support groups in your area. You can also find online and in-person support groups through: The American Chronic Pain Association: theacpa.org Pain Connection Program: painconnection.org Contact a health care provider if: You have pain that is not relieved with rest or medicine. Your pain gets worse, or you have new pain. You have a fever. You have rapid weight loss. You have trouble doing your normal activities. Get help right away if: You have weakness or numbness in one or both of your legs or feet. You have trouble controlling your bladder or your bowels. You have severe back pain and have any of the following: Nausea or vomiting. Abdominal pain. Shortness of breath or you faint. Summary Chronic back pain is often treated with rest, pain relief, and physical therapy. Talk therapy, acupuncture, massage, and local electrical stimulation may help. Follow your treatment plan as told by your health care provider. Joining a support group may help you manage chronic back pain. This information is not intended to replace advice given to you by your health care provider. Make sure you discuss any questions you have with your health care provider. Document Revised:  09/06/2019 Document Reviewed: 05/15/2019 Elsevier Patient Education  2023 [employer].

## 2022-06-25 ENCOUNTER — Other Ambulatory Visit: Payer: Self-pay

## 2022-06-25 MED ORDER — SLYND 4 MG PO TABS
ORAL_TABLET | ORAL | 4 refills | Status: DC
  Filled 2022-06-25: qty 84, 84d supply, fill #0
  Filled 2022-08-27: qty 84, 84d supply, fill #1

## 2022-06-30 ENCOUNTER — Other Ambulatory Visit: Payer: Self-pay

## 2022-06-30 ENCOUNTER — Other Ambulatory Visit: Payer: Self-pay | Admitting: Nurse Practitioner

## 2022-06-30 MED ORDER — ALBUTEROL SULFATE HFA 108 (90 BASE) MCG/ACT IN AERS
INHALATION_SPRAY | RESPIRATORY_TRACT | 2 refills | Status: DC
  Filled 2022-06-30: qty 6.7, 25d supply, fill #0
  Filled 2022-08-26: qty 6.7, 25d supply, fill #1
  Filled 2022-10-18: qty 6.7, 25d supply, fill #2

## 2022-06-30 NOTE — Telephone Encounter (Signed)
Requested medication (s) are due for refill today:   Yes  Requested medication (s) are on the active medication list:   Yes  Future visit scheduled:   Yes 08/05/2022 with Larae Grooms   Last ordered: 12/29/2021 18 g, 2 refills  Returned because per protocol unable to refill because no valid encounter for a year.   Provider to review for refills since she has an upcoming appt      Requested Prescriptions  Pending Prescriptions Disp Refills   albuterol (VENTOLIN HFA) 108 (90 Base) MCG/ACT inhaler 18 g 2    Sig: TAKE 2 PUFFS BY MOUTH EVERY 6 HOURS AS NEEDED FOR WHEEZE OR SHORTNESS OF BREATH     Pulmonology:  [facility] 2 Failed - 06/30/2022  2:22 PM      Failed - Valid encounter within last 12 months    Recent Outpatient Visits           1 year ago Fever, unspecified fever cause   [facility] Mc[facility]e, Lauren A, NP   1 year ago Fatigue, unspecified type   [facility] Mc[facility]e, Lauren A, NP   1 year ago Persistent asthma without complication, unspecified asthma severity   [facility] Mc[facility]e, Lauren A, NP   1 year ago Frequent UTI   [facility] Mc[facility]e, Lauren A, NP   1 year ago COVID-19   [employer], Darl Householder, DO       Future Appointments             In 1 month Larae Grooms, NP [facility]   In 11 months Sondra Come, MD [facility] [place]            Passed - Last BP in normal range    BP Readings from Last 1 Encounters:  06/24/22 106/71         Passed - Last Heart Rate in normal range    Pulse Readings from Last 1 Encounters:  06/24/22 89

## 2022-07-02 ENCOUNTER — Other Ambulatory Visit: Payer: Self-pay

## 2022-07-02 MED ORDER — AMPHETAMINE-DEXTROAMPHETAMINE 10 MG PO TABS
ORAL_TABLET | ORAL | 0 refills | Status: DC
  Filled 2022-07-02: qty 60, 30d supply, fill #0

## 2022-07-02 MED ORDER — METFORMIN HCL 500 MG PO TABS
ORAL_TABLET | ORAL | 1 refills | Status: DC
  Filled 2022-07-02: qty 60, 30d supply, fill #0

## 2022-07-16 ENCOUNTER — Other Ambulatory Visit: Payer: Self-pay

## 2022-07-21 ENCOUNTER — Other Ambulatory Visit: Payer: Self-pay

## 2022-07-21 MED ORDER — SERTRALINE HCL 100 MG PO TABS
150.0000 mg | ORAL_TABLET | Freq: Every day | ORAL | 1 refills | Status: DC
  Filled 2022-07-21 – 2022-09-15 (×2): qty 135, 90d supply, fill #0

## 2022-07-21 MED ORDER — METFORMIN HCL ER 500 MG PO TB24
1000.0000 mg | ORAL_TABLET | Freq: Every day | ORAL | 1 refills | Status: DC
  Filled 2022-07-21: qty 60, 30d supply, fill #0
  Filled 2022-08-26: qty 60, 30d supply, fill #1

## 2022-07-26 ENCOUNTER — Encounter: Payer: Self-pay | Admitting: Nurse Practitioner

## 2022-07-27 ENCOUNTER — Ambulatory Visit: Payer: Self-pay

## 2022-07-27 NOTE — Telephone Encounter (Signed)
  Chief Complaint: leg pain Symptoms: L leg pain in shin area, 7/10, hurts worse when standing or walking Frequency: 1 week  Pertinent Negatives: Patient denies swelling or redness Disposition: [] ED /[x] Urgent Care (no appt availability in office) / [] Appointment(In office/virtual)/ []  [facility]/ [] Home Care/ [] Refused Recommended Disposition /[] [facility]/ []  Follow-up with PCP Additional Notes: pt had sent mychart message and was advised to schedule appt for leg pain. Offered appt today at 1400 with , NP but pt unable to get off work so soon. States she will try to go to UC this afternoon after work and if unable then will call back to schedule appt.   Reason for Disposition  [1] MODERATE pain (e.g., interferes with normal activities, limping) AND [2] present > 3 days  Answer Assessment - Initial Assessment Questions 1. ONSET: "When did the pain start?"      1 week  2. LOCATION: "Where is the pain located?"      L leg shin area  3. PAIN: "How bad is the pain?"    (Scale 1-10; or mild, moderate, severe)   -  MILD (1-3): doesn't interfere with normal activities    -  MODERATE (4-7): interferes with normal activities (e.g., work or school) or awakens from sleep, limping    -  SEVERE (8-10): excruciating pain, unable to do any normal activities, unable to walk     7/10 6. OTHER SYMPTOMS: "Do you have any other symptoms?" (e.g., chest pain, back pain, breathing difficulty, swelling, rash, fever, numbness, weakness)     Bothersome when walking or standing  Protocols used: Leg Pain-A-AH

## 2022-07-27 NOTE — Telephone Encounter (Signed)
Called to offer a later appointment. I noticed Clydie Braun has a late afternoon appointment. Please offer patient that appointment if it is still available.

## 2022-08-04 ENCOUNTER — Encounter: Payer: Self-pay | Admitting: Urology

## 2022-08-05 ENCOUNTER — Encounter: Payer: No Typology Code available for payment source | Admitting: Nurse Practitioner

## 2022-08-19 ENCOUNTER — Ambulatory Visit
Admission: EM | Admit: 2022-08-19 | Discharge: 2022-08-19 | Disposition: A | Discharge status: Home / Self Care | Payer: 59 | Attending: Urgent Care | Admitting: Urgent Care

## 2022-08-19 DIAGNOSIS — B9689 Other specified bacterial agents as the cause of diseases classified elsewhere: Secondary | ICD-10-CM

## 2022-08-19 DIAGNOSIS — J019 Acute sinusitis, unspecified: Secondary | ICD-10-CM | POA: Diagnosis not present

## 2022-08-19 MED ORDER — AMOXICILLIN-POT CLAVULANATE 875-125 MG PO TABS: 1.0000 | ORAL_TABLET | Freq: Two times a day (BID) | ORAL | 0 refills | Status: AC

## 2022-08-19 NOTE — Discharge Instructions (Signed)
Follow up here or with your primary care provider if your symptoms are worsening or not improving with treatment.     

## 2022-08-19 NOTE — ED Provider Notes (Signed)
Katelyn Whitehead    CSN: [id number] Arrival date & time: 08/19/22  1633      History   Chief Complaint Chief Complaint  Patient presents with   Facial Pain   nasal drainage     HPI Katelyn Whitehead is a 31 y.o. female.   HPI  Presents to urgent care with complaint of sinus pressure nasal drainage x 10 days.  Denies fever, chills, body aches.  Endorses sinus pain and pressure in her face as well as pain and popping in her ears.  She has nasal congestion but no output from her nose.  She does endorse dark green drainage in her throat.  Past Medical History:  Diagnosis Date   ADHD (attention deficit hyperactivity disorder)    Anxiety    Asthma    childhood   COVID-19 virus infection 08/2020   Depression    Kidney stone    Mild mitral and aortic regurgitation    a. 04/2013 Echo: EF 50-55%, mild AI, mild MR, no MVP.   Mitral valve prolapse    Palpitations    a. 2012 Holter: sinus tach, pvc's.   Panic disorder    Urticaria     Patient Active Problem List   Diagnosis Date Noted   Persistent asthma without complication 97/58/8325   Frequent UTI 11/27/2020   Birth control counseling 11/27/2020   Rash 08/20/2020   History of COVID-19 12/03/2019   Mixed obsessional thoughts and acts 09/01/2018   Joint pain 09/21/2017   Mild mitral and aortic regurgitation    Panic disorder    Depression, major, recurrent, moderate ([place]) 12/04/2014    Past Surgical History:  Procedure Laterality Date   CYSTOSCOPY/URETEROSCOPY/HOLMIUM LASER/STENT PLACEMENT Right 02/26/2021   Procedure: CYSTOSCOPY/URETEROSCOPY, RIGHT STENT PLACEMENT;  Surgeon: Billey Co, MD;  Location: [facility];  Service: Urology;  Laterality: Right;   CYSTOSCOPY/URETEROSCOPY/HOLMIUM LASER/STENT PLACEMENT Right 03/20/2021   Procedure: CYSTOSCOPY/URETEROSCOPY/HOLMIUM LASER/STENT EXCHANGE;  Surgeon: Billey Co, MD;  Location: [facility];  Service: Urology;  Laterality: Right;   IUD REMOVAL N/A 11/22/2016    Procedure: LAPAROCOPIC INTRAUTERINE DEVICE (IUD) REMOVAL;  Surgeon: Harlin Heys, MD;  Location: [facility];  Service: Gynecology;  Laterality: N/A;   TYMPANOSTOMY TUBE PLACEMENT     WISDOM TOOTH EXTRACTION      OB History     Gravida  2   Para  1   Term  1   Preterm      AB  1   Living  1      SAB  1   IAB      Ectopic      Multiple  0   Live Births  1            Home Medications    Prior to Admission medications   Medication Sig Start Date End Date Taking? Authorizing Provider  acetaminophen (TYLENOL) 500 MG tablet Take 500 mg by mouth every 6 (six) hours as needed.    [provider]  albuterol (VENTOLIN HFA) 108 (90 Base) MCG/ACT inhaler TAKE 2 PUFFS BY MOUTH EVERY 6 HOURS AS NEEDED FOR WHEEZE OR SHORTNESS OF BREATH 06/30/22   Cannady, Jolene T, NP  amphetamine-dextroamphetamine (ADDERALL) 10 MG tablet Take 1 tablet by mouth Two (2) times a day. 07/02/22     diphenhydrAMINE HCl (BENADRYL ALLERGY PO) Take by mouth as needed.     [provider]  Drospirenone (SLYND) 4 MG TABS Take 4 mg by mouth daily. 10/29/21  Larae Grooms, NP  Drospirenone (SLYND) 4 MG TABS Take 4 mg by mouth once daily 06/25/22     fluocinonide cream (LIDEX) 0.05 % Apply thin layer to affected areas bid prn flares. Do not apply on clear skin. 12/09/21     ibuprofen (ADVIL) 800 MG tablet Take 1 tablet (800 mg total) by mouth every 8 (eight) hours as needed. 12/31/20     lisdexamfetamine (VYVANSE) 40 MG capsule Take 1 capsule (40 mg total) by mouth every morning. 02/19/21     lisdexamfetamine (VYVANSE) 40 MG capsule Take 1 capsule by mouth every morning 06/22/22     LORazepam (ATIVAN) 0.5 MG tablet Take 1/2 tablet by mouth in morning and 1/2 to 1 tablet in evening as needed for high anxiety 04/21/22     metFORMIN (GLUCOPHAGE) 500 MG tablet Take 1/2 tab twice daily x 7 days then take 1 tab twice daily 08/27/21     metFORMIN (GLUCOPHAGE-XR) 500 MG 24 hr tablet Take 2 tablets  (1,000 mg total) by mouth daily before breakfast. 07/21/22     norethindrone (ORTHO MICRONOR) 0.35 MG tablet Take 1 tablet (0.35 mg total) by mouth daily. 01/15/22   Larae Grooms, NP  ondansetron (ZOFRAN ODT) 4 MG disintegrating tablet Take 1 tablet (4 mg total) by mouth every 8 (eight) hours as needed for nausea or vomiting. 06/25/21   McElwee, Lauren A, NP  sertraline (ZOLOFT) 100 MG tablet Take 1.5 tablets (150 mg total) by mouth daily. 07/21/22       Family History Family History  Problem Relation Age of Onset   Depression Mother    Diabetes Mother        type 2   Hyperlipidemia Mother    Hypertension Mother    Melanoma Mother        skin   Asthma Mother    Allergic rhinitis Mother    Drug abuse Father    Gout Father    Depression Father    Psychiatric Illness Father        PTSD   Allergic rhinitis Father    Urticaria Father    Allergic rhinitis Sister    ADD / ADHD Brother    Crohn's disease Daughter    Depression Maternal Aunt    Dementia Maternal Grandmother    Depression Maternal Grandmother    Heart disease Maternal Grandfather    Diabetes Paternal Grandmother        type 2   Hypertension Paternal Grandmother    Stroke Paternal Grandfather     Social History Social History   Tobacco Use   Smoking status: Never    Passive exposure: Never   Smokeless tobacco: Never  Vaping Use   Vaping Use: Never used  Substance Use Topics   Alcohol use: No    Alcohol/week: 0.0 standard drinks of alcohol   Drug use: No     Allergies   Influenza vaccines and Sulfa antibiotics   Review of Systems Review of Systems   Physical Exam Triage Vital Signs ED Triage Vitals  Enc Vitals Group     BP 08/19/22 1710 (!) 138/91     Pulse Rate 08/19/22 1710 (!) 107     Resp 08/19/22 1710 17     Temp 08/19/22 1710 99.1 F (37.3 C)     Temp src --      SpO2 08/19/22 1710 98 %     Weight --      Height --      Head Circumference --  Peak Flow --      Pain Score  08/19/22 1711 7     Pain Loc --      Pain Edu? --      Excl. in [place]? --    No data found.  Updated Vital Signs BP 111/78   Pulse 98   Temp 98.5 F (36.9 C)   Resp 18   LMP  (LMP Unknown)   SpO2 96%   Visual Acuity Right Eye Distance:   Left Eye Distance:   Bilateral Distance:    Right Eye Near:   Left Eye Near:    Bilateral Near:     Physical Exam Vitals reviewed.  Constitutional:      Appearance: Normal appearance.  HENT:     Right Ear: Tympanic membrane normal.     Left Ear: Tympanic membrane normal.     Nose:     Right Sinus: Maxillary sinus tenderness and frontal sinus tenderness present.     Left Sinus: Maxillary sinus tenderness and frontal sinus tenderness present.  Skin:    General: Skin is warm and dry.  Neurological:     General: No focal deficit present.     Mental Status: She is alert and oriented to person, place, and time.  Psychiatric:        Mood and Affect: Mood normal.        Behavior: Behavior normal.      UC Treatments / Results  Labs (all labs ordered are listed, but only abnormal results are displayed) Labs Reviewed - No data to display  EKG   Radiology No results found.  Procedures Procedures (including critical care time)  Medications Ordered in UC Medications - No data to display  Initial Impression / Assessment and Plan / UC Course  I have reviewed the triage vital signs and the nursing notes.  Pertinent labs & imaging results that were available during my care of the patient were reviewed by me and considered in my medical decision making (see chart for details).   Patient is afebrile here without recent antipyretics. Satting well on room air. Overall is well appearing, well hydrated, without respiratory distress.  She has maxillary sinus tenderness bilaterally.  Suspect acute bacterial sinusitis secondary to past viral symptoms.  Will prescribe Augmentin as antibiotic therapy.  Final Clinical Impressions(s) / UC  Diagnoses   Final diagnoses:  None   Discharge Instructions   None    ED Prescriptions   None    PDMP not reviewed this encounter.   Rose Phi, [place] 08/19/22 1730

## 2022-08-19 NOTE — ED Triage Notes (Signed)
Pt. Presents to UC w/ c/o sinus pressure and nasal drainage for the past 10 days.

## 2022-08-26 ENCOUNTER — Other Ambulatory Visit: Payer: Self-pay

## 2022-08-26 MED ORDER — LISDEXAMFETAMINE DIMESYLATE 40 MG PO CAPS
40.0000 mg | ORAL_CAPSULE | Freq: Every morning | ORAL | 0 refills | Status: DC
  Filled 2022-08-26: qty 30, 30d supply, fill #0

## 2022-09-14 ENCOUNTER — Other Ambulatory Visit: Payer: Self-pay

## 2022-09-14 DIAGNOSIS — K130 Diseases of lips: Secondary | ICD-10-CM | POA: Diagnosis not present

## 2022-09-14 DIAGNOSIS — D2271 Melanocytic nevi of right lower limb, including hip: Secondary | ICD-10-CM | POA: Diagnosis not present

## 2022-09-14 DIAGNOSIS — L438 Other lichen planus: Secondary | ICD-10-CM | POA: Diagnosis not present

## 2022-09-14 DIAGNOSIS — L814 Other melanin hyperpigmentation: Secondary | ICD-10-CM | POA: Diagnosis not present

## 2022-09-14 MED ORDER — HYDROCORTISONE 2.5 % EX OINT
1.0000 | TOPICAL_OINTMENT | Freq: Two times a day (BID) | CUTANEOUS | 1 refills | Status: DC | PRN
  Filled 2022-09-14: qty 28.35, 30d supply, fill #0

## 2022-09-14 MED ORDER — FLUOCINONIDE 0.05 % EX CREA
1.0000 | TOPICAL_CREAM | Freq: Two times a day (BID) | CUTANEOUS | 1 refills | Status: DC | PRN
  Filled 2022-09-14: qty 30, 15d supply, fill #0

## 2022-09-15 ENCOUNTER — Other Ambulatory Visit: Payer: Self-pay

## 2022-09-15 MED ORDER — LORAZEPAM 0.5 MG PO TABS
ORAL_TABLET | ORAL | 1 refills | Status: DC
  Filled 2022-09-15: qty 45, 30d supply, fill #0
  Filled 2023-01-20: qty 45, 30d supply, fill #1

## 2022-11-03 ENCOUNTER — Other Ambulatory Visit: Payer: Self-pay

## 2022-11-03 DIAGNOSIS — F411 Generalized anxiety disorder: Secondary | ICD-10-CM | POA: Diagnosis not present

## 2022-11-03 DIAGNOSIS — R635 Abnormal weight gain: Secondary | ICD-10-CM | POA: Diagnosis not present

## 2022-11-03 DIAGNOSIS — F900 Attention-deficit hyperactivity disorder, predominantly inattentive type: Secondary | ICD-10-CM | POA: Diagnosis not present

## 2022-11-03 DIAGNOSIS — F422 Mixed obsessional thoughts and acts: Secondary | ICD-10-CM | POA: Diagnosis not present

## 2022-11-03 DIAGNOSIS — T50905A Adverse effect of unspecified drugs, medicaments and biological substances, initial encounter: Secondary | ICD-10-CM | POA: Diagnosis not present

## 2022-11-03 MED ORDER — AMPHETAMINE-DEXTROAMPHETAMINE 10 MG PO TABS
10.0000 mg | ORAL_TABLET | Freq: Every day | ORAL | 0 refills | Status: DC
  Filled 2022-11-03: qty 30, 30d supply, fill #0

## 2022-11-03 MED ORDER — AMPHETAMINE-DEXTROAMPHETAMINE 10 MG PO TABS: 10.0000 mg | ORAL_TABLET | Freq: Every day | ORAL | 0 refills | Status: DC

## 2022-11-03 MED ORDER — LISDEXAMFETAMINE DIMESYLATE 40 MG PO CAPS: 40.0000 mg | ORAL_CAPSULE | Freq: Every morning | ORAL | 0 refills | Status: DC

## 2022-11-03 MED ORDER — METFORMIN HCL ER 500 MG PO TB24
1000.0000 mg | ORAL_TABLET | Freq: Every day | ORAL | 2 refills | Status: DC
  Filled 2022-11-03: qty 60, 30d supply, fill #0

## 2022-11-03 MED ORDER — LISDEXAMFETAMINE DIMESYLATE 40 MG PO CAPS
40.0000 mg | ORAL_CAPSULE | Freq: Every morning | ORAL | 0 refills | Status: DC
  Filled 2022-11-03: qty 30, 30d supply, fill #0

## 2022-11-05 ENCOUNTER — Other Ambulatory Visit: Payer: Self-pay

## 2022-11-26 ENCOUNTER — Ambulatory Visit
Admission: EM | Admit: 2022-11-26 | Discharge: 2022-11-26 | Disposition: A | Discharge status: Home / Self Care | Payer: 59 | Attending: Emergency Medicine | Admitting: Emergency Medicine

## 2022-11-26 DIAGNOSIS — Z3A01 Less than 8 weeks gestation of pregnancy: Secondary | ICD-10-CM

## 2022-11-26 DIAGNOSIS — H1033 Unspecified acute conjunctivitis, bilateral: Secondary | ICD-10-CM

## 2022-11-26 MED ORDER — POLYMYXIN B-TRIMETHOPRIM 10000-0.1 UNIT/ML-% OP SOLN: 1.0000 [drp] | Freq: Four times a day (QID) | OPHTHALMIC | 0 refills | Status: AC

## 2022-11-26 NOTE — Discharge Instructions (Addendum)
Use the antibiotic eyedrops as prescribed.  Follow up with your primary care provider if your symptoms are not improving.   ? ? ?

## 2022-11-26 NOTE — ED Provider Notes (Signed)
Renaldo Fiddler    CSN: [id number] Arrival date & time: 11/26/22  1720      History   Chief Complaint Chief Complaint  Patient presents with   Sore Throat    Entered by patient    HPI Katelyn Whitehead is a 32 y.o. female.  Patient is [redacted] weeks pregnant.  She presents with 1 week history of sore throat.  She had a fever of 101.4 on 11/23/2022 but none since.  No OTC medications taken today.  Patient also presents with eye irritation and yellowish drainage x 1 day.  She denies eye injury, change in vision, cough, shortness of breath, or other symptoms.  Her medical history includes asthma, mitral valve prolapse, kidney stones, anxiety depression, panic disorder.  The history is provided by the patient and medical records.    Past Medical History:  Diagnosis Date   ADHD (attention deficit hyperactivity disorder)    Anxiety    Asthma    childhood   COVID-19 virus infection 08/2020   Depression    Kidney stone    Mild mitral and aortic regurgitation    a. 04/2013 Echo: EF 50-55%, mild AI, mild MR, no MVP.   Mitral valve prolapse    Palpitations    a. 2012 Holter: sinus tach, pvc's.   Panic disorder    Urticaria     Patient Active Problem List   Diagnosis Date Noted   Persistent asthma without complication 02/24/2021   Frequent UTI 11/27/2020   Birth control counseling 11/27/2020   Rash 08/20/2020   History of COVID-19 12/03/2019   Mixed obsessional thoughts and acts 09/01/2018   Joint pain 09/21/2017   Mild mitral and aortic regurgitation    Panic disorder    Depression, major, recurrent, moderate 12/04/2014    Past Surgical History:  Procedure Laterality Date   CYSTOSCOPY/URETEROSCOPY/HOLMIUM LASER/STENT PLACEMENT Right 02/26/2021   Procedure: CYSTOSCOPY/URETEROSCOPY, RIGHT STENT PLACEMENT;  Surgeon: Sondra Come, MD;  Location: [facility];  Service: Urology;  Laterality: Right;   CYSTOSCOPY/URETEROSCOPY/HOLMIUM LASER/STENT PLACEMENT Right 03/20/2021    Procedure: CYSTOSCOPY/URETEROSCOPY/HOLMIUM LASER/STENT EXCHANGE;  Surgeon: Sondra Come, MD;  Location: [facility];  Service: Urology;  Laterality: Right;   IUD REMOVAL N/A 11/22/2016   Procedure: LAPAROCOPIC INTRAUTERINE DEVICE (IUD) REMOVAL;  Surgeon: Linzie Collin, MD;  Location: [facility];  Service: Gynecology;  Laterality: N/A;   TYMPANOSTOMY TUBE PLACEMENT     WISDOM TOOTH EXTRACTION      OB History     Gravida  3   Para  1   Term  1   Preterm      AB  1   Living  1      SAB  1   IAB      Ectopic      Multiple  0   Live Births  1            Home Medications    Prior to Admission medications   Medication Sig Start Date End Date Taking? Authorizing Provider  trimethoprim-polymyxin b (POLYTRIM) ophthalmic solution Place 1 drop into both eyes 4 (four) times daily for 7 days. 11/26/22 12/03/22 Yes Mickie Bail, NP  acetaminophen (TYLENOL) 500 MG tablet Take 500 mg by mouth every 6 (six) hours as needed.    [provider]  albuterol (VENTOLIN HFA) 108 (90 Base) MCG/ACT inhaler TAKE 2 PUFFS BY MOUTH EVERY 6 HOURS AS NEEDED FOR WHEEZE OR SHORTNESS OF BREATH 06/30/22   Marjie Skiff, NP  amphetamine-dextroamphetamine (ADDERALL) 10 MG tablet Take 1 tablet by mouth Two (2) times a day. 07/02/22     amphetamine-dextroamphetamine (ADDERALL) 10 MG tablet Take 1 tablet (10 mg total) by mouth daily after lunch. 12/27/22     amphetamine-dextroamphetamine (ADDERALL) 10 MG tablet Take 1 tablet (10 mg total) by mouth daily after lunch. 11/29/22     amphetamine-dextroamphetamine (ADDERALL) 10 MG tablet Take 1 tablet (10 mg total) by mouth daily after lunch. 11/03/22     diphenhydrAMINE HCl (BENADRYL ALLERGY PO) Take by mouth as needed.     [provider]  Drospirenone (SLYND) 4 MG TABS Take 4 mg by mouth daily. 10/29/21   Larae Grooms, NP  Drospirenone (SLYND) 4 MG TABS Take 4 mg by mouth once daily 06/25/22     fluocinonide cream (LIDEX) 0.05 % Apply  thin layer to affected areas bid prn flares. Do not apply on clear skin. 12/09/21     fluocinonide cream (LIDEX) 0.05 % Apply 1 Application topically 2 (two) times daily as needed for flares. Do not apply on clear skin. 09/14/22     hydrocortisone 2.5 % ointment Apply 1 Application topically 2 (two) times daily as needed to lips 09/14/22     ibuprofen (ADVIL) 800 MG tablet Take 1 tablet (800 mg total) by mouth every 8 (eight) hours as needed. 12/31/20     lisdexamfetamine (VYVANSE) 40 MG capsule Take 1 capsule (40 mg total) by mouth every morning. 02/19/21     lisdexamfetamine (VYVANSE) 40 MG capsule Take 1 capsule by mouth every morning 08/26/22     lisdexamfetamine (VYVANSE) 40 MG capsule Take 1 capsule (40 mg total) by mouth in the morning. 01/01/23     lisdexamfetamine (VYVANSE) 40 MG capsule Take 1 capsule (40 mg total) by mouth every morning. 12/02/22     lisdexamfetamine (VYVANSE) 40 MG capsule Take 1 capsule (40 mg total) by mouth every morning. 11/03/22     LORazepam (ATIVAN) 0.5 MG tablet Take 0.5 tablets (0.25 mg total) by mouth in the morning AND 0.5-1 tablets (0.25-0.5 mg total) in the evening for high anxiety. 09/15/22     metFORMIN (GLUCOPHAGE) 500 MG tablet Take 1/2 tab twice daily x 7 days then take 1 tab twice daily 08/27/21     metFORMIN (GLUCOPHAGE-XR) 500 MG 24 hr tablet Take 2 tablets (1,000 mg total) by mouth daily before breakfast. 11/03/22     norethindrone (ORTHO MICRONOR) 0.35 MG tablet Take 1 tablet (0.35 mg total) by mouth daily. 01/15/22   Larae Grooms, NP  ondansetron (ZOFRAN ODT) 4 MG disintegrating tablet Take 1 tablet (4 mg total) by mouth every 8 (eight) hours as needed for nausea or vomiting. 06/25/21   McElwee, Lauren A, NP  sertraline (ZOLOFT) 100 MG tablet Take 1.5 tablets (150 mg total) by mouth daily. 07/21/22       Family History Family History  Problem Relation Age of Onset   Depression Mother    Diabetes Mother        type 2   Hyperlipidemia Mother     Hypertension Mother    Melanoma Mother        skin   Asthma Mother    Allergic rhinitis Mother    Drug abuse Father    Gout Father    Depression Father    Psychiatric Illness Father        PTSD   Allergic rhinitis Father    Urticaria Father    Allergic rhinitis Sister    ADD /  ADHD Brother    Crohn's disease Daughter    Depression Maternal Aunt    Dementia Maternal Grandmother    Depression Maternal Grandmother    Heart disease Maternal Grandfather    Diabetes Paternal Grandmother        type 2   Hypertension Paternal Grandmother    Stroke Paternal Grandfather     Social History Social History   Tobacco Use   Smoking status: Never    Passive exposure: Never   Smokeless tobacco: Never  Vaping Use   Vaping Use: Never used  Substance Use Topics   Alcohol use: No    Alcohol/week: 0.0 standard drinks of alcohol   Drug use: No     Allergies   Influenza vaccines and Sulfa antibiotics   Review of Systems Review of Systems  Constitutional:  Positive for fever. Negative for chills.  HENT:  Positive for sore throat. Negative for ear pain.   Eyes:  Positive for discharge, redness and itching. Negative for pain and visual disturbance.  Respiratory:  Negative for cough and shortness of breath.   Cardiovascular:  Negative for chest pain and palpitations.  Gastrointestinal:  Negative for abdominal pain, diarrhea and vomiting.  Skin:  Negative for rash.  All other systems reviewed and are negative.    Physical Exam Triage Vital Signs ED Triage Vitals  Enc Vitals Group     BP --      Pulse Rate 11/26/22 1728 100     Resp 11/26/22 1728 18     Temp 11/26/22 1728 98.4 F (36.9 C)     Temp src --      SpO2 11/26/22 1728 98 %     Weight --      Height --      Head Circumference --      Peak Flow --      Pain Score 11/26/22 1813 3     Pain Loc --      Pain Edu? --      Excl. in [place]? --    No data found.  Updated Vital Signs BP 113/79   Pulse 100   Temp 98.4  F (36.9 C)   Resp 18   LMP 10/22/2022 Comment: Positive home test  SpO2 98%   Visual Acuity Right Eye Distance:   Left Eye Distance:   Bilateral Distance:    Right Eye Near:   Left Eye Near:    Bilateral Near:     Physical Exam Vitals and nursing note reviewed.  Constitutional:      General: She is not in acute distress.    Appearance: Normal appearance. She is well-developed. She is not ill-appearing.  HENT:     Right Ear: Tympanic membrane normal.     Left Ear: Tympanic membrane normal.     Nose: Congestion and rhinorrhea present.     Mouth/Throat:     Mouth: Mucous membranes are moist.     Pharynx: Oropharynx is clear.     Comments: Clear PND.  Eyes:     General: Lids are normal. Vision grossly intact.     Extraocular Movements: Extraocular movements intact.     Conjunctiva/sclera:     Right eye: Right conjunctiva is injected.     Left eye: Left conjunctiva is injected.     Pupils: Pupils are equal, round, and reactive to light.  Cardiovascular:     Rate and Rhythm: Normal rate and regular rhythm.     Heart sounds: Normal heart sounds.  Pulmonary:     Effort: Pulmonary effort is normal. No respiratory distress.     Breath sounds: Normal breath sounds.  Musculoskeletal:     Cervical back: Neck supple.  Skin:    General: Skin is warm and dry.  Neurological:     Mental Status: She is alert.  Psychiatric:        Mood and Affect: Mood normal.        Behavior: Behavior normal.      UC Treatments / Results  Labs (all labs ordered are listed, but only abnormal results are displayed) Labs Reviewed  POCT RAPID STREP A (OFFICE)    EKG   Radiology No results found.  Procedures Procedures (including critical care time)  Medications Ordered in UC Medications - No data to display  Initial Impression / Assessment and Plan / UC Course  I have reviewed the triage vital signs and the nursing notes.  Pertinent labs & imaging results that were available  during my care of the patient were reviewed by me and considered in my medical decision making (see chart for details).    [redacted] weeks pregnant.  Acute bacterial conjunctivitis of both eyes.  Education provided on first trimester pregnancy.  Instructed patient to take OTC medications only as instructed by her OB/GYN.  Treating with Polytrim eyedrops.  Education provided on conjunctivitis.  Instructed patient to follow-up with PCP if symptoms are not improving.  She agrees to plan of care.   Final Clinical Impressions(s) / UC Diagnoses   Final diagnoses:  Less than [redacted] weeks gestation of pregnancy  Acute bacterial conjunctivitis of both eyes     Discharge Instructions      Use the antibiotic eyedrops as prescribed.    Follow-up with your primary care provider if your symptoms are not improving.        ED Prescriptions     Medication Sig Dispense Auth. Provider   trimethoprim-polymyxin b (POLYTRIM) ophthalmic solution Place 1 drop into both eyes 4 (four) times daily for 7 days. 10 mL Mickie Bail, NP      PDMP not reviewed this encounter.   Mickie Bail, NP 11/26/22 1843

## 2022-11-26 NOTE — ED Triage Notes (Addendum)
Patient to Urgent Care with complaints of sore throat/ hoarseness that started over a week ago. Also having eye drainage/ irritation/ some sharp pains. Tuesday fevers of 101.4. reports some improvement in her sore throat.  Reports her daughter was seen at the beginning of the week with same eye symptoms but told she was not contagious.

## 2022-12-15 DIAGNOSIS — N912 Amenorrhea, unspecified: Secondary | ICD-10-CM | POA: Diagnosis not present

## 2022-12-21 DIAGNOSIS — O039 Complete or unspecified spontaneous abortion without complication: Secondary | ICD-10-CM | POA: Diagnosis not present

## 2022-12-21 DIAGNOSIS — O4690 Antepartum hemorrhage, unspecified, unspecified trimester: Secondary | ICD-10-CM | POA: Diagnosis not present

## 2022-12-21 DIAGNOSIS — Z87442 Personal history of urinary calculi: Secondary | ICD-10-CM | POA: Diagnosis not present

## 2022-12-21 DIAGNOSIS — R109 Unspecified abdominal pain: Secondary | ICD-10-CM | POA: Diagnosis not present

## 2022-12-21 DIAGNOSIS — O4691 Antepartum hemorrhage, unspecified, first trimester: Secondary | ICD-10-CM | POA: Diagnosis not present

## 2022-12-21 DIAGNOSIS — O200 Threatened abortion: Secondary | ICD-10-CM | POA: Diagnosis not present

## 2022-12-23 ENCOUNTER — Other Ambulatory Visit: Payer: Self-pay

## 2022-12-23 MED ORDER — AMOXICILLIN-POT CLAVULANATE 875-125 MG PO TABS
ORAL_TABLET | ORAL | 0 refills | Status: DC
  Filled 2022-12-23: qty 10, 5d supply, fill #0

## 2022-12-23 MED ORDER — MISOPROSTOL 200 MCG PO TABS
ORAL_TABLET | ORAL | 0 refills | Status: DC
  Filled 2022-12-23: qty 8, 2d supply, fill #0

## 2022-12-23 MED ORDER — ONDANSETRON 4 MG PO TBDP
ORAL_TABLET | ORAL | 0 refills | Status: DC
  Filled 2022-12-23: qty 20, 7d supply, fill #0

## 2022-12-23 MED ORDER — HYDROCODONE-ACETAMINOPHEN 5-325 MG PO TABS
ORAL_TABLET | ORAL | 0 refills | Status: DC
  Filled 2022-12-23: qty 5, 2d supply, fill #0

## 2022-12-28 DIAGNOSIS — O039 Complete or unspecified spontaneous abortion without complication: Secondary | ICD-10-CM | POA: Diagnosis not present

## 2023-01-07 DIAGNOSIS — O039 Complete or unspecified spontaneous abortion without complication: Secondary | ICD-10-CM | POA: Diagnosis not present

## 2023-01-10 DIAGNOSIS — O039 Complete or unspecified spontaneous abortion without complication: Secondary | ICD-10-CM | POA: Diagnosis not present

## 2023-01-19 ENCOUNTER — Other Ambulatory Visit: Payer: Self-pay

## 2023-01-19 DIAGNOSIS — F900 Attention-deficit hyperactivity disorder, predominantly inattentive type: Secondary | ICD-10-CM | POA: Diagnosis not present

## 2023-01-19 DIAGNOSIS — F422 Mixed obsessional thoughts and acts: Secondary | ICD-10-CM | POA: Diagnosis not present

## 2023-01-19 DIAGNOSIS — F411 Generalized anxiety disorder: Secondary | ICD-10-CM | POA: Diagnosis not present

## 2023-01-19 MED ORDER — AMPHETAMINE-DEXTROAMPHETAMINE 10 MG PO TABS: 10.0000 mg | ORAL_TABLET | Freq: Every morning | ORAL | 0 refills | Status: DC

## 2023-01-19 MED ORDER — AMPHETAMINE-DEXTROAMPHETAMINE 10 MG PO TABS
10.0000 mg | ORAL_TABLET | Freq: Every morning | ORAL | 0 refills | Status: DC
  Filled 2023-01-19: qty 30, 30d supply, fill #0

## 2023-01-19 MED ORDER — METFORMIN HCL ER 500 MG PO TB24
1000.0000 mg | ORAL_TABLET | Freq: Every day | ORAL | 2 refills | Status: DC
  Filled 2023-01-19: qty 60, 30d supply, fill #0

## 2023-01-19 MED ORDER — LISDEXAMFETAMINE DIMESYLATE 40 MG PO CAPS
40.0000 mg | ORAL_CAPSULE | Freq: Every morning | ORAL | 0 refills | Status: DC
  Filled 2023-01-19 – 2023-01-20 (×2): qty 30, 30d supply, fill #0

## 2023-01-19 MED ORDER — LISDEXAMFETAMINE DIMESYLATE 40 MG PO CAPS
40.0000 mg | ORAL_CAPSULE | Freq: Every morning | ORAL | 0 refills | Status: DC
  Filled 2023-03-18: qty 30, 30d supply, fill #0

## 2023-01-19 MED ORDER — LISDEXAMFETAMINE DIMESYLATE 40 MG PO CAPS: 40.0000 mg | ORAL_CAPSULE | Freq: Every morning | ORAL | 0 refills | Status: DC

## 2023-01-19 MED ORDER — SERTRALINE HCL 100 MG PO TABS
150.0000 mg | ORAL_TABLET | Freq: Every day | ORAL | 1 refills | Status: DC
  Filled 2023-01-19: qty 135, 90d supply, fill #0

## 2023-01-20 ENCOUNTER — Other Ambulatory Visit: Payer: Self-pay

## 2023-01-20 ENCOUNTER — Other Ambulatory Visit: Payer: Self-pay | Admitting: Nurse Practitioner

## 2023-01-21 ENCOUNTER — Other Ambulatory Visit: Payer: Self-pay

## 2023-01-21 NOTE — Telephone Encounter (Signed)
Unable to refill per protocol, appointment needed.   Requested Prescriptions  Pending Prescriptions Disp Refills   albuterol (VENTOLIN HFA) 108 (90 Base) MCG/ACT inhaler 6.7 g 2    Sig: TAKE 2 PUFFS BY MOUTH EVERY 6 HOURS AS NEEDED FOR WHEEZE OR SHORTNESS OF BREATH     Pulmonology:  Beta Agonists 2 Failed - 01/20/2023  5:20 PM      Failed - Valid encounter within last 12 months    Recent Outpatient Visits           1 year ago Fever, unspecified fever cause   [place] [facility] McElwee, Lauren A, NP   1 year ago Fatigue, unspecified type   [place] [facility] McElwee, Lauren A, NP   1 year ago Persistent asthma without complication, unspecified asthma severity   [place] [facility] McElwee, Lauren A, NP   2 years ago Frequent UTI   [place] [facility] McElwee, Jake Church, NP   2 years ago COVID-19    [facility] Rumball, Darl Householder, DO       Future Appointments             In 5 months Richardo Hanks, Laurette Schimke, MD [facility] [place]            Passed - Last BP in normal range    BP Readings from Last 1 Encounters:  11/26/22 113/79         Passed - Last Heart Rate in normal range    Pulse Readings from Last 1 Encounters:  11/26/22 100

## 2023-01-25 ENCOUNTER — Other Ambulatory Visit: Payer: Self-pay

## 2023-01-25 ENCOUNTER — Other Ambulatory Visit: Payer: Self-pay | Admitting: Nurse Practitioner

## 2023-01-25 NOTE — Telephone Encounter (Signed)
Refused by office x2- needs appointment Requested Prescriptions  Pending Prescriptions Disp Refills   albuterol (VENTOLIN HFA) 108 (90 Base) MCG/ACT inhaler 6.7 g 2    Sig: TAKE 2 PUFFS BY MOUTH EVERY 6 HOURS AS NEEDED FOR WHEEZE OR SHORTNESS OF BREATH     Pulmonology:  Beta Agonists 2 Failed - 01/25/2023  2:24 AM      Failed - Valid encounter within last 12 months    Recent Outpatient Visits           1 year ago Fever, unspecified fever cause   [place] [facility] McElwee, Lauren A, NP   1 year ago Fatigue, unspecified type   [place] [facility] McElwee, Lauren A, NP   1 year ago Persistent asthma without complication, unspecified asthma severity   [place] [facility] McElwee, Lauren A, NP   2 years ago Frequent UTI    [facility] McElwee, Jake Church, NP   2 years ago COVID-19    [facility] Rumball, Darl Householder, DO       Future Appointments             In 4 months Richardo Hanks, Laurette Schimke, MD [facility] [place]            Passed - Last BP in normal range    BP Readings from Last 1 Encounters:  11/26/22 113/79         Passed - Last Heart Rate in normal range    Pulse Readings from Last 1 Encounters:  11/26/22 100

## 2023-01-26 ENCOUNTER — Other Ambulatory Visit: Payer: Self-pay

## 2023-01-27 ENCOUNTER — Other Ambulatory Visit: Payer: Self-pay

## 2023-01-28 ENCOUNTER — Other Ambulatory Visit: Payer: Self-pay

## 2023-01-30 ENCOUNTER — Other Ambulatory Visit: Payer: Self-pay

## 2023-01-30 MED ORDER — LISDEXAMFETAMINE DIMESYLATE 30 MG PO CAPS
30.0000 mg | ORAL_CAPSULE | Freq: Every morning | ORAL | 0 refills | Status: DC
  Filled 2023-01-30: qty 30, 30d supply, fill #0

## 2023-01-31 ENCOUNTER — Other Ambulatory Visit: Payer: Self-pay

## 2023-02-07 ENCOUNTER — Other Ambulatory Visit: Payer: Self-pay

## 2023-02-24 ENCOUNTER — Other Ambulatory Visit: Payer: Self-pay

## 2023-03-04 ENCOUNTER — Other Ambulatory Visit: Payer: Self-pay

## 2023-03-18 ENCOUNTER — Other Ambulatory Visit: Payer: Self-pay

## 2023-04-01 DIAGNOSIS — R102 Pelvic and perineal pain: Secondary | ICD-10-CM | POA: Diagnosis not present

## 2023-04-01 DIAGNOSIS — Z8759 Personal history of other complications of pregnancy, childbirth and the puerperium: Secondary | ICD-10-CM | POA: Diagnosis not present

## 2023-04-07 DIAGNOSIS — O26899 Other specified pregnancy related conditions, unspecified trimester: Secondary | ICD-10-CM | POA: Diagnosis not present

## 2023-04-07 DIAGNOSIS — Z8759 Personal history of other complications of pregnancy, childbirth and the puerperium: Secondary | ICD-10-CM | POA: Diagnosis not present

## 2023-04-07 DIAGNOSIS — O208 Other hemorrhage in early pregnancy: Secondary | ICD-10-CM | POA: Diagnosis not present

## 2023-04-07 DIAGNOSIS — O99891 Other specified diseases and conditions complicating pregnancy: Secondary | ICD-10-CM | POA: Diagnosis not present

## 2023-04-07 DIAGNOSIS — R102 Pelvic and perineal pain: Secondary | ICD-10-CM | POA: Diagnosis not present

## 2023-04-07 DIAGNOSIS — N390 Urinary tract infection, site not specified: Secondary | ICD-10-CM | POA: Diagnosis not present

## 2023-04-09 ENCOUNTER — Other Ambulatory Visit: Payer: Self-pay

## 2023-04-09 DIAGNOSIS — O208 Other hemorrhage in early pregnancy: Secondary | ICD-10-CM | POA: Diagnosis not present

## 2023-04-09 DIAGNOSIS — Z3A01 Less than 8 weeks gestation of pregnancy: Secondary | ICD-10-CM | POA: Insufficient documentation

## 2023-04-09 DIAGNOSIS — O9A211 Injury, poisoning and certain other consequences of external causes complicating pregnancy, first trimester: Secondary | ICD-10-CM | POA: Insufficient documentation

## 2023-04-09 DIAGNOSIS — O99512 Diseases of the respiratory system complicating pregnancy, second trimester: Secondary | ICD-10-CM | POA: Diagnosis not present

## 2023-04-09 DIAGNOSIS — J45909 Unspecified asthma, uncomplicated: Secondary | ICD-10-CM | POA: Diagnosis not present

## 2023-04-09 DIAGNOSIS — O26891 Other specified pregnancy related conditions, first trimester: Secondary | ICD-10-CM | POA: Insufficient documentation

## 2023-04-09 DIAGNOSIS — R000 Tachycardia, unspecified: Secondary | ICD-10-CM | POA: Diagnosis not present

## 2023-04-09 DIAGNOSIS — M25559 Pain in unspecified hip: Secondary | ICD-10-CM | POA: Diagnosis not present

## 2023-04-09 DIAGNOSIS — R109 Unspecified abdominal pain: Secondary | ICD-10-CM | POA: Insufficient documentation

## 2023-04-09 DIAGNOSIS — S0990XA Unspecified injury of head, initial encounter: Secondary | ICD-10-CM | POA: Diagnosis not present

## 2023-04-09 DIAGNOSIS — R1030 Lower abdominal pain, unspecified: Secondary | ICD-10-CM | POA: Diagnosis not present
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
## 2023-04-10 ENCOUNTER — Emergency Department
Admission: EM | Admit: 2023-04-10 | Discharge: 2023-04-10 | Disposition: A | Discharge status: Home / Self Care | Payer: 59 | Attending: Emergency Medicine | Admitting: Emergency Medicine

## 2023-04-10 ENCOUNTER — Emergency Department: Payer: 59

## 2023-04-10 DIAGNOSIS — O9A211 Injury, poisoning and certain other consequences of external causes complicating pregnancy, first trimester: Secondary | ICD-10-CM | POA: Diagnosis not present

## 2023-04-10 DIAGNOSIS — O26891 Other specified pregnancy related conditions, first trimester: Secondary | ICD-10-CM | POA: Diagnosis not present

## 2023-04-10 DIAGNOSIS — Z3A01 Less than 8 weeks gestation of pregnancy: Secondary | ICD-10-CM | POA: Diagnosis not present

## 2023-04-10 DIAGNOSIS — O208 Other hemorrhage in early pregnancy: Secondary | ICD-10-CM | POA: Diagnosis not present

## 2023-04-10 DIAGNOSIS — S0990XA Unspecified injury of head, initial encounter: Secondary | ICD-10-CM | POA: Diagnosis not present

## 2023-04-10 MED ORDER — ACETAMINOPHEN 500 MG PO TABS
1000.0000 mg | ORAL_TABLET | Freq: Once | ORAL | Status: AC
  Administered 2023-04-10: 1000 mg via ORAL
  Filled 2023-04-10: qty 2

## 2023-04-10 NOTE — Discharge Instructions (Addendum)
You may take Tylenol 1000 mg every 6 hours as needed for pain. °
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
## 2023-04-10 NOTE — ED Notes (Signed)
Pt advised she still has a headache and is having belly pain at this time.

## 2023-04-12 ENCOUNTER — Encounter: Payer: Self-pay | Admitting: Nurse Practitioner

## 2023-04-13 NOTE — Telephone Encounter (Signed)
Pt has an appointment scheduled for 04/14/2023.Marland Kitchen

## 2023-04-14 ENCOUNTER — Other Ambulatory Visit: Payer: Self-pay

## 2023-04-14 ENCOUNTER — Ambulatory Visit (INDEPENDENT_AMBULATORY_CARE_PROVIDER_SITE_OTHER): Payer: 59 | Admitting: Physician Assistant

## 2023-04-14 ENCOUNTER — Telehealth: Payer: Self-pay

## 2023-04-14 VITALS — BP 98/64 | HR 81 | Ht 62.0 in | Wt 166.8 lb

## 2023-04-14 DIAGNOSIS — J00 Acute nasopharyngitis [common cold]: Secondary | ICD-10-CM | POA: Diagnosis not present

## 2023-04-14 DIAGNOSIS — M545 Low back pain, unspecified: Secondary | ICD-10-CM

## 2023-04-14 MED ORDER — ALBUTEROL SULFATE HFA 108 (90 BASE) MCG/ACT IN AERS
INHALATION_SPRAY | RESPIRATORY_TRACT | 2 refills | Status: DC
  Filled 2023-04-14: qty 6.7, 25d supply, fill #0
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
## 2023-04-14 NOTE — Transitions of Care (Post Inpatient/ED Visit) (Signed)
   04/14/2023  Name: Katelyn Whitehead MRN: [id number] DOB: 1991-03-06  Today's TOC FU Call Status: Today's TOC FU Call Status:: Unsuccessful Call (1st Attempt) Unsuccessful Call (1st Attempt) Date: 04/14/23  Attempted to reach the patient regarding the most recent Inpatient/ED visit.  Follow Up Plan: Additional outreach attempts will be made to reach the patient to complete the Transitions of Care (Post Inpatient/ED visit) call.   Signature: Wilhemena Durie, CMA

## 2023-04-14 NOTE — Patient Instructions (Addendum)
  I recommend the following at this time to help relieve that discomfort:  Rest Warm compresses to the area (20 minutes on, minimum of 30 minutes off) You can take up to 1 gram Tylenol every 6 hours as needed for pain  Gentle stretches and exercises that I have included in your paperwork Try to reduce excess strain to the area and rest as much as possible  Wear supportive shoes and, if you must lift anything, use proper lifting techniques that spare your back.   If these measures do not lead to improvement in your symptoms over the next 2-4 weeks please let us know    To help with your sinuses I recommend that you try using Flonase nasal spray once per day  You can use Claritin to help with your allergies and your Inhaler is safe to use as needed for breathing issues

## 2023-04-20 ENCOUNTER — Other Ambulatory Visit: Payer: Self-pay

## 2023-04-20 DIAGNOSIS — O99341 Other mental disorders complicating pregnancy, first trimester: Secondary | ICD-10-CM | POA: Diagnosis not present

## 2023-04-20 DIAGNOSIS — F419 Anxiety disorder, unspecified: Secondary | ICD-10-CM | POA: Diagnosis not present

## 2023-04-20 MED ORDER — LORAZEPAM 0.5 MG PO TABS
ORAL_TABLET | ORAL | 1 refills | Status: AC
  Filled 2023-04-20 – 2023-05-04 (×2): qty 45, 30d supply, fill #0
  Filled 2023-08-29: qty 45, 30d supply, fill #1

## 2023-05-03 ENCOUNTER — Other Ambulatory Visit: Payer: Self-pay

## 2023-05-04 ENCOUNTER — Other Ambulatory Visit: Payer: Self-pay

## 2023-05-05 DIAGNOSIS — O208 Other hemorrhage in early pregnancy: Secondary | ICD-10-CM | POA: Diagnosis not present

## 2023-05-09 ENCOUNTER — Other Ambulatory Visit: Payer: Self-pay

## 2023-05-09 DIAGNOSIS — O99210 Obesity complicating pregnancy, unspecified trimester: Secondary | ICD-10-CM | POA: Diagnosis not present

## 2023-05-09 DIAGNOSIS — Z3481 Encounter for supervision of other normal pregnancy, first trimester: Secondary | ICD-10-CM | POA: Diagnosis not present

## 2023-05-09 LAB — OB RESULTS CONSOLE HEPATITIS B SURFACE ANTIGEN: Hepatitis B Surface Ag: NEGATIVE

## 2023-05-09 LAB — OB RESULTS CONSOLE RUBELLA ANTIBODY, IGM: Rubella: IMMUNE

## 2023-05-09 LAB — OB RESULTS CONSOLE VARICELLA ZOSTER ANTIBODY, IGG: Varicella: NON-IMMUNE/NOT IMMUNE

## 2023-05-09 MED ORDER — FAMOTIDINE 20 MG PO TABS
20.0000 mg | ORAL_TABLET | Freq: Two times a day (BID) | ORAL | 11 refills | Status: AC
  Filled 2023-05-09: qty 60, 30d supply, fill #0
  Filled 2023-08-29: qty 60, 30d supply, fill #1
  Filled 2023-11-09: qty 60, 30d supply, fill #2

## 2023-05-11 ENCOUNTER — Other Ambulatory Visit: Payer: Self-pay | Admitting: Obstetrics

## 2023-05-11 ENCOUNTER — Telehealth: Payer: Self-pay

## 2023-05-11 DIAGNOSIS — I080 Rheumatic disorders of both mitral and aortic valves: Secondary | ICD-10-CM

## 2023-05-16 NOTE — Progress Notes (Deleted)
NO SHOW

## 2023-05-17 ENCOUNTER — Ambulatory Visit: Payer: 59 | Attending: Cardiovascular Disease | Admitting: Cardiovascular Disease

## 2023-06-07 DIAGNOSIS — Z3482 Encounter for supervision of other normal pregnancy, second trimester: Secondary | ICD-10-CM | POA: Diagnosis not present

## 2023-06-07 DIAGNOSIS — O208 Other hemorrhage in early pregnancy: Secondary | ICD-10-CM | POA: Diagnosis not present

## 2023-06-16 DIAGNOSIS — O99210 Obesity complicating pregnancy, unspecified trimester: Secondary | ICD-10-CM | POA: Diagnosis not present

## 2023-06-23 ENCOUNTER — Ambulatory Visit: Payer: Self-pay | Admitting: Urology

## 2023-06-29 ENCOUNTER — Other Ambulatory Visit: Payer: Self-pay

## 2023-06-29 MED ORDER — SERTRALINE HCL 100 MG PO TABS
150.0000 mg | ORAL_TABLET | Freq: Every day | ORAL | 1 refills | Status: DC
  Filled 2023-06-29: qty 135, 90d supply, fill #0

## 2023-07-04 ENCOUNTER — Encounter: Payer: Self-pay | Admitting: *Deleted

## 2023-07-06 ENCOUNTER — Ambulatory Visit: Payer: 59 | Admitting: Obstetrics and Gynecology

## 2023-07-06 ENCOUNTER — Other Ambulatory Visit: Payer: Self-pay | Admitting: *Deleted

## 2023-07-06 ENCOUNTER — Other Ambulatory Visit: Payer: Self-pay

## 2023-07-06 ENCOUNTER — Ambulatory Visit: Payer: 59 | Attending: Obstetrics and Gynecology

## 2023-07-06 DIAGNOSIS — Z3A19 19 weeks gestation of pregnancy: Secondary | ICD-10-CM | POA: Insufficient documentation

## 2023-07-06 DIAGNOSIS — I080 Rheumatic disorders of both mitral and aortic valves: Secondary | ICD-10-CM | POA: Insufficient documentation

## 2023-07-06 DIAGNOSIS — F32A Depression, unspecified: Secondary | ICD-10-CM | POA: Insufficient documentation

## 2023-07-06 DIAGNOSIS — Z79899 Other long term (current) drug therapy: Secondary | ICD-10-CM | POA: Diagnosis not present

## 2023-07-06 DIAGNOSIS — I340 Nonrheumatic mitral (valve) insufficiency: Secondary | ICD-10-CM

## 2023-07-06 DIAGNOSIS — O99512 Diseases of the respiratory system complicating pregnancy, second trimester: Secondary | ICD-10-CM | POA: Diagnosis not present

## 2023-07-06 DIAGNOSIS — O99412 Diseases of the circulatory system complicating pregnancy, second trimester: Secondary | ICD-10-CM

## 2023-07-06 DIAGNOSIS — R002 Palpitations: Secondary | ICD-10-CM | POA: Insufficient documentation

## 2023-07-06 DIAGNOSIS — J4520 Mild intermittent asthma, uncomplicated: Secondary | ICD-10-CM | POA: Insufficient documentation

## 2023-07-06 DIAGNOSIS — Z362 Encounter for other antenatal screening follow-up: Secondary | ICD-10-CM

## 2023-07-06 DIAGNOSIS — Z363 Encounter for antenatal screening for malformations: Secondary | ICD-10-CM | POA: Diagnosis not present

## 2023-07-06 DIAGNOSIS — O99342 Other mental disorders complicating pregnancy, second trimester: Secondary | ICD-10-CM | POA: Diagnosis not present

## 2023-07-06 NOTE — Progress Notes (Signed)
Maternal-Fetal Medicine   Name: Katelyn Whitehead DOB: 15-Aug-1990 MRN: [id number] Referring Provider: Chari Manning, CNM  I had the pleasure of seeing Ms. Mehalic today at [facility], [facility].  She is G4 P1021 at 19w 1d gestation and is here for fetal anatomy scan and consultation.  She was accompanied by her husband.  Past medical history is significant for mitral valve and aortic valve regurgitations. She was in November 2021 at [facility], [facility]. Echocardiography showed normal ejection fraction. Mild mitral and aortic valve regurgitations are seen. No evidence of mitral valve prolapse.  Aortic valve showed indeterminate number of cusps.  Patient does not have chest pain and can tolerate moderate to severe activities. She reports intermittent palpitations that is relieved by lying on her left side. No severe headache or dizziness or visual disturbances. She is not taking beta blockers.  She does not have hypertension or diabetes or any other chronic medical conditions. Patient is being followed by psychiatry for depression.  She takes Zoloft 100 mg daily.  She had discontinued Adderall.  She has mild intermittent asthma.  Past surgical history: Tympanostomy tube placement, cystoscopy, and stent placement. Medications: Prenatal vitamins, Zoloft, albuterol as needed. Allergies: Sulfa drugs (rashes). Social history: Denies tobacco or drug or alcohol use.  She has been married 15 years and her husband is in good health. Family history: No history of venous thromboembolism in the family. GYN history: No history of abnormal Pap smears or cervical surgeries.  Obstetrical history is significant for a term vaginal delivery in 2018 of a female infant weighing 3790 g at birth.  Her daughter has Crohn's disease but otherwise in good health.  Prenatal course: On cell-free fetal DNA screening, the risks of fetal aneuploidies are not increased. MSAFP screening showed low risk for open-neural  tube defects.  Ultrasound We performed fetal anatomical survey.  Amniotic fluid is normal and good fetal activity seen.  Fetal biometry is consistent with the previously established dates.  No markers of aneuploidies or obvious fetal structural defects are seen.  Our concerns include: Mitral valve and aortic valve regurgitations Our patient does not have structural valvular disease (mitral valve prolapse).  In pregnancy, the severity of mitral valve regurgitation decreases because of fall and the systemic vascular resistance.  In some cases increase in plasma volume can worsen the severity of regurgitation.  All patient is able to tolerate moderate activities.  I do not expect any adverse cardiac outcomes in this pregnancy given that she has mild mitral valve regurgitation.  Aortic regurgitations are better tolerated during pregnancy than mitral valve regurgitation.  I recommended a follow-up cardiology consultation.  If patient wishes, she can be seen at cardio-obstetrics clinic at [facility] (Dr. Thomasene Ripple).  Sertraline (Zoloft) No increase in congenital malformations is expected with the use of this medication. It is an SSRI agent. A small absolute increase in primary pulmonary hypertension has been observed. However, untreated depression can be associated with adverse maternal outcomes that can override the above-mentioned risk. Patient was counseled on the small risk for primary pulmonary hypertension.   Sertraline is excreted into breast milk and long-term effects on the infant are not known. The American Academy of Pediatrics classifies this as a drug whose effect on the newborn is unknown but may be of concern.   Recommendations -An appointment was made for her to return in 10 weeks for fetal growth assessment. -Patient may follow-up with her cardiologist or make an appointment with cardio obstetrics Clinic (Dr. Lavona Mound  Tobb).  Thank you for consultation.  If you have any questions or  concerns, please contact me the [facility].  Consultation including face-to-face counseling (more than 50% of time spent) is 35 minutes.

## 2023-07-11 ENCOUNTER — Encounter: Payer: Self-pay | Admitting: Family Medicine

## 2023-07-11 ENCOUNTER — Ambulatory Visit
Admission: RE | Admit: 2023-07-11 | Discharge: 2023-07-11 | Disposition: A | Discharge status: Home / Self Care | Payer: 59 | Attending: Family Medicine | Admitting: Family Medicine

## 2023-07-11 ENCOUNTER — Ambulatory Visit
Admission: RE | Admit: 2023-07-11 | Discharge: 2023-07-11 | Disposition: A | Discharge status: Home / Self Care | Payer: 59 | Source: Ambulatory Visit | Attending: Family Medicine

## 2023-07-11 ENCOUNTER — Ambulatory Visit: Payer: 59 | Admitting: Nurse Practitioner

## 2023-07-11 ENCOUNTER — Ambulatory Visit: Payer: Self-pay

## 2023-07-11 ENCOUNTER — Ambulatory Visit (INDEPENDENT_AMBULATORY_CARE_PROVIDER_SITE_OTHER): Payer: 59 | Admitting: Family Medicine

## 2023-07-11 VITALS — BP 115/77 | HR 105 | Temp 97.9°F | Ht 61.42 in | Wt 172.2 lb

## 2023-07-11 DIAGNOSIS — J069 Acute upper respiratory infection, unspecified: Secondary | ICD-10-CM | POA: Insufficient documentation

## 2023-07-11 DIAGNOSIS — R0602 Shortness of breath: Secondary | ICD-10-CM | POA: Insufficient documentation

## 2023-07-11 DIAGNOSIS — R051 Acute cough: Secondary | ICD-10-CM

## 2023-07-11 DIAGNOSIS — R059 Cough, unspecified: Secondary | ICD-10-CM | POA: Diagnosis not present

## 2023-07-11 DIAGNOSIS — R918 Other nonspecific abnormal finding of lung field: Secondary | ICD-10-CM | POA: Diagnosis not present

## 2023-07-11 HISTORY — DX: Acute upper respiratory infection, unspecified: J06.9

## 2023-07-11 LAB — VERITOR FLU A/B WAIVED
Influenza A: NEGATIVE
Influenza B: NEGATIVE

## 2023-07-11 MED ORDER — ALBUTEROL SULFATE HFA 108 (90 BASE) MCG/ACT IN AERS: INHALATION_SPRAY | RESPIRATORY_TRACT | 2 refills | Status: AC

## 2023-07-11 MED ORDER — AMOXICILLIN-POT CLAVULANATE 875-125 MG PO TABS
1.0000 | ORAL_TABLET | Freq: Two times a day (BID) | ORAL | 0 refills | Status: AC
Start: 2023-07-11 — End: 2023-07-18

## 2023-07-11 NOTE — Patient Instructions (Signed)
heated humidified air Saline nasal spray

## 2023-07-11 NOTE — Telephone Encounter (Signed)
  Chief Complaint: Cough - mild SOB - [redacted] weeks pregnant Symptoms: above Frequency: started feeling poorly Tuesday - cough started Thursday Pertinent Negatives: Patient denies fever Disposition: [] ED /[] Urgent Care (no appt availability in office) / [x] Appointment(In office/virtual)/ []  [facility]/ [] Home Care/ [] Refused Recommended Disposition /[] [facility]/ []  Follow-up with PCP Additional Notes: Pt has near constant cough, and SOB with exertion. Pt has used her inhaler a lot with little relief.  Reason for Disposition  [1] MILD difficulty breathing (e.g., minimal/no SOB at rest, SOB with walking, pulse <100) AND [2] still present when not coughing  Answer Assessment - Initial Assessment Questions 1. ONSET: "When did the cough begin?"     Tuesday 2. SEVERITY: "How bad is the cough today?"      Terrible 3. SPUTUM: "Describe the color of your sputum" (none, dry cough; clear, white, yellow, green)     green 4. HEMOPTYSIS: "Are you coughing up any blood?" If so ask: "How much?" (flecks, streaks, tablespoons, etc.)     no 5. DIFFICULTY BREATHING: "Are you having difficulty breathing?" If Yes, ask: "How bad is it?" (e.g., mild, moderate, severe)    - MILD: No SOB at rest, mild SOB with walking, speaks normally in sentences, can lie down, no retractions, pulse < 100.    - MODERATE: SOB at rest, SOB with minimal exertion and prefers to sit, cannot lie down flat, speaks in phrases, mild retractions, audible wheezing, pulse 100-120.    - SEVERE: Very SOB at rest, speaks in single words, struggling to breathe, sitting hunched forward, retractions, pulse > 120      mild 6. FEVER: "Do you have a fever?" If Yes, ask: "What is your temperature, how was it measured, and when did it start?"     no 10. OTHER SYMPTOMS: "Do you have any other symptoms?" (e.g., runny nose, wheezing, chest pain)       Stopped up nose 11. PREGNANCY: "Is there any chance you are pregnant?" "When was  your last menstrual period?"       Yes 20 weeks.  Protocols used: Cough - Acute Productive-A-AH

## 2023-07-11 NOTE — Progress Notes (Signed)
BP 115/77   Pulse (!) 105   Temp 97.9 F (36.6 C) (Oral)   Ht 5' 1.42" (1.56 m)   Wt 172 lb 3.2 oz (78.1 kg)   LMP 02/22/2023 Comment: Positive home test  SpO2 97%   BMI 32.10 kg/m    Subjective:    Patient ID: Katelyn Whitehead, female    DOB: [birth year], 32 y.o.   MRN: [id number]  HPI: Katelyn Whitehead is a 32 y.o. female  Chief Complaint  Patient presents with   URI   UPPER RESPIRATORY TRACT INFECTION 6 days ago she started to experience a increase of shortness of breath with activity along with other symptoms.  Worst symptom:cough Fever: no Cough: yes productive  Shortness of breath: yes Wheezing: yes Chest pain: no Chest tightness: yes Chest congestion: yes Nasal congestion: no Runny nose: no Post nasal drip: yes Sneezing: yes Sore throat: no Swollen glands: no Sinus pressure: no Headache: Yes; yesterday and Saturday Face pain: no Toothache: no Ear pain: no  Ear pressure: yes "right Eyes red/itching:no Eye drainage/crusting: no  Vomiting: no Rash: no Fatigue: yes Sick contacts: yes Daughter Strep contacts: no  Context: worse Recurrent sinusitis: no Relief with OTC cold/cough medications: no  Treatments attempted:  Nyquil and Dayquil, albuterol inhaler and nebulizer , has had to use inhaler x3 times daily since Thursday  Relevant past medical, surgical, family and social history reviewed and updated as indicated. Interim medical history since our last visit reviewed. Allergies and medications reviewed and updated.  Review of Systems  Constitutional:  Positive for fatigue. Negative for chills and fever.  HENT:  Positive for congestion, rhinorrhea, sneezing and sore throat. Negative for ear pain, postnasal drip, sinus pressure and sinus pain.   Eyes:  Negative for discharge, redness and itching.  Respiratory:  Positive for cough, chest tightness, shortness of breath and wheezing.   Cardiovascular:  Negative for chest pain.  Gastrointestinal:  Negative  for vomiting.  Skin:  Negative for rash.  Neurological:  Positive for headaches.    Per HPI unless specifically indicated above     Objective:    BP 115/77   Pulse (!) 105   Temp 97.9 F (36.6 C) (Oral)   Ht 5' 1.42" (1.56 m)   Wt 172 lb 3.2 oz (78.1 kg)   LMP 02/22/2023 Comment: Positive home test  SpO2 97%   BMI 32.10 kg/m   Wt Readings from Last 3 Encounters:  07/11/23 172 lb 3.2 oz (78.1 kg)  04/14/23 166 lb 12.8 oz (75.7 kg)  06/24/22 155 lb (70.3 kg)    Physical Exam Vitals and nursing note reviewed.  Constitutional:      General: She is not in acute distress.    Appearance: Normal appearance. She is obese. She is not ill-appearing, toxic-appearing or diaphoretic.  HENT:     Head: Normocephalic and atraumatic.     Right Ear: Tympanic membrane, ear canal and external ear normal. There is no impacted cerumen. Tympanic membrane is not erythematous.     Left Ear: Tympanic membrane, ear canal and external ear normal. There is no impacted cerumen. Tympanic membrane is not erythematous.     Nose: Congestion and rhinorrhea present.     Right Turbinates: Swollen.     Left Turbinates: Swollen.     Right Sinus: No maxillary sinus tenderness or frontal sinus tenderness.     Left Sinus: No maxillary sinus tenderness or frontal sinus tenderness.     Mouth/Throat:  Mouth: Mucous membranes are moist.     Pharynx: Oropharynx is clear. Postnasal drip present. No oropharyngeal exudate or posterior oropharyngeal erythema.  Eyes:     General: No scleral icterus.       Right eye: No discharge.        Left eye: No discharge.     Extraocular Movements: Extraocular movements intact.     Conjunctiva/sclera: Conjunctivae normal.     Pupils: Pupils are equal, round, and reactive to light.  Neck:     Vascular: No carotid bruit.  Cardiovascular:     Rate and Rhythm: Regular rhythm. Tachycardia present.     Pulses: Normal pulses.          Radial pulses are 2+ on the right side and 2+  on the left side.       Posterior tibial pulses are 2+ on the right side and 2+ on the left side.     Heart sounds: Normal heart sounds. No murmur heard.    No friction rub. No gallop.  Pulmonary:     Effort: Pulmonary effort is normal. No respiratory distress.     Breath sounds: No stridor. Wheezing and rhonchi present. No rales.  Chest:     Chest wall: No tenderness.  Abdominal:     Comments: Pregnant  Musculoskeletal:        General: Normal range of motion.     Cervical back: Normal range of motion and neck supple. No rigidity. No muscular tenderness.  Lymphadenopathy:     Cervical: No cervical adenopathy.  Skin:    General: Skin is warm and dry.     Capillary Refill: Capillary refill takes less than 2 seconds.     Coloration: Skin is not jaundiced or pale.     Findings: No bruising, erythema, lesion or rash.  Neurological:     General: No focal deficit present.     Mental Status: She is alert and oriented to person, place, and time. Mental status is at baseline.     Cranial Nerves: No cranial nerve deficit.     Sensory: No sensory deficit.     Motor: No weakness.     Coordination: Coordination normal.     Gait: Gait normal.     Deep Tendon Reflexes: Reflexes normal.  Psychiatric:        Mood and Affect: Mood normal.        Behavior: Behavior normal.        Thought Content: Thought content normal.        Judgment: Judgment normal.     Results for orders placed or performed in visit on 07/11/23  Veritor Flu A/B Waived  Result Value Ref Range   Influenza A Negative Negative   Influenza B Negative Negative      Assessment & Plan:   Problem List Items Addressed This Visit     URI, acute - Primary    Acute, ongoing. Flu negative, no equipment to test for COVID. Will obtain STAT chest xray, educated on safety of obtaining this while pregnant, patient verbalized understanding and agrees to obtain xray. Will treat with Augmentin BID for 7 days as symptoms are likely  bacterial due to timeline. Will add azithromycin based on chest xray results. Recommend rest, hydration, humidified air, warm liquids hot tea/soup, saline nasal spray, and teaspoon of honey to help with cough. Return in 1 week, call sooner if concerns arise.       Relevant Orders   Veritor Flu A/B Waived (  Completed)   DG Chest 2 View (Completed)   Other Visit Diagnoses     Shortness of breath       Acute, ongoing. Refill provided for albuterol inhaler q6 PRN. Chest xray obtained. Educated on anatomical changes of pregnancy as can contribute to this.   Relevant Orders   DG Chest 2 View (Completed)        Follow up plan: Return in about 1 week (around 07/18/2023) for Follow up lung recheck, Follow up.

## 2023-07-12 ENCOUNTER — Encounter: Payer: Self-pay | Admitting: Family Medicine

## 2023-07-12 MED ORDER — AZITHROMYCIN 250 MG PO TABS: ORAL_TABLET | ORAL | 0 refills | Status: AC

## 2023-07-12 NOTE — Addendum Note (Signed)
Addended by: Prescott Gum on: 07/12/2023 08:44 AM   Modules accepted: Orders

## 2023-07-12 NOTE — Assessment & Plan Note (Addendum)
Acute, ongoing. Flu negative, no equipment to test for COVID. Will obtain STAT chest xray, educated on safety of obtaining this while pregnant, patient verbalized understanding and agrees to obtain xray. Will treat with Augmentin BID for 7 days as symptoms are likely bacterial due to timeline. Will add azithromycin based on chest xray results. Recommend rest, hydration, humidified air, warm liquids hot tea/soup, saline nasal spray, and teaspoon of honey to help with cough. Return in 1 week, call sooner if concerns arise.

## 2023-07-14 NOTE — Progress Notes (Signed)
Patient notified of results.

## 2023-07-19 ENCOUNTER — Encounter: Payer: Self-pay | Admitting: Family Medicine

## 2023-07-19 ENCOUNTER — Other Ambulatory Visit: Payer: Self-pay

## 2023-07-19 MED ORDER — CLOTRIMAZOLE 3 2 % VA CREA: 1.0000 | TOPICAL_CREAM | Freq: Every day | VAGINAL | 0 refills | Status: DC

## 2023-07-25 DIAGNOSIS — O26852 Spotting complicating pregnancy, second trimester: Secondary | ICD-10-CM | POA: Diagnosis not present

## 2023-07-26 ENCOUNTER — Ambulatory Visit: Payer: 59 | Admitting: Nurse Practitioner

## 2023-08-10 NOTE — L&D Delivery Note (Signed)
 Delivery Note  First Stage: Labor onset: 0716 Augmentation : pitocin Analgesia /Anesthesia intrapartum: epidural SROM 11/17/23 @ 1848  Second Stage: Complete dilation at 1139 Onset of pushing at 1158 FHR second stage Category II, moderate variability, prolonged decelerations/terminal bradycardia to the 80s x 8 min leading to delivery  Due to bradycardia and tight perineum, discussed risks/benefits of episiotomy with patient and her partner to expedite delivery. Patient requested to push one more contraction and delivered spontaneously without need for episiotomy.  Delivery of a viable female infant 11/18/2023 at 1212 by Donato Schultz, CNM. delivery of fetal head in OA position with restitution to LOA. Tight nuchal cord x1;  Anterior then posterior shoulders delivered easily with gentle downward traction. Infant somersaulted with head to maternal right thigh to deliver through tight nuchal cord. Baby then unwrapped from the cord and placed on mom's chest, and attended to by peds.  Cord double clamped after cessation of pulsation, cut by FOB Cord blood sample collected   Third Stage: Placenta delivered Tomasa Blase intact with 3 VC @ 1220 Placenta disposition: discarded Uterine tone firm / bleeding scant  periurethral laceration identified  Anesthesia for repair: epidural Repair 3-0 Vicryl, figure-eight stitch Est. Blood Loss (mL):  Complications: none  Mom to postpartum.  Baby to Couplet care / Skin to Skin.  Newborn: Birth Weight: TBD, infant skin-to-skin  Apgar Scores: 3, 8 Feeding planned: breastfeeding

## 2023-08-16 ENCOUNTER — Ambulatory Visit: Payer: 59 | Admitting: Nurse Practitioner

## 2023-08-16 NOTE — Progress Notes (Deleted)
   LMP 02/22/2023 Comment: Positive home test   Subjective:    Patient ID: Katelyn Whitehead, female    DOB: [birth year], 33 y.o.   MRN: [id number]  HPI: Katelyn Whitehead is a 33 y.o. female  No chief complaint on file.   Relevant past medical, surgical, family and social history reviewed and updated as indicated. Interim medical history since our last visit reviewed. Allergies and medications reviewed and updated.  Review of Systems  Per HPI unless specifically indicated above     Objective:    LMP 02/22/2023 Comment: Positive home test  Wt Readings from Last 3 Encounters:  07/11/23 172 lb 3.2 oz (78.1 kg)  04/14/23 166 lb 12.8 oz (75.7 kg)  06/24/22 155 lb (70.3 kg)    Physical Exam  Results for orders placed or performed in visit on 07/11/23  Veritor Flu A/B Waived   Collection Time: 07/11/23  1:53 PM  Result Value Ref Range   Influenza A Negative Negative   Influenza B Negative Negative      Assessment & Plan:   Problem List Items Addressed This Visit   None    Follow up plan: No follow-ups on file.

## 2023-08-22 ENCOUNTER — Other Ambulatory Visit: Payer: Self-pay

## 2023-08-29 ENCOUNTER — Other Ambulatory Visit: Payer: Self-pay

## 2023-08-30 ENCOUNTER — Other Ambulatory Visit: Payer: Self-pay

## 2023-08-30 DIAGNOSIS — Z23 Encounter for immunization: Secondary | ICD-10-CM | POA: Diagnosis not present

## 2023-08-30 MED ORDER — FLUTICASONE PROPIONATE DISKUS 250 MCG/ACT IN AEPB
1.0000 | INHALATION_SPRAY | Freq: Two times a day (BID) | RESPIRATORY_TRACT | 12 refills | Status: AC
  Filled 2023-08-30: qty 60, 30d supply, fill #0
  Filled 2023-11-11: qty 60, 30d supply, fill #1

## 2023-08-31 ENCOUNTER — Other Ambulatory Visit: Payer: Self-pay

## 2023-08-31 DIAGNOSIS — O26842 Uterine size-date discrepancy, second trimester: Secondary | ICD-10-CM | POA: Diagnosis not present

## 2023-09-06 DIAGNOSIS — O0902 Supervision of pregnancy with history of infertility, second trimester: Secondary | ICD-10-CM | POA: Diagnosis not present

## 2023-09-06 LAB — OB RESULTS CONSOLE HIV ANTIBODY (ROUTINE TESTING): HIV: NONREACTIVE

## 2023-09-12 ENCOUNTER — Ambulatory Visit: Payer: 59 | Attending: Cardiology | Admitting: Cardiology

## 2023-09-12 ENCOUNTER — Ambulatory Visit: Payer: 59

## 2023-09-12 ENCOUNTER — Encounter: Payer: Self-pay | Admitting: Cardiology

## 2023-09-12 VITALS — BP 96/60 | HR 84 | Ht 62.0 in | Wt 185.0 lb

## 2023-09-12 DIAGNOSIS — Z3A29 29 weeks gestation of pregnancy: Secondary | ICD-10-CM | POA: Diagnosis not present

## 2023-09-12 DIAGNOSIS — R002 Palpitations: Secondary | ICD-10-CM

## 2023-09-12 DIAGNOSIS — R600 Localized edema: Secondary | ICD-10-CM

## 2023-09-12 NOTE — Patient Instructions (Addendum)
Medication Instructions:   Your physician recommends that you continue on your current medications as directed. Please refer to the Current Medication list given to you today.  *If you need a refill on your cardiac medications before your next appointment, please call your pharmacy*   Lab Work:  None Ordered  If you have labs (blood work) drawn today and your tests are completely normal, you will receive your results only by: MyChart Message (if you have MyChart) OR A paper copy in the mail If you have any lab test that is abnormal or we need to change your treatment, we will call you to review the results.   Testing/Procedures:  Your physician has requested that you have an echocardiogram. Echocardiography is a painless test that uses sound waves to create images of your heart. It provides your doctor with information about the size and shape of your heart and how well your heart's chambers and valves are working. This procedure takes approximately one hour. There are no restrictions for this procedure. Please do NOT wear cologne, perfume, aftershave, or lotions (deodorant is allowed). Please arrive 15 minutes prior to your appointment time.  Please note: We ask at that you not bring children with you during ultrasound (echo/ vascular) testing. Due to room size and safety concerns, children are not allowed in the ultrasound rooms during exams. Our front office staff cannot provide observation of children in our lobby area while testing is being conducted. An adult accompanying a patient to their appointment will only be allowed in the ultrasound room at the discretion of the ultrasound technician under special circumstances. We apologize for any inconvenience.  Your physician has recommended that you wear a Zio monitor.   This monitor is a medical device that records the heart's electrical activity. Doctors most often use these monitors to diagnose arrhythmias. Arrhythmias are problems  with the speed or rhythm of the heartbeat. The monitor is a small device applied to your chest. You can wear one while you do your normal daily activities. While wearing this monitor if you have any symptoms to push the button and record what you felt. Once you have worn this monitor for the period of time provider prescribed (Usually 14 days), you will return the monitor device in the postage paid box. Once it is returned they will download the data collected and provide [place] with a report which the provider will then review and we will call you with those results. Important tips:  Avoid showering during the first 24 hours of wearing the monitor. Avoid excessive sweating to help maximize wear time. Do not submerge the device, no hot tubs, and no swimming pools. Keep any lotions or oils away from the patch. After 24 hours you may shower with the patch on. Take brief showers with your back facing the shower head.  Do not remove patch once it has been placed because that will interrupt data and decrease adhesive wear time. Push the button when you have any symptoms and write down what you were feeling. Once you have completed wearing your monitor, remove and place into box which has postage paid and place in your outgoing mailbox.  If for some reason you have misplaced your box then call our office and we can provide another box and/or mail it off for you.      Follow-Up: At [facility], you and your health needs are our priority.  As part of our continuing mission to provide you with exceptional heart  care, we have created designated Provider Care Teams.  These Care Teams include your primary Cardiologist (physician) and Advanced Practice Providers (APPs -  Physician Assistants and Nurse Practitioners) who all work together to provide you with the care you need, when you need it.  We recommend signing up for the patient portal called "MyChart".  Sign up information is provided on this After  Visit Summary.  MyChart is used to connect with patients for Virtual Visits (Telemedicine).  Patients are able to view lab/test results, encounter notes, upcoming appointments, etc.  Non-urgent messages can be sent to your provider as well.   To learn more about what you can do with MyChart, go to [email or web address].    Your next appointment:    2 months

## 2023-09-12 NOTE — Progress Notes (Signed)
Cardiology Office Note:    Date:  09/12/2023   ID:  Katelyn Whitehead, DOB 03/13/1991, MRN [id number]  PCP:  Larae Grooms, NP   [facility] Providers Cardiologist:  None     Referring MD: Larae Grooms, NP   Chief Complaint  Patient presents with   New Patient (Initial Visit)    Referred to re-establish cardiac care last seen in 2021.  Patient concerned with worsening palpitations.  Is pregnant with due date on 12/04/2023 and was recommend by high risk pregnancy clinic to have another echo.     Katelyn Whitehead is a 33 y.o. female who is being seen today for the evaluation of palpitations at the request of Larae Grooms, NP.   History of Present Illness:    Katelyn Whitehead is a 33 y.o. female with a hx of anxiety, asthma, currently [redacted] weeks pregnant presenting with palpitations.  States having palpitations most of her adult life, seems to get worse with becoming pregnant.  She is currently [redacted] weeks pregnant, endorses leg edema which improves with raising her legs.  Symptoms of palpitations usually occur for days a week lasting a few seconds.  Denies dizziness or syncope.  Denies chest pain.  Denies any history of heart disease.  Echocardiogram 06/2020 showed normal EF 55 to 60%.  Mild MR.  Past Medical History:  Diagnosis Date   ADHD (attention deficit hyperactivity disorder)    Anxiety    Asthma    childhood   COVID-19 virus infection 08/2020   Depression    Kidney stone    Mild mitral and aortic regurgitation    a. 04/2013 Echo: EF 50-55%, mild AI, mild MR, no MVP.   Mitral valve prolapse    Palpitations    a. 2012 Holter: sinus tach, pvc's.   Panic disorder    URI, acute 07/11/2023   Urticaria     Past Surgical History:  Procedure Laterality Date   CYSTOSCOPY/URETEROSCOPY/HOLMIUM LASER/STENT PLACEMENT Right 02/26/2021   Procedure: CYSTOSCOPY/URETEROSCOPY, RIGHT STENT PLACEMENT;  Surgeon: Sondra Come, MD;  Location: [facility];  Service: Urology;   Laterality: Right;   CYSTOSCOPY/URETEROSCOPY/HOLMIUM LASER/STENT PLACEMENT Right 03/20/2021   Procedure: CYSTOSCOPY/URETEROSCOPY/HOLMIUM LASER/STENT EXCHANGE;  Surgeon: Sondra Come, MD;  Location: [facility];  Service: Urology;  Laterality: Right;   IUD REMOVAL N/A 11/22/2016   Procedure: LAPAROCOPIC INTRAUTERINE DEVICE (IUD) REMOVAL;  Surgeon: Linzie Collin, MD;  Location: [facility];  Service: Gynecology;  Laterality: N/A;   TYMPANOSTOMY TUBE PLACEMENT     WISDOM TOOTH EXTRACTION      Current Medications: Current Meds  Medication Sig   acetaminophen (TYLENOL) 500 MG tablet Take 500 mg by mouth every 6 (six) hours as needed.   albuterol (VENTOLIN HFA) 108 (90 Base) MCG/ACT inhaler INHALE 2 PUFFS BY MOUTH EVERY 6 HOURS AS NEEDED FOR WHEEZE OR SHORTNESS OF BREATH   clotrimazole (CLOTRIMAZOLE 3) 2 % vaginal cream Place 1 Applicatorful vaginally at bedtime. 1 applicator full vaginally at bedtime for 7 days   diphenhydrAMINE HCl (BENADRYL ALLERGY PO) Take by mouth as needed.    famotidine (PEPCID) 20 MG tablet Take 1 tablet (20 mg total) by mouth 2 (two) times daily   Ferrous Sulfate (IRON PO) Take 1 tablet by mouth daily.   fluocinonide cream (LIDEX) 0.05 % Apply thin layer to affected areas bid prn flares. Do not apply on clear skin.   fluocinonide cream (LIDEX) 0.05 % Apply 1 Application topically 2 (two) times daily as needed for flares.  Do not apply on clear skin.   Fluticasone Propionate, Inhal, (FLUTICASONE PROPIONATE DISKUS) 250 MCG/ACT AEPB Inhale 1 puff into the lungs 2 (two) times daily.   hydrocortisone 2.5 % ointment Apply 1 Application topically 2 (two) times daily as needed to lips   LORazepam (ATIVAN) 0.5 MG tablet Take 0.5 tablets (0.25 mg total) by mouth every morning AND 0.5-1 tablets (0.25-0.5 mg total) every evening as needed for high anxiety   sertraline (ZOLOFT) 100 MG tablet Take 1.5 tablets (150 mg total) by mouth daily.     Allergies:   Influenza vaccines and Sulfa  antibiotics   Social History   Socioeconomic History   Marital status: Married    Spouse name: Not on file   Number of children: Not on file   Years of education: Not on file   Highest education level: Associate degree: occupational, Scientist, product/process development, or vocational program  Occupational History   Not on file  Tobacco Use   Smoking status: Never    Passive exposure: Never   Smokeless tobacco: Never  Vaping Use   Vaping status: Never Used  Substance and Sexual Activity   Alcohol use: No    Alcohol/week: 0.0 standard drinks of alcohol   Drug use: No   Sexual activity: Yes    Birth control/protection: Pill  Other Topics Concern   Not on file  Social History Narrative   Not on file   Social Drivers of Health   Financial Resource Strain: Low Risk  (04/14/2023)   Overall Financial Resource Strain (CARDIA)    Difficulty of Paying Living Expenses: Not very hard  Food Insecurity: No Food Insecurity (04/14/2023)   Hunger Vital Sign    Worried About Running Out of Food in the Last Year: Never true    Ran Out of Food in the Last Year: Never true  Transportation Needs: No Transportation Needs (04/14/2023)   PRAPARE - Administrator, Civil Service (Medical): No    Lack of Transportation (Non-Medical): No  Physical Activity: Sufficiently Active (04/14/2023)   Exercise Vital Sign    Days of Exercise per Week: 4 days    Minutes of Exercise per Session: 40 min  Stress: Stress Concern Present (04/14/2023)   [employer] of Occupational Health - Occupational Stress Questionnaire    Feeling of Stress : To some extent  Social Connections: Moderately Integrated (04/14/2023)   Social Connection and Isolation Panel [NHANES]    Frequency of Communication with Friends and Family: Three times a week    Frequency of Social Gatherings with Friends and Family: Twice a week    Attends Religious Services: More than 4 times per year    Active Member of [employer] or Organizations: No    Attends Museum/gallery exhibitions officer: Not on file    Marital Status: Married     Family History: The patient's family history includes ADD / ADHD in her brother; Allergic rhinitis in her father, mother, and sister; Asthma in her mother; Crohn's disease in her daughter; Dementia in her maternal grandmother; Depression in her father, maternal aunt, maternal grandmother, and mother; Diabetes in her mother and paternal grandmother; Drug abuse in her father; Gout in her father; Heart disease in her maternal grandfather; Hyperlipidemia in her mother; Hypertension in her mother and paternal grandmother; Melanoma in her mother; Psychiatric Illness in her father; Stroke in her paternal grandfather; Urticaria in her father.  ROS:   Please see the history of present illness.     All other  systems reviewed and are negative.  EKGs/Labs/Other Studies Reviewed:    The following studies were reviewed today:  EKG Interpretation Date/Time:  Monday September 12 2023 08:57:07 EST Ventricular Rate:  84 PR Interval:  130 QRS Duration:  74 QT Interval:  380 QTC Calculation: 449 R Axis:   87  Text Interpretation: Normal sinus rhythm Normal ECG Confirmed by Debbe Odea ([id number]) on 09/12/2023 9:17:01 AM    Recent Labs: No results found for requested labs within last 365 days.  Recent Lipid Panel    Component Value Date/Time   CHOL 188 06/08/2021 1702   TRIG 67 06/08/2021 1702   HDL 49 06/08/2021 1702   LDLCALC 127 (H) 06/08/2021 1702     Risk Assessment/Calculations:             Physical Exam:    VS:  BP 96/60 (BP Location: Left Arm, Patient Position: Sitting, Cuff Size: Large)   Pulse 84   Ht 5\' 2"  (1.575 m)   Wt 185 lb (83.9 kg)   LMP 02/22/2023 Comment: Positive home test  SpO2 98%   BMI 33.84 kg/m     Wt Readings from Last 3 Encounters:  09/12/23 185 lb (83.9 kg)  07/11/23 172 lb 3.2 oz (78.1 kg)  04/14/23 166 lb 12.8 oz (75.7 kg)     GEN:  Well nourished, well developed in no acute  distress HEENT: Normal NECK: No JVD; No carotid bruits CARDIAC: RRR, no murmurs, rubs, gallops RESPIRATORY:  Clear to auscultation without rales, wheezing or rhonchi  ABDOMEN: Soft, non-tender, non-distended MUSCULOSKELETAL:  No edema; No deformity  SKIN: Warm and dry NEUROLOGIC:  Alert and oriented x 3 PSYCHIATRIC:  Normal affect   ASSESSMENT:    1. Palpitations   2. Bilateral leg edema   3. [redacted] weeks gestation of pregnancy    PLAN:    In order of problems listed above:  Palpitations, likely atrial arrhythmias due to atrial stretching as a result of increased volume with pregnancy.  Evaluate with cardiac monitor to rule out any significant arrhythmias.  Will reassure patient if no arrhythmias noted.  Symptoms typically improve post pregnancy.  Avoid any medications that may pose a threat to baby. Bilateral leg edema, appears dependent, pregnancy contributing.  Obtain echocardiogram. 29 weeks pregnancy, management as per obstetrics.  Follow-up after cardiac testing.     Medication Adjustments/Labs and Tests Ordered: Current medicines are reviewed at length with the patient today.  Concerns regarding medicines are outlined above.  Orders Placed This Encounter  Procedures   LONG TERM MONITOR (3-14 DAYS)   EKG 12-Lead   ECHOCARDIOGRAM COMPLETE   No orders of the defined types were placed in this encounter.   Patient Instructions  Medication Instructions:   Your physician recommends that you continue on your current medications as directed. Please refer to the Current Medication list given to you today.  *If you need a refill on your cardiac medications before your next appointment, please call your pharmacy*   Lab Work:  None Ordered  If you have labs (blood work) drawn today and your tests are completely normal, you will receive your results only by: MyChart Message (if you have MyChart) OR A paper copy in the mail If you have any lab test that is abnormal or we  need to change your treatment, we will call you to review the results.   Testing/Procedures:  Your physician has requested that you have an echocardiogram. Echocardiography is a painless test that uses sound waves to  create images of your heart. It provides your doctor with information about the size and shape of your heart and how well your heart's chambers and valves are working. This procedure takes approximately one hour. There are no restrictions for this procedure. Please do NOT wear cologne, perfume, aftershave, or lotions (deodorant is allowed). Please arrive 15 minutes prior to your appointment time.  Please note: We ask at that you not bring children with you during ultrasound (echo/ vascular) testing. Due to room size and safety concerns, children are not allowed in the ultrasound rooms during exams. Our front office staff cannot provide observation of children in our lobby area while testing is being conducted. An adult accompanying a patient to their appointment will only be allowed in the ultrasound room at the discretion of the ultrasound technician under special circumstances. We apologize for any inconvenience.  Your physician has recommended that you wear a Zio monitor.   This monitor is a medical device that records the heart's electrical activity. Doctors most often use these monitors to diagnose arrhythmias. Arrhythmias are problems with the speed or rhythm of the heartbeat. The monitor is a small device applied to your chest. You can wear one while you do your normal daily activities. While wearing this monitor if you have any symptoms to push the button and record what you felt. Once you have worn this monitor for the period of time provider prescribed (Usually 14 days), you will return the monitor device in the postage paid box. Once it is returned they will download the data collected and provide [place] with a report which the provider will then review and we will call you with  those results. Important tips:  Avoid showering during the first 24 hours of wearing the monitor. Avoid excessive sweating to help maximize wear time. Do not submerge the device, no hot tubs, and no swimming pools. Keep any lotions or oils away from the patch. After 24 hours you may shower with the patch on. Take brief showers with your back facing the shower head.  Do not remove patch once it has been placed because that will interrupt data and decrease adhesive wear time. Push the button when you have any symptoms and write down what you were feeling. Once you have completed wearing your monitor, remove and place into box which has postage paid and place in your outgoing mailbox.  If for some reason you have misplaced your box then call our office and we can provide another box and/or mail it off for you.      Follow-Up: At [facility], you and your health needs are our priority.  As part of our continuing mission to provide you with exceptional heart care, we have created designated Provider Care Teams.  These Care Teams include your primary Cardiologist (physician) and Advanced Practice Providers (APPs -  Physician Assistants and Nurse Practitioners) who all work together to provide you with the care you need, when you need it.  We recommend signing up for the patient portal called "MyChart".  Sign up information is provided on this After Visit Summary.  MyChart is used to connect with patients for Virtual Visits (Telemedicine).  Patients are able to view lab/test results, encounter notes, upcoming appointments, etc.  Non-urgent messages can be sent to your provider as well.   To learn more about what you can do with MyChart, go to [email or web address].    Your next appointment:    2 months  Signed, Debbe Odea, MD  09/12/2023 10:09 AM    [facility]

## 2023-09-13 ENCOUNTER — Observation Stay
Admission: EM | Admit: 2023-09-13 | Discharge: 2023-09-13 | Disposition: A | Discharge status: Home / Self Care | Payer: 59 | Attending: Obstetrics and Gynecology | Admitting: Obstetrics and Gynecology

## 2023-09-13 ENCOUNTER — Encounter: Payer: Self-pay | Admitting: Obstetrics and Gynecology

## 2023-09-13 ENCOUNTER — Other Ambulatory Visit: Payer: Self-pay

## 2023-09-13 ENCOUNTER — Ambulatory Visit: Payer: 59

## 2023-09-13 DIAGNOSIS — Z3A29 29 weeks gestation of pregnancy: Secondary | ICD-10-CM | POA: Diagnosis not present

## 2023-09-13 DIAGNOSIS — O4703 False labor before 37 completed weeks of gestation, third trimester: Secondary | ICD-10-CM | POA: Diagnosis not present

## 2023-09-13 DIAGNOSIS — O479 False labor, unspecified: Principal | ICD-10-CM | POA: Diagnosis present

## 2023-09-13 DIAGNOSIS — R102 Pelvic and perineal pain: Secondary | ICD-10-CM | POA: Diagnosis not present

## 2023-09-13 DIAGNOSIS — O0903 Supervision of pregnancy with history of infertility, third trimester: Secondary | ICD-10-CM | POA: Diagnosis not present

## 2023-09-13 DIAGNOSIS — O2623 Pregnancy care for patient with recurrent pregnancy loss, third trimester: Secondary | ICD-10-CM | POA: Diagnosis not present

## 2023-09-13 LAB — URINALYSIS, COMPLETE (UACMP) WITH MICROSCOPIC
Bacteria, UA: NONE SEEN
Bilirubin Urine: NEGATIVE
Glucose, UA: NEGATIVE mg/dL
Hgb urine dipstick: NEGATIVE
Ketones, ur: NEGATIVE mg/dL
Leukocytes,Ua: NEGATIVE
Nitrite: NEGATIVE
Protein, ur: NEGATIVE mg/dL
RBC / HPF: 0 RBC/hpf (ref 0–5)
Specific Gravity, Urine: 1.009 (ref 1.005–1.030)
pH: 7 (ref 5.0–8.0)

## 2023-09-13 LAB — WET PREP, GENITAL
Clue Cells Wet Prep HPF POC: NONE SEEN
Sperm: NONE SEEN
Trich, Wet Prep: NONE SEEN
WBC, Wet Prep HPF POC: <10 (ref <10)
Yeast Wet Prep HPF POC: NONE SEEN

## 2023-09-13 NOTE — Discharge Summary (Signed)
 Patient ID: Katelyn Whitehead MRN: [id number] DOB/AGE: 35-Aug-1992 35 y.o.  Admit date: 09/13/2023 Discharge date: 09/13/2023  Admission Diagnoses: 33yo G4P1 at [redacted]w[redacted]d presents with contractions starting about 0930 this am and concerns of preterm labor. She describes it as tightening in her suprapubic area.  Her last delivery was VAVD at [redacted]w[redacted]d.  She has a history of 2 first trimester SABs.  She reports that she could be dehydrated and does a lot of house work/cleaning at home.  KC pt.  Discharge Diagnoses: Darol Irving   Factors complicating pregnancy: Hx of miscarriages in 1st trimester x 2 Hx of Mitral and Aortic Regurgitation  H/o mental health diagnoses: Anxiety/Depression/ADHD Obesity BMI: 30.90 Asthma H/o Migraines  Frequent UTIs and hx of kidney stones History of vacuum delivery with G1 Varicella Non Immune Covid in Pregnancy   Prenatal Procedures: NST  Consults: None  Significant Diagnostic Studies:  Results for orders placed or performed during the hospital encounter of 09/13/23 (from the past week)  Wet prep, genital   Collection Time: 09/13/23  1:30 PM   Specimen: Urine, Clean Catch  Result Value Ref Range   Yeast Wet Prep HPF POC NONE SEEN NONE SEEN   Trich, Wet Prep NONE SEEN NONE SEEN   Clue Cells Wet Prep HPF POC NONE SEEN NONE SEEN   WBC, Wet Prep HPF POC <10 <10   Sperm NONE SEEN   Urinalysis, Complete w Microscopic -Urine, Clean Catch   Collection Time: 09/13/23  1:30 PM  Result Value Ref Range   Color, Urine YELLOW (A) YELLOW   APPearance HAZY (A) CLEAR   Specific Gravity, Urine 1.009 1.005 - 1.030   pH 7.0 5.0 - 8.0   Glucose, UA NEGATIVE NEGATIVE mg/dL   Hgb urine dipstick NEGATIVE NEGATIVE   Bilirubin Urine NEGATIVE NEGATIVE   Ketones, ur NEGATIVE NEGATIVE mg/dL   Protein, ur NEGATIVE NEGATIVE mg/dL   Nitrite NEGATIVE NEGATIVE   Leukocytes,Ua NEGATIVE NEGATIVE   RBC / HPF 0 0 - 5 RBC/hpf   WBC, UA 0-5 0 - 5 WBC/hpf   Bacteria, UA NONE SEEN NONE  SEEN   Squamous Epithelial / HPF 0-5 0 - 5 /HPF   Mucus PRESENT     Treatments: none  Hospital Course:  This is a 33 y.o. [id number] with IUP at [redacted]w[redacted]d seen for preterm labor concerns.  No leaking of fluid and no bleeding.  Labs were collected with no significant findings.  Toco was quiet.  RNST.  She was observed, fetal heart rate monitoring remained reassuring, and she had no signs/symptoms of preterm labor or other maternal-fetal concerns.  She was encouraged to increase her water intake and to rest for the next couple of days.  Preterm labor precautions were discussed.  She was deemed stable for discharge to home with outpatient follow up.  Discharge Physical Exam:  BP 109/65 (BP Location: Left Arm)   Pulse 89   Temp 98.2 F (36.8 C) (Oral)   Resp 16   Ht 5' 2 (1.575 m)   Wt 82.6 kg   LMP 02/22/2023 Comment: Positive home test  BMI 33.29 kg/m   General: NAD CV: RRR Pulm: CTABL, nl effort ABD: s/nd/nt, gravid DVT Evaluation: LE non-ttp, no evidence of DVT on exam.  NST: FHR baseline: 145 bpm Variability: moderate Accelerations: yes Decelerations: none Category/reactivity: reactive  TOCO: quiet SVE: deferred      Discharge Condition: Stable  Disposition: Discharge disposition: 01-Home or Self Care        Allergies  as of 09/13/2023       Reactions   Influenza Vaccines Hives   Sulfa Antibiotics    Hives Rash        Medication List     STOP taking these medications    fluocinonide  cream 0.05 % Commonly known as: LIDEX    hydrocortisone  2.5 % ointment       TAKE these medications    acetaminophen  500 MG tablet Commonly known as: TYLENOL  Take 500 mg by mouth every 6 (six) hours as needed.   albuterol  108 (90 Base) MCG/ACT inhaler Commonly known as: VENTOLIN  HFA INHALE 2 PUFFS BY MOUTH EVERY 6 HOURS AS NEEDED FOR WHEEZE OR SHORTNESS OF BREATH   BENADRYL  ALLERGY PO Take by mouth as needed.   Clotrimazole  3 2 % vaginal cream Generic drug:  clotrimazole  Place 1 Applicatorful vaginally at bedtime. 1 applicator full vaginally at bedtime for 7 days   famotidine  20 MG tablet Commonly known as: PEPCID  Take 1 tablet (20 mg total) by mouth 2 (two) times daily   Fluticasone  Propionate Diskus 250 MCG/ACT Aepb Inhale 1 puff into the lungs 2 (two) times daily.   IRON PO Take 1 tablet by mouth daily.   LORazepam  0.5 MG tablet Commonly known as: ATIVAN  Take 0.5 tablets (0.25 mg total) by mouth every morning AND 0.5-1 tablets (0.25-0.5 mg total) every evening as needed for high anxiety   prenatal multivitamin Tabs tablet Take 1 tablet by mouth daily at 12 noon.   sertraline  100 MG tablet Commonly known as: ZOLOFT  Take 1.5 tablets (150 mg total) by mouth daily.         SignedBETHA DELON COE, CNM 09/13/2023 3:25 PM

## 2023-09-13 NOTE — OB Triage Note (Signed)
 Pt reports to labor and delivery with complaints of contractions that are about 5-10 minutes.  She states that they started at 9:30am. They are described as tightening. She has had about 1 bottle of water to drink today. She ate some eggs and gravy biscuit today. States that she's been drinking plenty of fluid. States that her last intercourse was 3 days ago.   Denies any vaginal bleeding or leaking of fluid. States positive fetal movement. EFM and toco applied and assessing.

## 2023-09-13 NOTE — OB Triage Note (Signed)

## 2023-09-14 ENCOUNTER — Ambulatory Visit: Payer: 59 | Attending: Obstetrics and Gynecology

## 2023-09-14 ENCOUNTER — Other Ambulatory Visit: Payer: Self-pay | Admitting: Obstetrics and Gynecology

## 2023-09-14 ENCOUNTER — Other Ambulatory Visit: Payer: Self-pay

## 2023-09-14 DIAGNOSIS — Z362 Encounter for other antenatal screening follow-up: Secondary | ICD-10-CM | POA: Insufficient documentation

## 2023-09-14 DIAGNOSIS — Z3A29 29 weeks gestation of pregnancy: Secondary | ICD-10-CM | POA: Diagnosis not present

## 2023-09-14 DIAGNOSIS — I340 Nonrheumatic mitral (valve) insufficiency: Secondary | ICD-10-CM | POA: Diagnosis not present

## 2023-09-14 DIAGNOSIS — O99412 Diseases of the circulatory system complicating pregnancy, second trimester: Secondary | ICD-10-CM | POA: Diagnosis not present

## 2023-09-14 DIAGNOSIS — O3663X0 Maternal care for excessive fetal growth, third trimester, not applicable or unspecified: Secondary | ICD-10-CM

## 2023-09-21 DIAGNOSIS — F422 Mixed obsessional thoughts and acts: Secondary | ICD-10-CM | POA: Diagnosis not present

## 2023-09-21 DIAGNOSIS — R4589 Other symptoms and signs involving emotional state: Secondary | ICD-10-CM | POA: Diagnosis not present

## 2023-09-30 ENCOUNTER — Other Ambulatory Visit: Payer: Self-pay

## 2023-10-04 DIAGNOSIS — R7309 Other abnormal glucose: Secondary | ICD-10-CM | POA: Diagnosis not present

## 2023-10-05 ENCOUNTER — Other Ambulatory Visit: Payer: Self-pay

## 2023-10-05 MED ORDER — FREESTYLE LANCETS MISC
0 refills | Status: DC
Start: 2023-10-05 — End: 2023-11-04
  Filled 2023-10-05: qty 100, 25d supply, fill #0

## 2023-10-05 MED ORDER — FREESTYLE FREEDOM LITE W/DEVICE KIT
PACK | 0 refills | Status: DC
Start: 2023-10-05 — End: 2023-11-19
  Filled 2023-10-05: qty 1, 30d supply, fill #0

## 2023-10-05 MED ORDER — GLUCOSE BLOOD VI STRP
ORAL_STRIP | 0 refills | Status: DC
Start: 2023-10-05 — End: 2023-11-04
  Filled 2023-10-05: qty 100, 25d supply, fill #0

## 2023-10-07 DIAGNOSIS — R002 Palpitations: Secondary | ICD-10-CM | POA: Diagnosis not present

## 2023-10-07 DIAGNOSIS — R600 Localized edema: Secondary | ICD-10-CM | POA: Diagnosis not present

## 2023-10-12 DIAGNOSIS — R600 Localized edema: Secondary | ICD-10-CM | POA: Diagnosis not present

## 2023-10-12 DIAGNOSIS — R002 Palpitations: Secondary | ICD-10-CM

## 2023-10-13 ENCOUNTER — Ambulatory Visit: Payer: 59 | Attending: Cardiology

## 2023-10-13 DIAGNOSIS — R600 Localized edema: Secondary | ICD-10-CM | POA: Diagnosis not present

## 2023-10-13 DIAGNOSIS — R002 Palpitations: Secondary | ICD-10-CM | POA: Diagnosis not present

## 2023-10-14 LAB — ECHOCARDIOGRAM COMPLETE
AV Mean grad: 6 mmHg
AV Peak grad: 10.6 mmHg
Ao pk vel: 1.63 m/s
Area-P 1/2: 5.54 cm2
P 1/2 time: 453 ms
S' Lateral: 3.2 cm

## 2023-10-24 DIAGNOSIS — N390 Urinary tract infection, site not specified: Secondary | ICD-10-CM | POA: Diagnosis not present

## 2023-10-26 DIAGNOSIS — F411 Generalized anxiety disorder: Secondary | ICD-10-CM | POA: Diagnosis not present

## 2023-10-26 DIAGNOSIS — F422 Mixed obsessional thoughts and acts: Secondary | ICD-10-CM | POA: Diagnosis not present

## 2023-10-26 DIAGNOSIS — F900 Attention-deficit hyperactivity disorder, predominantly inattentive type: Secondary | ICD-10-CM | POA: Diagnosis not present

## 2023-10-26 DIAGNOSIS — Z8659 Personal history of other mental and behavioral disorders: Secondary | ICD-10-CM | POA: Diagnosis not present

## 2023-11-02 ENCOUNTER — Other Ambulatory Visit: Payer: Self-pay

## 2023-11-02 DIAGNOSIS — F411 Generalized anxiety disorder: Secondary | ICD-10-CM | POA: Diagnosis not present

## 2023-11-02 DIAGNOSIS — F422 Mixed obsessional thoughts and acts: Secondary | ICD-10-CM | POA: Diagnosis not present

## 2023-11-02 MED ORDER — SERTRALINE HCL 100 MG PO TABS
150.0000 mg | ORAL_TABLET | Freq: Every day | ORAL | 1 refills | Status: DC
  Filled 2023-11-02: qty 135, 90d supply, fill #0

## 2023-11-02 MED ORDER — SERTRALINE HCL 25 MG PO TABS
25.0000 mg | ORAL_TABLET | Freq: Every day | ORAL | 0 refills | Status: DC
  Filled 2023-11-02: qty 90, 90d supply, fill #0

## 2023-11-04 ENCOUNTER — Observation Stay
Admission: EM | Admit: 2023-11-04 | Discharge: 2023-11-04 | Disposition: A | Discharge status: Home / Self Care | Attending: Obstetrics and Gynecology | Admitting: Obstetrics and Gynecology

## 2023-11-04 ENCOUNTER — Other Ambulatory Visit: Payer: Self-pay

## 2023-11-04 ENCOUNTER — Encounter: Payer: Self-pay | Admitting: Obstetrics and Gynecology

## 2023-11-04 DIAGNOSIS — O1203 Gestational edema, third trimester: Secondary | ICD-10-CM | POA: Insufficient documentation

## 2023-11-04 DIAGNOSIS — O99513 Diseases of the respiratory system complicating pregnancy, third trimester: Secondary | ICD-10-CM | POA: Diagnosis not present

## 2023-11-04 DIAGNOSIS — O163 Unspecified maternal hypertension, third trimester: Secondary | ICD-10-CM | POA: Diagnosis not present

## 2023-11-04 DIAGNOSIS — J45909 Unspecified asthma, uncomplicated: Secondary | ICD-10-CM | POA: Diagnosis not present

## 2023-11-04 DIAGNOSIS — Z3A36 36 weeks gestation of pregnancy: Secondary | ICD-10-CM | POA: Insufficient documentation

## 2023-11-04 DIAGNOSIS — O0993 Supervision of high risk pregnancy, unspecified, third trimester: Secondary | ICD-10-CM | POA: Diagnosis not present

## 2023-11-04 DIAGNOSIS — R030 Elevated blood-pressure reading, without diagnosis of hypertension: Secondary | ICD-10-CM | POA: Diagnosis not present

## 2023-11-04 DIAGNOSIS — Z3490 Encounter for supervision of normal pregnancy, unspecified, unspecified trimester: Principal | ICD-10-CM

## 2023-11-04 DIAGNOSIS — O99891 Other specified diseases and conditions complicating pregnancy: Secondary | ICD-10-CM | POA: Diagnosis not present

## 2023-11-04 LAB — COMPREHENSIVE METABOLIC PANEL WITH GFR
ALT: 24 U/L (ref 0–44)
AST: 26 U/L (ref 15–41)
Albumin: 2.4 g/dL — ABNORMAL LOW (ref 3.5–5.0)
Alkaline Phosphatase: 122 U/L (ref 38–126)
Anion gap: 9 (ref 5–15)
BUN: 8 mg/dL (ref 6–20)
CO2: 25 mmol/L (ref 22–32)
Calcium: 8.5 mg/dL — ABNORMAL LOW (ref 8.9–10.3)
Chloride: 104 mmol/L (ref 98–111)
Creatinine, Ser: 0.6 mg/dL (ref 0.44–1.00)
GFR, Estimated: >60 mL/min (ref >60)
Glucose, Bld: 81 mg/dL (ref 70–99)
Potassium: 3.4 mmol/L — ABNORMAL LOW (ref 3.5–5.1)
Sodium: 138 mmol/L (ref 135–145)
Total Bilirubin: 0.5 mg/dL (ref 0.0–1.2)
Total Protein: 6.1 g/dL — ABNORMAL LOW (ref 6.5–8.1)

## 2023-11-04 LAB — CBC
HCT: 35.4 % — ABNORMAL LOW (ref 36.0–46.0)
Hemoglobin: 12 g/dL (ref 12.0–15.0)
MCH: 28.8 pg (ref 26.0–34.0)
MCHC: 33.9 g/dL (ref 30.0–36.0)
MCV: 84.9 fL (ref 80.0–100.0)
Platelets: 204 10*3/uL (ref 150–400)
RBC: 4.17 MIL/uL (ref 3.87–5.11)
RDW: 14 % (ref 11.5–15.5)
WBC: 7.4 10*3/uL (ref 4.0–10.5)
nRBC: 0 % (ref 0.0–0.2)

## 2023-11-04 LAB — PROTEIN / CREATININE RATIO, URINE
Creatinine, Urine: 89 mg/dL
Protein Creatinine Ratio: 0.18 mg/mg{creat} — ABNORMAL HIGH (ref 0.00–0.15)
Total Protein, Urine: 16 mg/dL

## 2023-11-04 NOTE — Discharge Summary (Signed)
 Patient ID: Katelyn Whitehead MRN: [id number] DOB/AGE: 03/10/1991 33 y.o.  Admit date: 11/04/2023 Discharge date: 11/04/2023  Admission Diagnoses: 33yo G4P1 at [redacted]w[redacted]d presents for elevated home BPs of 150s/100s and general edema.  She reports GFM and denies VB, and UCs.  She denies HA, vision changes, or right epigastric pain.   Discharge Diagnoses: Normotensive, RNST  Factors complicating pregnancy: LGA Hx of Mitral and Aortic Regurgitation  H/o mental health diagnoses: Anxiety/Depression/ADHD Obesity BMI: 30.90 Asthma H/o Migraines  Frequent UTIs History of vacuum delivery with G1 Varicella Non Immune Covid in Pregnancy   Prenatal Procedures: NST  Consults: None  Significant Diagnostic Studies:  Results for orders placed or performed during the hospital encounter of 11/04/23 (from the past week)  CBC   Collection Time: 11/04/23  1:58 PM  Result Value Ref Range   WBC 7.4 4.0 - 10.5 K/uL   RBC 4.17 3.87 - 5.11 MIL/uL   Hemoglobin 12.0 12.0 - 15.0 g/dL   HCT 62.9 (L) 52.8 - 41.3 %   MCV 84.9 80.0 - 100.0 fL   MCH 28.8 26.0 - 34.0 pg   MCHC 33.9 30.0 - 36.0 g/dL   RDW 24.4 01.0 - 27.2 %   Platelets 204 150 - 400 K/uL   nRBC 0.0 0.0 - 0.2 %  Comprehensive metabolic panel   Collection Time: 11/04/23  1:58 PM  Result Value Ref Range   Sodium 138 135 - 145 mmol/L   Potassium 3.4 (L) 3.5 - 5.1 mmol/L   Chloride 104 98 - 111 mmol/L   CO2 25 22 - 32 mmol/L   Glucose, Bld 81 70 - 99 mg/dL   BUN 8 6 - 20 mg/dL   Creatinine, Ser 5.36 0.44 - 1.00 mg/dL   Calcium 8.5 (L) 8.9 - 10.3 mg/dL   Total Protein 6.1 (L) 6.5 - 8.1 g/dL   Albumin 2.4 (L) 3.5 - 5.0 g/dL   AST 26 15 - 41 U/L   ALT 24 0 - 44 U/L   Alkaline Phosphatase 122 38 - 126 U/L   Total Bilirubin 0.5 0.0 - 1.2 mg/dL   GFR, Estimated >64 >40 mL/min   Anion gap 9 5 - 15  Protein / creatinine ratio, urine   Collection Time: 11/04/23  1:58 PM  Result Value Ref Range   Creatinine, Urine 89 mg/dL   Total Protein,  Urine 16 mg/dL   Protein Creatinine Ratio 0.18 (H) 0.00 - 0.15 mg/mg[Cre]    Treatments: none  Hospital Course:  This is a 33 y.o. H4V4259 with IUP at [redacted]w[redacted]d seen for reported elevated BPs at home.  She was observed for 2+ hours, fetal heart rate monitoring remained reassuring, and she had no signs/symptoms of pre-e, preterm labor or other maternal-fetal concerns.   Vitals:   11/04/23 1313 11/04/23 1335 11/04/23 1351 11/04/23 1412  BP: 109/74 115/76 115/69 109/73   11/04/23 1427 11/04/23 1442 11/04/23 1457 11/04/23 1514  BP: 102/72 107/68 106/65 111/77   11/04/23 1520  BP: 106/69  She was deemed stable for discharge to home with outpatient follow up.  Discussed getting a newer BP cuff for her BP monitor.  Discharge Physical Exam:  BP 106/69   Pulse 83   Ht 5\' 2"  (1.575 m)   Wt 86.6 kg   LMP 02/22/2023 Comment: Positive home test  BMI 34.93 kg/m   General: NAD CV: RRR Pulm: nl effort ABD: s/nd/nt, gravid DVT Evaluation: LE non-ttp, no evidence of DVT on exam.  NST: FHR baseline: 145  bpm Variability: moderate Accelerations: yes Decelerations: none Category/reactivity: reactive  TOCO: quiet SVE: deferred      Discharge Condition: Stable  Disposition:  Discharge disposition: 01-Home or Self Care        Allergies as of 11/04/2023       Reactions   Influenza Vaccines Hives   Sulfa Antibiotics    Hives Rash        Medication List     STOP taking these medications    BENADRYL ALLERGY PO   freestyle lancets   FREESTYLE LITE test strip Generic drug: glucose blood       TAKE these medications    acetaminophen 500 MG tablet Commonly known as: TYLENOL Take 500 mg by mouth every 6 (six) hours as needed.   albuterol 108 (90 Base) MCG/ACT inhaler Commonly known as: VENTOLIN HFA INHALE 2 PUFFS BY MOUTH EVERY 6 HOURS AS NEEDED FOR WHEEZE OR SHORTNESS OF BREATH   Clotrimazole 3 2 % vaginal cream Generic drug: clotrimazole Place 1 Applicatorful  vaginally at bedtime. 1 applicator full vaginally at bedtime for 7 days   famotidine 20 MG tablet Commonly known as: PEPCID Take 1 tablet (20 mg total) by mouth 2 (two) times daily   Fluticasone Propionate Diskus 250 MCG/ACT Aepb Inhale 1 puff into the lungs 2 (two) times daily.   FreeStyle Freedom Lite w/Device Kit Use as directed to check blood sugar four times daily.   IRON PO Take 1 tablet by mouth daily.   LORazepam 0.5 MG tablet Commonly known as: ATIVAN Take 0.5 tablets (0.25 mg total) by mouth every morning AND 0.5-1 tablets (0.25-0.5 mg total) every evening as needed for high anxiety   prenatal multivitamin Tabs tablet Take 1 tablet by mouth daily at 12 noon.   sertraline 25 MG tablet Commonly known as: ZOLOFT Take 1 tablet (25 mg total) by mouth daily. What changed: Another medication with the same name was removed. Continue taking this medication, and follow the directions you see here.         SignedHaroldine Laws, CNM 11/04/2023 4:56 PM

## 2023-11-04 NOTE — OB Triage Note (Signed)
 Pt is a 32yo G4P1021, 36w 3d. She arrived to the unit with complaints of high blood pressures at home 150/100s, general edema, and states that her head felt heavy. She denies vaginal bleeding, reports positive fetal movement, and reports no contractions at this time.  VS stable, monitors applied and assessing. Upon assessment: absent clonus, +2 DTR, non-pitting bilateral lower extremity edema Pt denies R epigastric pain, denies vision disturbances Initial FHT 155 at 1310.

## 2023-11-07 DIAGNOSIS — O24419 Gestational diabetes mellitus in pregnancy, unspecified control: Secondary | ICD-10-CM | POA: Insufficient documentation

## 2023-11-07 DIAGNOSIS — O3660X0 Maternal care for excessive fetal growth, unspecified trimester, not applicable or unspecified: Secondary | ICD-10-CM | POA: Insufficient documentation

## 2023-11-08 DIAGNOSIS — O98513 Other viral diseases complicating pregnancy, third trimester: Secondary | ICD-10-CM | POA: Diagnosis not present

## 2023-11-08 DIAGNOSIS — O0903 Supervision of pregnancy with history of infertility, third trimester: Secondary | ICD-10-CM | POA: Diagnosis not present

## 2023-11-08 DIAGNOSIS — U071 COVID-19: Secondary | ICD-10-CM | POA: Diagnosis not present

## 2023-11-08 DIAGNOSIS — O0902 Supervision of pregnancy with history of infertility, second trimester: Secondary | ICD-10-CM | POA: Diagnosis not present

## 2023-11-08 LAB — OB RESULTS CONSOLE GBS: GBS: POSITIVE

## 2023-11-08 LAB — OB RESULTS CONSOLE GC/CHLAMYDIA
Chlamydia: NEGATIVE
Neisseria Gonorrhea: NEGATIVE

## 2023-11-09 ENCOUNTER — Ambulatory Visit: Payer: 59 | Attending: Maternal & Fetal Medicine

## 2023-11-09 ENCOUNTER — Other Ambulatory Visit: Payer: Self-pay

## 2023-11-09 VITALS — BP 127/84 | HR 89

## 2023-11-09 DIAGNOSIS — O99413 Diseases of the circulatory system complicating pregnancy, third trimester: Secondary | ICD-10-CM | POA: Diagnosis not present

## 2023-11-09 DIAGNOSIS — I080 Rheumatic disorders of both mitral and aortic valves: Secondary | ICD-10-CM | POA: Diagnosis not present

## 2023-11-09 DIAGNOSIS — O3663X0 Maternal care for excessive fetal growth, third trimester, not applicable or unspecified: Secondary | ICD-10-CM | POA: Diagnosis not present

## 2023-11-09 DIAGNOSIS — Z3A37 37 weeks gestation of pregnancy: Secondary | ICD-10-CM | POA: Diagnosis not present

## 2023-11-09 DIAGNOSIS — Z362 Encounter for other antenatal screening follow-up: Secondary | ICD-10-CM | POA: Diagnosis not present

## 2023-11-09 DIAGNOSIS — O24419 Gestational diabetes mellitus in pregnancy, unspecified control: Secondary | ICD-10-CM

## 2023-11-11 ENCOUNTER — Ambulatory Visit: Payer: 59 | Attending: Cardiology | Admitting: Cardiology

## 2023-11-11 ENCOUNTER — Other Ambulatory Visit: Payer: Self-pay

## 2023-11-11 NOTE — Progress Notes (Deleted)
 Cardiology Office Note:  .   Date:  11/11/2023  ID:  Whitehead Katelyn, DOB 14-Aug-1990, MRN [id number] PCP: Larae Grooms, NP  [facility] Providers Cardiologist:  None { Click to update primary MD,subspecialty MD or APP then REFRESH:1}   History of Present Illness: Katelyn Whitehead is a 33 y.o. female with a past medical history of anxiety, asthma, and palpitations who presents today for follow-up.  She was last seen in clinic 09/12/2023 after being referred to reestablishing care from her PCP for worsening palpitations.  She had previously been Recommended by high risk pregnancy clinic to have another echocardiogram completed.  States that she been having palpitations most of her adult life.  Seem to get worse after becoming pregnant.  She was [redacted] weeks pregnant and endorses leg edema that improved with raising her legs.  Symptoms of her palpitations usually occur for days a week lasting a few seconds.  She denies any dizziness or syncope or chest pain.  Echocardiogram that was completed in 06/2020 revealed a normal EF of 55 to 60% with mild MR.  Palpitations were evaluated with a heart monitor to rule out any significant any arrhythmias and she was scheduled for an updated echocardiogram.  She returns to clinic today  ROS: 10 point review of system has been reviewed and considered negative exception was been listed in the HPI  Studies Reviewed: .        Cardiac Studies & Procedures   ______________________________________________________________________________________________     ECHOCARDIOGRAM  ECHOCARDIOGRAM COMPLETE 10/13/2023  Narrative ECHOCARDIOGRAM REPORT    Patient Name:   Katelyn Whitehead Date of Exam: 10/13/2023 Medical Rec #:  [id number]       Height:       62.0 in Accession #:    [id number]      Weight:       183.5 lb Date of Birth:  09/26/1990       BSA:          1.843 m Patient Age:    32 years        BP:           108/74 mmHg Patient Gender: F                HR:           88 bpm. Exam Location:  [place]  Procedure: 2D Echo, 3D Echo, Color Doppler, Cardiac Doppler and Strain Analysis (Both Spectral and Color Flow Doppler were utilized during procedure).  Indications:    R60.0 Lower extremity edema  History:        Patient has prior history of Echocardiogram examinations, most recent 06/23/2020. Signs/Symptoms:Edema; Risk Factors:Non-Smoker.  Sonographer:    Ilda Mori MHA, BS, RDCS Referring Phys: [id number] [facility]   Sonographer Comments: [redacted] weeks pregnant IMPRESSIONS   1. Left ventricular ejection fraction, by estimation, is 55 to 60%. Left ventricular ejection fraction by 3D volume is 55 %. The left ventricle has normal function. The left ventricle has no regional wall motion abnormalities. Left ventricular diastolic parameters were normal. The average left ventricular global longitudinal strain is -18.0 %. The global longitudinal strain is normal. 2. Right ventricular systolic function is normal. The right ventricular size is normal. Tricuspid regurgitation signal is inadequate for assessing PA pressure. 3. The mitral valve is normal in structure. Mild mitral valve regurgitation. No evidence of mitral stenosis. 4. The aortic valve is normal in structure. Aortic valve regurgitation is mild to moderate.  No aortic stenosis is present. 5. The inferior vena cava is normal in size with greater than 50% respiratory variability, suggesting right atrial pressure of 3 mmHg.  FINDINGS Left Ventricle: Left ventricular ejection fraction, by estimation, is 55 to 60%. Left ventricular ejection fraction by 3D volume is 55 %. The left ventricle has normal function. The left ventricle has no regional wall motion abnormalities. The average left ventricular global longitudinal strain is -18.0 %. Strain was performed and the global longitudinal strain is normal. The left ventricular internal cavity size was normal in size. There is no left  ventricular hypertrophy. Left ventricular diastolic parameters were normal.  Right Ventricle: The right ventricular size is normal. No increase in right ventricular wall thickness. Right ventricular systolic function is normal. Tricuspid regurgitation signal is inadequate for assessing PA pressure.  Left Atrium: Left atrial size was normal in size.  Right Atrium: Right atrial size was normal in size.  Pericardium: There is no evidence of pericardial effusion.  Mitral Valve: The mitral valve is normal in structure. Mild mitral valve regurgitation. No evidence of mitral valve stenosis.  Tricuspid Valve: The tricuspid valve is normal in structure. Tricuspid valve regurgitation is mild . No evidence of tricuspid stenosis.  Aortic Valve: The aortic valve is normal in structure. Aortic valve regurgitation is mild to moderate. Aortic regurgitation PHT measures 453 msec. No aortic stenosis is present. Aortic valve mean gradient measures 6.0 mmHg. Aortic valve peak gradient measures 10.6 mmHg.  Pulmonic Valve: The pulmonic valve was normal in structure. Pulmonic valve regurgitation is not visualized. No evidence of pulmonic stenosis.  Aorta: The aortic root is normal in size and structure.  Venous: The inferior vena cava is normal in size with greater than 50% respiratory variability, suggesting right atrial pressure of 3 mmHg.  IAS/Shunts: No atrial level shunt detected by color flow Doppler.  Additional Comments: 3D was performed not requiring image post processing on an independent workstation and was normal.   LEFT VENTRICLE PLAX 2D LVIDd:         4.20 cm         Diastology LVIDs:         3.20 cm         LV e' medial:    9.46 cm/s LV PW:         0.70 cm         LV E/e' medial:  9.3 LV IVS:        0.60 cm         LV e' lateral:   16.30 cm/s LV E/e' lateral: 5.4  2D Longitudinal Strain 2D Strain GLS   -18.0 % Avg:  3D Volume EF LV 3D EF:     Left ventricul ar ejection fraction by 3D volume is 55 %.  3D Volume EF: 3D EF:        55 % LV EDV:       133 ml LV ESV:       60 ml LV SV:        73 ml  RIGHT VENTRICLE RV Basal diam:  3.40 cm RV Mid diam:    2.80 cm RV S prime:     14.50 cm/s TAPSE (M-mode): 1.9 cm  LEFT ATRIUM             Index        RIGHT ATRIUM           Index LA diam:        3.60  cm 1.95 cm/m   RA Area:     15.40 cm LA Vol (A2C):   63.9 ml 34.67 ml/m  RA Volume:   37.70 ml  20.46 ml/m LA Vol (A4C):   37.9 ml 20.56 ml/m LA Biplane Vol: 49.3 ml 26.75 ml/m AORTIC VALVE AV Vmax:           163.00 cm/s AV Vmean:          111.000 cm/s AV VTI:            0.296 m AV Peak Grad:      10.6 mmHg AV Mean Grad:      6.0 mmHg LVOT Vmax:         109.00 cm/s LVOT Vmean:        71.800 cm/s LVOT VTI:          0.186 m LVOT/AV VTI ratio: 0.63 AI PHT:            453 msec  AORTA Ao Root diam: 3.00 cm Ao Asc diam:  2.50 cm  MITRAL VALVE MV Area (PHT): 5.54 cm    SHUNTS MV Decel Time: 137 msec    Systemic VTI: 0.19 m MV E velocity: 88.00 cm/s MV A velocity: 77.00 cm/s MV E/A ratio:  1.14  Julien Nordmann MD Electronically signed by Julien Nordmann MD Signature Date/Time: 10/14/2023/12:33:01 PM    Final    MONITORS  LONG TERM MONITOR (3-14 DAYS) 10/10/2023  Narrative Patch Wear Time:  13 days and 23 hours (2025-02-07T08:39:04-499 to 2025-02-21T08:39:08-498)  Patient had a min HR of 64 bpm, max HR of 176 bpm, and avg HR of 101 bpm. Predominant underlying rhythm was Sinus Rhythm. Isolated SVEs were rare (<1.0%), and no SVE Couplets or SVE Triplets were present. Isolated VEs were rare (<1.0%), and no VE Couplets or VE Triplets were present.  Conclusion Average heart rate 101. No atrial fibrillation or atrial flutter. No significant or sustained arrhythmias. Overall benign cardiac monitor.       ______________________________________________________________________________________________       Risk Assessment/Calculations:     No BP recorded.  {Refresh Note OR Click here to enter BP  :1}***       Physical Exam:   VS:  LMP 02/22/2023 Comment: Positive home test   Wt Readings from Last 3 Encounters:  11/04/23 191 lb (86.6 kg)  09/14/23 183 lb 8 oz (83.2 kg)  09/13/23 182 lb (82.6 kg)    GEN: Well nourished, well developed in no acute distress NECK: No JVD; No carotid bruits CARDIAC: ***RRR, no murmurs, rubs, gallops RESPIRATORY:  Clear to auscultation without rales, wheezing or rhonchi  ABDOMEN: Soft, non-tender, non-distended EXTREMITIES:  No edema; No deformity   ASSESSMENT AND PLAN: .   Palpitations Bilateral leg edema Pregnancy    {Are you ordering a CV Procedure (e.g. stress test, cath, DCCV, TEE, etc)?   Press F2        :[id number]}  Dispo: ***  Signed, Jacquelina Hewins, NP

## 2023-11-13 ENCOUNTER — Other Ambulatory Visit: Payer: Self-pay

## 2023-11-13 MED ORDER — SERTRALINE HCL 100 MG PO TABS
150.0000 mg | ORAL_TABLET | Freq: Every day | ORAL | 1 refills | Status: DC
  Filled 2023-11-13: qty 135, 90d supply, fill #0

## 2023-11-14 ENCOUNTER — Other Ambulatory Visit: Payer: Self-pay

## 2023-11-15 DIAGNOSIS — U071 COVID-19: Secondary | ICD-10-CM | POA: Diagnosis not present

## 2023-11-15 DIAGNOSIS — O98513 Other viral diseases complicating pregnancy, third trimester: Secondary | ICD-10-CM | POA: Diagnosis not present

## 2023-11-15 DIAGNOSIS — O0993 Supervision of high risk pregnancy, unspecified, third trimester: Secondary | ICD-10-CM | POA: Diagnosis not present

## 2023-11-15 DIAGNOSIS — O3663X0 Maternal care for excessive fetal growth, third trimester, not applicable or unspecified: Secondary | ICD-10-CM | POA: Diagnosis not present

## 2023-11-17 ENCOUNTER — Encounter: Payer: Self-pay | Admitting: Obstetrics and Gynecology

## 2023-11-17 ENCOUNTER — Inpatient Hospital Stay
Admission: EM | Admit: 2023-11-17 | Discharge: 2023-11-19 | DRG: 806 | Disposition: A | Discharge status: Home / Self Care | Attending: Obstetrics | Admitting: Obstetrics

## 2023-11-17 ENCOUNTER — Other Ambulatory Visit: Payer: Self-pay

## 2023-11-17 DIAGNOSIS — O99344 Other mental disorders complicating childbirth: Secondary | ICD-10-CM | POA: Diagnosis not present

## 2023-11-17 DIAGNOSIS — O4292 Full-term premature rupture of membranes, unspecified as to length of time between rupture and onset of labor: Secondary | ICD-10-CM | POA: Diagnosis not present

## 2023-11-17 DIAGNOSIS — O26893 Other specified pregnancy related conditions, third trimester: Secondary | ICD-10-CM | POA: Diagnosis present

## 2023-11-17 DIAGNOSIS — Z833 Family history of diabetes mellitus: Secondary | ICD-10-CM | POA: Diagnosis not present

## 2023-11-17 DIAGNOSIS — Z8616 Personal history of COVID-19: Secondary | ICD-10-CM

## 2023-11-17 DIAGNOSIS — Z8249 Family history of ischemic heart disease and other diseases of the circulatory system: Secondary | ICD-10-CM | POA: Diagnosis not present

## 2023-11-17 DIAGNOSIS — F331 Major depressive disorder, recurrent, moderate: Secondary | ICD-10-CM | POA: Diagnosis present

## 2023-11-17 DIAGNOSIS — O99214 Obesity complicating childbirth: Secondary | ICD-10-CM | POA: Diagnosis not present

## 2023-11-17 DIAGNOSIS — O3660X0 Maternal care for excessive fetal growth, unspecified trimester, not applicable or unspecified: Secondary | ICD-10-CM | POA: Diagnosis present

## 2023-11-17 DIAGNOSIS — J45909 Unspecified asthma, uncomplicated: Secondary | ICD-10-CM | POA: Diagnosis present

## 2023-11-17 DIAGNOSIS — O3663X0 Maternal care for excessive fetal growth, third trimester, not applicable or unspecified: Secondary | ICD-10-CM | POA: Diagnosis present

## 2023-11-17 DIAGNOSIS — O99824 Streptococcus B carrier state complicating childbirth: Secondary | ICD-10-CM | POA: Diagnosis not present

## 2023-11-17 DIAGNOSIS — Z818 Family history of other mental and behavioral disorders: Secondary | ICD-10-CM | POA: Diagnosis not present

## 2023-11-17 DIAGNOSIS — O0993 Supervision of high risk pregnancy, unspecified, third trimester: Secondary | ICD-10-CM | POA: Diagnosis not present

## 2023-11-17 DIAGNOSIS — Z3A38 38 weeks gestation of pregnancy: Secondary | ICD-10-CM

## 2023-11-17 DIAGNOSIS — Z79899 Other long term (current) drug therapy: Secondary | ICD-10-CM

## 2023-11-17 DIAGNOSIS — O09299 Supervision of pregnancy with other poor reproductive or obstetric history, unspecified trimester: Secondary | ICD-10-CM

## 2023-11-17 DIAGNOSIS — O4290 Premature rupture of membranes, unspecified as to length of time between rupture and onset of labor, unspecified weeks of gestation: Principal | ICD-10-CM | POA: Diagnosis present

## 2023-11-17 DIAGNOSIS — O9952 Diseases of the respiratory system complicating childbirth: Secondary | ICD-10-CM | POA: Diagnosis present

## 2023-11-17 DIAGNOSIS — O99210 Obesity complicating pregnancy, unspecified trimester: Secondary | ICD-10-CM

## 2023-11-17 DIAGNOSIS — B951 Streptococcus, group B, as the cause of diseases classified elsewhere: Secondary | ICD-10-CM

## 2023-11-17 LAB — CBC
HCT: 39.5 % (ref 36.0–46.0)
Hemoglobin: 13.4 g/dL (ref 12.0–15.0)
MCH: 29.3 pg (ref 26.0–34.0)
MCHC: 33.9 g/dL (ref 30.0–36.0)
MCV: 86.2 fL (ref 80.0–100.0)
Platelets: 209 10*3/uL (ref 150–400)
RBC: 4.58 MIL/uL (ref 3.87–5.11)
RDW: 13.9 % (ref 11.5–15.5)
WBC: 7.8 10*3/uL (ref 4.0–10.5)
nRBC: 0 % (ref 0.0–0.2)

## 2023-11-17 LAB — GLUCOSE, CAPILLARY: Glucose-Capillary: 86 mg/dL (ref 70–99)

## 2023-11-17 LAB — TYPE AND SCREEN
ABO/RH(D): O POS
Antibody Screen: NEGATIVE

## 2023-11-17 MED ORDER — PENICILLIN G POT IN DEXTROSE 60000 UNIT/ML IV SOLN
3.0000 10*6.[IU] | INTRAVENOUS | Status: DC
  Administered 2023-11-18 (×3): 3 10*6.[IU] via INTRAVENOUS
  Filled 2023-11-17 (×4): qty 50

## 2023-11-17 MED ORDER — TERBUTALINE SULFATE 1 MG/ML IJ SOLN: 0.2500 mg | Freq: Once | INTRAMUSCULAR | Status: DC | PRN

## 2023-11-17 MED ORDER — LACTATED RINGERS IV SOLN: INTRAVENOUS | Status: DC

## 2023-11-17 MED ORDER — ACETAMINOPHEN 500 MG PO TABS: 1000.0000 mg | ORAL_TABLET | Freq: Four times a day (QID) | ORAL | Status: DC | PRN

## 2023-11-17 MED ORDER — MISOPROSTOL 25 MCG QUARTER TABLET
25.0000 ug | ORAL_TABLET | Freq: Once | ORAL | Status: AC
  Administered 2023-11-17: 25 ug via ORAL
  Filled 2023-11-17: qty 1

## 2023-11-17 MED ORDER — SODIUM CHLORIDE 0.9 % IV SOLN
5.0000 10*6.[IU] | Freq: Once | INTRAVENOUS | Status: AC
  Administered 2023-11-17: 5 10*6.[IU] via INTRAVENOUS
  Filled 2023-11-17: qty 5

## 2023-11-17 MED ORDER — SOD CITRATE-CITRIC ACID 500-334 MG/5ML PO SOLN: 30.0000 mL | ORAL | Status: DC | PRN

## 2023-11-17 MED ORDER — OXYTOCIN BOLUS FROM INFUSION
333.0000 mL | Freq: Once | INTRAVENOUS | Status: AC
  Administered 2023-11-18: 333 mL via INTRAVENOUS

## 2023-11-17 MED ORDER — LACTATED RINGERS IV SOLN: 500.0000 mL | INTRAVENOUS | Status: DC | PRN

## 2023-11-17 MED ORDER — OXYTOCIN 10 UNIT/ML IJ SOLN
INTRAMUSCULAR | Status: AC
  Filled 2023-11-17: qty 2

## 2023-11-17 MED ORDER — ONDANSETRON HCL 4 MG/2ML IJ SOLN: 4.0000 mg | Freq: Four times a day (QID) | INTRAMUSCULAR | Status: DC | PRN

## 2023-11-17 MED ORDER — SODIUM CHLORIDE 0.9% FLUSH: 3.0000 mL | Freq: Two times a day (BID) | INTRAVENOUS | Status: DC

## 2023-11-17 MED ORDER — MISOPROSTOL 200 MCG PO TABS
ORAL_TABLET | ORAL | Status: AC
  Filled 2023-11-17: qty 4

## 2023-11-17 MED ORDER — SODIUM CHLORIDE 0.9 % IV SOLN: 250.0000 mL | INTRAVENOUS | Status: DC | PRN

## 2023-11-17 MED ORDER — LIDOCAINE HCL (PF) 1 % IJ SOLN
30.0000 mL | INTRAMUSCULAR | Status: DC | PRN
  Filled 2023-11-17: qty 30

## 2023-11-17 MED ORDER — MISOPROSTOL 25 MCG QUARTER TABLET
25.0000 ug | ORAL_TABLET | ORAL | Status: DC
  Administered 2023-11-17 – 2023-11-18 (×2): 25 ug via VAGINAL
  Filled 2023-11-17 (×2): qty 1

## 2023-11-17 MED ORDER — FENTANYL CITRATE (PF) 100 MCG/2ML IJ SOLN: 50.0000 ug | INTRAMUSCULAR | Status: DC | PRN

## 2023-11-17 MED ORDER — SODIUM CHLORIDE 0.9% FLUSH: 3.0000 mL | INTRAVENOUS | Status: DC | PRN

## 2023-11-17 MED ORDER — OXYTOCIN-SODIUM CHLORIDE 30-0.9 UT/500ML-% IV SOLN
2.5000 [IU]/h | INTRAVENOUS | Status: DC
  Filled 2023-11-17: qty 500

## 2023-11-17 MED ORDER — AMMONIA AROMATIC IN INHA
RESPIRATORY_TRACT | Status: AC
  Filled 2023-11-17: qty 10

## 2023-11-17 NOTE — H&P (Signed)
 OB History & Physical   History of Present Illness:   Chief Complaint: Leakage of amniotic fluid  HPI:  Katelyn Whitehead is a 33 y.o. [phone] female at [redacted]w[redacted]d, Patient's last menstrual period was 02/22/2023., consistent with [place] at [redacted]w[redacted]d, with Estimated Date of Delivery: 11/29/23.  She presents to L&D for rupture of membranes. Describes fluid to be clear. She is GBS positive.   Reports active fetal movement  Contractions: irregular  LOF/SROM: SROM @ 1848 on 04/10, per patient  Vaginal bleeding: None  Factors complicating pregnancy:  LGA History of Anxiety/Depression/ADHD Obesity BMI: 30.90 Asthma History of migraines Frequent UTIs History of kidney stones Varicella non immune  GBS positive  Patient Active Problem List   Diagnosis Date Noted   Leakage of amniotic fluid 11/17/2023   Gestational diabetes mellitus (GDM) in third trimester 11/07/2023   LGA (large for gestational age) fetus affecting management of mother 11/07/2023   Pregnancy 11/04/2023   Uterine contractions at greater than 20 weeks of gestation 09/13/2023   Persistent asthma without complication 02/24/2021   Mild mitral and aortic regurgitation    Depression, major, recurrent, moderate (HCC) 12/04/2014   Palpitations 02/24/2011    Prenatal Transfer Tool  Maternal Diabetes: No Genetic Screening: Normal Maternal Ultrasounds/Referrals: Normal Fetal Ultrasounds or other Referrals:  None Maternal Substance Abuse:  No Significant Maternal Medications:  Meds include: Zoloft-150 mg Significant Maternal Lab Results: Group B Strep positive  Maternal Medical History:   Past Medical History:  Diagnosis Date   ADHD (attention deficit hyperactivity disorder)    Anxiety    Asthma    childhood   COVID-19 virus infection 08/2020   Depression    Kidney stone    Mild mitral and aortic regurgitation    a. 04/2013 Echo: EF 50-55%, mild AI, mild MR, no MVP.   Mitral valve prolapse    Palpitations    a. 2012 Holter:  sinus tach, pvc's.   Panic disorder    URI, acute 07/11/2023   Urticaria     Past Surgical History:  Procedure Laterality Date   CYSTOSCOPY/URETEROSCOPY/HOLMIUM LASER/STENT PLACEMENT Right 02/26/2021   Procedure: CYSTOSCOPY/URETEROSCOPY, RIGHT STENT PLACEMENT;  Surgeon: Sondra Come, MD;  Location: [facility];  Service: Urology;  Laterality: Right;   CYSTOSCOPY/URETEROSCOPY/HOLMIUM LASER/STENT PLACEMENT Right 03/20/2021   Procedure: CYSTOSCOPY/URETEROSCOPY/HOLMIUM LASER/STENT EXCHANGE;  Surgeon: Sondra Come, MD;  Location: [facility] OR[facility];  Service: Urology;  Laterality: Right;   IUD REMOVAL N/A 11/22/2016   Procedure: LAPAROCOPIC INTRAUTERINE DEVICE (IUD) REMOVAL;  Surgeon: Linzie Collin, MD;  Location: [facility];  Service: Gynecology;  Laterality: N/A;   TYMPANOSTOMY TUBE PLACEMENT     WISDOM TOOTH EXTRACTION      Allergies  Allergen Reactions   Influenza Vaccines Hives   Sulfa Antibiotics     Hives Rash    Prior to Admission medications   Medication Sig Start Date End Date Taking? Authorizing Provider  albuterol (VENTOLIN HFA) 108 (90 Base) MCG/ACT inhaler INHALE 2 PUFFS BY MOUTH EVERY 6 HOURS AS NEEDED FOR WHEEZE OR SHORTNESS OF BREATH 07/11/23  Yes Pearley, Sherran Needs, NP  famotidine (PEPCID) 20 MG tablet Take 1 tablet (20 mg total) by mouth 2 (two) times daily 05/09/23  Yes   LORazepam (ATIVAN) 0.5 MG tablet Take 0.5 tablets (0.25 mg total) by mouth every morning AND 0.5-1 tablets (0.25-0.5 mg total) every evening as needed for high anxiety 04/20/23  Yes   Prenatal Vit-Fe Fumarate-FA (PRENATAL MULTIVITAMIN) TABS tablet Take 1 tablet by mouth daily at 12  noon.   Yes [provider]  sertraline (ZOLOFT) 100 MG tablet Take 1.5 tablets (150 mg total) by mouth daily. 11/13/23  Yes   sertraline (ZOLOFT) 25 MG tablet Take 1 tablet (25 mg total) by mouth daily. 11/02/23  Yes   acetaminophen (TYLENOL) 500 MG tablet Take 500 mg by mouth every 6 (six) hours as needed. Patient not  taking: Reported on 11/17/2023    [provider]  Blood Glucose Monitoring Suppl (FREESTYLE FREEDOM LITE) w/Device KIT Use as directed to check blood sugar four times daily. 10/05/23     clotrimazole (CLOTRIMAZOLE 3) 2 % vaginal cream Place 1 Applicatorful vaginally at bedtime. 1 applicator full vaginally at bedtime for 7 days 07/19/23   Larae Grooms, NP  Ferrous Sulfate (IRON PO) Take 1 tablet by mouth daily. Patient not taking: Reported on 11/09/2023    [provider]  Fluticasone Propionate, Inhal, (FLUTICASONE PROPIONATE DISKUS) 250 MCG/ACT AEPB Inhale 1 puff into the lungs 2 (two) times daily. 08/30/23        Pren[facility] care site:  [facility]  OB History  Gravida Para Term Preterm AB Living  4 1 1  0 2 1  SAB IAB Ectopic Multiple Live Births  2 0 0 0 1    # Outcome Date GA Lbr Len/2nd Weight Sex Type Anes PTL Lv  4 Current           3 Term 09/10/16 [redacted]w[redacted]d / 04:52 3790 g F Vag-Vacuum EPI  LIV     Name: Diveley,GIRL Chantia     Apgar1: 6  Apgar5: 8  2 SAB 12/09/15 [redacted]w[redacted]d      N FD  1 SAB  [redacted]w[redacted]d            Social History: She  reports that she has never smoked. She has never been exposed to tobacco smoke. She has never used smokeless tobacco. She reports that she does not drink alcohol and does not use drugs.  Family History: family history includes ADD / ADHD in her brother; Allergic rhinitis in her father, mother, and sister; Asthma in her mother; Crohn's disease in her daughter; Dementia in her maternal grandmother; Depression in her father, maternal aunt, maternal grandmother, and mother; Diabetes in her mother and paternal grandmother; Drug abuse in her father; Gout in her father; Heart disease in her maternal grandfather; Hyperlipidemia in her mother; Hypertension in her mother and paternal grandmother; Melanoma in her mother; Psychiatric Illness in her father; Stroke in her paternal grandfather; Urticaria in her father.   Review of Systems:  Review of  Systems  Constitutional: Negative.   Respiratory: Negative.    Cardiovascular: Negative.   Gastrointestinal: Negative.   Genitourinary: Negative.   Psychiatric/Behavioral: Negative.      Physical Exam:  Vital Signs: LMP 02/22/2023 Comment: Positive home test  General: no acute distress.  HEENT: normocephalic, atraumatic Heart: regular rate & rhythm Lungs: normal respiratory effort Abdomen: soft, gravid, non-tende Pelvic:   External: Normal external female genitalia  Cervix: Dilation: 1.5 / Effacement (%): Thick / Station: Ballotable    Extremities: non-tender, symmetric, Mild edema bilaterally.  DTRs: 2+  Neurologic: Alert & oriented x 3.    No results found for this or any previous visit (from the past 24 hours).  Pertinent Results:  Prenatal Labs: Blood type/Rh O POS   Antibody screen Negative    Rubella Immune     Varicella Non Immune  RPR Non Reactive    HBsAg Negative   Hep C  Non Reactive   HIV Negative    GC Negative  Chlamydia Negative  Genetic screening cfDNA negative   1 hour GTT 153  3 hour GTT N/A  GBS Positive     FHT:  FHR: 155 bpm, variability: moderate,  accelerations:  Present,  decelerations:  Absent Category/reactivity:  Category I UC:   irregular; toco to be adjusted    Cephalic by BSUS   Assessment:  EBUNOLUWA GERNERT is a 33 y.o. G1P1021 female at [redacted]w[redacted]d admitted for rupture of membranes.   Plan:  1. Admit to Labor & Delivery - Consents reviewed and obtained - S.D.Jean Rosenthal, MD notified of admission and plan of care   2. Fetal Well being  - Fetal Tracing: I - Group B Streptococcus ppx  indicated: GBS positive  - Presentation: Cephalic confirmed by BSUS   3. Routine OB: - Prenatal labs reviewed, as above - Rh positive - CBC, T&S, RPR on admit - Clear liquid diet , continuous IV fluids  4. Monitoring of labor  - Contractions monitored with external toco - Pelvis adequate for trial of labor  - Plan for induction with misoprostol and  cervical balloon  as appropriate  - Augmentation with oxytocin and AROM as appropriate  - Plan for  continuous fetal monitoring - Maternal pain control as desired; planning regional anesthesia - Anticipate vaginal delivery  5. Post Partum Planning: - Infant feeding: breast feeding - Contraception:  undecided  - Flu vaccine: Allergic to vaccine  - Tdap vaccine: Given prenatally on 08/30/2023 - RSV vaccine: Not given   Roney Jaffe, CNM 11/17/23 8:36 PM  Roney Jaffe, CNM Certified Nurse Midwife [facility]

## 2023-11-18 ENCOUNTER — Inpatient Hospital Stay: Admitting: Anesthesiology

## 2023-11-18 ENCOUNTER — Encounter: Payer: Self-pay | Admitting: Obstetrics and Gynecology

## 2023-11-18 DIAGNOSIS — O99210 Obesity complicating pregnancy, unspecified trimester: Secondary | ICD-10-CM

## 2023-11-18 DIAGNOSIS — B951 Streptococcus, group B, as the cause of diseases classified elsewhere: Secondary | ICD-10-CM

## 2023-11-18 DIAGNOSIS — O09299 Supervision of pregnancy with other poor reproductive or obstetric history, unspecified trimester: Secondary | ICD-10-CM

## 2023-11-18 LAB — RPR: RPR Ser Ql: NONREACTIVE

## 2023-11-18 MED ORDER — ZOLPIDEM TARTRATE 5 MG PO TABS: 5.0000 mg | ORAL_TABLET | Freq: Every evening | ORAL | Status: DC | PRN

## 2023-11-18 MED ORDER — FENTANYL-BUPIVACAINE-NACL 0.5-0.125-0.9 MG/250ML-% EP SOLN
12.0000 mL/h | EPIDURAL | Status: DC | PRN
  Administered 2023-11-18: 12 mL/h via EPIDURAL

## 2023-11-18 MED ORDER — ACETAMINOPHEN 325 MG PO TABS
650.0000 mg | ORAL_TABLET | ORAL | Status: DC | PRN
  Administered 2023-11-19: 650 mg via ORAL
  Filled 2023-11-18: qty 2

## 2023-11-18 MED ORDER — COCONUT OIL OIL
1.0000 | TOPICAL_OIL | Status: DC | PRN
  Administered 2023-11-19: 1 via TOPICAL
  Filled 2023-11-18: qty 7.5

## 2023-11-18 MED ORDER — SENNOSIDES-DOCUSATE SODIUM 8.6-50 MG PO TABS
2.0000 | ORAL_TABLET | Freq: Every day | ORAL | Status: DC
  Administered 2023-11-19: 2 via ORAL
  Filled 2023-11-18: qty 2

## 2023-11-18 MED ORDER — WITCH HAZEL-GLYCERIN EX PADS
1.0000 | MEDICATED_PAD | CUTANEOUS | Status: DC | PRN
  Administered 2023-11-18: 1 via TOPICAL
  Filled 2023-11-18: qty 100

## 2023-11-18 MED ORDER — ONDANSETRON HCL 4 MG/2ML IJ SOLN: 4.0000 mg | INTRAMUSCULAR | Status: DC | PRN

## 2023-11-18 MED ORDER — LIDOCAINE HCL (PF) 1 % IJ SOLN
INTRAMUSCULAR | Status: DC | PRN
  Administered 2023-11-18: 3 mL via SUBCUTANEOUS

## 2023-11-18 MED ORDER — TETANUS-DIPHTH-ACELL PERTUSSIS 5-2.5-18.5 LF-MCG/0.5 IM SUSY: 0.5000 mL | PREFILLED_SYRINGE | Freq: Once | INTRAMUSCULAR | Status: DC

## 2023-11-18 MED ORDER — DIBUCAINE (PERIANAL) 1 % EX OINT
1.0000 | TOPICAL_OINTMENT | CUTANEOUS | Status: DC | PRN
  Administered 2023-11-18: 1 via RECTAL
  Filled 2023-11-18: qty 28

## 2023-11-18 MED ORDER — FAMOTIDINE 20 MG PO TABS
20.0000 mg | ORAL_TABLET | Freq: Two times a day (BID) | ORAL | Status: DC
  Administered 2023-11-18 (×2): 20 mg via ORAL
  Filled 2023-11-18 (×2): qty 1

## 2023-11-18 MED ORDER — EPHEDRINE 5 MG/ML INJ: 10.0000 mg | INTRAVENOUS | Status: DC | PRN

## 2023-11-18 MED ORDER — CALCIUM CARBONATE ANTACID 500 MG PO CHEW
CHEWABLE_TABLET | ORAL | Status: AC
  Filled 2023-11-18: qty 2

## 2023-11-18 MED ORDER — SERTRALINE HCL 25 MG PO TABS
125.0000 mg | ORAL_TABLET | Freq: Every day | ORAL | Status: DC
  Administered 2023-11-18: 125 mg via ORAL
  Filled 2023-11-18: qty 1

## 2023-11-18 MED ORDER — BENZOCAINE-MENTHOL 20-0.5 % EX AERO
1.0000 | INHALATION_SPRAY | CUTANEOUS | Status: DC | PRN
  Administered 2023-11-18: 1 via TOPICAL
  Filled 2023-11-18 (×2): qty 56

## 2023-11-18 MED ORDER — IBUPROFEN 600 MG PO TABS
600.0000 mg | ORAL_TABLET | Freq: Four times a day (QID) | ORAL | Status: DC
  Administered 2023-11-18 – 2023-11-19 (×4): 600 mg via ORAL
  Filled 2023-11-18 (×4): qty 1

## 2023-11-18 MED ORDER — SIMETHICONE 80 MG PO CHEW: 80.0000 mg | CHEWABLE_TABLET | ORAL | Status: DC | PRN

## 2023-11-18 MED ORDER — OXYTOCIN-SODIUM CHLORIDE 30-0.9 UT/500ML-% IV SOLN
1.0000 m[IU]/min | INTRAVENOUS | Status: DC
  Administered 2023-11-18: 2 m[IU]/min via INTRAVENOUS

## 2023-11-18 MED ORDER — DIPHENHYDRAMINE HCL 50 MG/ML IJ SOLN: 12.5000 mg | INTRAMUSCULAR | Status: DC | PRN

## 2023-11-18 MED ORDER — BUPIVACAINE HCL (PF) 0.25 % IJ SOLN
INTRAMUSCULAR | Status: DC | PRN
  Administered 2023-11-18 (×2): 4 mL via EPIDURAL

## 2023-11-18 MED ORDER — PRENATAL MULTIVITAMIN CH
1.0000 | ORAL_TABLET | Freq: Every day | ORAL | Status: DC
  Administered 2023-11-19: 1 via ORAL
  Filled 2023-11-18: qty 1

## 2023-11-18 MED ORDER — DIPHENHYDRAMINE HCL 25 MG PO CAPS: 25.0000 mg | ORAL_CAPSULE | Freq: Four times a day (QID) | ORAL | Status: DC | PRN

## 2023-11-18 MED ORDER — PHENYLEPHRINE 80 MCG/ML (10ML) SYRINGE FOR IV PUSH (FOR BLOOD PRESSURE SUPPORT): 80.0000 ug | PREFILLED_SYRINGE | INTRAVENOUS | Status: DC | PRN

## 2023-11-18 MED ORDER — LIDOCAINE-EPINEPHRINE (PF) 1.5 %-1:200000 IJ SOLN
INTRAMUSCULAR | Status: DC | PRN
  Administered 2023-11-18: 3 mL via EPIDURAL

## 2023-11-18 MED ORDER — ONDANSETRON HCL 4 MG PO TABS: 4.0000 mg | ORAL_TABLET | ORAL | Status: DC | PRN

## 2023-11-18 MED ORDER — FENTANYL-BUPIVACAINE-NACL 0.5-0.125-0.9 MG/250ML-% EP SOLN
EPIDURAL | Status: AC
  Filled 2023-11-18: qty 250

## 2023-11-18 MED ORDER — SERTRALINE HCL 25 MG PO TABS
150.0000 mg | ORAL_TABLET | Freq: Every day | ORAL | Status: DC
  Administered 2023-11-19: 150 mg via ORAL
  Filled 2023-11-18: qty 2

## 2023-11-18 MED ORDER — LACTATED RINGERS IV SOLN: 500.0000 mL | Freq: Once | INTRAVENOUS | Status: DC

## 2023-11-18 NOTE — Progress Notes (Signed)
 L&D Note    Subjective:  Patient resting comfortable on left side tilt in bed. Epidural in place. She has no unusual complaints.   Objective:   Vitals:   11/18/23 0121 11/18/23 0125 11/18/23 0130 11/18/23 0230  BP: 112/72     Pulse: 88     Resp:      Temp:    98 F (36.7 C)  TempSrc:    Oral  SpO2:  98% 98%   Weight:      Height:        Current Vital Signs 24h Vital Sign Ranges  T 98 F (36.7 C) Temp  Avg: 97.9 F (36.6 C)  Min: 97.7 F (36.5 C)  Max: 98 F (36.7 C)  BP 112/72 BP  Min: 100/59  Max: 124/67  HR 88 Pulse  Avg: 95.8  Min: 88  Max: 105  RR 16 Resp  Avg: 16  Min: 16  Max: 16  SaO2 98 %   SpO2  Avg: 98.6 %  Min: 98 %  Max: 99 %      Gen: alert, cooperative, no distress FHR: Baseline: 135 bpm, Variability: moderate, Accels: Present, Decels: none Toco: regular, every 1-3 minutes SVE: Dilation: 2 Effacement (%): Thick Station: -3 Exam by:: D Cox RN  Medications SCHEDULED MEDICATIONS   ammonia       calcium carbonate       famotidine  20 mg Oral BID   misoprostol       misoprostol  25 mcg Vaginal Q4H   oxytocin       oxytocin 40 units in LR 1000 mL  333 mL Intravenous Once   sertraline  125 mg Oral Daily   sodium chloride flush  3 mL Intravenous Q12H    MEDICATION INFUSIONS   sodium chloride     fentaNYL 2 mcg/mL w/bupivacaine 0.125% in NS 250 mL 12 mL/hr (11/18/23 0044)   lactated ringers     lactated ringers     lactated ringers 125 mL/hr at 11/18/23 0238   oxytocin     pencillin G potassium IV 3 Million Units (11/18/23 0206)    PRN MEDICATIONS  sodium chloride, acetaminophen, ammonia, calcium carbonate, diphenhydrAMINE, ePHEDrine, ePHEDrine, fentaNYL (SUBLIMAZE) injection, fentaNYL 2 mcg/mL w/bupivacaine 0.125% in NS 250 mL, lactated ringers, lidocaine (PF), misoprostol, ondansetron, oxytocin, phenylephrine, phenylephrine, sodium chloride flush, sodium citrate-citric acid, terbutaline   Assessment & Plan:  33 y.o. [id number] at [redacted]w[redacted]d admitted for  rupture of membranes.  -Labor: Satisfactory labor progress. Received oral/vaginal Cytotec x1 and vaginal Cytotec x 1. Contractions every 1-3 minutes. Will plan to start IV Pitocin as appropriate.  -Fetal Well-being: Category I -GBS: positive  - Received Penicillin x 2 -Membranes: SROM @ 1848, clear  -Augmentation: IV Pitocin augmentation. - Interventions: maternal position changes  -Analgesia: regional anesthesia   Roney Jaffe, CNM  11/18/2023 4:42 AM  [facility]

## 2023-11-18 NOTE — Progress Notes (Signed)
 Labor Progress Note  Katelyn Whitehead is a 33 y.o. [phone] at [redacted]w[redacted]d by LMP admitted for active labor, rupture of membranes  Subjective: she reports stronger rectal pressure  Objective: BP (!) 114/58   Pulse 74   Temp 97.6 F (36.4 C) (Oral)   Resp 16   Ht 5\' 2"  (1.575 m)   Wt 87.5 kg   LMP 02/22/2023 Comment: Positive home test  SpO2 100%   BMI 35.30 kg/m  Notable VS details: reviewed  Fetal Assessment: FHT:  FHR: 140 bpm, variability: moderate,  accelerations:  Present,  decelerations:  Present intermittent late deceleration x1 Category/reactivity:  Category II, overall reassuring UC:   regular, every 3-4 minutes SVE:    Dilation: 10cm  Effacement: 100%  Station:  +2  Consistency: soft  Position: anterior  Membrane status:SROM @ 1848 Amniotic color: clear  Labs: Lab Results  Component Value Date   WBC 7.8 11/17/2023   HGB 13.4 11/17/2023   HCT 39.5 11/17/2023   MCV 86.2 11/17/2023   PLT 209 11/17/2023    Assessment / Plan: 33 year old G4P1021 at [redacted]w[redacted]d with SROM and labor augmentation  Labor:  She is 10/100/+2 and feeling rectal pressure. Will set up the room for delivery and begin pushing. Pitocin at 53mu/min Preeclampsia:  labs stable Fetal Wellbeing:  Category II, overall reassuring, had one late deceleration that resolved with position changes Pain Control:  Epidural I/D:   GBS positive, treated x 4 doses Anticipated MOD:  NSVD  Janyce Llanos, CNM 11/18/2023, 11:46 AM

## 2023-11-18 NOTE — Progress Notes (Signed)
 Labor Progress Note  Katelyn Whitehead is a 33 y.o. [phone] at [redacted]w[redacted]d by LMP admitted for active labor, rupture of membranes  Subjective: she is comfortable with her epidural, feels intermittent pelvic pressure, denies rectal pressure  Objective: BP (!) 104/59   Pulse 80   Temp 97.6 F (36.4 C) (Oral)   Resp 16   Ht 5\' 2"  (1.575 m)   Wt 87.5 kg   LMP 02/22/2023 Comment: Positive home test  SpO2 98%   BMI 35.30 kg/m  Notable VS details: reviewed  Fetal Assessment: FHT:  FHR: 140 bpm, variability: moderate,  accelerations:  Present,  decelerations:  Present intermittent variable decelerations with quick recovery Category/reactivity:  Category II, overall reassuring UC:   regular, every 4-5 minutes SVE:   by RN exam Dilation: 5cm  Effacement: 60%  Station:  -2  Consistency: medium  Position: posterior  Membrane status:SROM @ 1848 Amniotic color: clear  Labs: Lab Results  Component Value Date   WBC 7.8 11/17/2023   HGB 13.4 11/17/2023   HCT 39.5 11/17/2023   MCV 86.2 11/17/2023   PLT 209 11/17/2023    Assessment / Plan: 33 year old G4P1021 at [redacted]w[redacted]d with SROM and labor augmentation  Labor:  Good labor progress, pitocin at 40mu/min, will recheck around 1030 unless indicated sooner Preeclampsia:  labs stable Fetal Wellbeing:  Category II, overall reassuring, intermittent variable decelerations, reactive afterward Pain Control:  Epidural I/D:   GBS positive, treated x 3 doses Anticipated MOD:  NSVD  Janyce Llanos, CNM 11/18/2023, 10:48 AM

## 2023-11-18 NOTE — Discharge Summary (Signed)
 Postpartum Discharge Summary  Patient Name: Katelyn Whitehead DOB: 1990/12/31 MRN: [id number]  Date of admission: 11/17/2023 Delivery date:11/18/2023 Delivering provider: Lavanda Porter RENEE Date of discharge: 11/19/2023  [facility] BJY:[id number] last menstrual period was 02/22/2023. EDC Estimated Date of Delivery: 11/29/23 Gestational Age at Delivery: [redacted]w[redacted]d   Admitting diagnosis: Leakage of amniotic fluid [O42.90] Intrauterine pregnancy: [redacted]w[redacted]d     Secondary diagnosis:   Principal Problem:   NSVD (normal spontaneous vaginal delivery) Active Problems:   Depression, major, recurrent, moderate (HCC)   Persistent asthma without complication   LGA (large for gestational age) fetus affecting management of mother   Leakage of amniotic fluid   Positive GBS test   Obesity affecting pregnancy   History of delivery by vacuum extraction, currently pregnant  Discharge Diagnosis: Term Pregnancy Delivered                                                Post partum procedures: none Augmentation:: Pitocin Complications: None Delivery Type: spontaneous vaginal delivery Anesthesia: epidural anesthesia Placenta: spontaneous To Pathology: No  Laceration: periurethral, repaired with 3-0 Vicryl Episiotomy: none  Prenatal Labs:  Blood type/Rh O POS   Antibody screen Negative    Rubella Immune     Varicella Non Immune  RPR Non Reactive    HBsAg Negative   Hep C Non Reactive   HIV Negative    GC Negative  Chlamydia Negative  Genetic screening cfDNA negative   1 hour GTT 153  3 hour GTT N/A  GBS Positive      Hospital course: Onset of Labor With Vaginal Delivery      33 y.o. yo [id number] at [redacted]w[redacted]d was admitted in Latent Labor on 11/17/2023 due to SROM. Labor course was without complication. She was augmented with pitocin and progressed to 10/100/+2 and pushed , delivering viable female infant over intact perineum. Tight nuchal cord requiring somersault maneuver.  Periurethral tear repaired with 3-0 Vicryl and figure-eight stitch.  Membrane Rupture Time/Date: 6:48 PM,11/17/2023  Delivery Method:Vaginal, Spontaneous Operative Delivery:N/A Episiotomy: None Lacerations:  Periurethral Patient had an uncomplicated postpartum course She is ambulating, tolerating a regular diet, passing flatus, and urinating well. Patient is discharged home in stable condition on 11/19/23.  Newborn Data: "Callie" Birth date:11/18/2023 Birth time:12:12 PM Gender:Female Living status:Living Apgars:3 ,8  Weight:3730 g  Magnesium Sulfate received: No BMZ received: No Rhophylac:No MMR:No Varivax vaccine given: was given T-DaP:Given prenatally Flu: No  Transfusion:No  Physical exam  Vitals:   11/18/23 2334 11/19/23 0310 11/19/23 0800 11/19/23 1531  BP: 138/86 123/75 120/65 120/88  Pulse: 81 82 77 93  Resp: 18 18 20    Temp: 98.4 F (36.9 C) 98 F (36.7 C) 98.1 F (36.7 C) 98.3 F (36.8 C)  TempSrc: Oral Oral Oral Oral  SpO2: 100% 98% 100% 100%  Weight:      Height:       General: alert, cooperative, and no distress Lochia: appropriate Uterine Fundus: firm Perineum:minimal edema/laceration hemostatic DVT Evaluation: No evidence of DVT seen on physical exam. Negative Homan's sign. No cords or calf tenderness. No significant calf/ankle edema.  Labs: Lab Results  Component Value Date   WBC 8.5 11/19/2023   HGB 12.4 11/19/2023   HCT 36.2 11/19/2023   MCV 86.0 11/19/2023   PLT 200 11/19/2023      Latest Ref Rng & Units  11/04/2023    1:58 PM  CMP  Glucose 70 - 99 mg/dL 81   BUN 6 - 20 mg/dL 8   Creatinine 5.78 - 4.69 mg/dL 6.29   Sodium 528 - 413 mmol/L 138   Potassium 3.5 - 5.1 mmol/L 3.4   Chloride 98 - 111 mmol/L 104   CO2 22 - 32 mmol/L 25   Calcium 8.9 - 10.3 mg/dL 8.5   Total Protein 6.5 - 8.1 g/dL 6.1   Total Bilirubin 0.0 - 1.2 mg/dL 0.5   Alkaline Phos 38 - 126 U/L 122   AST 15 - 41 U/L 26   ALT 0 - 44 U/L 24    Edinburgh  Score:    11/18/2023    6:28 PM  Edinburgh Postnatal Depression Scale Screening Tool  I have been able to laugh and see the funny side of things. 1  I have looked forward with enjoyment to things. 1  I have blamed myself unnecessarily when things went wrong. 3  I have been anxious or worried for no good reason. 3  I have felt scared or panicky for no good reason. 3  Things have been getting on top of me. 1  I have been so unhappy that I have had difficulty sleeping. 0  I have felt sad or miserable. 0  I have been so unhappy that I have been crying. 1  The thought of harming myself has occurred to me. 0  Edinburgh Postnatal Depression Scale Total 13    Risk assessment for postpartum VTE and prophylactic treatment: Very high risk factors: None High risk factors: None Moderate risk factors: BMI 30-40 kg/m2  Postpartum VTE prophylaxis with LMWH not indicated  After visit meds:  Allergies as of 11/19/2023       Reactions   Influenza Vaccines Hives   Sulfa Antibiotics    Hives Rash        Medication List     STOP taking these medications    Clotrimazole 3 2 % vaginal cream Generic drug: clotrimazole   FreeStyle Freedom Lite w/Device Kit   IRON PO       TAKE these medications    acetaminophen 325 MG tablet Commonly known as: Tylenol Take 2 tablets (650 mg total) by mouth every 4 (four) hours as needed (for pain scale < 4). What changed:  medication strength how much to take when to take this reasons to take this   albuterol 108 (90 Base) MCG/ACT inhaler Commonly known as: VENTOLIN HFA INHALE 2 PUFFS BY MOUTH EVERY 6 HOURS AS NEEDED FOR WHEEZE OR SHORTNESS OF BREATH   benzocaine-Menthol 20-0.5 % Aero Commonly known as: DERMOPLAST Apply 1 Application topically as needed for irritation (perineal discomfort).   coconut oil Oil Apply 1 Application topically as needed.   dibucaine 1 % Oint Commonly known as: NUPERCAINAL Place 1 Application rectally as  needed for hemorrhoids.   famotidine 20 MG tablet Commonly known as: PEPCID Take 1 tablet (20 mg total) by mouth 2 (two) times daily   Fluticasone Propionate Diskus 250 MCG/ACT Aepb Inhale 1 puff into the lungs 2 (two) times daily.   ibuprofen 600 MG tablet Commonly known as: ADVIL Take 1 tablet (600 mg total) by mouth every 6 (six) hours.   LORazepam 0.5 MG tablet Commonly known as: ATIVAN Take 0.5 tablets (0.25 mg total) by mouth every morning AND 0.5-1 tablets (0.25-0.5 mg total) every evening as needed for high anxiety   prenatal multivitamin Tabs tablet Take 1 tablet  by mouth daily at 12 noon.   senna-docusate 8.6-50 MG tablet Commonly known as: Senokot-S Take 2 tablets by mouth daily. Start taking on: November 20, 2023   sertraline 25 MG tablet Commonly known as: ZOLOFT Take 1 tablet (25 mg total) by mouth daily. What changed: Another medication with the same name was changed. Make sure you understand how and when to take each.   sertraline 50 MG tablet Commonly known as: ZOLOFT Take 3 tablets (150 mg total) by mouth daily. Start taking on: November 20, 2023 What changed: medication strength   simethicone 80 MG chewable tablet Commonly known as: MYLICON Chew 1 tablet (80 mg total) by mouth as needed for flatulence.   witch hazel-glycerin pad Commonly known as: TUCKS Apply 1 Application topically as needed for hemorrhoids.       Discharge home in stable condition Infant Feeding: Breast Infant Disposition:home with mother Discharge instruction: per After Visit Summary and Postpartum booklet. Activity: Advance as tolerated. Pelvic rest for 6 weeks.  Diet: routine diet Anticipated Birth Control: POPs Postpartum Appointment:6 weeks Additional Postpartum F/U: Postpartum Depression checkup Future Appointments:No future appointments. Follow up Visit:  Follow-up Information     Auston Left, CNM Follow up in 2 week(s).   Specialty: Certified Nurse  Midwife Why: 2wk mood check Contact information: [address] [place] [address] [phone]         Auston Left, CNM Follow up in 6 week(s).   Specialty: Certified Nurse Midwife Why: 6wk postpartum Contact information: [address] [place] [address] [phone]                 Plan:  CHIKITA DOGAN was discharged to home in good condition. Follow-up appointment as directed.    Signed:  Rufino Staup, CNM 11/19/2023 4:30 PM

## 2023-11-18 NOTE — Anesthesia Procedure Notes (Signed)
 Epidural Patient location during procedure: OB Start time: 11/18/2023 12:32 AM End time: 11/18/2023 12:35 AM  Staffing Anesthesiologist: Demitrius Crass, Cleda Mccreedy, MD Performed: anesthesiologist   Preanesthetic Checklist Completed: patient identified, IV checked, site marked, risks and benefits discussed, surgical consent, monitors and equipment checked, pre-op evaluation and timeout performed  Epidural Patient position: sitting Prep: ChloraPrep Patient monitoring: heart rate, continuous pulse ox and blood pressure Approach: midline Location: L3-L4 Injection technique: LOR saline  Needle:  Needle type: Tuohy  Needle gauge: 17 G Needle length: 9 cm and 9 Needle insertion depth: 5 cm Catheter type: closed end flexible Catheter size: 19 Gauge Catheter at skin depth: 11 cm Test dose: negative and 1.5% lidocaine with Epi 1:200 K  Assessment Sensory level: T10 Events: blood not aspirated, no cerebrospinal fluid, injection not painful, no injection resistance, no paresthesia and negative IV test  Additional Notes 1 attempt Pt. Evaluated and documentation done after procedure finished. Patient identified. Risks/Benefits/Options discussed with patient including but not limited to bleeding, infection, nerve damage, paralysis, failed block, incomplete pain control, headache, blood pressure changes, nausea, vomiting, reactions to medication both or allergic, itching and postpartum back pain. Confirmed with bedside nurse the patient's most recent platelet count. Confirmed with patient that they are not currently taking any anticoagulation, have any bleeding history or any family history of bleeding disorders. Patient expressed understanding and wished to proceed. All questions were answered. Sterile technique was used throughout the entire procedure. Please see nursing notes for vital signs. Test dose was given through epidural catheter and negative prior to continuing to dose epidural or start  infusion. Warning signs of high block given to the patient including shortness of breath, tingling/numbness in hands, complete motor block, or any concerning symptoms with instructions to call for help. Patient was given instructions on fall risk and not to get out of bed. All questions and concerns addressed with instructions to call with any issues or inadequate analgesia.    Patient tolerated the insertion well without immediate complications.Reason for block:procedure for pain

## 2023-11-18 NOTE — Progress Notes (Addendum)
 Labor Progress Note  Katelyn Whitehead is a 33 y.o. [phone] at [redacted]w[redacted]d by LMP admitted for active labor, rupture of membranes  Subjective: she is feeling rectal pressure  Objective: BP (!) 104/59   Pulse 80   Temp 97.6 F (36.4 C) (Oral)   Resp 16   Ht 5\' 2"  (1.575 m)   Wt 87.5 kg   LMP 02/22/2023 Comment: Positive home test  SpO2 98%   BMI 35.30 kg/m  Notable VS details: reviewed  Fetal Assessment: FHT:  FHR: 140 bpm, variability: moderate,  accelerations:  Present,  decelerations:  Present intermittent early decelerations Category/reactivity:  Category I UC:   regular, every 3-4 minutes SVE:    Dilation: 9.5cm  Effacement: 100%  Station:  +1  Consistency: soft  Position: anterior  Membrane status:SROM @ 1848 Amniotic color: clear  Labs: Lab Results  Component Value Date   WBC 7.8 11/17/2023   HGB 13.4 11/17/2023   HCT 39.5 11/17/2023   MCV 86.2 11/17/2023   PLT 209 11/17/2023    Assessment / Plan: 33 year old [id number] at [redacted]w[redacted]d with SROM and labor augmentation  Labor:  Good labor progress, now 35/100/+1 and starting to feel rectal pressure, will continue position changes and recheck in 1hr unless indicated sooner, pitocin at 23mu/min ,  Preeclampsia:  labs stable Fetal Wellbeing:  Category I Pain Control:  Epidural I/D:   GBS positive, treated x4 doses Anticipated MOD:  NSVD  Janyce Llanos, CNM 11/18/2023, 10:38 AM

## 2023-11-18 NOTE — Anesthesia Preprocedure Evaluation (Signed)
 Anesthesia Evaluation  Patient identified by MRN, date of birth, ID band Patient awake    Reviewed: Allergy & Precautions, NPO status , Patient's Chart, lab work & pertinent test results  History of Anesthesia Complications Negative for: history of anesthetic complications  Airway Mallampati: III  TM Distance: <3 FB Neck ROM: full    Dental  (+) Chipped   Pulmonary asthma    Pulmonary exam normal        Cardiovascular Exercise Tolerance: Good (-) hypertensionnegative cardio ROS Normal cardiovascular exam     Neuro/Psych    GI/Hepatic negative GI ROS,,,  Endo/Other  diabetes, Gestational    Renal/GU Renal disease  negative genitourinary   Musculoskeletal   Abdominal   Peds  Hematology negative hematology ROS (+)   Anesthesia Other Findings Past Medical History: No date: ADHD (attention deficit hyperactivity disorder) No date: Anxiety No date: Asthma     Comment:  childhood 08/2020: COVID-19 virus infection No date: Depression No date: Kidney stone No date: Mild mitral and aortic regurgitation     Comment:  a. 04/2013 Echo: EF 50-55%, mild AI, mild MR, no MVP. No date: Mitral valve prolapse No date: Palpitations     Comment:  a. 2012 Holter: sinus tach, pvc's. No date: Panic disorder 07/11/2023: URI, acute No date: Urticaria  Past Surgical History: 02/26/2021: CYSTOSCOPY/URETEROSCOPY/HOLMIUM LASER/STENT PLACEMENT;  Right     Comment:  Procedure: CYSTOSCOPY/URETEROSCOPY, RIGHT STENT               PLACEMENT;  Surgeon: Sondra Come, MD;  Location:               [facility] ORS;  Service: Urology;  Laterality: Right; 03/20/2021: CYSTOSCOPY/URETEROSCOPY/HOLMIUM LASER/STENT PLACEMENT;  Right     Comment:  Procedure: CYSTOSCOPY/URETEROSCOPY/HOLMIUM LASER/STENT               EXCHANGE;  Surgeon: Sondra Come, MD;  Location: [facility]              ORS;  Service: Urology;  Laterality: Right; 11/22/2016: IUD REMOVAL;  N/A     Comment:  Procedure: LAPAROCOPIC INTRAUTERINE DEVICE (IUD)               REMOVAL;  Surgeon: Linzie Collin, MD;  Location: [facility]              ORS;  Service: Gynecology;  Laterality: N/A; No date: TYMPANOSTOMY TUBE PLACEMENT No date: WISDOM TOOTH EXTRACTION  BMI    Body Mass Index: 35.30 kg/m      Reproductive/Obstetrics (+) Pregnancy                             Anesthesia Physical Anesthesia Plan  ASA: 3  Anesthesia Plan: Epidural   Post-op Pain Management:    Induction:   PONV Risk Score and Plan:   Airway Management Planned: Natural Airway  Additional Equipment:   Intra-op Plan:   Post-operative Plan:   Informed Consent: I have reviewed the patients History and Physical, chart, labs and discussed the procedure including the risks, benefits and alternatives for the proposed anesthesia with the patient or authorized representative who has indicated his/her understanding and acceptance.     Dental Advisory Given  Plan Discussed with: Anesthesiologist  Anesthesia Plan Comments: (Patient reports no bleeding problems and no anticoagulant use.   Patient consented for risks of anesthesia including but not limited to:  - adverse reactions to medications - risk of bleeding, infection and  or nerve damage from epidural that could lead to paralysis - risk of headache or failed epidural - nerve damage due to positioning - that if epidural is used for C-section that there is a chance of epidural failure requiring spinal placement or conversion to GA - Damage to heart, brain, lungs, other parts of body or loss of life  Patient voiced understanding and assent.)       Anesthesia Quick Evaluation

## 2023-11-19 LAB — CBC
HCT: 36.2 % (ref 36.0–46.0)
Hemoglobin: 12.4 g/dL (ref 12.0–15.0)
MCH: 29.5 pg (ref 26.0–34.0)
MCHC: 34.3 g/dL (ref 30.0–36.0)
MCV: 86 fL (ref 80.0–100.0)
Platelets: 200 10*3/uL (ref 150–400)
RBC: 4.21 MIL/uL (ref 3.87–5.11)
RDW: 14.1 % (ref 11.5–15.5)
WBC: 8.5 10*3/uL (ref 4.0–10.5)
nRBC: 0 % (ref 0.0–0.2)

## 2023-11-19 MED ORDER — WITCH HAZEL-GLYCERIN EX PADS: 1.0000 | MEDICATED_PAD | CUTANEOUS | Status: DC | PRN

## 2023-11-19 MED ORDER — COCONUT OIL OIL: 1.0000 | TOPICAL_OIL | Status: DC | PRN

## 2023-11-19 MED ORDER — SENNOSIDES-DOCUSATE SODIUM 8.6-50 MG PO TABS
2.0000 | ORAL_TABLET | Freq: Every day | ORAL | Status: DC
Start: 2023-11-20 — End: 2024-05-14

## 2023-11-19 MED ORDER — DIBUCAINE (PERIANAL) 1 % EX OINT: 1.0000 | TOPICAL_OINTMENT | CUTANEOUS | Status: DC | PRN

## 2023-11-19 MED ORDER — IBUPROFEN 600 MG PO TABS: 600.0000 mg | ORAL_TABLET | Freq: Four times a day (QID) | ORAL | 0 refills | Status: DC

## 2023-11-19 MED ORDER — VARICELLA VIRUS VACCINE LIVE 1350 PFU/0.5ML IJ SUSR
0.5000 mL | INTRAMUSCULAR | Status: DC | PRN
  Filled 2023-11-19 (×2): qty 0.5

## 2023-11-19 MED ORDER — ACETAMINOPHEN 325 MG PO TABS: 650.0000 mg | ORAL_TABLET | ORAL | Status: AC | PRN

## 2023-11-19 MED ORDER — SERTRALINE HCL 50 MG PO TABS: 150.0000 mg | ORAL_TABLET | Freq: Every day | ORAL | 0 refills | Status: AC

## 2023-11-19 MED ORDER — SIMETHICONE 80 MG PO CHEW: 80.0000 mg | CHEWABLE_TABLET | ORAL | Status: DC | PRN

## 2023-11-19 MED ORDER — BENZOCAINE-MENTHOL 20-0.5 % EX AERO: 1.0000 | INHALATION_SPRAY | CUTANEOUS | Status: DC | PRN

## 2023-11-19 NOTE — Anesthesia Postprocedure Evaluation (Signed)
 Anesthesia Post Note  Patient: Katelyn Whitehead  Procedure(s) Performed: AN AD HOC LABOR EPIDURAL  Patient location during evaluation: Mother Baby Anesthesia Type: Epidural Level of consciousness: awake and alert Pain management: pain level controlled Vital Signs Assessment: post-procedure vital signs reviewed and stable Respiratory status: spontaneous breathing, nonlabored ventilation and respiratory function stable Cardiovascular status: stable Postop Assessment: no headache, no backache, no apparent nausea or vomiting and able to ambulate Anesthetic complications: no   No notable events documented.   Last Vitals:  Vitals:   11/19/23 0310 11/19/23 0800  BP: 123/75 120/65  Pulse: 82 77  Resp: 18 20  Temp: 36.7 C 36.7 C  SpO2: 98% 100%    Last Pain:  Vitals:   11/19/23 1023  TempSrc:   PainSc: 5                  Baltazar Bonier

## 2023-11-19 NOTE — Progress Notes (Signed)
Discharge instructions reviewed with patient and her significant other.  Questions answered and follow up care reviewed.  Printed copies given to patient for reference after discharge home.

## 2023-11-19 NOTE — Clinical Social Work Maternal (Signed)
 CLINICAL SOCIAL WORK MATERNAL/CHILD NOTE  Patient Details  Name: Katelyn Whitehead MRN: [id number] Date of Birth: 04/15/1991  Date:  07-13-24  Clinical Social Worker Initiating Note:  Kyleigha Markert Date/Time: Initiated:  January 22, 2024/      Child's Name:  Katelyn Whitehead   Biological Parents:  Mother, Father   Need for Interpreter:  None   Reason for Referral:  Other (Comment) Katelyn Whitehead)   Address:  [address] [place] [address]    Phone number:  [phone] (home)     Additional phone number:   Household Members/Support Persons (HM/SP):   Household Member/Support Person 1, Household Member/Support Person 2   HM/SP Name Relationship DOB or Age  HM/SP -1 [employer] FOB unknown  HM/SP -2 Elan Mcelvain daughter 7 years  HM/SP -3        HM/SP -4        HM/SP -5        HM/SP -6        HM/SP -7        HM/SP -8          Natural Supports (not living in the home):  Immediate Family, Extended Family   Professional Supports:     Employment:     Type of Work:     Education:      Homebound arranged:    Architect:      Other Resources:  [facility], Food Stamps     Cultural/Religious Considerations Which May Impact Care:    Strengths:  Ability to meet basic needs  , Psychotropic Medications, Compliance with medical plan  , Home prepared for child  , Pediatrician chosen   Psychotropic Medications:  Zoloft      Pediatrician:    [employer]  Pediatrician List:   [facility]  [facility]      Pediatrician Fax Number:    Risk Factors/Current Problems:      Cognitive State:  Alert  , Able to Concentrate     Mood/Affect:  Calm  , Happy     CSW Assessment:  CSW received a consult for Edinburgh Score.   CSW spoke with RN prior to meeting with MOB. Per RN, no additional needs/concerns.   MOB reported she is feeling good post delivery. MOB  was alert/appropriate during assessment.   MOB reported she receives WIC/Food Veterinary surgeon and will inform her Workers of Baby's birth. MOB plans to use [facility] for [facility]. MOB reported she has all items needed for Baby. MOB reported she has reliable transportation for herself and Baby - she and FOB both drive. MOB denied resource needs at this time.   MOB reported she has a history of depression and anxiety. MOB currently takes Zoloft and feels it is effective. MOB sees a Psychiatrist - Katelyn Whitehead - through [facility] and has been seeing her for 7 years. MOB reported she has a good support system and is coping well emotionally at this time - she states her main supports are FOB, her Mother, and Mother-in-law. MOB denied SI, HI, or DV. MOB denied the need for mental health support resources at this time, reported she is aware of resources if needed.  CSW provided education on PPD and SIDS. MOB verbalized understanding. CSW ecouraged MOB to reach out to her Provider with any questions or needs for support or resources, even after discharge.  MOB denied any needs or questions at this time. CSW encouraged MOB to reach out if any arise prior to discharge.   Please re consult CSW if any additional needs or concerns arise.  CSW Plan/Description:  No Further Intervention Required/No Barriers to Discharge, Sudden Infant Death Syndrome (SIDS) Education, Perinatal Mood and Anxiety Disorder (PMADs) Education    Randell Bussing, LCSW Oct 23, 2023, 12:36 PM

## 2023-11-19 NOTE — Lactation Note (Addendum)
 This note was copied from a baby's chart. Lactation Consultation Note  Patient Name: Katelyn Whitehead [id number] Date: 11/19/2023 Age:35 hours Reason for consult: Follow-up assessment;Early term 37-38.6wks   Maternal Data Does the patient have breastfeeding experience prior to this delivery?: Yes How long did the patient breastfeed?: 3 yrs Addendum:  1600 ,Before discharge mom offered rental of Symphony breast pump, she desires to wait and see how she does with her own pump and may rent later. Feeding Mother's Current Feeding Choice: Breast Milk and Donor Milk Mom has not been offering breast overnight because baby's glucose was low, had been feeding DBM per bottle, pumping with symphony pump when room entered, encouraged mom to offer breast now, baby awakened in crib, wet diaper changed, baby spit sma amt mucous and milk, now alert, able to latch to right breast in side lying position for 5 min after hand expression, became fussy and would not latch again, then offered left breast in side lying position, again latched for 5 min and then became fussy and would not latch again, mom very sleepy and fell asleep while attempting, encouraged mom to calm baby on her chest and then get some rest. LATCH Score Latch: Repeated attempts needed to sustain latch, nipple held in mouth throughout feeding, stimulation needed to elicit sucking reflex.  Audible Swallowing: A few with stimulation  Type of Nipple: Everted at rest and after stimulation  Comfort (Breast/Nipple): Soft / non-tender  Hold (Positioning): Assistance needed to correctly position infant at breast and maintain latch.  LATCH Score: 7   Lactation Tools Discussed/Used  LC name updated on white board  Interventions Interventions: Breast feeding basics reviewed;Assisted with latch;Skin to skin;Hand express;Adjust position;Support pillows;Position options;Education;DEBP  Discharge Discharge Education: Engorgement and breast  care Pump: DEBP;Personal WIC Program: No Mom has [employer] pump here that she obtained through insurance Consult Status Consult Status: PRN    Katelyn Whitehead 11/19/2023, 12:37 PM

## 2023-11-21 ENCOUNTER — Other Ambulatory Visit: Payer: Self-pay

## 2023-11-22 ENCOUNTER — Other Ambulatory Visit: Payer: Self-pay

## 2023-11-28 ENCOUNTER — Other Ambulatory Visit: Payer: Self-pay

## 2023-11-28 DIAGNOSIS — M5441 Lumbago with sciatica, right side: Secondary | ICD-10-CM | POA: Diagnosis not present

## 2023-11-28 DIAGNOSIS — M5414 Radiculopathy, thoracic region: Secondary | ICD-10-CM | POA: Diagnosis not present

## 2023-11-28 DIAGNOSIS — G8929 Other chronic pain: Secondary | ICD-10-CM | POA: Diagnosis not present

## 2023-11-28 DIAGNOSIS — M546 Pain in thoracic spine: Secondary | ICD-10-CM | POA: Diagnosis not present

## 2023-11-28 DIAGNOSIS — R200 Anesthesia of skin: Secondary | ICD-10-CM | POA: Diagnosis not present

## 2023-11-28 MED ORDER — PREDNISONE 10 MG PO TABS
ORAL_TABLET | ORAL | 0 refills | Status: AC
  Filled 2023-11-28: qty 21, 6d supply, fill #0

## 2023-12-02 DIAGNOSIS — Z9189 Other specified personal risk factors, not elsewhere classified: Secondary | ICD-10-CM | POA: Diagnosis not present

## 2023-12-02 DIAGNOSIS — F32A Depression, unspecified: Secondary | ICD-10-CM | POA: Diagnosis not present

## 2023-12-02 DIAGNOSIS — F419 Anxiety disorder, unspecified: Secondary | ICD-10-CM | POA: Diagnosis not present

## 2023-12-06 ENCOUNTER — Other Ambulatory Visit: Payer: Self-pay | Admitting: Physical Medicine & Rehabilitation

## 2023-12-06 DIAGNOSIS — G8929 Other chronic pain: Secondary | ICD-10-CM

## 2023-12-06 DIAGNOSIS — M5441 Lumbago with sciatica, right side: Secondary | ICD-10-CM

## 2023-12-14 ENCOUNTER — Inpatient Hospital Stay: Admission: RE | Admit: 2023-12-14 | Source: Ambulatory Visit

## 2023-12-19 ENCOUNTER — Inpatient Hospital Stay: Admission: RE | Admit: 2023-12-19 | Source: Ambulatory Visit

## 2023-12-19 ENCOUNTER — Other Ambulatory Visit

## 2023-12-26 ENCOUNTER — Ambulatory Visit
Admission: RE | Admit: 2023-12-26 | Discharge: 2023-12-26 | Disposition: A | Discharge status: Home / Self Care | Source: Ambulatory Visit | Attending: Physical Medicine & Rehabilitation | Admitting: Physical Medicine & Rehabilitation

## 2023-12-26 DIAGNOSIS — F411 Generalized anxiety disorder: Secondary | ICD-10-CM | POA: Diagnosis not present

## 2023-12-26 DIAGNOSIS — M47816 Spondylosis without myelopathy or radiculopathy, lumbar region: Secondary | ICD-10-CM | POA: Diagnosis not present

## 2023-12-26 DIAGNOSIS — M549 Dorsalgia, unspecified: Secondary | ICD-10-CM | POA: Diagnosis not present

## 2023-12-26 DIAGNOSIS — M5441 Lumbago with sciatica, right side: Secondary | ICD-10-CM

## 2023-12-26 DIAGNOSIS — M546 Pain in thoracic spine: Secondary | ICD-10-CM

## 2023-12-26 DIAGNOSIS — F422 Mixed obsessional thoughts and acts: Secondary | ICD-10-CM | POA: Diagnosis not present

## 2023-12-30 ENCOUNTER — Other Ambulatory Visit: Payer: Self-pay

## 2024-01-06 ENCOUNTER — Other Ambulatory Visit: Payer: Self-pay

## 2024-01-06 DIAGNOSIS — G8929 Other chronic pain: Secondary | ICD-10-CM | POA: Diagnosis not present

## 2024-01-06 DIAGNOSIS — N898 Other specified noninflammatory disorders of vagina: Secondary | ICD-10-CM | POA: Diagnosis not present

## 2024-01-06 DIAGNOSIS — N8189 Other female genital prolapse: Secondary | ICD-10-CM | POA: Diagnosis not present

## 2024-01-06 DIAGNOSIS — Z30011 Encounter for initial prescription of contraceptive pills: Secondary | ICD-10-CM | POA: Diagnosis not present

## 2024-01-06 DIAGNOSIS — M5414 Radiculopathy, thoracic region: Secondary | ICD-10-CM | POA: Diagnosis not present

## 2024-01-06 DIAGNOSIS — M5441 Lumbago with sciatica, right side: Secondary | ICD-10-CM | POA: Diagnosis not present

## 2024-01-06 DIAGNOSIS — N393 Stress incontinence (female) (male): Secondary | ICD-10-CM | POA: Diagnosis not present

## 2024-01-06 DIAGNOSIS — M546 Pain in thoracic spine: Secondary | ICD-10-CM | POA: Diagnosis not present

## 2024-01-06 DIAGNOSIS — M5416 Radiculopathy, lumbar region: Secondary | ICD-10-CM | POA: Diagnosis not present

## 2024-01-06 MED ORDER — NORETHINDRONE 0.35 MG PO TABS
1.0000 | ORAL_TABLET | Freq: Every day | ORAL | 3 refills | Status: DC
  Filled 2024-01-06: qty 84, 84d supply, fill #0

## 2024-01-06 MED ORDER — ESTRADIOL 0.1 MG/GM VA CREA
TOPICAL_CREAM | VAGINAL | 3 refills | Status: DC
  Filled 2024-01-06: qty 42.5, 30d supply, fill #0

## 2024-01-13 DIAGNOSIS — M5414 Radiculopathy, thoracic region: Secondary | ICD-10-CM | POA: Diagnosis not present

## 2024-01-31 DIAGNOSIS — M5414 Radiculopathy, thoracic region: Secondary | ICD-10-CM | POA: Diagnosis not present

## 2024-01-31 DIAGNOSIS — M5441 Lumbago with sciatica, right side: Secondary | ICD-10-CM | POA: Diagnosis not present

## 2024-01-31 DIAGNOSIS — G8929 Other chronic pain: Secondary | ICD-10-CM | POA: Diagnosis not present

## 2024-01-31 DIAGNOSIS — M546 Pain in thoracic spine: Secondary | ICD-10-CM | POA: Diagnosis not present

## 2024-01-31 DIAGNOSIS — M5416 Radiculopathy, lumbar region: Secondary | ICD-10-CM | POA: Diagnosis not present

## 2024-02-02 DIAGNOSIS — F32A Depression, unspecified: Secondary | ICD-10-CM | POA: Diagnosis not present

## 2024-02-09 DIAGNOSIS — M5416 Radiculopathy, lumbar region: Secondary | ICD-10-CM | POA: Diagnosis not present

## 2024-02-13 DIAGNOSIS — M79604 Pain in right leg: Secondary | ICD-10-CM | POA: Diagnosis not present

## 2024-02-13 DIAGNOSIS — M546 Pain in thoracic spine: Secondary | ICD-10-CM | POA: Diagnosis not present

## 2024-02-13 DIAGNOSIS — M5451 Vertebrogenic low back pain: Secondary | ICD-10-CM | POA: Diagnosis not present

## 2024-02-21 DIAGNOSIS — M5416 Radiculopathy, lumbar region: Secondary | ICD-10-CM | POA: Diagnosis not present

## 2024-02-21 DIAGNOSIS — M546 Pain in thoracic spine: Secondary | ICD-10-CM | POA: Diagnosis not present

## 2024-02-21 DIAGNOSIS — G8929 Other chronic pain: Secondary | ICD-10-CM | POA: Diagnosis not present

## 2024-02-21 DIAGNOSIS — M5441 Lumbago with sciatica, right side: Secondary | ICD-10-CM | POA: Diagnosis not present

## 2024-02-21 DIAGNOSIS — M5414 Radiculopathy, thoracic region: Secondary | ICD-10-CM | POA: Diagnosis not present

## 2024-02-23 DIAGNOSIS — M5451 Vertebrogenic low back pain: Secondary | ICD-10-CM | POA: Diagnosis not present

## 2024-02-23 DIAGNOSIS — M79604 Pain in right leg: Secondary | ICD-10-CM | POA: Diagnosis not present

## 2024-02-23 DIAGNOSIS — M546 Pain in thoracic spine: Secondary | ICD-10-CM | POA: Diagnosis not present

## 2024-03-08 DIAGNOSIS — M546 Pain in thoracic spine: Secondary | ICD-10-CM | POA: Diagnosis not present

## 2024-03-08 DIAGNOSIS — M79604 Pain in right leg: Secondary | ICD-10-CM | POA: Diagnosis not present

## 2024-03-08 DIAGNOSIS — M5451 Vertebrogenic low back pain: Secondary | ICD-10-CM | POA: Diagnosis not present

## 2024-03-20 DIAGNOSIS — M546 Pain in thoracic spine: Secondary | ICD-10-CM | POA: Diagnosis not present

## 2024-03-20 DIAGNOSIS — M5451 Vertebrogenic low back pain: Secondary | ICD-10-CM | POA: Diagnosis not present

## 2024-03-20 DIAGNOSIS — M79604 Pain in right leg: Secondary | ICD-10-CM | POA: Diagnosis not present

## 2024-03-27 DIAGNOSIS — M5451 Vertebrogenic low back pain: Secondary | ICD-10-CM | POA: Diagnosis not present

## 2024-03-27 DIAGNOSIS — M79604 Pain in right leg: Secondary | ICD-10-CM | POA: Diagnosis not present

## 2024-03-27 DIAGNOSIS — M546 Pain in thoracic spine: Secondary | ICD-10-CM | POA: Diagnosis not present

## 2024-05-14 ENCOUNTER — Ambulatory Visit: Payer: Self-pay | Admitting: Nurse Practitioner

## 2024-05-14 VITALS — BP 100/65 | HR 80 | Temp 98.7°F | Ht 61.5 in | Wt 160.2 lb

## 2024-05-14 DIAGNOSIS — M545 Low back pain, unspecified: Secondary | ICD-10-CM

## 2024-05-14 DIAGNOSIS — R2231 Localized swelling, mass and lump, right upper limb: Secondary | ICD-10-CM
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
## 2024-05-15 ENCOUNTER — Encounter: Payer: Self-pay | Admitting: Nurse Practitioner

## 2024-05-15 ENCOUNTER — Other Ambulatory Visit: Payer: Self-pay

## 2024-05-15 DIAGNOSIS — N6310 Unspecified lump in the right breast, unspecified quadrant: Secondary | ICD-10-CM

## 2024-05-26 NOTE — Progress Notes (Unsigned)
 [name] is a 33 y.o. [phone] female who presents to [facility] clinic today with {Blank single:19197::no complaints,complaint of} ***.    Pap Smear: Pap smear completed today. Last Pap smear was *** at *** clinic and was {Blank single:19197::normal,abnormal - ***}. Per patient has {Blank single:19197::no history,history} of an abnormal Pap smear. Last Pap smear result {Blank single:19197::is available in,is not available in} Epic.   Physical exam: Breasts Breasts symmetrical. No skin abnormalities bilateral breasts. No nipple retraction bilateral breasts. No nipple discharge bilateral breasts. No lymphadenopathy. No lumps palpated bilateral breasts.       Pelvic/Bimanual Ext Genitalia No lesions, no swelling and no discharge observed on external genitalia.        Vagina Vagina pink and normal texture. No lesions or discharge observed in vagina.        Cervix Cervix is present. Cervix pink and of normal texture. No discharge observed.    Uterus Uterus is present and palpable. Uterus in normal position and normal size.        Adnexae Bilateral ovaries present and palpable. No tenderness on palpation.         Rectovaginal No rectal exam completed today since patient had no rectal complaints. No skin abnormalities observed on exam.     Smoking History: Patient has {Blank single:19197::never smoked,is a former smoker,is a current smoker at *** packs per day} ***referred to quit line.    Patient Navigation: Patient education provided. Access to services provided for patient through [facility] program. *** interpreter provided. *** transportation provided   Colorectal Cancer Screening: Per patient {Blank single:19197::has had colonoscopy completed on ***,has never had colonoscopy completed} No complaints today.    Breast and Cervical Cancer Risk Assessment: Patient {Blank single:19197::has,does not have} family history of breast cancer, known genetic  mutations, or radiation treatment to the chest before age 51. Patient {Blank single:19197::has,does not have} history of cervical dysplasia, immunocompromised, or DES exposure in-utero.  Risk Assessment   No risk assessment data     A: BCCCP exam with pap smear Complaint of ***  P: [facility] for a {Blank single:19197::screening,diagnostic} mammogram. Appointment scheduled ***.  Geofm Delon BRAVO, NP 05/26/2024 8:50 PM

## 2024-05-28 ENCOUNTER — Ambulatory Visit: Payer: Self-pay | Attending: Obstetrics and Gynecology | Admitting: *Deleted

## 2024-05-28 VITALS — BP 94/67 | Wt 160.0 lb

## 2024-05-28 DIAGNOSIS — Z1239 Encounter for other screening for malignant neoplasm of breast: Secondary | ICD-10-CM

## 2024-05-31 ENCOUNTER — Ambulatory Visit
Admission: RE | Admit: 2024-05-31 | Discharge: 2024-05-31 | Disposition: A | Discharge status: Home / Self Care | Payer: Self-pay | Source: Ambulatory Visit | Attending: Obstetrics and Gynecology | Admitting: Obstetrics and Gynecology

## 2024-05-31 ENCOUNTER — Other Ambulatory Visit: Payer: Self-pay | Admitting: Obstetrics and Gynecology

## 2024-05-31 DIAGNOSIS — N6310 Unspecified lump in the right breast, unspecified quadrant: Secondary | ICD-10-CM

## 2024-06-01 ENCOUNTER — Other Ambulatory Visit: Payer: Self-pay | Admitting: Obstetrics and Gynecology

## 2024-06-01 DIAGNOSIS — N63 Unspecified lump in unspecified breast: Secondary | ICD-10-CM

## 2024-06-01 DIAGNOSIS — R928 Other abnormal and inconclusive findings on diagnostic imaging of breast: Secondary | ICD-10-CM

## 2024-06-04 ENCOUNTER — Other Ambulatory Visit: Payer: Self-pay | Admitting: Obstetrics and Gynecology

## 2024-06-04 DIAGNOSIS — R928 Other abnormal and inconclusive findings on diagnostic imaging of breast: Secondary | ICD-10-CM

## 2024-06-06 ENCOUNTER — Ambulatory Visit
Admission: RE | Admit: 2024-06-06 | Discharge: 2024-06-06 | Disposition: A | Discharge status: Home / Self Care | Payer: Self-pay | Source: Ambulatory Visit | Attending: Obstetrics and Gynecology | Admitting: Obstetrics and Gynecology

## 2024-06-06 DIAGNOSIS — N63 Unspecified lump in unspecified breast: Secondary | ICD-10-CM | POA: Insufficient documentation

## 2024-06-06 DIAGNOSIS — R928 Other abnormal and inconclusive findings on diagnostic imaging of breast: Secondary | ICD-10-CM | POA: Insufficient documentation

## 2024-06-06 MED ORDER — LIDOCAINE 1 % OPTIME INJ - NO CHARGE
5.0000 mL | Freq: Once | INTRAMUSCULAR | Status: AC
  Administered 2024-06-06: 5 mL
  Filled 2024-06-06: qty 6

## 2024-06-07 LAB — SURGICAL PATHOLOGY

## 2024-06-11 ENCOUNTER — Encounter: Payer: Self-pay | Admitting: Radiology

## 2024-08-14 ENCOUNTER — Encounter: Payer: Self-pay | Admitting: Nurse Practitioner

## 2024-09-04 ENCOUNTER — Ambulatory Visit (INDEPENDENT_AMBULATORY_CARE_PROVIDER_SITE_OTHER): Payer: Self-pay | Admitting: Nurse Practitioner

## 2024-09-04 VITALS — BP 110/80 | HR 81 | Temp 98.0°F | Ht 61.5 in | Wt 156.4 lb

## 2024-09-04 DIAGNOSIS — G8929 Other chronic pain: Secondary | ICD-10-CM

## 2024-09-04 DIAGNOSIS — M545 Low back pain, unspecified: Secondary | ICD-10-CM

## 2024-09-04 DIAGNOSIS — Z0289 Encounter for other administrative examinations: Secondary | ICD-10-CM
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
